# Patient Record
Sex: Female | Born: 1940 | Race: White | Hispanic: No | Marital: Married | Smoking: Former smoker
Health system: Southern US, Community
[De-identification: ages and names within clinical notes are randomized; demographics above are authoritative.]

## PROBLEM LIST (undated history)

## (undated) DIAGNOSIS — D649 Anemia, unspecified: Secondary | ICD-10-CM

## (undated) DIAGNOSIS — Z9109 Other allergy status, other than to drugs and biological substances: Secondary | ICD-10-CM

## (undated) DIAGNOSIS — M069 Rheumatoid arthritis, unspecified: Secondary | ICD-10-CM

## (undated) DIAGNOSIS — M1990 Unspecified osteoarthritis, unspecified site: Secondary | ICD-10-CM

## (undated) DIAGNOSIS — T4145XA Adverse effect of unspecified anesthetic, initial encounter: Secondary | ICD-10-CM

## (undated) DIAGNOSIS — T8859XA Other complications of anesthesia, initial encounter: Secondary | ICD-10-CM

## (undated) DIAGNOSIS — I7300 Raynaud's syndrome without gangrene: Secondary | ICD-10-CM

## (undated) DIAGNOSIS — Z923 Personal history of irradiation: Secondary | ICD-10-CM

## (undated) DIAGNOSIS — R112 Nausea with vomiting, unspecified: Secondary | ICD-10-CM

## (undated) DIAGNOSIS — E7800 Pure hypercholesterolemia, unspecified: Secondary | ICD-10-CM

## (undated) DIAGNOSIS — Z9889 Other specified postprocedural states: Secondary | ICD-10-CM

## (undated) DIAGNOSIS — C801 Malignant (primary) neoplasm, unspecified: Secondary | ICD-10-CM

## (undated) HISTORY — PX: EYE SURGERY: SHX253

## (undated) HISTORY — PX: REPLACEMENT TOTAL KNEE: SUR1224

## (undated) HISTORY — PX: COLONOSCOPY: SHX174

## (undated) HISTORY — PX: SHOULDER ARTHROSCOPY: SHX128

## (undated) HISTORY — PX: BREAST LUMPECTOMY: SHX2

---
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
## 1966-02-21 HISTORY — PX: GANGLION CYST EXCISION: SHX1691

## 1968-02-22 HISTORY — PX: SYMPATHECTOMY: SHX792

## 1970-02-21 HISTORY — PX: ABDOMINAL HYSTERECTOMY: SHX81

## 1997-02-21 HISTORY — PX: BUNIONECTOMY: SHX129

## 2006-08-18 DIAGNOSIS — K589 Irritable bowel syndrome without diarrhea: Secondary | ICD-10-CM

## 2006-08-18 DIAGNOSIS — I7300 Raynaud's syndrome without gangrene: Secondary | ICD-10-CM

## 2006-08-18 HISTORY — DX: Irritable bowel syndrome, unspecified: K58.9

## 2006-08-18 HISTORY — DX: Raynaud's syndrome without gangrene: I73.00

## 2007-07-31 DIAGNOSIS — J309 Allergic rhinitis, unspecified: Secondary | ICD-10-CM

## 2007-07-31 HISTORY — DX: Allergic rhinitis, unspecified: J30.9

## 2008-09-19 DIAGNOSIS — I10 Essential (primary) hypertension: Secondary | ICD-10-CM | POA: Insufficient documentation

## 2008-09-19 HISTORY — DX: Essential (primary) hypertension: I10

## 2009-04-22 DIAGNOSIS — E785 Hyperlipidemia, unspecified: Secondary | ICD-10-CM

## 2009-04-22 HISTORY — DX: Hyperlipidemia, unspecified: E78.5

## 2010-02-02 DIAGNOSIS — G4700 Insomnia, unspecified: Secondary | ICD-10-CM

## 2010-02-02 HISTORY — DX: Insomnia, unspecified: G47.00

## 2012-10-30 ENCOUNTER — Emergency Department (HOSPITAL_BASED_OUTPATIENT_CLINIC_OR_DEPARTMENT_OTHER)
Admission: EM | Admit: 2012-10-30 | Discharge: 2012-10-30 | Disposition: A | Discharge status: Home / Self Care | Payer: Medicare Other | Attending: Emergency Medicine | Admitting: Emergency Medicine

## 2012-10-30 ENCOUNTER — Encounter (HOSPITAL_BASED_OUTPATIENT_CLINIC_OR_DEPARTMENT_OTHER): Payer: Self-pay

## 2012-10-30 DIAGNOSIS — E7800 Pure hypercholesterolemia, unspecified: Secondary | ICD-10-CM | POA: Insufficient documentation

## 2012-10-30 DIAGNOSIS — R110 Nausea: Secondary | ICD-10-CM | POA: Insufficient documentation

## 2012-10-30 DIAGNOSIS — Z79899 Other long term (current) drug therapy: Secondary | ICD-10-CM | POA: Insufficient documentation

## 2012-10-30 DIAGNOSIS — E876 Hypokalemia: Secondary | ICD-10-CM | POA: Insufficient documentation

## 2012-10-30 DIAGNOSIS — I10 Essential (primary) hypertension: Secondary | ICD-10-CM | POA: Insufficient documentation

## 2012-10-30 DIAGNOSIS — R1031 Right lower quadrant pain: Secondary | ICD-10-CM | POA: Insufficient documentation

## 2012-10-30 DIAGNOSIS — Z8739 Personal history of other diseases of the musculoskeletal system and connective tissue: Secondary | ICD-10-CM | POA: Insufficient documentation

## 2012-10-30 DIAGNOSIS — Z87891 Personal history of nicotine dependence: Secondary | ICD-10-CM | POA: Insufficient documentation

## 2012-10-30 DIAGNOSIS — R1013 Epigastric pain: Secondary | ICD-10-CM | POA: Insufficient documentation

## 2012-10-30 DIAGNOSIS — Z880 Allergy status to penicillin: Secondary | ICD-10-CM | POA: Insufficient documentation

## 2012-10-30 HISTORY — DX: Other allergy status, other than to drugs and biological substances: Z91.09

## 2012-10-30 HISTORY — DX: Unspecified osteoarthritis, unspecified site: M19.90

## 2012-10-30 HISTORY — DX: Raynaud's syndrome without gangrene: I73.00

## 2012-10-30 HISTORY — DX: Pure hypercholesterolemia, unspecified: E78.00

## 2012-10-30 HISTORY — DX: Rheumatoid arthritis, unspecified: M06.9

## 2012-10-30 MED ORDER — POTASSIUM CHLORIDE 10 MEQ/100ML IV SOLN
10.0000 meq | Freq: Once | INTRAVENOUS | Status: AC
Start: 2012-10-30 — End: 2012-10-30
  Administered 2012-10-30: 10 meq via INTRAVENOUS
  Filled 2012-10-30: qty 100

## 2012-10-30 MED ORDER — POTASSIUM CHLORIDE CRYS ER 20 MEQ PO TBCR
40.0000 meq | EXTENDED_RELEASE_TABLET | Freq: Once | ORAL | Status: AC
  Administered 2012-10-30: 40 meq via ORAL
  Filled 2012-10-30: qty 2

## 2012-10-30 NOTE — ED Notes (Signed)
Pt sent by PCP for low potassium level. Results not reported. Pt states she was experiencing cramping in her stomach yesterday and went to her PCP.

## 2012-10-30 NOTE — ED Provider Notes (Signed)
CSN: [id number]     Arrival date & time 10/30/12  1149 History   First MD Initiated Contact with Patient 10/30/12 1152     Chief Complaint  Patient presents with  . Abnormal Lab    Low potassium   (Consider location/radiation/quality/duration/timing/severity/associated sxs/prior Treatment) HPI Pt reports she had moderate aching upper abdomen pain and nausea about 3 days ago, seen at PCP office yesterday and had labs and US done. States she is feeling better today. She was called and told her potassium was low and she needed to go to the ED. She denies any weakness, no vomiting since 2 days ago. No diarrhea or dysuria. She recently had a 'cystitis' and took erythromycin she had left over from previous knee operation/infection. Denies any fever. Has had poor appetite.  Past Medical History  Diagnosis Date  . Rheumatoid arthritis   . Osteoarthritis   . Raynaud disease   . High cholesterol   . Hypertension   . Environmental allergies    Past Surgical History  Procedure Laterality Date  . Tonsillectomy    . Appendectomy    . Ganglion cyst excision    . Sympathectomy    . Abdominal hysterectomy    . Bunionectomy    . Replacement total knee      Right and Left   Family History  Problem Relation Age of Onset  . Adopted: Yes   History  Substance Use Topics  . Smoking status: Former Games developer  . Smokeless tobacco: Never Used  . Alcohol Use: 0.6 oz/week    1 Cans of beer per week     Comment: per day   OB History   Grav Para Term Preterm Abortions TAB SAB Ect Mult Living                 Review of Systems All other systems reviewed and are negative except as noted in HPI.   Allergies  Penicillins  Home Medications   Current Outpatient Rx  Name  Route  Sig  Dispense  Refill  . atorvastatin (LIPITOR) 20 MG tablet   Oral   Take 20 mg by mouth daily.         . hydrochlorothiazide (HYDRODIURIL) 25 MG tablet   Oral   Take 25 mg by mouth daily.         Marland Kitchen NIFEdipine  (PROCARDIA) 20 MG capsule   Oral   Take 20 mg by mouth 3 (three) times daily.         Marland Kitchen omeprazole (PRILOSEC) 20 MG capsule   Oral   Take 20 mg by mouth daily.          BP 137/63  Pulse 94  Temp(Src) 97.5 F (36.4 C) (Oral)  Ht 5\' 7"  (1.702 m)  Wt 128 lb (58.06 kg)  BMI 20.04 kg/m2  SpO2 100% Physical Exam  Nursing note and vitals reviewed. Constitutional: She is oriented to person, place, and time. She appears well-developed and well-nourished.  HENT:  Head: Normocephalic and atraumatic.  Eyes: EOM are normal. Pupils are equal, round, and reactive to light.  Neck: Normal range of motion. Neck supple.  Cardiovascular: Normal rate, normal heart sounds and intact distal pulses.   Pulmonary/Chest: Effort normal and breath sounds normal.  Abdominal: Bowel sounds are normal. She exhibits no distension. There is tenderness (mild epigastric and RLQ, no peritoneal signs). There is no rebound and no guarding.  Musculoskeletal: Normal range of motion. She exhibits no edema and no tenderness.  Neurological:  She is alert and oriented to person, place, and time. She has normal strength. No cranial nerve deficit or sensory deficit.  Skin: Skin is warm and dry. No rash noted.  Psychiatric: She has a normal mood and affect.    ED Course  Procedures (including critical care time) Labs Review Labs Reviewed - No data to display Imaging Review No results found.  MDM   1. Hypokalemia     Labs from PCP office sent over and K was 2.8, recommended coming to the ED for "replacement in supervised setting" EKG reviewed and normal. Will give PO and IV. Plan for discharge afterwards, patient has PCP appointment in 2 days. [place] reportedly normal per notes from PCP office.    Date: 10/30/2012  Rate: 69  Rhythm: normal sinus rhythm  QRS Axis: normal  Intervals: normal  ST/T Wave abnormalities: normal  Conduction Disutrbances:none  Narrative Interpretation:   Old EKG Reviewed:  none available     Charles B. Bernette Mayers, MD 10/30/12 1430

## 2012-10-30 NOTE — ED Notes (Signed)
EDP Bernette Mayers reviewed pt's lab results from yesterday-states K+ 2.8

## 2013-11-26 DIAGNOSIS — S32010D Wedge compression fracture of first lumbar vertebra, subsequent encounter for fracture with routine healing: Secondary | ICD-10-CM

## 2013-11-26 HISTORY — DX: Wedge compression fracture of first lumbar vertebra, subsequent encounter for fracture with routine healing: S32.010D

## 2013-12-12 ENCOUNTER — Other Ambulatory Visit: Payer: Self-pay

## 2013-12-12 DIAGNOSIS — Z1231 Encounter for screening mammogram for malignant neoplasm of breast: Secondary | ICD-10-CM

## 2013-12-13 ENCOUNTER — Other Ambulatory Visit: Payer: Self-pay | Admitting: Family Medicine

## 2013-12-13 DIAGNOSIS — S32010A Wedge compression fracture of first lumbar vertebra, initial encounter for closed fracture: Secondary | ICD-10-CM

## 2014-02-07 ENCOUNTER — Ambulatory Visit
Admission: RE | Admit: 2014-02-07 | Discharge: 2014-02-07 | Disposition: A | Discharge status: Home / Self Care | Payer: Medicare Other | Source: Ambulatory Visit | Attending: Family Medicine | Admitting: Family Medicine

## 2014-02-07 ENCOUNTER — Ambulatory Visit
Admission: RE | Admit: 2014-02-07 | Discharge: 2014-02-07 | Disposition: A | Discharge status: Home / Self Care | Payer: Medicare Other | Source: Ambulatory Visit

## 2014-02-07 ENCOUNTER — Encounter (INDEPENDENT_AMBULATORY_CARE_PROVIDER_SITE_OTHER): Payer: Self-pay

## 2014-02-07 DIAGNOSIS — Z1231 Encounter for screening mammogram for malignant neoplasm of breast: Secondary | ICD-10-CM

## 2014-02-07 DIAGNOSIS — S32010A Wedge compression fracture of first lumbar vertebra, initial encounter for closed fracture: Secondary | ICD-10-CM

## 2014-12-15 DIAGNOSIS — C4491 Basal cell carcinoma of skin, unspecified: Secondary | ICD-10-CM

## 2014-12-15 HISTORY — DX: Basal cell carcinoma of skin, unspecified: C44.91

## 2014-12-17 ENCOUNTER — Other Ambulatory Visit: Payer: Self-pay

## 2014-12-17 DIAGNOSIS — Z1231 Encounter for screening mammogram for malignant neoplasm of breast: Secondary | ICD-10-CM

## 2015-02-10 ENCOUNTER — Ambulatory Visit
Admission: RE | Admit: 2015-02-10 | Discharge: 2015-02-10 | Disposition: A | Discharge status: Home / Self Care | Payer: Medicare Other | Source: Ambulatory Visit

## 2015-02-10 DIAGNOSIS — Z1231 Encounter for screening mammogram for malignant neoplasm of breast: Secondary | ICD-10-CM

## 2015-02-22 HISTORY — PX: JOINT REPLACEMENT: SHX530

## 2015-02-27 DIAGNOSIS — M4726 Other spondylosis with radiculopathy, lumbar region: Secondary | ICD-10-CM

## 2015-02-27 DIAGNOSIS — M5136 Other intervertebral disc degeneration, lumbar region: Secondary | ICD-10-CM

## 2015-02-27 DIAGNOSIS — M48061 Spinal stenosis, lumbar region without neurogenic claudication: Secondary | ICD-10-CM

## 2015-02-27 DIAGNOSIS — M51369 Other intervertebral disc degeneration, lumbar region without mention of lumbar back pain or lower extremity pain: Secondary | ICD-10-CM

## 2015-02-27 HISTORY — DX: Other intervertebral disc degeneration, lumbar region: M51.36

## 2015-02-27 HISTORY — DX: Other intervertebral disc degeneration, lumbar region without mention of lumbar back pain or lower extremity pain: M51.369

## 2015-02-27 HISTORY — DX: Other spondylosis with radiculopathy, lumbar region: M47.26

## 2015-02-27 HISTORY — DX: Spinal stenosis, lumbar region without neurogenic claudication: M48.061

## 2015-03-03 DIAGNOSIS — Z96612 Presence of left artificial shoulder joint: Secondary | ICD-10-CM

## 2015-03-03 HISTORY — DX: Presence of left artificial shoulder joint: Z96.612

## 2015-12-28 ENCOUNTER — Other Ambulatory Visit: Payer: Self-pay | Admitting: Family Medicine

## 2015-12-28 DIAGNOSIS — Z1231 Encounter for screening mammogram for malignant neoplasm of breast: Secondary | ICD-10-CM

## 2016-02-11 ENCOUNTER — Ambulatory Visit
Admission: RE | Admit: 2016-02-11 | Discharge: 2016-02-11 | Disposition: A | Discharge status: Home / Self Care | Payer: Medicare Other | Source: Ambulatory Visit | Attending: Family Medicine | Admitting: Family Medicine

## 2016-02-11 DIAGNOSIS — Z1231 Encounter for screening mammogram for malignant neoplasm of breast: Secondary | ICD-10-CM

## 2016-12-29 ENCOUNTER — Other Ambulatory Visit: Payer: Self-pay | Admitting: Family Medicine

## 2016-12-29 DIAGNOSIS — Z780 Asymptomatic menopausal state: Secondary | ICD-10-CM

## 2017-01-16 ENCOUNTER — Other Ambulatory Visit: Payer: Self-pay | Admitting: Family Medicine

## 2017-01-16 DIAGNOSIS — Z139 Encounter for screening, unspecified: Secondary | ICD-10-CM

## 2017-01-21 DIAGNOSIS — C801 Malignant (primary) neoplasm, unspecified: Secondary | ICD-10-CM

## 2017-01-21 HISTORY — DX: Malignant (primary) neoplasm, unspecified: C80.1

## 2017-02-21 HISTORY — PX: BREAST LUMPECTOMY: SHX2

## 2017-02-24 ENCOUNTER — Other Ambulatory Visit: Payer: Self-pay | Admitting: Family Medicine

## 2017-02-24 DIAGNOSIS — E2839 Other primary ovarian failure: Secondary | ICD-10-CM

## 2017-02-27 ENCOUNTER — Ambulatory Visit
Admission: RE | Admit: 2017-02-27 | Discharge: 2017-02-27 | Disposition: A | Discharge status: Home / Self Care | Payer: Medicare Other | Source: Ambulatory Visit | Attending: Family Medicine | Admitting: Family Medicine

## 2017-02-27 DIAGNOSIS — Z139 Encounter for screening, unspecified: Secondary | ICD-10-CM

## 2017-02-27 DIAGNOSIS — E2839 Other primary ovarian failure: Secondary | ICD-10-CM

## 2017-02-28 ENCOUNTER — Other Ambulatory Visit: Payer: Self-pay | Admitting: Family Medicine

## 2017-02-28 DIAGNOSIS — R928 Other abnormal and inconclusive findings on diagnostic imaging of breast: Secondary | ICD-10-CM

## 2017-03-02 ENCOUNTER — Other Ambulatory Visit: Payer: Medicare Other

## 2017-03-03 ENCOUNTER — Ambulatory Visit
Admission: RE | Admit: 2017-03-03 | Discharge: 2017-03-03 | Disposition: A | Discharge status: Home / Self Care | Payer: Medicare Other | Source: Ambulatory Visit | Attending: Family Medicine | Admitting: Family Medicine

## 2017-03-03 ENCOUNTER — Other Ambulatory Visit: Payer: Self-pay | Admitting: Family Medicine

## 2017-03-03 DIAGNOSIS — R928 Other abnormal and inconclusive findings on diagnostic imaging of breast: Secondary | ICD-10-CM

## 2017-03-03 DIAGNOSIS — M8580 Other specified disorders of bone density and structure, unspecified site: Secondary | ICD-10-CM | POA: Insufficient documentation

## 2017-03-03 HISTORY — DX: Other specified disorders of bone density and structure, unspecified site: M85.80

## 2017-03-08 ENCOUNTER — Ambulatory Visit
Admission: RE | Admit: 2017-03-08 | Discharge: 2017-03-08 | Disposition: A | Discharge status: Home / Self Care | Payer: Medicare Other | Source: Ambulatory Visit | Attending: Family Medicine | Admitting: Family Medicine

## 2017-03-08 ENCOUNTER — Other Ambulatory Visit: Payer: Self-pay | Admitting: Family Medicine

## 2017-03-08 DIAGNOSIS — R928 Other abnormal and inconclusive findings on diagnostic imaging of breast: Secondary | ICD-10-CM

## 2017-03-13 ENCOUNTER — Other Ambulatory Visit: Payer: Self-pay | Admitting: Surgery

## 2017-03-13 ENCOUNTER — Ambulatory Visit: Payer: Self-pay | Admitting: Surgery

## 2017-03-13 DIAGNOSIS — Z96653 Presence of artificial knee joint, bilateral: Secondary | ICD-10-CM

## 2017-03-13 DIAGNOSIS — C50912 Malignant neoplasm of unspecified site of left female breast: Secondary | ICD-10-CM

## 2017-03-13 HISTORY — DX: Presence of artificial knee joint, bilateral: Z96.653

## 2017-03-13 NOTE — H&P (Signed)
Heidi Lutz Documented: 03/13/2017 2:23 PM Location: [facility] Patient #: [id number] DOB: 07/01/1940 Married / Language: [name] / Race: White Female  History of Present Illness Marcello Moores A. Kialee Kham MD; 03/13/2017 3:00 PM) Patient words: Patient sent at the request of Dr. Dorise Bullion for mammographic abnormality of left breast. She was noted to have an area in the left breast upper quadrant with increased density. 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 Receptors are pending currently. Patient denies history of breast pain, nipple discharge or breast mass bilaterally. No family history of breast cancer.            CLINICAL DATA: Screening recall for possible mass in the left breast.  EXAM: 2D DIGITAL DIAGNOSTIC LEFT MAMMOGRAM WITH CAD AND ADJUNCT TOMO  ULTRASOUND LEFT BREAST  COMPARISON: Previous exam(s).  ACR Breast Density Category c: The breast tissue is heterogeneously dense, which may obscure small masses.  FINDINGS: In the posterior aspect of the left breast, best seen on the MLO view, there is a focal irregular opacity that persists on the spot-compression 2D images. It is more discretely seen on the spot-compression MLO 3D images where its posterior margin appears spiculated. It measures 5-6 mm in size. It projects laterally.  Mammographic images were processed with CAD.  On physical exam, no discrete mass is palpated in the lateral left breast.  Targeted ultrasound is performed, showing a hypoechoic oval mass with partly ill-defined margins in the left breast at the 3:30 o'clock position, 5 cm the nipple, measuring 8 x 5 x 7 mm, consistent in size, shape and location to the mammographic finding. There is an adjacent simple appearing cyst.  Sonographic evaluation of the left axilla shows no enlarged or abnormal lymph nodes.  IMPRESSION: 1.  Small left breast mass suspicious for breast malignancy. Biopsy is indicated.  RECOMMENDATION: Ultrasound-guided core needle biopsy of the small lateral left breast mass.  I have discussed the findings and recommendations with the patient. Results were also provided in writing at the conclusion of the visit. If applicable, a reminder letter will be sent to the patient regarding the next appointment.  BI-RADS CATEGORY 4: Suspicious.   Electronically Signed By: [name] M.D. On: 03/03/2017 14:23      ADDITIONAL INFORMATION: E-cadherin is positive supporting a ductal phenotype. [name] MD Pathologist, Electronic Signature ( Signed 03/10/2017) FINAL DIAGNOSIS Diagnosis Breast, left, needle core biopsy, lower outer - INVASIVE MAMMARY CARCINOMA, SEE COMMENT. Microscopic Comment The carcinoma appears grade 1. E-cadherin will be ordered. Prognostic markers will be ordered. Dr. Lyndon Code has reviewed the case. The case was called to [facility] on 03/09/2017. [name] MD Pathologist, Electronic Signature (Case signed 03/09/2017) Specimen Gross and Clinical Information Specimen Comment Time in formalin: 4:10 PM; extracted less than 1 minute; mass Specimen(s) Obtained: Breast, left, needle core biopsy, lower outer Specimen Clinical Information.  The patient is a 77 year old female.   Past Surgical History ([name], [place]; 03/13/2017 2:23 PM) Appendectomy Breast Biopsy Bilateral. Cataract Surgery Bilateral. Foot Surgery Left. Hysterectomy (not due to cancer) - Complete Knee Surgery Bilateral. Shoulder Surgery Left. Spinal Surgery - Lower Back Tonsillectomy  Diagnostic Studies History ([name], [place]; 03/13/2017 2:23 PM) Colonoscopy 1-5 years ago Mammogram within last year Pap Smear >5 years ago  Allergies ([name], [place]; 03/13/2017 2:25 PM) Penicillins All Cillins Allergies Reconciled  Medication  History [name]  [name], [name]; 03/13/2017 2:28 PM) Atorvastatin Calcium (20MG  Tablet, Oral) Active. NIFEdipine ER (90MG  Tablet ER 24HR, Oral) Active. Centrum Women (Oral) Active. Calcium (250MG  Tablet, Oral) Active. Magnesium (250MG  Tablet, Oral) Active. Black Cohosh (540MG  Capsule, Oral) Active. ZyrTEC Allergy (10MG  Tablet, Oral) Active. Probiotic (Oral) Active. Medications Reconciled  Social History ([name], [name]; 03/13/2017 2:23 PM) Alcohol use Moderate alcohol use. Caffeine use Carbonated beverages, Coffee. No drug use Tobacco use Former smoker.  Family History ([name], [place]; 03/13/2017 2:23 PM) Family history unknown First Degree Relatives  Pregnancy / Birth History ([name], [place]; 03/13/2017 2:23 PM) Age at menarche 18 years. Age of menopause <44 Gravida 2 Maternal age 96-20 Para 2  Other Problems ([name], [place]; 03/13/2017 2:23 PM) Arthritis Back Pain Breast Cancer Lump In Breast Melanoma     Review of Systems (Tanisha A. Brown [name]; 03/13/2017 2:23 PM) General Not Present- Appetite Loss, Chills, Fatigue, Fever, Night Sweats, Weight Gain and Weight Loss. Skin Not Present- Change in Wart/Mole, Dryness, Hives, Jaundice, New Lesions, Non-Healing Wounds, Rash and Ulcer. HEENT Not Present- Earache, Hearing Loss, Hoarseness, Nose Bleed, Oral Ulcers, Ringing in the Ears, Seasonal Allergies, Sinus Pain, Sore Throat, Visual Disturbances, Wears glasses/contact lenses and Yellow Eyes. Respiratory Not Present- Bloody sputum, Chronic Cough, Difficulty Breathing, Snoring and Wheezing. Cardiovascular Not Present- Chest Pain, Difficulty Breathing Lying Down, Leg Cramps, Palpitations, Rapid Heart Rate, Shortness of Breath and Swelling of Extremities. Gastrointestinal Not Present- Abdominal Pain, Bloating, Bloody Stool, Change in Bowel Habits, Chronic diarrhea, Constipation, Difficulty Swallowing, Excessive gas, Gets full quickly  at meals, Hemorrhoids, Indigestion, Nausea, Rectal Pain and Vomiting. Female Genitourinary Not Present- Frequency, Nocturia, Painful Urination, Pelvic Pain and Urgency. Musculoskeletal Present- Back Pain and Joint Pain. Not Present- Joint Stiffness, Muscle Pain, Muscle Weakness and Swelling of Extremities. Neurological Not Present- Decreased Memory, Fainting, Headaches, Numbness, Seizures, Tingling, Tremor, Trouble walking and Weakness. Psychiatric Not Present- Anxiety, Bipolar, Change in Sleep Pattern, Depression, Fearful and Frequent crying. Endocrine Not Present- Cold Intolerance, Excessive Hunger, Hair Changes, Heat Intolerance, Hot flashes and New Diabetes. Hematology Not Present- Blood Thinners, Easy Bruising, Excessive bleeding, Gland problems, HIV and Persistent Infections.  Vitals (Tanisha A. Brown [name]; 03/13/2017 2:25 PM) 03/13/2017 2:24 PM Weight: 137.8 lb Height: 63in Body Surface Area: 1.65 m Body Mass Index: 24.41 kg/m  Temp.: 97.69F  Pulse: 78 (Regular)  BP: 128/82 (Sitting, Left Arm, Standard)      Physical Exam (Hank Walling A. Travonta Gill MD; 03/13/2017 3:01 PM)  General Mental Status-Alert. General Appearance-Consistent with stated age. Hydration-Well hydrated. Voice-Normal.  Head and Neck Head-normocephalic, atraumatic with no lesions or palpable masses. Trachea-midline. Thyroid Gland Characteristics - normal size and consistency.  Chest and Lung Exam Chest and lung exam reveals -quiet, even and easy respiratory effort with no use of accessory muscles and on auscultation, normal breath sounds, no adventitious sounds and normal vocal resonance. Inspection Chest Wall - Normal. Back - normal.  Breast Breast - Left-Symmetric, Non Tender, No Biopsy scars, no Dimpling, No Inflammation, No Lumpectomy scars, No Mastectomy scars, No Peau d' Orange. Breast - Right-Symmetric, Non Tender, No Biopsy scars, no Dimpling, No Inflammation, No Lumpectomy  scars, No Mastectomy scars, No Peau d' Orange. Breast Lump-No Palpable Breast Mass. Note: Steri-Strips left breast noted  Neurologic Neurologic evaluation reveals -alert and oriented x 3 with no impairment of recent or remote memory. Mental Status-Normal.  Musculoskeletal Normal Exam - Left-Upper Extremity Strength Normal and Lower Extremity Strength Normal. Normal Exam - Right-Upper Extremity Strength  Normal and Lower Extremity Strength Normal.  Lymphatic Head & Neck  General Head & Neck Lymphatics: Bilateral - Description - Normal. Axillary  General Axillary Region: Bilateral - Description - Normal. Tenderness - Non Tender.    Assessment & Plan (Merwyn Hodapp A. Annebelle Bostic MD; 03/13/2017 2:59 PM)  BREAST CANCER, LEFT (C50.912) Impression: Upper-outer quadrant  Discussed lumpectomy versus mastectomy reconstruction. Patient has opted for breast conservation. Discussed CT localization of left breast lumpectomy and sentinel lymph node mapping as well to pros and cons the controversies currently with women age 20. After discussion of pros and cons of sentinel lymph node mapping, she agreed to proceed to that as well as lumpectomy. Risk of lumpectomy include bleeding, infection, seroma, more surgery, use of seed/wire, wound care, cosmetic deformity and the need for other treatments, death , blood clots, death. Pt agrees to proceed. Risk of sentinel lymph node mapping include bleeding, infection, lymphedema, shoulder pain. stiffness, dye allergy. cosmetic deformity , blood clots, death, need for more surgery. Pt agres to proceed.  Current Plans You are being scheduled for surgery- Our schedulers will call you.  You should hear from our office's scheduling department within 5 working days about the location, date, and time of surgery. We try to make accommodations for patient's preferences in scheduling surgery, but sometimes the OR schedule or the surgeon's schedule prevents [place] from  making those accommodations.  If you have not heard from our office [phone]) in 5 working days, call the office and ask for your surgeon's nurse.  If you have other questions about your diagnosis, plan, or surgery, call the office and ask for your surgeon's nurse.  Pt Education - [facility] Breast Cancer Information Given - [facility] "Breast Journey" Package We discussed the staging and pathophysiology of breast cancer. We discussed all of the different options for treatment for breast cancer including surgery, chemotherapy, radiation therapy, Herceptin, and antiestrogen therapy. We discussed a sentinel lymph node biopsy as she does not appear to having lymph node involvement right now. We discussed the performance of that with injection of radioactive tracer and blue dye. We discussed that she would have an incision underneath her axillary hairline. We discussed that there is a bout a 10-20% chance of having a positive node with a sentinel lymph node biopsy and we will await the permanent pathology to make any other first further decisions in terms of her treatment. One of these options might be to return to the operating room to perform an axillary lymph node dissection. We discussed about a 1-2% risk lifetime of chronic shoulder pain as well as lymphedema associated with a sentinel lymph node biopsy. We discussed the options for treatment of the breast cancer which included lumpectomy versus a mastectomy. We discussed the performance of the lumpectomy with a wire placement. We discussed a 10-20% chance of a positive margin requiring reexcision in the operating room. We also discussed that she may need radiation therapy or antiestrogen therapy or both if she undergoes lumpectomy. We discussed the mastectomy and the postoperative care for that as well. We discussed that there is no difference in her survival whether she undergoes lumpectomy with radiation therapy or antiestrogen therapy versus a mastectomy.  There is a slight difference in the local recurrence rate being 3-5% with lumpectomy and about 1% with a mastectomy. We discussed the risks of operation including bleeding, infection, possible reoperation. She understands her further therapy will be based on what her stages at the time of her operation.  Pt Education -  [facility] breast cancer surgery: discussed with patient and provided information. Pt Education - [facility] Breast Biopsy HCI: discussed with patient and provided information.

## 2017-03-13 NOTE — H&P (View-Only) (Signed)
Heidi Lutz Documented: 03/13/2017 2:23 PM Location: [facility] Patient #: [id number] DOB: 22-May-1940 Married / Language: [name] / Race: White Female  History of Present Illness Heidi Lutz A. Heidi Lutz Matera Lutz; 03/13/2017 3:00 PM) Patient words: Patient sent at Heidi request of Dr. Dorise Lutz for mammographic abnormality of left breast. She was noted to have an area in Heidi left breast upper quadrant with increased density. 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 Receptors are pending currently. Patient denies history of breast pain, nipple discharge or breast mass bilaterally. No family history of breast cancer.            CLINICAL DATA: Screening recall for possible mass in Heidi left breast.  EXAM: 2D DIGITAL DIAGNOSTIC LEFT MAMMOGRAM WITH CAD AND ADJUNCT TOMO  ULTRASOUND LEFT BREAST  COMPARISON: Previous exam(s).  ACR Breast Density Category c: Heidi breast tissue is heterogeneously dense, which may obscure small masses.  FINDINGS: In Heidi posterior aspect of Heidi left breast, best seen on Heidi MLO view, there is a focal irregular opacity that persists on Heidi spot-compression 2D images. It is more discretely seen on Heidi spot-compression MLO 3D images where its posterior margin appears spiculated. It measures 5-6 mm in size. It projects laterally.  Mammographic images were processed with CAD.  On physical exam, no discrete mass is palpated in Heidi lateral left breast.  Targeted ultrasound is performed, showing a hypoechoic oval mass with partly ill-defined margins in Heidi left breast at Heidi 3:30 o'clock position, 5 cm Heidi nipple, measuring 8 x 5 x 7 mm, consistent in size, shape and location to Heidi mammographic finding. There is an adjacent simple appearing cyst.  Sonographic evaluation of Heidi left axilla shows no enlarged or abnormal lymph nodes.  IMPRESSION: 1.  Small left breast mass suspicious for breast malignancy. Biopsy is indicated.  RECOMMENDATION: Ultrasound-guided core needle biopsy of Heidi small lateral left breast mass.  I have discussed Heidi findings and recommendations with Heidi patient. Results were also provided in writing at Heidi conclusion of Heidi visit. If applicable, a reminder letter will be sent to Heidi patient regarding Heidi next appointment.  BI-RADS CATEGORY 4: Suspicious.   Electronically Signed By: Heidi Lutz M.D. On: 03/03/2017 14:23      ADDITIONAL INFORMATION: E-cadherin is positive supporting a ductal phenotype. Heidi Lutz Pathologist, Electronic Signature ( Signed 03/10/2017) FINAL DIAGNOSIS Diagnosis Breast, left, needle core biopsy, lower outer - INVASIVE MAMMARY CARCINOMA, SEE COMMENT. Microscopic Comment Heidi carcinoma appears grade 1. E-cadherin will be ordered. Prognostic markers will be ordered. Dr. Lyndon Lutz has reviewed Heidi case. Heidi case was called to Heidi Lutz on 03/09/2017. Heidi Lutz Pathologist, Electronic Signature (Case signed 03/09/2017) Specimen Gross and Clinical Information Specimen Comment Time in formalin: 4:10 PM; extracted less than 1 minute; mass Specimen(s) Obtained: Breast, left, needle core biopsy, lower outer Specimen Clinical Information.  Heidi patient is a 77 year old female.   Past Surgical History (Heidi Lutz, [place]; 03/13/2017 2:23 PM) Appendectomy Breast Biopsy Bilateral. Cataract Surgery Bilateral. Foot Surgery Left. Hysterectomy (not due to cancer) - Complete Knee Surgery Bilateral. Shoulder Surgery Left. Spinal Surgery - Lower Back Tonsillectomy  Diagnostic Studies History (Heidi Lutz, [place]; 03/13/2017 2:23 PM) Colonoscopy 1-5 years ago Mammogram within last year Pap Smear >5 years ago  Allergies (Heidi Lutz, [place]; 03/13/2017 2:25 PM) Penicillins All Cillins Allergies Reconciled  Medication  History Heidi Lutz  Heidi Lutz, Heidi Lutz; 03/13/2017 2:28 PM) Atorvastatin Calcium (20MG  Tablet, Oral) Active. NIFEdipine ER (90MG  Tablet ER 24HR, Oral) Active. Centrum Women (Oral) Active. Calcium (250MG  Tablet, Oral) Active. Magnesium (250MG  Tablet, Oral) Active. Black Cohosh (540MG  Capsule, Oral) Active. ZyrTEC Allergy (10MG  Tablet, Oral) Active. Probiotic (Oral) Active. Medications Reconciled  Social History (Heidi Lutz, Heidi Lutz; 03/13/2017 2:23 PM) Alcohol use Moderate alcohol use. Caffeine use Carbonated beverages, Coffee. No drug use Tobacco use Former smoker.  Family History (Heidi Lutz, [place]; 03/13/2017 2:23 PM) Family history unknown First Degree Relatives  Pregnancy / Birth History (Heidi Lutz, [place]; 03/13/2017 2:23 PM) Age at menarche 1 years. Age of menopause <44 Gravida 2 Maternal age 38-20 Para 2  Other Problems (Heidi Lutz, [place]; 03/13/2017 2:23 PM) Arthritis Back Pain Breast Cancer Lump In Breast Melanoma     Review of Systems (Heidi Lutz Heidi Lutz; 03/13/2017 2:23 PM) General Not Present- Appetite Loss, Chills, Fatigue, Fever, Night Sweats, Weight Gain and Weight Loss. Skin Not Present- Change in Wart/Mole, Dryness, Hives, Jaundice, New Lesions, Non-Healing Wounds, Rash and Ulcer. HEENT Not Present- Earache, Hearing Loss, Hoarseness, Nose Bleed, Oral Ulcers, Ringing in Heidi Ears, Seasonal Allergies, Sinus Pain, Sore Throat, Visual Disturbances, Wears glasses/contact lenses and Yellow Eyes. Respiratory Not Present- Bloody sputum, Chronic Cough, Difficulty Breathing, Snoring and Wheezing. Cardiovascular Not Present- Chest Pain, Difficulty Breathing Lying Down, Leg Cramps, Palpitations, Rapid Heart Rate, Shortness of Breath and Swelling of Extremities. Gastrointestinal Not Present- Abdominal Pain, Bloating, Bloody Stool, Change in Bowel Habits, Chronic diarrhea, Constipation, Difficulty Swallowing, Excessive gas, Gets full quickly  at meals, Hemorrhoids, Indigestion, Nausea, Rectal Pain and Vomiting. Female Genitourinary Not Present- Frequency, Nocturia, Painful Urination, Pelvic Pain and Urgency. Musculoskeletal Present- Back Pain and Joint Pain. Not Present- Joint Stiffness, Muscle Pain, Muscle Weakness and Swelling of Extremities. Neurological Not Present- Decreased Memory, Fainting, Headaches, Numbness, Seizures, Tingling, Tremor, Trouble walking and Weakness. Psychiatric Not Present- Anxiety, Bipolar, Change in Sleep Pattern, Depression, Fearful and Frequent crying. Endocrine Not Present- Cold Intolerance, Excessive Hunger, Hair Changes, Heat Intolerance, Hot flashes and New Diabetes. Hematology Not Present- Blood Thinners, Easy Bruising, Excessive bleeding, Gland problems, HIV and Persistent Infections.  Vitals (Heidi Lutz Heidi Lutz; 03/13/2017 2:25 PM) 03/13/2017 2:24 PM Weight: 137.8 lb Height: 63in Body Surface Area: 1.65 m Body Mass Index: 24.41 kg/m  Temp.: 97.45F  Pulse: 78 (Regular)  BP: 128/82 (Sitting, Left Arm, Standard)      Physical Exam (Waldon Sheerin A. Cassi Jenne Lutz; 03/13/2017 3:01 PM)  General Mental Status-Alert. General Appearance-Consistent with stated age. Hydration-Well hydrated. Voice-Normal.  Head and Neck Head-normocephalic, atraumatic with no lesions or palpable masses. Trachea-midline. Thyroid Gland Characteristics - normal size and consistency.  Chest and Lung Exam Chest and lung exam reveals -quiet, even and easy respiratory effort with no use of accessory muscles and on auscultation, normal breath sounds, no adventitious sounds and normal vocal resonance. Inspection Chest Wall - Normal. Back - normal.  Breast Breast - Left-Symmetric, Non Tender, No Biopsy scars, no Dimpling, No Inflammation, No Lumpectomy scars, No Mastectomy scars, No Peau d' Orange. Breast - Right-Symmetric, Non Tender, No Biopsy scars, no Dimpling, No Inflammation, No Lumpectomy  scars, No Mastectomy scars, No Peau d' Orange. Breast Lump-No Palpable Breast Mass. Note: Steri-Strips left breast noted  Neurologic Neurologic evaluation reveals -alert and oriented x 3 with no impairment of recent or remote memory. Mental Status-Normal.  Musculoskeletal Normal Exam - Left-Upper Extremity Strength Normal and Lower Extremity Strength Normal. Normal Exam - Right-Upper Extremity Strength  Normal and Lower Extremity Strength Normal.  Lymphatic Head & Neck  General Head & Neck Lymphatics: Bilateral - Description - Normal. Axillary  General Axillary Region: Bilateral - Description - Normal. Tenderness - Non Tender.    Assessment & Plan (Deaunte Dente A. Kayde Warehime Lutz; 03/13/2017 2:59 PM)  BREAST CANCER, LEFT (C50.912) Impression: Upper-outer quadrant  Discussed lumpectomy versus mastectomy reconstruction. Patient has opted for breast conservation. Discussed CT localization of left breast lumpectomy and sentinel lymph node mapping as well to pros and cons Heidi controversies currently with women age 29. After discussion of pros and cons of sentinel lymph node mapping, she agreed to proceed to that as well as lumpectomy. Risk of lumpectomy include bleeding, infection, seroma, more surgery, use of seed/wire, wound care, cosmetic deformity and Heidi need for other treatments, death , blood clots, death. Pt agrees to proceed. Risk of sentinel lymph node mapping include bleeding, infection, lymphedema, shoulder pain. stiffness, dye allergy. cosmetic deformity , blood clots, death, need for more surgery. Pt agres to proceed.  Current Plans You are being scheduled for surgery- Our schedulers will call you.  You should hear from our office's scheduling department within 5 working days about Heidi location, date, and time of surgery. We try to make accommodations for patient's preferences in scheduling surgery, but sometimes Heidi OR schedule or Heidi surgeon's schedule prevents [place] from  making those accommodations.  If you have not heard from our office [phone]) in 5 working days, call Heidi office and ask for your surgeon's nurse.  If you have other questions about your diagnosis, plan, or surgery, call Heidi office and ask for your surgeon's nurse.  Pt Education - [facility] Breast Cancer Information Given - [facility] "Breast Journey" Package We discussed Heidi staging and pathophysiology of breast cancer. We discussed all of Heidi different options for treatment for breast cancer including surgery, chemotherapy, radiation therapy, Herceptin, and antiestrogen therapy. We discussed a sentinel lymph node biopsy as she does not appear to having lymph node involvement right now. We discussed Heidi performance of that with injection of radioactive tracer and blue dye. We discussed that she would have an incision underneath her axillary hairline. We discussed that there is a bout a 10-20% chance of having a positive node with a sentinel lymph node biopsy and we will await Heidi permanent pathology to make any other first further decisions in terms of her treatment. One of these options might be to return to Heidi operating room to perform an axillary lymph node dissection. We discussed about a 1-2% risk lifetime of chronic shoulder pain as well as lymphedema associated with a sentinel lymph node biopsy. We discussed Heidi options for treatment of Heidi breast cancer which included lumpectomy versus a mastectomy. We discussed Heidi performance of Heidi lumpectomy with a wire placement. We discussed a 10-20% chance of a positive margin requiring reexcision in Heidi operating room. We also discussed that she may need radiation therapy or antiestrogen therapy or both if she undergoes lumpectomy. We discussed Heidi mastectomy and Heidi postoperative care for that as well. We discussed that there is no difference in her survival whether she undergoes lumpectomy with radiation therapy or antiestrogen therapy versus a mastectomy.  There is a slight difference in Heidi local recurrence rate being 3-5% with lumpectomy and about 1% with a mastectomy. We discussed Heidi risks of operation including bleeding, infection, possible reoperation. She understands her further therapy will be based on what her stages at Heidi time of her operation.  Pt Education -  [facility] breast cancer surgery: discussed with patient and provided information. Pt Education - [facility] Breast Biopsy HCI: discussed with patient and provided information.

## 2017-03-15 ENCOUNTER — Encounter (HOSPITAL_BASED_OUTPATIENT_CLINIC_OR_DEPARTMENT_OTHER): Payer: Self-pay | Admitting: *Deleted

## 2017-03-15 ENCOUNTER — Encounter: Payer: Self-pay | Admitting: Radiation Oncology

## 2017-03-15 ENCOUNTER — Other Ambulatory Visit: Payer: Self-pay

## 2017-03-15 NOTE — Progress Notes (Signed)
Location of Breast Cancer: Left Breast  Histology per Pathology Report:  03/08/17 Diagnosis Breast, left, needle core biopsy, lower outer - INVASIVE MAMMARY CARCINOMA, SEE COMMENT.  Receptor Status: ER(100%), PR (70%), Her2-neu (NEG), Ki-(2%)  Did patient present with symptoms or was this found on screening mammography?: It was found on a screening mammogram.   Past/Anticipated interventions by surgeon, if any: Surgery scheduled for 03/21/17- Dr. Cornett  Past/Anticipated interventions by medical oncology, if any: No appointment scheduled.   Lymphedema issues, if any:  N/A  Pain issues, if any: She denies  SAFETY ISSUES:  Prior radiation? No  Pacemaker/ICD? No  Possible current pregnancy? N/A  Is the patient on methotrexate? No  Current Complaints / other details:    BP 138/82   Pulse 77   Temp 97.8 F (36.6 C)   Ht 5' 3" (1.6 m)   Wt 135 lb 12.8 oz (61.6 kg)   SpO2 100% Comment: room air  BMI 24.06 kg/m    Wt Readings from Last 3 Encounters:  03/16/17 135 lb 12.8 oz (61.6 kg)  10/30/12 128 lb (58.1 kg)      Malmfelt, Jennifer L, RN 03/15/2017,1:51 PM   

## 2017-03-16 ENCOUNTER — Encounter (HOSPITAL_BASED_OUTPATIENT_CLINIC_OR_DEPARTMENT_OTHER)
Admission: RE | Admit: 2017-03-16 | Discharge: 2017-03-16 | Disposition: A | Discharge status: Home / Self Care | Payer: Medicare Other | Source: Ambulatory Visit | Attending: Surgery | Admitting: Surgery

## 2017-03-16 ENCOUNTER — Telehealth: Payer: Self-pay | Admitting: Oncology

## 2017-03-16 ENCOUNTER — Encounter: Payer: Self-pay | Admitting: *Deleted

## 2017-03-16 ENCOUNTER — Ambulatory Visit
Admission: RE | Admit: 2017-03-16 | Discharge: 2017-03-16 | Disposition: A | Discharge status: Home / Self Care | Payer: Medicare Other | Source: Ambulatory Visit | Attending: Radiation Oncology | Admitting: Radiation Oncology

## 2017-03-16 ENCOUNTER — Encounter (HOSPITAL_BASED_OUTPATIENT_CLINIC_OR_DEPARTMENT_OTHER): Payer: Self-pay | Admitting: Anesthesiology

## 2017-03-16 ENCOUNTER — Encounter: Payer: Self-pay | Admitting: Radiation Oncology

## 2017-03-16 ENCOUNTER — Encounter: Payer: Self-pay | Admitting: General Practice

## 2017-03-16 VITALS — BP 138/82 | HR 77 | Temp 97.8°F | Ht 63.0 in | Wt 135.8 lb

## 2017-03-16 DIAGNOSIS — E7800 Pure hypercholesterolemia, unspecified: Secondary | ICD-10-CM | POA: Insufficient documentation

## 2017-03-16 DIAGNOSIS — C50112 Malignant neoplasm of central portion of left female breast: Secondary | ICD-10-CM | POA: Insufficient documentation

## 2017-03-16 DIAGNOSIS — Z01810 Encounter for preprocedural cardiovascular examination: Secondary | ICD-10-CM | POA: Insufficient documentation

## 2017-03-16 DIAGNOSIS — I7300 Raynaud's syndrome without gangrene: Secondary | ICD-10-CM | POA: Diagnosis not present

## 2017-03-16 DIAGNOSIS — I447 Left bundle-branch block, unspecified: Secondary | ICD-10-CM | POA: Diagnosis not present

## 2017-03-16 DIAGNOSIS — Z7982 Long term (current) use of aspirin: Secondary | ICD-10-CM | POA: Diagnosis not present

## 2017-03-16 DIAGNOSIS — Z885 Allergy status to narcotic agent status: Secondary | ICD-10-CM | POA: Insufficient documentation

## 2017-03-16 DIAGNOSIS — Z170 Estrogen receptor positive status [ER+]: Secondary | ICD-10-CM | POA: Insufficient documentation

## 2017-03-16 DIAGNOSIS — Z880 Allergy status to penicillin: Secondary | ICD-10-CM | POA: Insufficient documentation

## 2017-03-16 DIAGNOSIS — Z87891 Personal history of nicotine dependence: Secondary | ICD-10-CM | POA: Diagnosis not present

## 2017-03-16 DIAGNOSIS — M069 Rheumatoid arthritis, unspecified: Secondary | ICD-10-CM | POA: Diagnosis not present

## 2017-03-16 DIAGNOSIS — Z79899 Other long term (current) drug therapy: Secondary | ICD-10-CM | POA: Diagnosis not present

## 2017-03-16 HISTORY — DX: Malignant neoplasm of central portion of left female breast: Z17.0

## 2017-03-16 LAB — BASIC METABOLIC PANEL
Anion gap: 12 (ref 5–15)
BUN: 23 mg/dL — ABNORMAL HIGH (ref 6–20)
CALCIUM: 9.1 mg/dL (ref 8.9–10.3)
CHLORIDE: 102 mmol/L (ref 101–111)
CO2: 25 mmol/L (ref 22–32)
CREATININE: 1.24 mg/dL — AB (ref 0.44–1.00)
GFR, EST AFRICAN AMERICAN: 47 mL/min — AB (ref >60)
GFR, EST NON AFRICAN AMERICAN: 41 mL/min — AB (ref >60)
Glucose, Bld: 88 mg/dL (ref 65–99)
Potassium: 4.4 mmol/L (ref 3.5–5.1)
SODIUM: 139 mmol/L (ref 135–145)

## 2017-03-16 NOTE — Progress Notes (Signed)
[facility]  [phone] [phone]     ID: Heidi Lutz DOB: [birth year]  MR#: [id number]  PPJ#:[id number]  Patient Care Team: Katherina Mires, MD as PCP - General (Family Medicine) Magrinat, [name], MD as Consulting Physician (Oncology) Jovita Kussmaul, MD as Consulting Physician (General Surgery) OTHER MD:  CHIEF COMPLAINT: Estrogen receptor positive breast cancer  CURRENT TREATMENT: Awaiting definitive surgery   HISTORY OF CURRENT ILLNESS: Heidi Lutz had routine screening mammography on 02/27/2017 showing a possible asymmetry in the left breast. She underwent unilateral diagnostic mammography with tomography and left breast ultrasonography at [facility] on 03/03/2017 showing: breast density category C. Small left breast mass in the 3:30 o'clock lower outer position 5 cm from the nipple measuring 0.8 x 0.5 x 0.7 cm is suspicious for breast malignancy. The left axilla is negative for lymphadenopathy.   Accordingly on 03/08/2016 she proceeded to biopsy of the left breast mass in question. The pathology from this procedure showed ([id number]): invasive mammary carcinoma. 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 Prognostic indicators significant for: estrogen receptor, 100% positive and progesterone receptor, 70% positive, both with strong staining intensity. Proliferation marker Ki67 at 2%. HER2 not amplified with ratio HER2/CEP17 signals 1.39 and average HER2 copies per cell 1.95.  The patient's subsequent history is as detailed below.  INTERVAL HISTORY: Heidi Lutz was evaluated in the breast cancer clinic on 03/17/2017 accompanied by her husband Heidi Lutz and her daughter Heidi Lutz.. Her case was also presented at the multidisciplinary breast cancer conference on 03/15/2017. At that time a preliminary plan was proposed: Breast conserving surgery with sentinel lymph node sampling, consideration of Oncotype versus simply opting for  antiestrogens; consideration of adjuvant radiation in addition to antiestrogens   REVIEW OF SYSTEMS: There were no specific symptoms leading to the original mammogram, which was routinely scheduled. The patient denies unusual headaches, visual changes, nausea, vomiting, stiff neck, dizziness, or gait imbalance. There has been no cough, phlegm production, or pleurisy, no chest pain or pressure, and no change in bowel or bladder habits. The patient denies fever, rash, bleeding, unexplained fatigue or unexplained weight loss.  She exercises regularly at the gym and through the programs available through her retirement community A detailed review of systems was otherwise entirely negative.    PAST MEDICAL HISTORY: Past Medical History:  Diagnosis Date  . Cancer ([place]) 01/2017   left breast cancer  . Complication of anesthesia   . Environmental allergies   . High cholesterol   . Osteoarthritis   . PONV (postoperative nausea and vomiting)   . Raynaud disease    Raynauds disease-takes procardia  . Rheumatoid arthritis ([place])     PAST SURGICAL HISTORY: Past Surgical History:  Procedure Laterality Date  . ABDOMINAL HYSTERECTOMY    . APPENDECTOMY    . BUNIONECTOMY    . EYE SURGERY     bil cataract  . GANGLION CYST EXCISION    . JOINT REPLACEMENT Left 2017    reverse shoulder replacement  . REPLACEMENT TOTAL KNEE     Right and Left  . SHOULDER ARTHROSCOPY Left   . SYMPATHECTOMY    . TONSILLECTOMY      FAMILY HISTORY Family History  Adopted: Yes  Problem Relation Age of Onset  . Breast cancer Neg Hx   The patient is adopted and has no information regarding her biologic family.  GYNECOLOGIC HISTORY:  No LMP recorded. Patient has had a hysterectomy. Menarche: X years old Age at first live  birth: 77 years old [place] P 2 LMP status post hysterectomy at age 14 Contraceptiven/a HRT yes, 5 years, including progesterone  SO?  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  Daughter Heidi Lutz is a Scientist, research (physical sciences) in [employer].  Daughter Heidi Lutz is an Location manager in [place].  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 Smoking status: Former Smoker    Last attempt to quit: 02/22/1963    Years since quitting: 54.1  . Smokeless tobacco: Never Used  Substance Use Topics  . Alcohol use: Yes    Alcohol/week: 8.4 oz    Types: 14 Glasses of wine per week    Comment: 2 glasses of wine daily.   . Drug use: No     Colonoscopy: 2014  PAP: Status post hysterectomy  Bone density: 02/27/2017 showed a T scored of -1.8 osteopenia    Allergies  Allergen Reactions  . Fluorouracil Hives  . Codeine Nausea And Vomiting  . Hydrocodone Nausea And Vomiting  . Penicillins Rash and Other (See Comments)    Pt states allergic to all "cillin" drugs. Joint pain.    Current Outpatient Medications  Medication Sig Dispense Refill  . acetaminophen (TYLENOL) 500 MG tablet Take by mouth.    Marland Kitchen aspirin EC 81 MG tablet Take 81 mg by mouth daily.    Marland Kitchen atorvastatin (LIPITOR) 20 MG tablet Take 20 mg by mouth daily.    . Biotin (SUPER BIOTIN) 5 MG TABS Take by mouth.    . Black Cohosh 540 MG CAPS Take by mouth.    . calcium-vitamin D (OSCAL WITH D) 500-200 MG-UNIT TABS tablet Take by mouth.    . cetirizine (ZYRTEC) 10 MG tablet Take 10 mg by mouth daily.    . Cholecalciferol (VITAMIN D) 2000 units CAPS Take by mouth.    . co-enzyme Q-10 50 MG capsule Take by mouth.    . Cranberry 500 MG CAPS Take 4,200 mg by mouth.    . dicyclomine (BENTYL) 10 MG capsule TK 1 C PO BID  1  . Glucosamine-Chondroit-Vit C-Mn (GLUCOSAMINE-CHONDROITIN MAX ST) CAPS Take by mouth.    . Magnesium 200 MG TABS Take by mouth.    . Multiple Vitamins-Minerals (CENTRUM ADULTS PO) Take by mouth.    Marland Kitchen NIFEdipine  (PROCARDIA XL/ADALAT-CC) 90 MG 24 hr tablet Take 90 mg by mouth daily.     . Probiotic Product (PROBIOTIC ADVANCED PO) Take by mouth.    . traMADol (ULTRAM) 50 MG tablet     . zolpidem (AMBIEN) 5 MG tablet   0   No current facility-administered medications for this visit.     OBJECTIVE: Middle-aged white woman who appears well  Vitals:   03/17/17 1359  BP: 136/86  Pulse: (!) 103  Resp: 20  Temp: (!) 97.3 F (36.3 C)  SpO2: 100%     Body mass index is 24.34 kg/m.   Wt Readings from Last 3 Encounters:  03/17/17 137 lb 6.4 oz (62.3 kg)  03/16/17 135 lb 12.8 oz (61.6 kg)  10/30/12 128 lb (58.1 kg)      ECOG FS:0 - Asymptomatic  Ocular: Sclerae unicteric, pupils round and equal Ear-nose-throat: Oropharynx clear and moist Lymphatic: No cervical or supraclavicular adenopathy Lungs no rales or rhonchi Heart  regular rate and rhythm Abd soft, nontender, positive bowel sounds MSK no focal spinal tenderness, no joint edema Neuro: non-focal, well-oriented, appropriate affect Breasts: The right breast is unremarkable.  The left breast is status post recent biopsy.  There is a moderate ecchymosis.  There is no palpable mass.  Both axillae are benign.   LAB RESULTS:  CMP     Component Value Date/Time   NA 139 03/16/2017 1330   K 4.4 03/16/2017 1330   CL 102 03/16/2017 1330   CO2 25 03/16/2017 1330   GLUCOSE 88 03/16/2017 1330   BUN 23 (H) 03/16/2017 1330   CREATININE 1.24 (H) 03/16/2017 1330   CALCIUM 9.1 03/16/2017 1330   GFRNONAA 41 (L) 03/16/2017 1330   GFRAA 47 (L) 03/16/2017 1330    No results found for: TOTALPROTELP, ALBUMINELP, A1GS, A2GS, BETS, BETA2SER, GAMS, MSPIKE, SPEI  No results found for: KPAFRELGTCHN, LAMBDASER, KAPLAMBRATIO  No results found for: WBC, NEUTROABS, HGB, HCT, MCV, PLT  '@LASTCHEMISTRY'$ @  No results found for: LABCA2  No components found for: [id number]  No results for input(s): INR in the last 168 hours.  No results found for:  LABCA2  No results found for: QIO962  No results found for: XBM841  No results found for: LKG401  No results found for: CA2729  No components found for: HGQUANT  No results found for: CEA1 / No results found for: CEA1   No results found for: AFPTUMOR  No results found for: CHROMOGRNA  No results found for: PSA1  Preadmission on 03/21/2017  Component Date Value Ref Range Status  . Sodium 03/16/2017 139  135 - 145 mmol/L Final  . Potassium 03/16/2017 4.4  3.5 - 5.1 mmol/L Final  . Chloride 03/16/2017 102  101 - 111 mmol/L Final  . CO2 03/16/2017 25  22 - 32 mmol/L Final  . Glucose, Bld 03/16/2017 88  65 - 99 mg/dL Final  . BUN 03/16/2017 23* 6 - 20 mg/dL Final  . Creatinine, Ser 03/16/2017 1.24* 0.44 - 1.00 mg/dL Final  . Calcium 03/16/2017 9.1  8.9 - 10.3 mg/dL Final  . GFR calc non Af Amer 03/16/2017 41* >60 mL/min Final  . GFR calc Af Amer 03/16/2017 47* >60 mL/min Final   Comment: (NOTE) The eGFR has been calculated using the CKD EPI equation. This calculation has not been validated in all clinical situations. eGFR's persistently <60 mL/min signify possible Chronic Kidney Disease.   . Anion gap 03/16/2017 12  5 - 15 Final    (this displays the last labs from the last 3 days)  No results found for: TOTALPROTELP, ALBUMINELP, A1GS, A2GS, BETS, BETA2SER, GAMS, MSPIKE, SPEI (this displays SPEP labs)  No results found for: KPAFRELGTCHN, LAMBDASER, KAPLAMBRATIO (kappa/lambda light chains)  No results found for: HGBA, HGBA2QUANT, HGBFQUANT, HGBSQUAN (Hemoglobinopathy evaluation)   No results found for: LDH  No results found for: IRON, TIBC, IRONPCTSAT (Iron and TIBC)  No results found for: FERRITIN  Urinalysis No results found for: COLORURINE, APPEARANCEUR, LABSPEC, PHURINE, GLUCOSEU, HGBUR, BILIRUBINUR, KETONESUR, PROTEINUR, UROBILINOGEN, NITRITE, LEUKOCYTESUR   STUDIES: Dg Bone Density  Result Date: 02/28/2017 EXAM: DUAL X-RAY ABSORPTIOMETRY (DXA) FOR BONE  MINERAL DENSITY IMPRESSION: Referring Physician:  Suzanna Obey PATIENT: Name: ALTIE, SAVARD Patient ID: [id number] Birth Date: 21-Jan-1941 Height: 63.5 in. Sex: Female Measured: 02/27/2017 Weight: 136.6 lbs. Indications: Advanced Age, Caucasian, Estrogen Deficient, Hysterectomy, Postmenopausal Fractures: None Treatments: Calcium (E943.0), Vitamin D (E933.5) ASSESSMENT: The BMD measured at Forearm Radius 33% is 0.732 g/cm2 with a T-score of -  1.8. This patient is considered osteopenic according to [facility]) criteria. Lumbar spine was not utilized due to being excluded in prior exam. There has been no statistically significant change in BMD of Left hip since prior exam dated 02/07/2014. Site Region Measured Date Measured Age YA BMD Significant CHANGE T-score Left Forearm Radius 33% 02/27/2017 77.9 -1.7 0.732 g/cm2 DualFemur Neck Right 02/27/2017 77.9 -[age] 0.892 g/cm2 World Health Organization [facility]) criteria for post-menopausal, Caucasian Women: Normal       T-score at or above -1 SD Osteopenia   T-score between -1 and -2.5 SD Osteoporosis T-score at or below -2.5 SD RECOMMENDATION: [facility] recommends that FDA-approved medical therapies be considered in postmenopausal women and men age 45 or older with a: 1. Hip or vertebral (clinical or morphometric) fracture. 2. T-score of <-2.5 at the spine or hip. 3. Ten-year fracture probability by FRAX of 3% or greater for hip fracture or 20% or greater for major osteoporotic fracture. All treatment decisions require clinical judgment and consideration of individual patient factors, including patient preferences, co-morbidities, previous drug use, risk factors not captured in the FRAX model (e.g. falls, vitamin D deficiency, increased bone turnover, interval significant decline in bone density) and possible under - or over-estimation of fracture risk by FRAX. All patients should ensure an adequate intake of dietary calcium (1200 mg/d)  and vitamin D (800 IU daily) unless contraindicated. FOLLOW-UP: People with diagnosed cases of osteoporosis or at high risk for fracture should have regular bone mineral density tests. For patients eligible for Medicare, routine testing is allowed once every 2 years. The testing frequency can be increased to one year for patients who have rapidly progressing disease, those who are receiving or discontinuing medical therapy to restore bone mass, or have additional risk factors. FRAX* 10-year Probability of Fracture Based on femoral neck BMD: DualFemur (Right) Major Osteoporotic Fracture: 10.8% Hip Fracture:                2.0% Population:                  [place] (Caucasian) Risk Factors:                None *FRAX is a Materials engineer of the [employer] of [employer] for Metabolic Bone Disease, a World Pharmacologist (WHO) [facility]. ASSESSMENT: The probability of a major osteoporotic fracture is 10.8 % within the next ten years. The probability of hip fracture is  2.0  % within the next 10 years. Electronically Signed   By: Earle Gell M.D.   On: 02/28/2017 11:22   US Breast Ltd Uni Left Inc Axilla  Result Date: 03/03/2017 CLINICAL DATA:  Screening recall for possible mass in the left breast. EXAM: 2D DIGITAL DIAGNOSTIC LEFT MAMMOGRAM WITH CAD AND ADJUNCT TOMO ULTRASOUND LEFT BREAST COMPARISON:  Previous exam(s). ACR Breast Density Category c: The breast tissue is heterogeneously dense, which may obscure small masses. FINDINGS: In the posterior aspect of the left breast, best seen on the MLO view, there is a focal irregular opacity that persists on the spot-compression 2D images. It is more discretely seen on the spot-compression MLO 3D images where its posterior margin appears spiculated. It measures 5-6 mm in size. It projects laterally. Mammographic images were processed with CAD. On physical exam, no discrete mass is palpated in the lateral left breast. Targeted ultrasound is  performed, showing a hypoechoic oval mass with partly ill-defined margins in the left breast at the 3:30  o'clock position, 5 cm the nipple, measuring 8 x 5 x 7 mm, consistent in size, shape and location to the mammographic finding. There is an adjacent simple appearing cyst. Sonographic evaluation of the left axilla shows no enlarged or abnormal lymph nodes. IMPRESSION: 1. Small left breast mass suspicious for breast malignancy. Biopsy is indicated. RECOMMENDATION: Ultrasound-guided core needle biopsy of the small lateral left breast mass. I have discussed the findings and recommendations with the patient. Results were also provided in writing at the conclusion of the visit. If applicable, a reminder letter will be sent to the patient regarding the next appointment. BI-RADS CATEGORY  4: Suspicious. Electronically Signed   By: [name] M.D.   On: 03/03/2017 14:23   Mm Diag Breast Tomo Uni Left  Result Date: 03/03/2017 CLINICAL DATA:  Screening recall for possible mass in the left breast. EXAM: 2D DIGITAL DIAGNOSTIC LEFT MAMMOGRAM WITH CAD AND ADJUNCT TOMO ULTRASOUND LEFT BREAST COMPARISON:  Previous exam(s). ACR Breast Density Category c: The breast tissue is heterogeneously dense, which may obscure small masses. FINDINGS: In the posterior aspect of the left breast, best seen on the MLO view, there is a focal irregular opacity that persists on the spot-compression 2D images. It is more discretely seen on the spot-compression MLO 3D images where its posterior margin appears spiculated. It measures 5-6 mm in size. It projects laterally. Mammographic images were processed with CAD. On physical exam, no discrete mass is palpated in the lateral left breast. Targeted ultrasound is performed, showing a hypoechoic oval mass with partly ill-defined margins in the left breast at the 3:30 o'clock position, 5 cm the nipple, measuring 8 x 5 x 7 mm, consistent in size, shape and location to the mammographic finding. There  is an adjacent simple appearing cyst. Sonographic evaluation of the left axilla shows no enlarged or abnormal lymph nodes. IMPRESSION: 1. Small left breast mass suspicious for breast malignancy. Biopsy is indicated. RECOMMENDATION: Ultrasound-guided core needle biopsy of the small lateral left breast mass. I have discussed the findings and recommendations with the patient. Results were also provided in writing at the conclusion of the visit. If applicable, a reminder letter will be sent to the patient regarding the next appointment. BI-RADS CATEGORY  4: Suspicious. Electronically Signed   By: [name] M.D.   On: 03/03/2017 14:23   Mm Screening Breast Tomo Bilateral  Result Date: 02/27/2017 CLINICAL DATA:  Screening. EXAM: 2D DIGITAL SCREENING BILATERAL MAMMOGRAM WITH 3D TOMO WITH CAD COMPARISON:  Previous exam(s). ACR Breast Density Category c: The breast tissue is heterogeneously dense, which may obscure small masses. FINDINGS: In the left breast, a possible asymmetry warrants further evaluation. In the right breast, no findings suspicious for malignancy. Images were processed with CAD. IMPRESSION: Further evaluation is suggested for possible asymmetry in the left breast. RECOMMENDATION: Diagnostic mammogram and possibly ultrasound of the left breast. (Code:[id number]) The patient will be contacted regarding the findings, and additional imaging will be scheduled. BI-RADS CATEGORY  0: Incomplete. Need additional imaging evaluation and/or prior mammograms for comparison. Electronically Signed   By: Claudie Revering M.D.   On: 02/27/2017 16:21   Mm Clip Placement Left  Result Date: 03/08/2017 CLINICAL DATA:  Evaluate biopsy marker EXAM: DIAGNOSTIC LEFT MAMMOGRAM POST ULTRASOUND BIOPSY COMPARISON:  Previous exam(s). FINDINGS: Mammographic images were obtained following ultrasound guided biopsy of a left breast mass. The ribbon shaped clip is 1 cm anterior to the mammographic finding based on the MLO view.  IMPRESSION: The ribbon shaped clip  is 1 cm anterior to the biopsied mass. I still suspect the biopsied mass correlates with the mammographic finding and the clip may have migrated or been misplaced due to poor visualization of the mass at the end of the third biopsy. Final Assessment: Post Procedure Mammograms for Marker Placement Electronically Signed   By: Heidi Lutz M.D   On: 03/08/2017 16:47   [place] Lt Breast Bx W Loc Dev 1st Lesion Img Bx Spec US Guide  Addendum Date: 03/09/2017   ADDENDUM REPORT: 03/09/2017 12:40 ADDENDUM: Pathology revealed GRADE I INVASIVE MAMMARY CARCINOMA of the Left breast, lower outer. This was found to be concordant by Dr. Dorise Bullion. Pathology results were discussed with the patient and her husband by telephone. The patient reported doing well after the biopsy with tenderness at the site. Post biopsy instructions and care were reviewed and questions were answered. The patient was encouraged to call [facility] for any additional concerns. Surgical consultation has been arranged with Dr. Erroll Luna at [facility] on March 13, 2017. Pathology results reported by Heidi Purser, RN on 03/09/2017. Electronically Signed   By: Heidi Lutz M.D   On: 03/09/2017 12:40   Result Date: 03/09/2017 CLINICAL DATA:  Biopsy left breast mass EXAM: ULTRASOUND GUIDED LEFT BREAST CORE NEEDLE BIOPSY COMPARISON:  Previous exam(s). FINDINGS: I met with the patient and we discussed the procedure of ultrasound-guided biopsy, including benefits and alternatives. We discussed the high likelihood of a successful procedure. We discussed the risks of the procedure, including infection, bleeding, tissue injury, clip migration, and inadequate sampling. Informed written consent was given. The usual time-out protocol was performed immediately prior to the procedure. Lesion quadrant: Lower-outer Using sterile technique and 1% Lidocaine as local  anesthetic, under direct ultrasound visualization, a 12 gauge spring-loaded device was used to perform biopsy of the left breast mass at 3:30 in the lower outer quadrant using a medial approach. At the conclusion of the procedure a tissue marker clip was deployed into the biopsy cavity. Follow up 2 view mammogram was performed and dictated separately. IMPRESSION: Ultrasound guided biopsy of the left breast mass. No apparent complications. Electronically Signed: By: Heidi Lutz M.D On: 03/08/2017 16:15    EKG preop shows a left bundle branch block, which is new as compared to September 2014  ELIGIBLE FOR AVAILABLE RESEARCH PROTOCOL: no  ASSESSMENT: 77 y.o. [place], [place] woman s/p central left breast biopsy 03/08/2017 for a clinical T1b N0, stage IA invasive ductal carcinoma, grade 1, estrogen and progesterone receptor positive, HER-2 not amplified, with an MIB-1 of 2%  (1) breast conserving surgery with sentinel lymph node sampling planned  (2) Oncotype DX to be obtained from the definitive surgical sample  (3) consider adjuvant radiation  (4) tamoxifen to start at the end of local treatment  (a) bone density at the breast center 02/28/2017 showed a T score of -1.8  (b) status post hysterectomy  (c) status post bilateral cataract surgery  (d) history of 5 years of hormone replacement with no clotting complications  PLAN: We spent the better part of today's hour-long appointment discussing the biology of her diagnosis and the specifics of her situation. We first reviewed the fact that cancer is not one disease but more than 100 different diseases and that it is important to keep them separate-- otherwise when friends and relatives discuss their own cancer experiences with Heidi Lutz confusion can result. Similarly we explained that if breast cancer spreads  to the bone or liver, the patient would not have bone cancer or liver cancer, but breast cancer in the bone and breast cancer in the  liver: one cancer in three places-- not 3 different cancers which otherwise would have to be treated in 3 different ways.  We discussed the difference between local and systemic therapy. In terms of loco-regional treatment, lumpectomy plus radiation is equivalent to mastectomy as far as survival is concerned. For this reason, and because the cosmetic results are generally superior, we recommend breast conserving surgery. We also noted that in terms of sequencing of treatments, whether systemic therapy or surgery is done first does not affect the ultimate outcome.  We then discussed the rationale for systemic therapy. There is some risk that this cancer may have already spread to other parts of her body. Patients frequently ask at this point about bone scans, CAT scans and PET scans to find out if they have occult breast cancer somewhere else. The problem is that in early stage disease we are much more likely to find false positives then true cancers and this would expose the patient to unnecessary procedures as well as unnecessary radiation. Scans cannot answer the question the patient really would like to know, which is whether she has microscopic disease elsewhere in her body. For those reasons we do not recommend them.  Of course we would proceed to aggressive evaluation of any symptoms that might suggest metastatic disease, but that is not the case here.  Next we went over the options for systemic therapy which are anti-estrogens, anti-HER-2 immunotherapy, and chemotherapy. Haevyn does not meet criteria for anti-HER-2 immunotherapy. She is a good candidate for anti-estrogens.  The question of chemotherapy is more complicated. Chemotherapy is most effective in rapidly growing, aggressive tumors. It is much less effective in low-grade, slow growing cancers, like Heidi Lutz 's. For that reason we are going to request an Oncotype from the definitive surgical sample, as suggested by NCCN guidelines. That will  help [place] make a definitive decision regarding chemotherapy in this case.  The overall plan then is for surgery, including a sentinel lymph node, then Oncotype which is likely to be low risk and therefore indicating no need for chemotherapy, followed by consideration of radiation depending on definitive surgical results, followed by antiestrogens for 5 years.  The fly in the ointment is her left bundle branch block which appears new.  I do not know whether Dr. is planning a cardiology evaluation prior to the surgery, but if so the planned date 03/21/2017 will have to be postponed.  If the surgery is likely to be postponed a considerable time, she might want to start an antiestrogen now.  We specifically discussed tamoxifen.  She has already had a hysterectomy and bilateral cataract surgery so those are not concerns.  Furthermore she took hormone replacement for 5 years with no clotting complications.  On the other hand she already has significant osteopenia which makes aromatase inhibitors less desirable.  We did discuss the possible toxicity side effects and complications of tamoxifen and I wrote the prescription for her so she may start it at her discretion but I suggest that she not take it unless her surgery is going to be postponed more than 3 weeks.  Tentatively I have made her a return appointment with me in about 5 weeks.  By then not only should we had the surgical results but also the Oncotype results.  Genee has a good understanding of the overall plan. She  agrees with it. She knows the goal of treatment in her case is cure. She will call with any problems that may develop before her next visit here.   Magrinat, [name], MD  03/17/17 4:23 PM Medical Oncology and Hematology [facility] [address] [place],  [address] Tel. [phone]    Fax. [phone]  This document serves as a record of services personally performed by Lurline Del, MD. It was created  on his behalf by Sheron Nightingale, a trained medical scribe. The creation of this record is based on the scribe's personal observations and the provider's statements to them.   I have reviewed the above documentation for accuracy and completeness, and I agree with the above.

## 2017-03-16 NOTE — Progress Notes (Signed)
[facility] Psychosocial Distress Screening Clinical Social Work  Clinical Social Work was referred by distress screening protocol.  The patient scored a 10 on the Psychosocial Distress Thermometer which indicates severe distress. Clinical Social Worker Edwyna Shell to assess for distress and other psychosocial needs. Unable to reach patient by phone, left message w information about [facility] resources.  Encouraged patient to call back for more information and for support.   ON[facility] DISTRESS SCREENING 03/16/2017  Screening Type Initial Screening  Distress experienced in past week (1-10) 10  Family Problem type Other (comment)  Emotional problem type Nervousness/Anxiety;Adjusting to illness    Clinical Social Worker follow up needed: Yes.    If yes, follow up plan:  Await return call from patient.  Edwyna Shell, LCSW Clinical Social Worker Phone:  [phone]

## 2017-03-16 NOTE — Addendum Note (Signed)
Encounter addended by: Kasean Denherder, Stephani Police, RN on: 03/16/2017 1:56 PM  Actions taken: Charge Capture section accepted

## 2017-03-16 NOTE — Progress Notes (Signed)
Radiation Oncology         [phone] ________________________________  Name: Heidi Lutz        MRN: [id number]  Date of Service: 03/16/2017 DOB: 04-26-1940  VP:XTGGYIR, Jannifer Rodney, MD  Erroll Luna, MD     REFERRING PHYSICIAN: Erroll Luna, MD   DIAGNOSIS: The encounter diagnosis was Malignant neoplasm of central portion of left breast in female, estrogen receptor positive ([place]).   HISTORY OF PRESENT ILLNESS: Heidi Lutz is a 77 y.o. female seen at the request of Dr. Brantley Stage for a new diagnosis of left breast cancer. The patient was noted to have a possible asymmetry in the left breast on screening mammogram at the beginning of the month. Diagnostic imaging on 03/03/17 revealed a oval mass in the left breast at 3:30 measuring 8 x 5 x 7 mm with an adjacent simple appearing cyst. The axilla was negative for adenopathy. She had a biopsy on 03/08/17 revealed a grade 1 invasive ductal carcinoma, ER/PR positive, HER2 negative with a Ki 67 of 2%. She has met with Dr. Brantley Stage to discuss options of surgery and is planning to undergo left lumpectomy and sentinel node biopsy on 03/21/17. She has not met with medical oncology.   PREVIOUS RADIATION THERAPY: No   PAST MEDICAL HISTORY:  Past Medical History:  Diagnosis Date  . Cancer ([place]) 01/2017   left breast cancer  . Complication of anesthesia   . Environmental allergies   . High cholesterol   . Osteoarthritis   . PONV (postoperative nausea and vomiting)   . Raynaud disease    Raynauds disease-takes procardia  . Rheumatoid arthritis ([place])        PAST SURGICAL HISTORY: Past Surgical History:  Procedure Laterality Date  . ABDOMINAL HYSTERECTOMY    . APPENDECTOMY    . BUNIONECTOMY    . EYE SURGERY     bil cataract  . GANGLION CYST EXCISION    . JOINT REPLACEMENT Left 2017    reverse shoulder replacement  . REPLACEMENT TOTAL KNEE     Right and Left  . SHOULDER ARTHROSCOPY Left   . SYMPATHECTOMY    . TONSILLECTOMY        FAMILY HISTORY:  Family History  Adopted: Yes  Problem Relation Age of Onset  . Breast cancer Neg Hx      SOCIAL HISTORY:  reports that she quit smoking about 54 years ago. she has never used smokeless tobacco. She reports that she drinks about 8.4 oz of alcohol per week. She reports that she does not use drugs. The patient is married and lives in [place]. She is accompanied by her husband and daughter.    ALLERGIES: Fluorouracil; Codeine; Hydrocodone; and Penicillins   MEDICATIONS:  Current Outpatient Medications  Medication Sig Dispense Refill  . acetaminophen (TYLENOL) 500 MG tablet Take by mouth.    Marland Kitchen aspirin EC 81 MG tablet Take 81 mg by mouth daily.    Marland Kitchen atorvastatin (LIPITOR) 20 MG tablet Take 20 mg by mouth daily.    . Biotin (SUPER BIOTIN) 5 MG TABS Take by mouth.    . Black Cohosh 540 MG CAPS Take by mouth.    . calcium-vitamin D (OSCAL WITH D) 500-200 MG-UNIT TABS tablet Take by mouth.    . cetirizine (ZYRTEC) 10 MG tablet Take 10 mg by mouth daily.    . Cholecalciferol (VITAMIN D) 2000 units CAPS Take by mouth.    . co-enzyme Q-10 50 MG capsule Take by mouth.    Marland Kitchen  Cranberry 500 MG CAPS Take 4,200 mg by mouth.    . dicyclomine (BENTYL) 10 MG capsule TK 1 C PO BID  1  . Glucosamine-Chondroit-Vit C-Mn (GLUCOSAMINE-CHONDROITIN MAX ST) CAPS Take by mouth.    . Magnesium 200 MG TABS Take by mouth.    . Multiple Vitamins-Minerals (CENTRUM ADULTS PO) Take by mouth.    Marland Kitchen NIFEdipine (PROCARDIA XL/ADALAT-CC) 90 MG 24 hr tablet Take 90 mg by mouth daily.     . Probiotic Product (PROBIOTIC ADVANCED PO) Take by mouth.    . traMADol (ULTRAM) 50 MG tablet     . zolpidem (AMBIEN) 5 MG tablet   0   No current facility-administered medications for this encounter.      REVIEW OF SYSTEMS: On review of systems, the patient reports that she is doing well overall. She denies any chest pain, shortness of breath, cough, fevers, chills, night sweats, unintended weight changes. She  denies any bowel or bladder disturbances, and denies abdominal pain, nausea or vomiting. She denies any new musculoskeletal or joint aches or pains. A complete review of systems is obtained and is otherwise negative.     PHYSICAL EXAM:  Wt Readings from Last 3 Encounters:  03/16/17 135 lb 12.8 oz (61.6 kg)  10/30/12 128 lb (58.1 kg)   Temp Readings from Last 3 Encounters:  03/16/17 97.8 F (36.6 C)  10/30/12 97.5 F (36.4 C) (Oral)   BP Readings from Last 3 Encounters:  03/16/17 138/82  10/30/12 114/56   Pulse Readings from Last 3 Encounters:  03/16/17 77  10/30/12 94     In general this is a well appearing caucasian  female in no acute distress. She is alert and oriented x4 and appropriate throughout the examination. HEENT reveals that the patient is normocephalic, atraumatic. EOMs are intact. PERRLA. Skin is intact without any evidence of gross lesions. Cardiopulmonary assessment is negative for acute distress and she exhibits normal effort. Breast exam is deferred.    ECOG = 0  0 - Asymptomatic (Fully active, able to carry on all predisease activities without restriction)  1 - Symptomatic but completely ambulatory (Restricted in physically strenuous activity but ambulatory and able to carry out work of a light or sedentary nature. For example, light housework, office work)  2 - Symptomatic, <50% in bed during the day (Ambulatory and capable of all self care but unable to carry out any work activities. Up and about more than 50% of waking hours)  3 - Symptomatic, >50% in bed, but not bedbound (Capable of only limited self-care, confined to bed or chair 50% or more of waking hours)  4 - Bedbound (Completely disabled. Cannot carry on any self-care. Totally confined to bed or chair)  5 - Death   Eustace Pen MM, Creech RH, Tormey DC, et al. [phone]). "Toxicity and response criteria of the [facility]". [place] Oncol. 5 (6): 649-55    LABORATORY DATA:   No results found for: WBC, HGB, HCT, MCV, PLT No results found for: NA, K, CL, CO2 No results found for: ALT, AST, GGT, ALKPHOS, BILITOT    RADIOGRAPHY: Dg Bone Density  Result Date: 02/28/2017 EXAM: DUAL X-RAY ABSORPTIOMETRY (DXA) FOR BONE MINERAL DENSITY IMPRESSION: Referring Physician:  Suzanna Obey PATIENT: Name: KYRENE, LONGAN Patient ID: [id number] Birth Date: [birth year] Height: 63.5 in. Sex: Female Measured: 02/27/2017 Weight: 136.6 lbs. Indications: Advanced Age, Caucasian, Estrogen Deficient, Hysterectomy, Postmenopausal Fractures: None Treatments: Calcium (E943.0), Vitamin D (E933.5) ASSESSMENT: The BMD measured at Forearm  Radius 33% is 0.732 g/cm2 with a T-score of -1.8. This patient is considered osteopenic according to [facility]) criteria. Lumbar spine was not utilized due to being excluded in prior exam. There has been no statistically significant change in BMD of Left hip since prior exam dated 02/07/2014. Site Region Measured Date Measured Age YA BMD Significant CHANGE T-score Left Forearm Radius 33% 02/27/2017 77.9 -1.8 0.732 g/cm2 DualFemur Neck Right 02/27/2017 77.9 -[age] 0.892 g/cm2 World Health Organization [facility]) criteria for post-menopausal, Caucasian Women: Normal       T-score at or above -1 SD Osteopenia   T-score between -1 and -2.5 SD Osteoporosis T-score at or below -2.5 SD RECOMMENDATION: [facility] recommends that FDA-approved medical therapies be considered in postmenopausal women and men age 59 or older with a: 1. Hip or vertebral (clinical or morphometric) fracture. 2. T-score of <-2.5 at the spine or hip. 3. Ten-year fracture probability by FRAX of 3% or greater for hip fracture or 20% or greater for major osteoporotic fracture. All treatment decisions require clinical judgment and consideration of individual patient factors, including patient preferences, co-morbidities, previous drug use, risk factors not captured in the FRAX model  (e.g. falls, vitamin D deficiency, increased bone turnover, interval significant decline in bone density) and possible under - or over-estimation of fracture risk by FRAX. All patients should ensure an adequate intake of dietary calcium (1200 mg/d) and vitamin D (800 IU daily) unless contraindicated. FOLLOW-UP: People with diagnosed cases of osteoporosis or at high risk for fracture should have regular bone mineral density tests. For patients eligible for Medicare, routine testing is allowed once every 2 years. The testing frequency can be increased to one year for patients who have rapidly progressing disease, those who are receiving or discontinuing medical therapy to restore bone mass, or have additional risk factors. FRAX* 10-year Probability of Fracture Based on femoral neck BMD: DualFemur (Right) Major Osteoporotic Fracture: 10.8% Hip Fracture:                2.0% Population:                  [place] (Caucasian) Risk Factors:                None *FRAX is a Materials engineer of the [employer] for Metabolic Bone Disease, a World Pharmacologist (WHO) [facility]. ASSESSMENT: The probability of a major osteoporotic fracture is 10.8 % within the next ten years. The probability of hip fracture is  2.0  % within the next 10 years. Electronically Signed   By: Earle Gell M.D.   On: 02/28/2017 11:22   US Breast Ltd Uni Left Inc Axilla  Result Date: 03/03/2017 CLINICAL DATA:  Screening recall for possible mass in the left breast. EXAM: 2D DIGITAL DIAGNOSTIC LEFT MAMMOGRAM WITH CAD AND ADJUNCT TOMO ULTRASOUND LEFT BREAST COMPARISON:  Previous exam(s). ACR Breast Density Category c: The breast tissue is heterogeneously dense, which may obscure small masses. FINDINGS: In the posterior aspect of the left breast, best seen on the MLO view, there is a focal irregular opacity that persists on the spot-compression 2D images. It is more discretely seen on the spot-compression MLO 3D  images where its posterior margin appears spiculated. It measures 5-6 mm in size. It projects laterally. Mammographic images were processed with CAD. On physical exam, no discrete mass is palpated in the lateral left breast. Targeted ultrasound is performed, showing a hypoechoic oval mass with partly  ill-defined margins in the left breast at the 3:30 o'clock position, 5 cm the nipple, measuring 8 x 5 x 7 mm, consistent in size, shape and location to the mammographic finding. There is an adjacent simple appearing cyst. Sonographic evaluation of the left axilla shows no enlarged or abnormal lymph nodes. IMPRESSION: 1. Small left breast mass suspicious for breast malignancy. Biopsy is indicated. RECOMMENDATION: Ultrasound-guided core needle biopsy of the small lateral left breast mass. I have discussed the findings and recommendations with the patient. Results were also provided in writing at the conclusion of the visit. If applicable, a reminder letter will be sent to the patient regarding the next appointment. BI-RADS CATEGORY  4: Suspicious. Electronically Signed   By: [name] M.D.   On: 03/03/2017 14:23   Mm Diag Breast Tomo Uni Left  Result Date: 03/03/2017 CLINICAL DATA:  Screening recall for possible mass in the left breast. EXAM: 2D DIGITAL DIAGNOSTIC LEFT MAMMOGRAM WITH CAD AND ADJUNCT TOMO ULTRASOUND LEFT BREAST COMPARISON:  Previous exam(s). ACR Breast Density Category c: The breast tissue is heterogeneously dense, which may obscure small masses. FINDINGS: In the posterior aspect of the left breast, best seen on the MLO view, there is a focal irregular opacity that persists on the spot-compression 2D images. It is more discretely seen on the spot-compression MLO 3D images where its posterior margin appears spiculated. It measures 5-6 mm in size. It projects laterally. Mammographic images were processed with CAD. On physical exam, no discrete mass is palpated in the lateral left breast. Targeted  ultrasound is performed, showing a hypoechoic oval mass with partly ill-defined margins in the left breast at the 3:30 o'clock position, 5 cm the nipple, measuring 8 x 5 x 7 mm, consistent in size, shape and location to the mammographic finding. There is an adjacent simple appearing cyst. Sonographic evaluation of the left axilla shows no enlarged or abnormal lymph nodes. IMPRESSION: 1. Small left breast mass suspicious for breast malignancy. Biopsy is indicated. RECOMMENDATION: Ultrasound-guided core needle biopsy of the small lateral left breast mass. I have discussed the findings and recommendations with the patient. Results were also provided in writing at the conclusion of the visit. If applicable, a reminder letter will be sent to the patient regarding the next appointment. BI-RADS CATEGORY  4: Suspicious. Electronically Signed   By: [name] M.D.   On: 03/03/2017 14:23   Mm Screening Breast Tomo Bilateral  Result Date: 02/27/2017 CLINICAL DATA:  Screening. EXAM: 2D DIGITAL SCREENING BILATERAL MAMMOGRAM WITH 3D TOMO WITH CAD COMPARISON:  Previous exam(s). ACR Breast Density Category c: The breast tissue is heterogeneously dense, which may obscure small masses. FINDINGS: In the left breast, a possible asymmetry warrants further evaluation. In the right breast, no findings suspicious for malignancy. Images were processed with CAD. IMPRESSION: Further evaluation is suggested for possible asymmetry in the left breast. RECOMMENDATION: Diagnostic mammogram and possibly ultrasound of the left breast. (Code:[id number]) The patient will be contacted regarding the findings, and additional imaging will be scheduled. BI-RADS CATEGORY  0: Incomplete. Need additional imaging evaluation and/or prior mammograms for comparison. Electronically Signed   By: Claudie Revering M.D.   On: 02/27/2017 16:21   Mm Clip Placement Left  Result Date: 03/08/2017 CLINICAL DATA:  Evaluate biopsy marker EXAM: DIAGNOSTIC LEFT MAMMOGRAM  POST ULTRASOUND BIOPSY COMPARISON:  Previous exam(s). FINDINGS: Mammographic images were obtained following ultrasound guided biopsy of a left breast mass. The ribbon shaped clip is 1 cm anterior to the mammographic finding based  on the MLO view. IMPRESSION: The ribbon shaped clip is 1 cm anterior to the biopsied mass. I still suspect the biopsied mass correlates with the mammographic finding and the clip may have migrated or been misplaced due to poor visualization of the mass at the end of the third biopsy. Final Assessment: Post Procedure Mammograms for Marker Placement Electronically Signed   By: Dorise Bullion III M.D   On: 03/08/2017 16:47   [place] Lt Breast Bx W Loc Dev 1st Lesion Img Bx Spec US Guide  Addendum Date: 03/09/2017   ADDENDUM REPORT: 03/09/2017 12:40 ADDENDUM: Pathology revealed GRADE I INVASIVE MAMMARY CARCINOMA of the Left breast, lower outer. This was found to be concordant by Dr. Dorise Bullion. Pathology results were discussed with the patient and her husband by telephone. The patient reported doing well after the biopsy with tenderness at the site. Post biopsy instructions and care were reviewed and questions were answered. The patient was encouraged to call [facility] for any additional concerns. Surgical consultation has been arranged with Dr. Erroll Luna at [facility] on March 13, 2017. Pathology results reported by Terie Purser, RN on 03/09/2017. Electronically Signed   By: Dorise Bullion III M.D   On: 03/09/2017 12:40   Result Date: 03/09/2017 CLINICAL DATA:  Biopsy left breast mass EXAM: ULTRASOUND GUIDED LEFT BREAST CORE NEEDLE BIOPSY COMPARISON:  Previous exam(s). FINDINGS: I met with the patient and we discussed the procedure of ultrasound-guided biopsy, including benefits and alternatives. We discussed the high likelihood of a successful procedure. We discussed the risks of the procedure, including infection, bleeding, tissue  injury, clip migration, and inadequate sampling. Informed written consent was given. The usual time-out protocol was performed immediately prior to the procedure. Lesion quadrant: Lower-outer Using sterile technique and 1% Lidocaine as local anesthetic, under direct ultrasound visualization, a 12 gauge spring-loaded device was used to perform biopsy of the left breast mass at 3:30 in the lower outer quadrant using a medial approach. At the conclusion of the procedure a tissue marker clip was deployed into the biopsy cavity. Follow up 2 view mammogram was performed and dictated separately. IMPRESSION: Ultrasound guided biopsy of the left breast mass. No apparent complications. Electronically Signed: By: Dorise Bullion III M.D On: 03/08/2017 16:15       IMPRESSION/PLAN: 1. Stage IA, [id number] grade 1, ER/PR positive invasive ductal carcinoma of the left breast. Dr. Lisbeth Renshaw discusses the pathology findings and reviews the nature of invasive breast disease. reviews the recommendation to proceed with conservation surgery with lumpectomy and sentinel node evaluation. Depending on the size of the final tumor measurements rendered by pathology, the tumor may be tested for oncotype dx score to determine a role for systemic therapy. Provided that chemotherapy is not indicated, the patient's course would then be followed by external radiotherapy to the breast followed by antiestrogen therapy. We discussed the risks, benefits, short, and long term effects of radiotherapy, and the patient is interested in proceeding. Dr. Lisbeth Renshaw discusses the delivery and logistics of radiotherapy and anticipates a course of 4 weeks with possible use of deep inspiration breath hold. We will see her back about 2 weeks after surgery to move forward with the simulation and planning process and anticipate starting radiotherapy about 4 weeks after surgery. She will also see Dr. Jana Hakim tomorrow in Medical Oncology.   In a visit lasting 60  minutes, greater than 50% of the time was spent face to face discussing her findings, and coordinating  the patient's care.  The above documentation reflects my direct findings during this shared patient visit. Please see the separate note by Dr. Lisbeth Renshaw on this date for the remainder of the patient's plan of care.    Carola Rhine, PAC

## 2017-03-16 NOTE — Telephone Encounter (Signed)
Appt has been scheduled for the pt to see Dr. Jana Hakim on 1/25 at [facility] gave the pt the appt date and time.

## 2017-03-16 NOTE — Progress Notes (Signed)
Ensure pre surgery drink given with instructions to complete by [facility], hibiclens soap given with instructions, pt verbalized understanding.   EKG reviewed by Dr. Gifford Shave, pt will need to see cardiologist and have echo prior to surgery, notified Colletta [place] at Dr. Josetta Huddle office, pt aware of plan.

## 2017-03-16 NOTE — Addendum Note (Signed)
Encounter addended by: Hayden Pedro, PA-C on: 03/16/2017 11:57 AM  Actions taken: Sign clinical note

## 2017-03-17 ENCOUNTER — Telehealth: Payer: Self-pay | Admitting: Oncology

## 2017-03-17 ENCOUNTER — Encounter: Payer: Self-pay | Admitting: General Practice

## 2017-03-17 ENCOUNTER — Inpatient Hospital Stay: Payer: Medicare Other | Attending: Oncology | Admitting: Oncology

## 2017-03-17 VITALS — BP 136/86 | HR 103 | Temp 97.3°F | Resp 20 | Ht 63.0 in | Wt 137.4 lb

## 2017-03-17 DIAGNOSIS — M069 Rheumatoid arthritis, unspecified: Secondary | ICD-10-CM | POA: Diagnosis not present

## 2017-03-17 DIAGNOSIS — C50112 Malignant neoplasm of central portion of left female breast: Secondary | ICD-10-CM | POA: Insufficient documentation

## 2017-03-17 DIAGNOSIS — I7300 Raynaud's syndrome without gangrene: Secondary | ICD-10-CM | POA: Diagnosis not present

## 2017-03-17 DIAGNOSIS — Z90710 Acquired absence of both cervix and uterus: Secondary | ICD-10-CM | POA: Insufficient documentation

## 2017-03-17 DIAGNOSIS — Z79899 Other long term (current) drug therapy: Secondary | ICD-10-CM | POA: Diagnosis not present

## 2017-03-17 DIAGNOSIS — E7800 Pure hypercholesterolemia, unspecified: Secondary | ICD-10-CM | POA: Insufficient documentation

## 2017-03-17 DIAGNOSIS — Z170 Estrogen receptor positive status [ER+]: Secondary | ICD-10-CM | POA: Insufficient documentation

## 2017-03-17 DIAGNOSIS — Z9842 Cataract extraction status, left eye: Secondary | ICD-10-CM | POA: Insufficient documentation

## 2017-03-17 DIAGNOSIS — Z87891 Personal history of nicotine dependence: Secondary | ICD-10-CM | POA: Diagnosis not present

## 2017-03-17 DIAGNOSIS — Z7982 Long term (current) use of aspirin: Secondary | ICD-10-CM | POA: Insufficient documentation

## 2017-03-17 DIAGNOSIS — I447 Left bundle-branch block, unspecified: Secondary | ICD-10-CM | POA: Insufficient documentation

## 2017-03-17 DIAGNOSIS — Z9841 Cataract extraction status, right eye: Secondary | ICD-10-CM | POA: Diagnosis not present

## 2017-03-17 NOTE — Telephone Encounter (Signed)
Gave patient AVs and calendar of upcoming March appointments. °

## 2017-03-17 NOTE — Progress Notes (Signed)
Heidi Lutz CSW Progress Note  CSW spoke w patient, reviewed results of Distress Screen.  Husband has had recent triple bypass in November, patient has been diagnosed w breast cancer.  Surgery now on hold.  "I have a wonderful prayer support system going, it's just going to take time to get things under control."  No issues w transportation, has support from husband at home.  Requests mailed information packet, CSW will mail today.  Encouraged patient to contact for support as needed.  Edwyna Shell, LCSW Clinical Social Worker Phone:  [phone]

## 2017-03-20 ENCOUNTER — Other Ambulatory Visit: Payer: Self-pay | Admitting: Oncology

## 2017-03-20 ENCOUNTER — Other Ambulatory Visit: Payer: Self-pay | Admitting: *Deleted

## 2017-03-20 ENCOUNTER — Inpatient Hospital Stay: Admission: RE | Admit: 2017-03-20 | Payer: Medicare Other | Source: Ambulatory Visit

## 2017-03-20 MED ORDER — TAMOXIFEN CITRATE 20 MG PO TABS: 20.0000 mg | ORAL_TABLET | Freq: Every day | ORAL | 1 refills | Status: DC

## 2017-03-20 NOTE — Telephone Encounter (Signed)
This RN received call from pt's husband stating surgery is on hold until the patient completes cardiac work up - they would like to proceed with use of tamoxifen presently ( neoadjuvant ).  The patient is scheduled for cardiac visit this Wednesday 03/22/2017.  Prescription for tamoxifen sent to verified pharmacy.

## 2017-03-21 ENCOUNTER — Ambulatory Visit (HOSPITAL_BASED_OUTPATIENT_CLINIC_OR_DEPARTMENT_OTHER): Admission: RE | Admit: 2017-03-21 | Payer: Medicare Other | Source: Ambulatory Visit | Admitting: Surgery

## 2017-03-21 ENCOUNTER — Encounter (HOSPITAL_COMMUNITY): Payer: Medicare Other

## 2017-03-21 HISTORY — DX: Nausea with vomiting, unspecified: R11.2

## 2017-03-21 HISTORY — DX: Other specified postprocedural states: Z98.890

## 2017-03-21 HISTORY — DX: Adverse effect of unspecified anesthetic, initial encounter: T41.45XA

## 2017-03-21 HISTORY — DX: Malignant (primary) neoplasm, unspecified: C80.1

## 2017-03-21 HISTORY — DX: Other complications of anesthesia, initial encounter: T88.59XA

## 2017-03-21 SURGERY — BREAST LUMPECTOMY WITH RADIOACTIVE SEED AND SENTINEL LYMPH NODE BIOPSY
Anesthesia: General | Site: Breast | Laterality: Left

## 2017-03-22 ENCOUNTER — Encounter: Payer: Self-pay | Admitting: Internal Medicine

## 2017-03-22 ENCOUNTER — Telehealth (HOSPITAL_COMMUNITY): Payer: Self-pay

## 2017-03-22 ENCOUNTER — Ambulatory Visit (INDEPENDENT_AMBULATORY_CARE_PROVIDER_SITE_OTHER): Payer: Medicare Other | Admitting: Internal Medicine

## 2017-03-22 VITALS — BP 174/96 | HR 98 | Ht 63.0 in | Wt 130.4 lb

## 2017-03-22 DIAGNOSIS — Z01810 Encounter for preprocedural cardiovascular examination: Secondary | ICD-10-CM | POA: Insufficient documentation

## 2017-03-22 DIAGNOSIS — E782 Mixed hyperlipidemia: Secondary | ICD-10-CM | POA: Insufficient documentation

## 2017-03-22 DIAGNOSIS — I447 Left bundle-branch block, unspecified: Secondary | ICD-10-CM | POA: Diagnosis not present

## 2017-03-22 HISTORY — DX: Left bundle-branch block, unspecified: I44.7

## 2017-03-22 HISTORY — DX: Mixed hyperlipidemia: E78.2

## 2017-03-22 HISTORY — DX: Encounter for preprocedural cardiovascular examination: Z01.810

## 2017-03-22 NOTE — Telephone Encounter (Signed)
Pt given detailed instructions for NUC MPI on Friday. Pt did RBV same.

## 2017-03-22 NOTE — Patient Instructions (Signed)
Your physician has requested that you have a lexiscan myoview. For further information please visit [email or web address]. Please follow instruction sheet, as given.  Your physician wants you to follow-up in: ONE YEAR with Dr. Debara Pickett. You will receive a reminder letter in the mail two months in advance. If you don't receive a letter, please call our office to schedule the follow-up appointment.

## 2017-03-22 NOTE — Progress Notes (Signed)
OFFICE CONSULT NOTE  Chief Complaint:  Preoperative risk evaluation, LBBB  Primary Care Physician: Katherina Mires, MD  HPI:  Heidi Lutz is a 77 y.o. female who is being seen today for the evaluation of preoperative risk evaluation, LBBB at the request of Dr. Erroll Luna.  This is a pleasant 77 year old female who is kindly referred for evaluation of preoperative risk.  Unfortunately she was recently diagnosed with a left breast cancer.  She is a patient of Dr. Jana Hakim.  She has been referred to Dr. Brantley Stage for evaluation of breast surgery.  As part of her preoperative workup she was noted to have a left bundle branch block.  This is a new finding compared to prior EKG less than 2 years ago.  She denies any chest pain or history of prior MI.  She is unaware of her family history due to being adopted.  Other medical problems include dyslipidemia, osteoarthritis and Raynaud's phenomenon for which she takes nifedipine.  Blood pressure was initially elevated 174/96 today however came down to 124/88.  EKG was personally reviewed showed normal sinus rhythm 65, left axis deviation and left bundle branch block.  As mentioned she denies any angina or shortness of breath with exertion.  PMHx:  Past Medical History:  Diagnosis Date  . Cancer ([place]) 01/2017   left breast cancer  . Complication of anesthesia   . Environmental allergies   . High cholesterol   . Osteoarthritis   . PONV (postoperative nausea and vomiting)   . Raynaud disease    Raynauds disease-takes procardia  . Rheumatoid arthritis [facility])     Past Surgical History:  Procedure Laterality Date  . ABDOMINAL HYSTERECTOMY  1972  . APPENDECTOMY  1953  . BUNIONECTOMY  1999  . EYE SURGERY     bil cataract  . GANGLION CYST EXCISION  1968  . JOINT REPLACEMENT Left 2017    reverse shoulder replacement  . REPLACEMENT TOTAL KNEE  2012, 2013   Right and Left  . SHOULDER ARTHROSCOPY Left   . SYMPATHECTOMY  1970  . TONSILLECTOMY   1949    FAMHx:  Family History  Adopted: Yes  Problem Relation Age of Onset  . Breast cancer Neg Hx     SOCHx:   reports that she quit smoking about 54 years ago. she has never used smokeless tobacco. She reports that she drinks about 8.4 oz of alcohol per week. She reports that she does not use drugs.  ALLERGIES:  Allergies  Allergen Reactions  . Fluorouracil Hives  . Codeine Nausea And Vomiting  . Hydrocodone Nausea And Vomiting  . Penicillins Rash and Other (See Comments)    Pt states allergic to all "cillin" drugs. Joint pain.    ROS: Pertinent items noted in HPI and remainder of comprehensive ROS otherwise negative.  HOME MEDS: Current Outpatient Medications on File Prior to Visit  Medication Sig Dispense Refill  . acetaminophen (TYLENOL) 500 MG tablet Take by mouth.    Marland Kitchen aspirin EC 81 MG tablet Take 81 mg by mouth daily.    Marland Kitchen atorvastatin (LIPITOR) 20 MG tablet Take 20 mg by mouth daily.    . Biotin (SUPER BIOTIN) 5 MG TABS Take by mouth.    . Black Cohosh 540 MG CAPS Take by mouth.    . calcium-vitamin D (OSCAL WITH D) 500-200 MG-UNIT TABS tablet Take by mouth.    . cetirizine (ZYRTEC) 10 MG tablet Take 10 mg by mouth daily.    Marland Kitchen  Cholecalciferol (VITAMIN D) 2000 units CAPS Take by mouth.    . co-enzyme Q-10 50 MG capsule Take by mouth.    . Cranberry 500 MG CAPS Take 4,200 mg by mouth.    . dicyclomine (BENTYL) 10 MG capsule TK 1 C PO BID  1  . Glucosamine-Chondroit-Vit C-Mn (GLUCOSAMINE-CHONDROITIN MAX ST) CAPS Take by mouth.    . Magnesium 200 MG TABS Take by mouth.    . Multiple Vitamins-Minerals (CENTRUM ADULTS PO) Take by mouth.    Marland Kitchen NIFEdipine (PROCARDIA XL/ADALAT-CC) 90 MG 24 hr tablet Take 90 mg by mouth daily.     . Probiotic Product (PROBIOTIC ADVANCED PO) Take by mouth.    . tamoxifen (NOLVADEX) 20 MG tablet TAKE 1 TABLET(20 MG) BY MOUTH DAILY 90 tablet 1  . traMADol (ULTRAM) 50 MG tablet     . zolpidem (AMBIEN) 5 MG tablet   0   No current  facility-administered medications on file prior to visit.     LABS/IMAGING: No results found for this or any previous visit (from the past 48 hour(s)). No results found.  LIPID PANEL: No results found for: CHOL, TRIG, HDL, CHOLHDL, VLDL, LDLCALC, LDLDIRECT  WEIGHTS: Wt Readings from Last 3 Encounters:  03/22/17 130 lb 6.4 oz (59.1 kg)  03/17/17 137 lb 6.4 oz (62.3 kg)  03/16/17 135 lb 12.8 oz (61.6 kg)    VITALS: BP (!) 174/96   Pulse 98   Ht 5\' 3"  (1.6 m)   Wt 130 lb 6.4 oz (59.1 kg)   SpO2 98%   BMI 23.10 kg/m   EXAM: General appearance: alert and no distress Neck: no carotid bruit, no JVD and thyroid not enlarged, symmetric, no tenderness/mass/nodules Lungs: clear to auscultation bilaterally Heart: regular rate and rhythm, S1, S2 normal, no murmur, click, rub or gallop Abdomen: soft, non-tender; bowel sounds normal; no masses,  no organomegaly Extremities: extremities normal, atraumatic, no cyanosis or edema Pulses: 2+ and symmetric Skin: Skin color, texture, turgor normal. No rashes or lesions Neurologic: Grossly normal Psych: Pleasant  EKG: Sinus rhythm, left axis deviation left bundle branch block at 65- personally reviewed  ASSESSMENT: 1. Newly identified LBBB 2. Indeterminant preoperative risk 3. Dyslipidemia  PLAN: 1.   Heidi Lutz has few cardiac risk factors and no known cardiac history.  She does not our family history since she is adopted.  She was newly identified to have a left bundle branch block.  She is planning to undergo breast surgery under general anesthesia.  I recommend a Lexiscan Myoview to rule out any ischemic cause of this although she has been asymptomatic.  We will contact her with the results of her study and if negative or low risk then she can proceed with surgery.  I am happy to see her back annually for follow-up. We will be able to schedule her Friday of this week for the stress test.  Thanks for the kind referral.  Pixie Casino, MD, [facility], [place] Director of the Advanced Lipid Disorders &  Cardiovascular Risk Reduction Clinic Diplomate of the American Board of Clinical Lipidology Attending Cardiologist  Direct Dial: [phone]  Fax: [phone]  Website:  [email or web address] 03/22/2017, 9:39 AM

## 2017-03-24 ENCOUNTER — Ambulatory Visit (HOSPITAL_COMMUNITY)
Admission: RE | Admit: 2017-03-24 | Discharge: 2017-03-24 | Disposition: A | Discharge status: Home / Self Care | Payer: Medicare Other | Source: Ambulatory Visit | Attending: Cardiology | Admitting: Cardiology

## 2017-03-24 DIAGNOSIS — Z01810 Encounter for preprocedural cardiovascular examination: Secondary | ICD-10-CM | POA: Insufficient documentation

## 2017-03-24 DIAGNOSIS — I447 Left bundle-branch block, unspecified: Secondary | ICD-10-CM | POA: Insufficient documentation

## 2017-03-24 LAB — MYOCARDIAL PERFUSION IMAGING
CHL CUP NUCLEAR SSS: 8
CHL CUP RESTING HR STRESS: 82 {beats}/min
LV dias vol: 90 mL (ref 46–106)
LV sys vol: 35 mL
NUC STRESS TID: 0.93
Peak HR: 112 {beats}/min
SDS: 1
SRS: 7

## 2017-03-24 MED ORDER — TECHNETIUM TC 99M TETROFOSMIN IV KIT
9.9000 | PACK | Freq: Once | INTRAVENOUS | Status: AC | PRN
  Administered 2017-03-24: 9.9 via INTRAVENOUS
  Filled 2017-03-24: qty 10

## 2017-03-24 MED ORDER — TECHNETIUM TC 99M TETROFOSMIN IV KIT
32.6000 | PACK | Freq: Once | INTRAVENOUS | Status: AC | PRN
  Administered 2017-03-24: 32.6 via INTRAVENOUS
  Filled 2017-03-24: qty 33

## 2017-03-24 MED ORDER — REGADENOSON 0.4 MG/5ML IV SOLN
0.4000 mg | Freq: Once | INTRAVENOUS | Status: AC
  Administered 2017-03-24: 0.4 mg via INTRAVENOUS

## 2017-04-04 ENCOUNTER — Ambulatory Visit
Admission: RE | Admit: 2017-04-04 | Discharge: 2017-04-04 | Disposition: A | Discharge status: Home / Self Care | Payer: Medicare Other | Source: Ambulatory Visit | Attending: Surgery | Admitting: Surgery

## 2017-04-04 DIAGNOSIS — C50912 Malignant neoplasm of unspecified site of left female breast: Secondary | ICD-10-CM

## 2017-04-06 ENCOUNTER — Encounter (HOSPITAL_COMMUNITY): Payer: Self-pay | Admitting: *Deleted

## 2017-04-06 ENCOUNTER — Other Ambulatory Visit: Payer: Self-pay

## 2017-04-06 NOTE — Progress Notes (Signed)
Spoke with pt for pre-op call. Pt was to have surgery at [facility], but her EKG was abnormal with a left bundle branch block. She has seen Dr. Debara Pickett and has had a stress test that showed low risk. Pt states Dr. Debara Pickett gave her the ok to have surgery. She states she's never had any chest pain or sob. Pt states she is not diabetic. Pt was given Ensure Pre-Surgery at [place] and will drink it in the AM. Instructed her to drink as she is leaving for the hospital. Pt also has the CHG soap and will shower with it tonight and in the AM.

## 2017-04-07 ENCOUNTER — Ambulatory Visit (HOSPITAL_COMMUNITY): Payer: Medicare Other | Admitting: Anesthesiology

## 2017-04-07 ENCOUNTER — Encounter (HOSPITAL_COMMUNITY): Payer: Self-pay

## 2017-04-07 ENCOUNTER — Ambulatory Visit (HOSPITAL_COMMUNITY)
Admission: RE | Admit: 2017-04-07 | Discharge: 2017-04-07 | Disposition: A | Discharge status: Home / Self Care | Payer: Medicare Other | Source: Ambulatory Visit | Attending: Surgery | Admitting: Surgery

## 2017-04-07 ENCOUNTER — Encounter (HOSPITAL_COMMUNITY)
Admission: RE | Disposition: A | Discharge status: Home / Self Care | Payer: Self-pay | Source: Ambulatory Visit | Attending: Surgery

## 2017-04-07 ENCOUNTER — Other Ambulatory Visit: Payer: Self-pay

## 2017-04-07 ENCOUNTER — Ambulatory Visit
Admission: RE | Admit: 2017-04-07 | Discharge: 2017-04-07 | Disposition: A | Discharge status: Home / Self Care | Payer: Medicare Other | Source: Ambulatory Visit | Attending: Surgery | Admitting: Surgery

## 2017-04-07 ENCOUNTER — Encounter (HOSPITAL_COMMUNITY)
Admission: RE | Admit: 2017-04-07 | Discharge: 2017-04-07 | Disposition: A | Discharge status: Home / Self Care | Payer: Medicare Other | Source: Ambulatory Visit | Attending: Surgery | Admitting: Surgery

## 2017-04-07 DIAGNOSIS — Z87891 Personal history of nicotine dependence: Secondary | ICD-10-CM | POA: Insufficient documentation

## 2017-04-07 DIAGNOSIS — C50412 Malignant neoplasm of upper-outer quadrant of left female breast: Secondary | ICD-10-CM | POA: Diagnosis present

## 2017-04-07 DIAGNOSIS — I739 Peripheral vascular disease, unspecified: Secondary | ICD-10-CM | POA: Diagnosis not present

## 2017-04-07 DIAGNOSIS — Z90710 Acquired absence of both cervix and uterus: Secondary | ICD-10-CM | POA: Diagnosis not present

## 2017-04-07 DIAGNOSIS — Z85820 Personal history of malignant melanoma of skin: Secondary | ICD-10-CM | POA: Insufficient documentation

## 2017-04-07 DIAGNOSIS — C50912 Malignant neoplasm of unspecified site of left female breast: Secondary | ICD-10-CM

## 2017-04-07 DIAGNOSIS — Z79899 Other long term (current) drug therapy: Secondary | ICD-10-CM | POA: Diagnosis not present

## 2017-04-07 DIAGNOSIS — I7300 Raynaud's syndrome without gangrene: Secondary | ICD-10-CM | POA: Diagnosis not present

## 2017-04-07 HISTORY — PX: BREAST LUMPECTOMY WITH RADIOACTIVE SEED AND SENTINEL LYMPH NODE BIOPSY: SHX6550

## 2017-04-07 LAB — BASIC METABOLIC PANEL
ANION GAP: 12 (ref 5–15)
BUN: 19 mg/dL (ref 6–20)
CO2: 26 mmol/L (ref 22–32)
Calcium: 9.1 mg/dL (ref 8.9–10.3)
Chloride: 102 mmol/L (ref 101–111)
Creatinine, Ser: 1.14 mg/dL — ABNORMAL HIGH (ref 0.44–1.00)
GFR calc Af Amer: 52 mL/min — ABNORMAL LOW (ref >60)
GFR, EST NON AFRICAN AMERICAN: 45 mL/min — AB (ref >60)
Glucose, Bld: 131 mg/dL — ABNORMAL HIGH (ref 65–99)
POTASSIUM: 3.7 mmol/L (ref 3.5–5.1)
SODIUM: 140 mmol/L (ref 135–145)

## 2017-04-07 LAB — CBC
HEMATOCRIT: 39.4 % (ref 36.0–46.0)
HEMOGLOBIN: 13.3 g/dL (ref 12.0–15.0)
MCH: 32.9 pg (ref 26.0–34.0)
MCHC: 33.8 g/dL (ref 30.0–36.0)
MCV: 97.5 fL (ref 78.0–100.0)
Platelets: 289 10*3/uL (ref 150–400)
RBC: 4.04 MIL/uL (ref 3.87–5.11)
RDW: 13.3 % (ref 11.5–15.5)
WBC: 7 10*3/uL (ref 4.0–10.5)

## 2017-04-07 SURGERY — BREAST LUMPECTOMY WITH RADIOACTIVE SEED AND SENTINEL LYMPH NODE BIOPSY
Anesthesia: General | Site: Breast | Laterality: Left

## 2017-04-07 MED ORDER — BUPIVACAINE-EPINEPHRINE (PF) 0.5% -1:200000 IJ SOLN
INTRAMUSCULAR | Status: DC | PRN
  Administered 2017-04-07: 30 mL

## 2017-04-07 MED ORDER — ACETAMINOPHEN 500 MG PO TABS: 1000.0000 mg | ORAL_TABLET | ORAL | Status: DC

## 2017-04-07 MED ORDER — FENTANYL CITRATE (PF) 100 MCG/2ML IJ SOLN
INTRAMUSCULAR | Status: AC
  Filled 2017-04-07: qty 2

## 2017-04-07 MED ORDER — TRAMADOL HCL 50 MG PO TABS
50.0000 mg | ORAL_TABLET | Freq: Four times a day (QID) | ORAL | Status: DC | PRN
  Administered 2017-04-07: 50 mg via ORAL

## 2017-04-07 MED ORDER — MIDAZOLAM HCL 2 MG/2ML IJ SOLN: 2.0000 mg | Freq: Once | INTRAMUSCULAR | Status: DC

## 2017-04-07 MED ORDER — PROPOFOL 10 MG/ML IV BOLUS
INTRAVENOUS | Status: DC | PRN
  Administered 2017-04-07: 30 mg via INTRAVENOUS
  Administered 2017-04-07: 120 mg via INTRAVENOUS
  Administered 2017-04-07: 40 mg via INTRAVENOUS

## 2017-04-07 MED ORDER — PROPOFOL 10 MG/ML IV BOLUS
INTRAVENOUS | Status: AC
  Filled 2017-04-07: qty 20

## 2017-04-07 MED ORDER — FENTANYL CITRATE (PF) 100 MCG/2ML IJ SOLN
100.0000 ug | Freq: Once | INTRAMUSCULAR | Status: AC
  Administered 2017-04-07: 50 ug via INTRAVENOUS

## 2017-04-07 MED ORDER — GABAPENTIN 300 MG PO CAPS: 300.0000 mg | ORAL_CAPSULE | ORAL | Status: DC

## 2017-04-07 MED ORDER — CLINDAMYCIN PHOSPHATE 900 MG/50ML IV SOLN
900.0000 mg | INTRAVENOUS | Status: AC
  Administered 2017-04-07: 900 mg via INTRAVENOUS

## 2017-04-07 MED ORDER — ONDANSETRON HCL 4 MG/2ML IJ SOLN
4.0000 mg | Freq: Once | INTRAMUSCULAR | Status: DC | PRN
Start: 2017-04-07 — End: 2017-04-07

## 2017-04-07 MED ORDER — HYDROCODONE-ACETAMINOPHEN 5-325 MG PO TABS: 1.0000 | ORAL_TABLET | Freq: Four times a day (QID) | ORAL | 0 refills | Status: DC | PRN

## 2017-04-07 MED ORDER — LIDOCAINE 2% (20 MG/ML) 5 ML SYRINGE
INTRAMUSCULAR | Status: AC
  Filled 2017-04-07: qty 10

## 2017-04-07 MED ORDER — TECHNETIUM TC 99M SULFUR COLLOID FILTERED
1.0000 | Freq: Once | INTRAVENOUS | Status: AC | PRN
  Administered 2017-04-07: 1 via INTRADERMAL

## 2017-04-07 MED ORDER — ONDANSETRON HCL 4 MG/2ML IJ SOLN
INTRAMUSCULAR | Status: DC | PRN
  Administered 2017-04-07: 4 mg via INTRAVENOUS

## 2017-04-07 MED ORDER — LIDOCAINE HCL (CARDIAC) 20 MG/ML IV SOLN
INTRAVENOUS | Status: DC | PRN
  Administered 2017-04-07: 80 mg via INTRATRACHEAL

## 2017-04-07 MED ORDER — IBUPROFEN 800 MG PO TABS: 800.0000 mg | ORAL_TABLET | Freq: Three times a day (TID) | ORAL | 0 refills | Status: DC | PRN

## 2017-04-07 MED ORDER — BUPIVACAINE-EPINEPHRINE 0.25% -1:200000 IJ SOLN
INTRAMUSCULAR | Status: DC | PRN
  Administered 2017-04-07: 20 mL

## 2017-04-07 MED ORDER — MIDAZOLAM HCL 2 MG/2ML IJ SOLN
2.0000 mg | Freq: Once | INTRAMUSCULAR | Status: AC
  Administered 2017-04-07: 1 mg via INTRAVENOUS

## 2017-04-07 MED ORDER — DEXAMETHASONE SODIUM PHOSPHATE 10 MG/ML IJ SOLN
INTRAMUSCULAR | Status: DC | PRN
  Administered 2017-04-07: 10 mg via INTRAVENOUS

## 2017-04-07 MED ORDER — CHLORHEXIDINE GLUCONATE CLOTH 2 % EX PADS: 6.0000 | MEDICATED_PAD | Freq: Once | CUTANEOUS | Status: DC

## 2017-04-07 MED ORDER — MIDAZOLAM HCL 2 MG/2ML IJ SOLN
INTRAMUSCULAR | Status: AC
  Administered 2017-04-07: 1 mg via INTRAVENOUS
  Filled 2017-04-07: qty 2

## 2017-04-07 MED ORDER — CELECOXIB 200 MG PO CAPS: 200.0000 mg | ORAL_CAPSULE | ORAL | Status: DC

## 2017-04-07 MED ORDER — FENTANYL CITRATE (PF) 100 MCG/2ML IJ SOLN
INTRAMUSCULAR | Status: AC
  Administered 2017-04-07: 50 ug via INTRAVENOUS
  Filled 2017-04-07: qty 2

## 2017-04-07 MED ORDER — OXYCODONE HCL 5 MG/5ML PO SOLN: 5.0000 mg | Freq: Once | ORAL | Status: DC | PRN

## 2017-04-07 MED ORDER — 0.9 % SODIUM CHLORIDE (POUR BTL) OPTIME
TOPICAL | Status: DC | PRN
  Administered 2017-04-07: 1000 mL

## 2017-04-07 MED ORDER — PHENYLEPHRINE HCL 10 MG/ML IJ SOLN
INTRAMUSCULAR | Status: DC | PRN
  Administered 2017-04-07: 80 ug via INTRAVENOUS
  Administered 2017-04-07: 160 ug via INTRAVENOUS

## 2017-04-07 MED ORDER — OXYCODONE HCL 5 MG PO TABS: 5.0000 mg | ORAL_TABLET | Freq: Once | ORAL | Status: DC | PRN

## 2017-04-07 MED ORDER — FENTANYL CITRATE (PF) 100 MCG/2ML IJ SOLN
25.0000 ug | INTRAMUSCULAR | Status: DC | PRN
  Administered 2017-04-07: 50 ug via INTRAVENOUS
  Administered 2017-04-07 (×2): 25 ug via INTRAVENOUS

## 2017-04-07 MED ORDER — TRAMADOL HCL 50 MG PO TABS
ORAL_TABLET | ORAL | Status: DC
Start: 2017-04-07 — End: 2017-04-07
  Filled 2017-04-07: qty 1

## 2017-04-07 MED ORDER — LACTATED RINGERS IV SOLN
INTRAVENOUS | Status: DC | PRN
  Administered 2017-04-07 (×2): via INTRAVENOUS

## 2017-04-07 MED ORDER — CLINDAMYCIN PHOSPHATE 900 MG/50ML IV SOLN
INTRAVENOUS | Status: AC
  Filled 2017-04-07: qty 50

## 2017-04-07 MED ORDER — BUPIVACAINE-EPINEPHRINE 0.25% -1:200000 IJ SOLN
INTRAMUSCULAR | Status: AC
  Filled 2017-04-07: qty 1

## 2017-04-07 MED ORDER — FENTANYL CITRATE (PF) 100 MCG/2ML IJ SOLN: 100.0000 ug | Freq: Once | INTRAMUSCULAR | Status: DC

## 2017-04-07 SURGICAL SUPPLY — 50 items
APPLIER CLIP 9.375 MED OPEN (MISCELLANEOUS) ×2
BINDER BREAST LRG (GAUZE/BANDAGES/DRESSINGS) IMPLANT
BINDER BREAST XLRG (GAUZE/BANDAGES/DRESSINGS) IMPLANT
BLADE SURG 15 STRL LF DISP TIS (BLADE) ×2 IMPLANT
BLADE SURG 15 STRL SS (BLADE) ×2
CANISTER SUCT 3000ML PPV (MISCELLANEOUS) ×2 IMPLANT
CHLORAPREP W/TINT 26ML (MISCELLANEOUS) ×2 IMPLANT
CLIP APPLIE 9.375 MED OPEN (MISCELLANEOUS) ×1 IMPLANT
CONT SPEC 4OZ CLIKSEAL STRL BL (MISCELLANEOUS) ×2 IMPLANT
COVER PROBE W GEL 5X96 (DRAPES) ×2 IMPLANT
COVER SURGICAL LIGHT HANDLE (MISCELLANEOUS) ×2 IMPLANT
DERMABOND ADVANCED (GAUZE/BANDAGES/DRESSINGS) ×2
DERMABOND ADVANCED .7 DNX12 (GAUZE/BANDAGES/DRESSINGS) ×2 IMPLANT
DEVICE DUBIN SPECIMEN MAMMOGRA (MISCELLANEOUS) ×2 IMPLANT
DRAPE CHEST BREAST 15X10 FENES (DRAPES) ×2 IMPLANT
DRAPE UTILITY XL STRL (DRAPES) ×4 IMPLANT
ELECT CAUTERY BLADE 6.4 (BLADE) ×2 IMPLANT
ELECT REM PT RETURN 9FT ADLT (ELECTROSURGICAL) ×2
ELECTRODE REM PT RTRN 9FT ADLT (ELECTROSURGICAL) ×1 IMPLANT
GLOVE BIO SURGEON STRL SZ8 (GLOVE) ×2 IMPLANT
GLOVE BIOGEL PI IND STRL 6.5 (GLOVE) ×1 IMPLANT
GLOVE BIOGEL PI IND STRL 7.0 (GLOVE) ×1 IMPLANT
GLOVE BIOGEL PI IND STRL 8 (GLOVE) ×2 IMPLANT
GLOVE BIOGEL PI INDICATOR 6.5 (GLOVE) ×1
GLOVE BIOGEL PI INDICATOR 7.0 (GLOVE) ×1
GLOVE BIOGEL PI INDICATOR 8 (GLOVE) ×2
GLOVE ECLIPSE 8.0 STRL XLNG CF (GLOVE) ×2 IMPLANT
GOWN STRL REUS W/ TWL LRG LVL3 (GOWN DISPOSABLE) ×2 IMPLANT
GOWN STRL REUS W/ TWL XL LVL3 (GOWN DISPOSABLE) ×1 IMPLANT
GOWN STRL REUS W/TWL LRG LVL3 (GOWN DISPOSABLE) ×2
GOWN STRL REUS W/TWL XL LVL3 (GOWN DISPOSABLE) ×1
KIT BASIN OR (CUSTOM PROCEDURE TRAY) ×2 IMPLANT
KIT MARKER MARGIN INK (KITS) ×2 IMPLANT
LIGHT WAVEGUIDE WIDE FLAT (MISCELLANEOUS) IMPLANT
NEEDLE 18GX1X1/2 (RX/OR ONLY) (NEEDLE) IMPLANT
NEEDLE FILTER BLUNT 18X 1/2SAF (NEEDLE)
NEEDLE FILTER BLUNT 18X1 1/2 (NEEDLE) IMPLANT
NEEDLE HYPO 25GX1X1/2 BEV (NEEDLE) ×2 IMPLANT
NS IRRIG 1000ML POUR BTL (IV SOLUTION) ×2 IMPLANT
PACK SURGICAL SETUP 50X90 (CUSTOM PROCEDURE TRAY) ×2 IMPLANT
PENCIL BUTTON HOLSTER BLD 10FT (ELECTRODE) ×2 IMPLANT
SPONGE LAP 18X18 X RAY DECT (DISPOSABLE) ×2 IMPLANT
SUT MNCRL AB 4-0 PS2 18 (SUTURE) ×2 IMPLANT
SUT VIC AB 3-0 SH 18 (SUTURE) ×2 IMPLANT
SYR BULB 3OZ (MISCELLANEOUS) ×2 IMPLANT
SYR CONTROL 10ML LL (SYRINGE) ×2 IMPLANT
TOWEL OR 17X24 6PK STRL BLUE (TOWEL DISPOSABLE) ×2 IMPLANT
TOWEL OR 17X26 10 PK STRL BLUE (TOWEL DISPOSABLE) ×2 IMPLANT
TUBE CONNECTING 12X1/4 (SUCTIONS) ×2 IMPLANT
YANKAUER SUCT BULB TIP NO VENT (SUCTIONS) ×2 IMPLANT

## 2017-04-07 NOTE — Op Note (Signed)
Preoperative diagnosis: Stage I left breast cancer  Postoperative diagnosis: Same  Procedure: Left breast seed localized partial mastectomy and left axillary sentinel lymph node mapping  Surgeon: Erroll Luna MD  Anesthesia: LMA with pectoral block and local  EBL: 20 cc  Specimen: Left breast mass with seed and clip verified by radiography.  1 left axillary sentinel nodes to pathology from the left axillary level 1 lymph node basin  Drains: None  IV: Fluids: Per anesthesia record  Indications for procedure: The patient presents for left breast lumpectomy for stage I left breast cancer that was discovered by routine screening mammography and core biopsy.  This is in her left upper outer quadrant.  We discussed surgical options and treatments.  We discussed possible other treatments to include mastectomy to be she is opted for breast conservation.The procedure has been discussed with the patient. Alternatives to surgery have been discussed with the patient.  Risks of surgery include bleeding,  Infection,  Seroma formation, death,  and the need for further surgery.   The patient understands and wishes to proceed.Sentinel lymph node mapping and dissection has been discussed with the patient.  Risk of bleeding,  Infection,  Seroma formation,  Additional procedures,,  Shoulder weakness ,  Shoulder stiffness,  Nerve and blood vessel injury and reaction to the mapping dyes have been discussed.  Alternatives to surgery have been discussed with the patient.  The patient agrees to proceed.    Description of procedure: The patient was met in the holding area and the left side was marked as the correct side.  She had seed placement done as an outpatient.  She underwent injection by nuclear medicine for mapping of the left breast with technetium sulfur colloid.  All questions were answered.  She was taken back to the operating room and placed upon the OR table.  After induction of LMA anesthesia, the left  breast was prepped and draped in sterile fashion.  Of note she underwent a pectoral block prior to being brought back to the operative room the left side.  After sterile prep and drape, a timeout was done to verify proper patient, side and procedure.  Neoprobe was used and the seed was localized left breast upper outer quadrant.  Curvilinear incision was made over the spine and all tissue around the seed and clip were excised with grossly negative margins.  The cavity was made hemostatic.  Breast tissue was mobilized from the underlying skin to facilitate closure.  This was done with cautery.  Hemostasis achieved.  The cavity was marked with clips and closed in multiple layers using 3-0 Vicryl.  4-0 Monocryl was used to close skin.  The neoprobe was switched to technetium settings.  The hot spot was identified the left axilla.  Local anesthetic was infiltrated.  A 3 cm incision was made in the left axilla.  Dissection was carried down into the level 1 contents of the deep left axillary lymph node basin.  There was a single hot sentinel node identified and excised.  Background counts approaches 0.  Wound was made hemostatic with cautery closed with 3-0 Vicryl and 4-0 Monocryl.  Dermabond applied to the incisions.  All final counts found to be correct.  Breast binder placed.  The patient was awoke extubated taken recovery in satisfactory condition.

## 2017-04-07 NOTE — Transfer of Care (Signed)
Immediate Anesthesia Transfer of Care Note  Patient: KEIERRA NUDO  Procedure(s) Performed: BREAST LUMPECTOMY WITH RADIOACTIVE SEED AND SENTINEL LYMPH NODE BIOPSY (Left Breast)  Patient Location: PACU  Anesthesia Type:GA combined with regional for post-op pain  Level of Consciousness: awake, alert  and oriented  Airway & Oxygen Therapy: Patient Spontanous Breathing and Patient connected to nasal cannula oxygen  Post-op Assessment: Report given to RN, Post -op Vital signs reviewed and stable and Patient moving all extremities X 4  Post vital signs: Reviewed and stable  Last Vitals:  Vitals:   04/07/17 0803 04/07/17 1158  BP:  126/89  Pulse: (!) 106 79  Resp: 20 12  Temp: (!) 36.3 C 36.6 C  SpO2: 97% 98%    Last Pain:  Vitals:   04/07/17 0803  TempSrc: Oral      Patients Stated Pain Goal: 1 (05/11/20 4825)  Complications: No apparent anesthesia complications

## 2017-04-07 NOTE — Interval H&P Note (Signed)
History and Physical Interval Note:  04/07/2017 8:30 AM  Heidi Lutz  has presented today for surgery, with the diagnosis of LEFT BREAST CANCER  The various methods of treatment have been discussed with the patient and family. After consideration of risks, benefits and other options for treatment, the patient has consented to  Procedure(s): BREAST LUMPECTOMY WITH RADIOACTIVE SEED AND SENTINEL LYMPH NODE BIOPSY (Left) as a surgical intervention .  The patient's history has been reviewed, patient examined, no change in status, stable for surgery.  I have reviewed the patient's chart and labs.  Questions were answered to the patient's satisfaction.     [place]

## 2017-04-07 NOTE — Anesthesia Postprocedure Evaluation (Signed)
Anesthesia Post Note  Patient: [name]  Procedure(s) Performed: BREAST LUMPECTOMY WITH RADIOACTIVE SEED AND SENTINEL LYMPH NODE BIOPSY (Left Breast)     Patient location during evaluation: PACU Anesthesia Type: General Level of consciousness: awake and alert Pain management: pain level controlled Vital Signs Assessment: post-procedure vital signs reviewed and stable Respiratory status: spontaneous breathing, nonlabored ventilation, respiratory function stable and patient connected to nasal cannula oxygen Cardiovascular status: blood pressure returned to baseline and stable Postop Assessment: no apparent nausea or vomiting Anesthetic complications: no    Last Vitals:  Vitals:   04/07/17 1245 04/07/17 1315  BP: (!) 139/99 (!) 143/84  Pulse: 88 79  Resp: (!) 22 17  Temp:    SpO2: 92% 99%                   Audry Pili

## 2017-04-07 NOTE — Anesthesia Preprocedure Evaluation (Addendum)
Anesthesia Evaluation  Patient identified by MRN, date of birth, ID band Patient awake    Reviewed: Allergy & Precautions, NPO status , Patient's Chart, lab work & pertinent test results  History of Anesthesia Complications (+) PONV  Airway Mallampati: III  TM Distance: >3 FB Neck ROM: Full    Dental  (+) Dental Advisory Given, Teeth Intact   Pulmonary former smoker,    Pulmonary exam normal breath sounds clear to auscultation       Cardiovascular + Peripheral Vascular Disease  Normal cardiovascular exam Rhythm:Regular Rate:Normal  Nuc Stress 03/2017 -  Probable normal perfusion and soft tissue attenuation (diaphragm), cannot exclude subendocardial scar. No ischemia Nuclear stress EF: 61%. Blood pressure demonstrated a hypertensive response to exercise. EKG was nondiagnostic due to baseline changes This is a low risk study  EKG - LBBB - evaluated by cardiology, no further workup recommended (aside from above stress test)   Neuro/Psych negative neurological ROS  negative psych ROS   GI/Hepatic negative GI ROS, Neg liver ROS,   Endo/Other  negative endocrine ROS  Renal/GU negative Renal ROS  negative genitourinary   Musculoskeletal  (+) Arthritis , Osteoarthritis,    Abdominal   Peds  Hematology negative hematology ROS (+)   Anesthesia Other Findings Raynaud's disease; left breast cancer  Reproductive/Obstetrics                            Anesthesia Physical Anesthesia Plan  ASA: II  Anesthesia Plan: General   Post-op Pain Management:  Regional for Post-op pain   Induction: Intravenous  PONV Risk Score and Plan: 4 or greater and Treatment may vary due to age or medical condition, Dexamethasone, Ondansetron and Propofol infusion  Airway Management Planned: LMA  Additional Equipment: None  Intra-op Plan:   Post-operative Plan: Extubation in OR  Informed Consent: I have  reviewed the patients History and Physical, chart, labs and discussed the procedure including the risks, benefits and alternatives for the proposed anesthesia with the patient or authorized representative who has indicated his/her understanding and acceptance.   Dental advisory given  Plan Discussed with: CRNA  Anesthesia Plan Comments:        Anesthesia Quick Evaluation

## 2017-04-07 NOTE — Anesthesia Procedure Notes (Signed)
Procedures

## 2017-04-07 NOTE — Anesthesia Procedure Notes (Signed)
Anesthesia Regional Block: Pectoralis block   Pre-Anesthetic Checklist: ,, timeout performed, Correct Patient, Correct Site, Correct Laterality, Correct Procedure, Correct Position, site marked, Risks and benefits discussed,  Surgical consent,  Pre-op evaluation,  At surgeon's request and post-op pain management  Laterality: Left  Prep: chloraprep       Needles:  Injection technique: Single-shot  Needle Type: Echogenic Needle     Needle Length: 9cm  Needle Gauge: 21     Additional Needles:   Narrative:  Start time: 04/07/2017 9:38 AM End time: 04/07/2017 9:42 AM Injection made incrementally with aspirations every 5 mL.  Performed by: Personally  Anesthesiologist: Audry Pili, MD  Additional Notes: No pain on injection. No increased resistance to injection. Injection made in 5cc increments. Good needle visualization. Patient tolerated the procedure well.

## 2017-04-07 NOTE — Discharge Instructions (Signed)
[facility] °Office Phone Number [phone] ° °BREAST BIOPSY/ PARTIAL MASTECTOMY: POST OP INSTRUCTIONS ° °Always review your discharge instruction sheet given to you by the facility where your surgery was performed. ° °IF YOU HAVE DISABILITY OR FAMILY LEAVE FORMS, YOU MUST BRING THEM TO THE OFFICE FOR PROCESSING.  DO NOT GIVE THEM TO YOUR DOCTOR. ° °1. A prescription for pain medication may be given to you upon discharge.  Take your pain medication as prescribed, if needed.  If narcotic pain medicine is not needed, then you may take acetaminophen (Tylenol) or ibuprofen (Advil) as needed. °2. Take your usually prescribed medications unless otherwise directed °3. If you need a refill on your pain medication, please contact your pharmacy.  They will contact our office to request authorization.  Prescriptions will not be filled after 5pm or on week-ends. °4. You should eat very light the first 24 hours after surgery, such as soup, crackers, pudding, etc.  Resume your normal diet the day after surgery. °5. Most patients will experience some swelling and bruising in the breast.  Ice packs and a good support bra will help.  Swelling and bruising can take several days to resolve.  °6. It is common to experience some constipation if taking pain medication after surgery.  Increasing fluid intake and taking a stool softener will usually help or prevent this problem from occurring.  A mild laxative (Milk of Magnesia or Miralax) should be taken according to package directions if there are no bowel movements after 48 hours. °7. Unless discharge instructions indicate otherwise, you may remove your bandages 24-48 hours after surgery, and you may shower at that time.  You may have steri-strips (small skin tapes) in place directly over the incision.  These strips should be left on the skin for 7-10 days.  If your surgeon used skin glue on the incision, you may shower in 24 hours.  The glue will flake off over the  next 2-3 weeks.  Any sutures or staples will be removed at the office during your follow-up visit. °8. ACTIVITIES:  You may resume regular daily activities (gradually increasing) beginning the next day.  Wearing a good support bra or sports bra minimizes pain and swelling.  You may have sexual intercourse when it is comfortable. °a. You may drive when you no longer are taking prescription pain medication, you can comfortably wear a seatbelt, and you can safely maneuver your car and apply brakes. °b. RETURN TO WORK:  ______________________________________________________________________________________ °9. You should see your doctor in the office for a follow-up appointment approximately two weeks after your surgery.  Your doctor’s nurse will typically make your follow-up appointment when she calls you with your pathology report.  Expect your pathology report 2-3 business days after your surgery.  You may call to check if you do not hear from us after three days. °10. OTHER INSTRUCTIONS: _______________________________________________________________________________________________ _____________________________________________________________________________________________________________________________________ °_____________________________________________________________________________________________________________________________________ °_____________________________________________________________________________________________________________________________________ ° °WHEN TO CALL YOUR DOCTOR: °1. Fever over 101.0 °2. Nausea and/or vomiting. °3. Extreme swelling or bruising. °4. Continued bleeding from incision. °5. Increased pain, redness, or drainage from the incision. ° °The clinic staff is available to answer your questions during regular business hours.  Please don’t hesitate to call and ask to speak to one of the nurses for clinical concerns.  If you have a medical emergency, go to the nearest  emergency room or call 911.  A surgeon from [facility] is always on call at the hospital. ° °For further questions, please visit [email or web address]  °

## 2017-04-08 ENCOUNTER — Encounter (HOSPITAL_COMMUNITY): Payer: Self-pay | Admitting: Surgery

## 2017-04-10 ENCOUNTER — Encounter (HOSPITAL_COMMUNITY): Payer: Self-pay | Admitting: Surgery

## 2017-04-10 ENCOUNTER — Telehealth: Payer: Self-pay | Admitting: *Deleted

## 2017-04-10 NOTE — Telephone Encounter (Signed)
Received order for oncotype testing. Requisition sent to pathology. Received by Keisha 

## 2017-04-12 NOTE — Progress Notes (Signed)
I called Janya and told her that given the size of the tumor as well as other considerations I do not feel an Oncotype is necessary.  She can move straight on to radiation and she will not.  She was very pleased with this result.  I will request an appointment with Dr. Lisbeth Renshaw so she can begin to operational lysed this.

## 2017-04-13 ENCOUNTER — Encounter: Payer: Self-pay | Admitting: Radiation Oncology

## 2017-04-13 NOTE — Progress Notes (Signed)
Location of Breast Cancer:central portion of left breast in female   FUN after surgery.  Histology per Pathology Report:  03/08/17 Diagnosis Breast, left, needle core biopsy, lower outer - INVASIVE MAMMARY CARCINOMA, SEE COMMENT.  Receptor Status: ER(100%), PR (70%), Her2-neu (NEG), Ki-(2%)  Did patient present with symptoms or was this found on screening mammography?: It was found on a screening mammogram.   Past/Anticipated interventions by surgeon, if any: Diagnosis 04-07-17 Dr. Marcello Moores Cornett 1. 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 - THE SURGICAL RESECTION MARGINS ARE NEGATIVE FOR CARCINOMA. - SEE ONCOLOGY TABLE BELOW. 2. Lymph node, sentinel, biopsy, Left - THERE IS NO EVIDENCE OF CARCINOMA IN 1 OF 1 LYMPH NODE (0/1).  Receptor Status: ER(100%), PR (70%), Her2-neu (Neg ratio was 1.39), Ki-(2%)  Surgery scheduled for 03/21/17- Dr. Brantley Stage   Past/Anticipated interventions by medical oncology, if any: 03-17-17 Dr. Jana Hakim Oncotype DX to be obtained from the definitive surgical sample consider adjuvant radiation  tamoxifen to start at the end of local treatment  Lymphedema issues, if any:  No Rom to left arm good Skin to left breast healing without signs of infection.  Has a  follow up appointment with Dr. Brantley Stage on Monday, April 24, 2017.  Pain issues, if any: 1/10 left breast taking Tylenol or Tramdal  SAFETY ISSUES:  Prior radiation? No  Pacemaker/ICD? No  Possible current pregnancy? N/A  Is the patient on methotrexate? No  Menarche 12   G2 P2    BC N/A  Menopause hysterectomy at 29   HRT yes, 5 years, including progesterone  Wt Readings from Last 3 Encounters:  04/18/17 140 lb (63.5 kg)  04/07/17 133 lb (60.3 kg)  03/24/17 130 lb (59 kg)  BP 124/70 (BP Location: Right Arm, Patient Position: Sitting, Cuff Size: Normal)   Pulse 88   Temp 98.2 F (36.8 C) (Oral)   Resp 18   Ht '5\' 3"'$  (1.6 m)    Wt 140 lb (63.5 kg)   SpO2 100%   BMI 24.80 kg/m  Current Complaints / other details:

## 2017-04-18 ENCOUNTER — Encounter: Payer: Self-pay | Admitting: Radiation Oncology

## 2017-04-18 ENCOUNTER — Ambulatory Visit
Admission: RE | Admit: 2017-04-18 | Discharge: 2017-04-18 | Disposition: A | Discharge status: Home / Self Care | Payer: Medicare Other | Source: Ambulatory Visit | Attending: Radiation Oncology | Admitting: Radiation Oncology

## 2017-04-18 ENCOUNTER — Other Ambulatory Visit: Payer: Self-pay

## 2017-04-18 VITALS — BP 124/70 | HR 88 | Temp 98.2°F | Resp 18 | Ht 63.0 in | Wt 140.0 lb

## 2017-04-18 DIAGNOSIS — E7800 Pure hypercholesterolemia, unspecified: Secondary | ICD-10-CM | POA: Insufficient documentation

## 2017-04-18 DIAGNOSIS — Z85820 Personal history of malignant melanoma of skin: Secondary | ICD-10-CM | POA: Diagnosis not present

## 2017-04-18 DIAGNOSIS — Z79899 Other long term (current) drug therapy: Secondary | ICD-10-CM | POA: Insufficient documentation

## 2017-04-18 DIAGNOSIS — Z170 Estrogen receptor positive status [ER+]: Secondary | ICD-10-CM | POA: Diagnosis not present

## 2017-04-18 DIAGNOSIS — M1990 Unspecified osteoarthritis, unspecified site: Secondary | ICD-10-CM | POA: Diagnosis not present

## 2017-04-18 DIAGNOSIS — Z87891 Personal history of nicotine dependence: Secondary | ICD-10-CM | POA: Diagnosis not present

## 2017-04-18 DIAGNOSIS — C50112 Malignant neoplasm of central portion of left female breast: Secondary | ICD-10-CM | POA: Insufficient documentation

## 2017-04-18 DIAGNOSIS — I7300 Raynaud's syndrome without gangrene: Secondary | ICD-10-CM | POA: Diagnosis not present

## 2017-04-18 DIAGNOSIS — M069 Rheumatoid arthritis, unspecified: Secondary | ICD-10-CM | POA: Diagnosis not present

## 2017-04-18 DIAGNOSIS — Z7982 Long term (current) use of aspirin: Secondary | ICD-10-CM | POA: Insufficient documentation

## 2017-04-18 NOTE — Progress Notes (Signed)
Radiation Oncology         [phone] ________________________________  Name: Heidi Lutz        MRN: [id number]  Date of Service: 04/18/2017 DOB: 1940-10-23  BJ:SEGBTDV, Jannifer Rodney, MD  Magrinat, [name], MD     REFERRING PHYSICIAN: Magrinat, [name], MD   DIAGNOSIS: The encounter diagnosis was Malignant neoplasm of central portion of left breast in female, estrogen receptor positive ([place]).   HISTORY OF PRESENT ILLNESS: Heidi Lutz is a 77 y.o. female originally seen at the request of Dr. Brantley Stage for a new diagnosis of left breast cancer. The patient was noted to have a possible asymmetry in the left breast on screening mammogram at the beginning of the month. Diagnostic imaging on 03/03/17 revealed an oval mass in the left breast at 3:30 measuring 8 x 5 x 7 mm with an adjacent simple appearing cyst. The axilla was negative for adenopathy. She had a biopsy on 03/08/17 revealed a grade 1 invasive ductal carcinoma, ER/PR positive, HER2 negative with a Ki 67 of 2%.   She underwent surgical resection with lumpectomy and sentinel node evaluation on 04/07/16. Final pathology revealed an 8 mm grade 2 invasive ductal carcinoma. Her margins and lymph nodes were negative. She comes back to review the options of radiotherapy.   PREVIOUS RADIATION THERAPY: No   PAST MEDICAL HISTORY:  Past Medical History:  Diagnosis Date  . Cancer ([place]) 01/2017   left breast cancer, basal cell and 1 melanoma  . Complication of anesthesia    heart rate spikeed in the PACU on her 2nd knee surgery  . Environmental allergies   . High cholesterol   . Osteoarthritis   . PONV (postoperative nausea and vomiting)    with 1st knee surgery had n/v, none since  . Raynaud disease    Raynauds disease-takes procardia  . Rheumatoid arthritis ([place])        PAST SURGICAL HISTORY: Past Surgical History:  Procedure Laterality Date  . ABDOMINAL HYSTERECTOMY  1972  . APPENDECTOMY  1953  . BREAST LUMPECTOMY WITH  RADIOACTIVE SEED AND SENTINEL LYMPH NODE BIOPSY Left 04/07/2017   Procedure: BREAST LUMPECTOMY WITH RADIOACTIVE SEED AND SENTINEL LYMPH NODE BIOPSY;  Surgeon: Erroll Luna, MD;  Location: [facility];  Service: General;  Laterality: Left;  . BUNIONECTOMY  1999  . COLONOSCOPY    . EYE SURGERY     bil cataract  . GANGLION CYST EXCISION  1968  . JOINT REPLACEMENT Left 2017    reverse shoulder replacement  . REPLACEMENT TOTAL KNEE  2012, 2013   Right and Left  . SHOULDER ARTHROSCOPY Left   . SYMPATHECTOMY  1970  . TONSILLECTOMY  1949     FAMILY HISTORY:  Family History  Adopted: Yes  Problem Relation Age of Onset  . Breast cancer Neg Hx      SOCIAL HISTORY:  reports that she quit smoking about 54 years ago. she has never used smokeless tobacco. She reports that she drinks about 8.4 oz of alcohol per week. She reports that she does not use drugs. The patient is married and lives in [place], and she is a retired Education officer, museum. She is accompanied by her husband and daughter.    ALLERGIES: Fluorouracil; Codeine; Hydrocodone; and Penicillins   MEDICATIONS:  Current Outpatient Medications  Medication Sig Dispense Refill  . acetaminophen (TYLENOL) 500 MG tablet Take 1,000 mg by mouth every 6 (six) hours as needed for moderate pain or headache.     Marland Kitchen  aspirin EC 81 MG tablet Take 81 mg by mouth daily.    Marland Kitchen atorvastatin (LIPITOR) 20 MG tablet Take 20 mg by mouth daily.    . Biotin (SUPER BIOTIN) 5 MG TABS Take 5 mg by mouth daily.     . Black Cohosh 540 MG CAPS Take 540 mg by mouth daily.     . calcium-vitamin D (OSCAL WITH D) 500-200 MG-UNIT TABS tablet Take 1 tablet by mouth daily.     . cetirizine (ZYRTEC) 10 MG tablet Take 10 mg by mouth daily.    . Cholecalciferol (VITAMIN D) 2000 units CAPS Take 2,000 Units by mouth daily.     Marland Kitchen co-enzyme Q-10 50 MG capsule Take 50 mg by mouth daily.     Marland Kitchen CRANBERRY PO Take 1 capsule by mouth daily.     Marland Kitchen dicyclomine (BENTYL) 10 MG capsule Take 10  mg by mouth twice daily  1  . Glucosamine-Chondroit-Vit C-Mn (GLUCOSAMINE-CHONDROITIN MAX ST) CAPS Take 1 capsule by mouth daily.     . Magnesium 200 MG TABS Take 200 mg by mouth daily.     . Multiple Vitamins-Minerals (CENTRUM ADULTS PO) Take 1 tablet by mouth daily.     Marland Kitchen NIFEdipine (PROCARDIA XL/ADALAT-CC) 90 MG 24 hr tablet Take 90 mg by mouth daily.     Vladimir Faster Glycol-Propyl Glycol (SYSTANE OP) Place 1 drop into both eyes daily.    . Probiotic Product (PROBIOTIC ADVANCED PO) Take 1 capsule by mouth daily.     . tamoxifen (NOLVADEX) 20 MG tablet TAKE 1 TABLET(20 MG) BY MOUTH DAILY 90 tablet 1  . traMADol (ULTRAM) 50 MG tablet Take 50 mg by mouth 2 (two) times daily as needed for moderate pain.     Marland Kitchen ibuprofen (ADVIL,MOTRIN) 800 MG tablet Take 1 tablet (800 mg total) by mouth every 8 (eight) hours as needed. (Patient not taking: Reported on 04/18/2017) 30 tablet 0  . OVER THE COUNTER MEDICATION Apply 1 application topically daily. MenoBalance Cream    . zolpidem (AMBIEN) 5 MG tablet Take 5 mg by mouth at bedtime as needed for sleep.   0   No current facility-administered medications for this encounter.      REVIEW OF SYSTEMS: On review of systems, the patient reports that she is doing well overall. She denies any chest pain, shortness of breath, cough, fevers, chills, night sweats, unintended weight changes. She denies any bowel or bladder disturbances, and denies abdominal pain, nausea or vomiting. She denies any new musculoskeletal or joint aches or pains. A complete review of systems is obtained and is otherwise negative.     PHYSICAL EXAM:  Wt Readings from Last 3 Encounters:  04/18/17 140 lb (63.5 kg)  04/07/17 133 lb (60.3 kg)  03/24/17 130 lb (59 kg)   Temp Readings from Last 3 Encounters:  04/18/17 98.2 F (36.8 C) (Oral)  04/07/17 97.9 F (36.6 C)  03/17/17 (!) 97.3 F (36.3 C) (Oral)   BP Readings from Last 3 Encounters:  04/18/17 124/70  04/07/17 (!) 143/84    03/22/17 (!) 174/96   Pulse Readings from Last 3 Encounters:  04/18/17 88  04/07/17 79  03/22/17 98     In general this is a well appearing caucasian  female in no acute distress. She is alert and oriented x4 and appropriate throughout the examination. HEENT reveals that the patient is normocephalic, atraumatic. EOMs are intact. PERRLA. Skin is intact without any evidence of gross lesions. Cardiopulmonary assessment is negative for acute  distress and she exhibits normal effort. Breast exam on the left reveals a well healed lumpectomy and axillary incision. No erythema or chest wall edema is noted.     ECOG = 0  0 - Asymptomatic (Fully active, able to carry on all predisease activities without restriction)  1 - Symptomatic but completely ambulatory (Restricted in physically strenuous activity but ambulatory and able to carry out work of a light or sedentary nature. For example, light housework, office work)  2 - Symptomatic, <50% in bed during the day (Ambulatory and capable of all self care but unable to carry out any work activities. Up and about more than 50% of waking hours)  3 - Symptomatic, >50% in bed, but not bedbound (Capable of only limited self-care, confined to bed or chair 50% or more of waking hours)  4 - Bedbound (Completely disabled. Cannot carry on any self-care. Totally confined to bed or chair)  5 - Death   Eustace Pen MM, Creech RH, Tormey DC, et al. [phone]). "Toxicity and response criteria of the [facility]". [place] Oncol. 5 (6): 649-55    LABORATORY DATA:  Lab Results  Component Value Date   WBC 7.0 04/07/2017   HGB 13.3 04/07/2017   HCT 39.4 04/07/2017   MCV 97.5 04/07/2017   PLT 289 04/07/2017   Lab Results  Component Value Date   NA 140 04/07/2017   K 3.7 04/07/2017   CL 102 04/07/2017   CO2 26 04/07/2017   No results found for: ALT, AST, GGT, ALKPHOS, BILITOT    RADIOGRAPHY: Nm Sentinel Node Inj-no Rpt  (breast)  Result Date: 04/07/2017 Sulfur colloid was injected by the nuclear medicine technologist for melanoma sentinel node.   Mm Breast Surgical Specimen  Result Date: 04/07/2017 CLINICAL DATA:  Specimen radiograph status post left breast lumpectomy. EXAM: SPECIMEN RADIOGRAPH OF THE LEFT BREAST COMPARISON:  Previous exam(s). FINDINGS: Status post excision of the left breast. The radioactive seed and biopsy marker clip are present, completely intact, and were marked for pathology. These findings were communicated with the OR at 11:22 a.m. IMPRESSION: Specimen radiograph of the left breast. Electronically Signed   By: Ammie Ferrier M.D.   On: 04/07/2017 11:22   Mm Lt Radioactive Seed Loc Mammo Guide  Result Date: 04/04/2017 CLINICAL DATA:  Patient presents for radioactive seed localization a left breast carcinoma. EXAM: MAMMOGRAPHIC GUIDED RADIOACTIVE SEED LOCALIZATION OF THE LEFT BREAST COMPARISON:  Previous exam(s). FINDINGS: Patient presents for radioactive seed localization prior to surgical excision. I met with the patient and we discussed the procedure of seed localization including benefits and alternatives. We discussed the high likelihood of a successful procedure. We discussed the risks of the procedure including infection, bleeding, tissue injury and further surgery. We discussed the low dose of radioactivity involved in the procedure. Informed, written consent was given. The usual time-out protocol was performed immediately prior to the procedure. Using mammographic guidance, sterile technique, 1% lidocaine and an I-125 radioactive seed, the ribbon shaped biopsy clip associated mass were localized using a lateral approach. The follow-up mammogram images confirm the seed in the expected location and were marked for Dr. Brantley Stage. Follow-up survey of the patient confirms the presence of the radioactive seed within the left breast. Order number of I-125 seed:  [id number]. Total activity:  0.236  reference Date: 03/27/2016 The patient tolerated the procedure well and was released from the [place]. She was given instructions regarding seed removal. IMPRESSION: Radioactive seed localization of the left breast.  No apparent complications. Electronically Signed   By: [name] M.D.   On: 04/04/2017 14:36       IMPRESSION/PLAN: 1. Stage IA, [id number] grade 2, ER/PR positive invasive ductal carcinoma of the left breast. Dr. Lisbeth Renshaw discusses the pathology findings from pathology and reviews the role of adjuvant external radiotherapy to the breast. Her course would also be followed by antiestrogen therapy. We discussed the risks, benefits, short, and long term effects of radiotherapy, and the patient is interested in proceeding. Dr. Lisbeth Renshaw discusses the delivery and logistics of radiotherapy and recommends a course of 4 weeks with deep inspiration breath hold. Written consent is obtained and placed in the chart, a copy was provided to the patient. The patient is interested and will be set up for simulation at the end of next week or early the following with the anticipation to start the week of 05/08/17.   In a visit lasting 25 minutes, greater than 50% of the time was spent face to face discussing her findings, and coordinating the patient's care.  The above documentation reflects my direct findings during this shared patient visit. Please see the separate note by Dr. Lisbeth Renshaw on this date for the remainder of the patient's plan of care.    Carola Rhine, PAC

## 2017-04-18 NOTE — Addendum Note (Signed)
Encounter addended by: Hayden Pedro, PA-C on: 04/18/2017 4:30 PM  Actions taken: Follow-up modified

## 2017-04-19 NOTE — Progress Notes (Signed)
[facility]  [phone] [phone]) [phone]     ID: SHAQUETTA ARCOS DOB: [birth year]  MR#: [id number]  QBV#:[id number]  Patient Care Team: Katherina Mires, MD as PCP - General (Family Medicine) Annya Lizana, [name], MD as Consulting Physician (Oncology) Jovita Kussmaul, MD as Consulting Physician (General Surgery) OTHER MD:  CHIEF COMPLAINT: Estrogen receptor positive breast cancer  CURRENT TREATMENT: Tamoxifen; adjuvant radiation pending   HISTORY OF CURRENT ILLNESS: From the original intake note:  Heidi Lutz had routine screening mammography on 02/27/2017 showing a possible asymmetry in the left breast. She underwent unilateral diagnostic mammography with tomography and left breast ultrasonography at [facility] on 03/03/2017 showing: breast density category C. Small left breast mass in the 3:30 o'clock lower outer position 5 cm from the nipple measuring 0.8 x 0.5 x 0.7 cm is suspicious for breast malignancy. The left axilla is negative for lymphadenopathy.   Accordingly on 03/08/2016 she proceeded to biopsy of the left breast mass in question. The pathology from this procedure showed ([id number]): invasive mammary carcinoma. 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 Prognostic indicators significant for: estrogen receptor, 100% positive and progesterone receptor, 70% positive, both with strong staining intensity. Proliferation marker Ki67 at 2%. HER2 not amplified with ratio HER2/CEP17 signals 1.39 and average HER2 copies per cell 1.95.  The patient's subsequent history is as detailed below.  INTERVAL HISTORY: Heidi Lutz returns today for follow up and treatment of her estrogen receptor positive breast cancer accompanied by her husband. She is being followed by radiation oncology. She did well with  Her lumpectomy. She was able to relieve her pain with tramadol for the 1st few days.For the 1st few.  Status post left breat lumpectomy with  sentinel lymph node sampling ([id number]) completed 04/07/2017 with pathology showing: Invasive Ductal carcinoma grade II. Resection margins are negative. There was no evidence of malignancy in 1 lymph node (0/1).    REVIEW OF SYSTEMS: Heidi Lutz reports that she was a history major in high school and college and she enjoys reading history books. 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 For exercise, she goes to an exercise program in her retirement community. She and her husband go to water aerobics about 4 times per week along with taking walks. She notes that they haven't gone to water aerobics since December 2018, because of her husband's recent medical problems, but they are both doing physical therapay exercises. She denies unusual headaches, visual changes, nausea, vomiting, or dizziness. There has been no unusual cough, phlegm production, or pleurisy. This been no change in bowel or bladder habits. She denies unexplained fatigue or unexplained weight loss, bleeding, rash, or fever. A detailed review of systems was otherwise stable.   PAST MEDICAL HISTORY: Past Medical History:  Diagnosis Date  . Cancer ([place]) 01/2017   left breast cancer, basal cell and 1 melanoma  . Complication of anesthesia    heart rate spikeed in the PACU on her 2nd knee surgery  . Environmental allergies   . High cholesterol   . Osteoarthritis   . PONV (postoperative nausea and vomiting)    with 1st knee surgery had n/v, none since  . Raynaud disease    Raynauds disease-takes procardia  . Rheumatoid arthritis ([place])     PAST SURGICAL HISTORY: Past Surgical History:  Procedure Laterality Date  . ABDOMINAL HYSTERECTOMY  1972  . APPENDECTOMY  1953  . BREAST LUMPECTOMY WITH RADIOACTIVE SEED AND SENTINEL LYMPH NODE BIOPSY Left 04/07/2017  Procedure: BREAST LUMPECTOMY WITH RADIOACTIVE SEED AND SENTINEL LYMPH NODE BIOPSY;  Surgeon: Erroll Luna, MD;  Location: [facility];  Service: General;  Laterality: Left;  .  BUNIONECTOMY  1999  . COLONOSCOPY    . EYE SURGERY     bil cataract  . GANGLION CYST EXCISION  1968  . JOINT REPLACEMENT Left 2017    reverse shoulder replacement  . REPLACEMENT TOTAL KNEE  2012, 2013   Right and Left  . SHOULDER ARTHROSCOPY Left   . SYMPATHECTOMY  1970  . TONSILLECTOMY  1949    FAMILY HISTORY Family History  Adopted: Yes  Problem Relation Age of Onset  . Breast cancer Neg Hx   The patient is adopted and has no information regarding her biologic family.  GYNECOLOGIC HISTORY:  No LMP recorded. Patient has had a hysterectomy. Menarche: X years old Age at first live birth: 77 years old [place] P 2 LMP status post hysterectomy at age 42 Contraceptiven/a HRT yes, 5 years, including progesterone  SO?  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  Daughter [name] is a Scientist, research (physical sciences) in [employer].  Daughter [name] is an Location manager in [place].  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 Smoking status: Former Smoker    Last attempt to quit: 02/22/1963    Years since quitting: 54.1  . Smokeless tobacco: Never Used  Substance Use Topics  . Alcohol use: Yes    Alcohol/week: 8.4 oz    Types: 14 Glasses of wine per week    Comment: 2 glasses of wine daily.   . Drug use: No     Colonoscopy: 2014  PAP: Status post hysterectomy  Bone density: 02/27/2017 showed a T scored of -1.8 osteopenia    Allergies  Allergen Reactions  . Fluorouracil Hives  . Codeine Nausea And Vomiting  . Hydrocodone Nausea And Vomiting  . Penicillins Swelling, Rash and Other (See Comments)    Pt states allergic to all "cillin" drugs. Joint pain. Has patient had a PCN reaction causing immediate rash, facial/tongue/throat swelling, SOB or lightheadedness with hypotension:  Yes Has patient had a PCN reaction causing severe rash involving mucus membranes or skin necrosis: No Has patient had a PCN reaction that required hospitalization: No Has patient had a PCN reaction occurring within the last 10 years: No If all of the above answers are "NO", then may proceed with Cephalosporin use.     Current Outpatient Medications  Medication Sig Dispense Refill  . acetaminophen (TYLENOL) 500 MG tablet Take 1,000 mg by mouth every 6 (six) hours as needed for moderate pain or headache.     Marland Kitchen aspirin EC 81 MG tablet Take 81 mg by mouth daily.    Marland Kitchen atorvastatin (LIPITOR) 20 MG tablet Take 20 mg by mouth daily.    . Biotin (SUPER BIOTIN) 5 MG TABS Take 5 mg by mouth daily.     . Black Cohosh 540 MG CAPS Take 540 mg by mouth daily.     . calcium-vitamin D (OSCAL WITH D) 500-200 MG-UNIT TABS tablet Take 1 tablet by mouth daily.     . cetirizine (ZYRTEC) 10 MG tablet Take 10 mg by mouth daily.    . Cholecalciferol (VITAMIN D) 2000 units CAPS Take 2,000 Units  by mouth daily.     Marland Kitchen co-enzyme Q-10 50 MG capsule Take 50 mg by mouth daily.     Marland Kitchen CRANBERRY PO Take 1 capsule by mouth daily.     Marland Kitchen dicyclomine (BENTYL) 10 MG capsule Take 10 mg by mouth twice daily  1  . Glucosamine-Chondroit-Vit C-Mn (GLUCOSAMINE-CHONDROITIN MAX ST) CAPS Take 1 capsule by mouth daily.     Marland Kitchen ibuprofen (ADVIL,MOTRIN) 800 MG tablet Take 1 tablet (800 mg total) by mouth every 8 (eight) hours as needed. (Patient not taking: Reported on 04/18/2017) 30 tablet 0  . Magnesium 200 MG TABS Take 200 mg by mouth daily.     . Multiple Vitamins-Minerals (CENTRUM ADULTS PO) Take 1 tablet by mouth daily.     Marland Kitchen NIFEdipine (PROCARDIA XL/ADALAT-CC) 90 MG 24 hr tablet Take 90 mg by mouth daily.     Marland Kitchen OVER THE COUNTER MEDICATION Apply 1 application topically daily. MenoBalance Cream    . Polyethyl Glycol-Propyl Glycol (SYSTANE OP) Place 1 drop into both eyes daily.    . Probiotic Product (PROBIOTIC ADVANCED PO) Take 1 capsule  by mouth daily.     . tamoxifen (NOLVADEX) 20 MG tablet TAKE 1 TABLET(20 MG) BY MOUTH DAILY 90 tablet 1  . traMADol (ULTRAM) 50 MG tablet Take 50 mg by mouth 2 (two) times daily as needed for moderate pain.     Marland Kitchen zolpidem (AMBIEN) 5 MG tablet Take 5 mg by mouth at bedtime as needed for sleep.   0   No current facility-administered medications for this visit.     OBJECTIVE: Middle-aged white woman in no acute distress  Vitals:   04/21/17 1410  BP: (!) 161/94  Pulse: 98  Resp: 18  Temp: 97.6 F (36.4 C)  SpO2: 99%     Body mass index is 24.71 kg/m.   Wt Readings from Last 3 Encounters:  04/21/17 139 lb 8 oz (63.3 kg)  04/18/17 140 lb (63.5 kg)  04/07/17 133 lb (60.3 kg)      ECOG FS:0 - Asymptomatic   Sclerae unicteric, EOMs intact Oropharynx clear and moist No cervical or supraclavicular adenopathy Lungs no rales or rhonchi Heart regular rate and rhythm Abd soft, nontender, positive bowel sounds MSK no focal spinal tenderness, no upper extremity lymphedema Neuro: nonfocal, well oriented, appropriate affect Breasts: The right breast is benign.  The left breast is status post lumpectomy.  The incision is healing very nicely.  The cosmetic result is very good.  There is no swelling, erythema, or dehiscence.  Both axillae are benign.  LAB RESULTS:  CMP     Component Value Date/Time   NA 140 04/07/2017 0828   K 3.7 04/07/2017 0828   CL 102 04/07/2017 0828   CO2 26 04/07/2017 0828   GLUCOSE 131 (H) 04/07/2017 0828   BUN 19 04/07/2017 0828   CREATININE 1.14 (H) 04/07/2017 0828   CALCIUM 9.1 04/07/2017 0828   GFRNONAA 45 (L) 04/07/2017 0828   GFRAA 52 (L) 04/07/2017 0828    No results found for: TOTALPROTELP, ALBUMINELP, A1GS, A2GS, BETS, BETA2SER, GAMS, MSPIKE, SPEI  No results found for: KPAFRELGTCHN, LAMBDASER, KAPLAMBRATIO  Lab Results  Component Value Date   WBC 7.0 04/07/2017   HGB 13.3 04/07/2017   HCT 39.4 04/07/2017   MCV 97.5 04/07/2017   PLT 289  04/07/2017    '@LASTCHEMISTRY'$ @  No results found for: LABCA2  No components found for: QXIHWT888  No results for input(s): INR in the last 168 hours.  No  results found for: LABCA2  No results found for: BZJ696  No results found for: VEL381  No results found for: OFB510  No results found for: CA2729  No components found for: HGQUANT  No results found for: CEA1 / No results found for: CEA1   No results found for: AFPTUMOR  No results found for: CHROMOGRNA  No results found for: PSA1  No visits with results within 3 Day(s) from this visit.  Latest known visit with results is:  Admission on 04/07/2017, Discharged on 04/07/2017  Component Date Value Ref Range Status  . Sodium 04/07/2017 140  135 - 145 mmol/L Final  . Potassium 04/07/2017 3.7  3.5 - 5.1 mmol/L Final  . Chloride 04/07/2017 102  101 - 111 mmol/L Final  . CO2 04/07/2017 26  22 - 32 mmol/L Final  . Glucose, Bld 04/07/2017 131* 65 - 99 mg/dL Final  . BUN 04/07/2017 19  6 - 20 mg/dL Final  . Creatinine, Ser 04/07/2017 1.14* 0.44 - 1.00 mg/dL Final  . Calcium 04/07/2017 9.1  8.9 - 10.3 mg/dL Final  . GFR calc non Af Amer 04/07/2017 45* >60 mL/min Final  . GFR calc Af Amer 04/07/2017 52* >60 mL/min Final   Comment: (NOTE) The eGFR has been calculated using the CKD EPI equation. This calculation has not been validated in all clinical situations. eGFR's persistently <60 mL/min signify possible Chronic Kidney Disease.   Georgiann Hahn gap 04/07/2017 12  5 - 15 Final   Performed at [facility], [place] [address], [place], [place] [address]  . WBC 04/07/2017 7.0  4.0 - 10.5 K/uL Final  . RBC 04/07/2017 4.04  3.87 - 5.11 MIL/uL Final  . Hemoglobin 04/07/2017 13.3  12.0 - 15.0 g/dL Final  . HCT 04/07/2017 39.4  36.0 - 46.0 % Final  . MCV 04/07/2017 97.5  78.0 - 100.0 fL Final  . MCH 04/07/2017 32.9  26.0 - 34.0 pg Final  . MCHC 04/07/2017 33.8  30.0 - 36.0 g/dL Final  . RDW 04/07/2017 13.3  11.5 - 15.5 % Final  .  Platelets 04/07/2017 289  150 - 400 K/uL Final   Performed at [facility], [place] [address], [place], [place] [address]    (this displays the last labs from the last 3 days)  No results found for: TOTALPROTELP, ALBUMINELP, A1GS, A2GS, BETS, BETA2SER, GAMS, MSPIKE, SPEI (this displays SPEP labs)  No results found for: KPAFRELGTCHN, LAMBDASER, KAPLAMBRATIO (kappa/lambda light chains)  No results found for: HGBA, HGBA2QUANT, HGBFQUANT, HGBSQUAN (Hemoglobinopathy evaluation)   No results found for: LDH  No results found for: IRON, TIBC, IRONPCTSAT (Iron and TIBC)  No results found for: FERRITIN  Urinalysis No results found for: COLORURINE, APPEARANCEUR, LABSPEC, PHURINE, GLUCOSEU, HGBUR, BILIRUBINUR, KETONESUR, PROTEINUR, UROBILINOGEN, NITRITE, LEUKOCYTESUR   STUDIES: Nm Sentinel Node Inj-no Rpt (breast)  Result Date: 04/07/2017 Sulfur colloid was injected by the nuclear medicine technologist for melanoma sentinel node.   Mm Breast Surgical Specimen  Result Date: 04/07/2017 CLINICAL DATA:  Specimen radiograph status post left breast lumpectomy. EXAM: SPECIMEN RADIOGRAPH OF THE LEFT BREAST COMPARISON:  Previous exam(s). FINDINGS: Status post excision of the left breast. The radioactive seed and biopsy marker clip are present, completely intact, and were marked for pathology. These findings were communicated with the OR at 11:22 a.m. IMPRESSION: Specimen radiograph of the left breast. Electronically Signed   By: Ammie Ferrier M.D.   On: 04/07/2017 11:22   Mm Lt Radioactive Seed Loc Mammo Guide  Result Date: 04/04/2017 CLINICAL DATA:  Patient presents for radioactive seed localization a left breast carcinoma. EXAM: MAMMOGRAPHIC GUIDED RADIOACTIVE SEED LOCALIZATION OF THE LEFT BREAST COMPARISON:  Previous exam(s). FINDINGS: Patient presents for radioactive seed localization prior to surgical excision. I met with the patient and we discussed the procedure of seed localization  including benefits and alternatives. We discussed the high likelihood of a successful procedure. We discussed the risks of the procedure including infection, bleeding, tissue injury and further surgery. We discussed the low dose of radioactivity involved in the procedure. Informed, written consent was given. The usual time-out protocol was performed immediately prior to the procedure. Using mammographic guidance, sterile technique, 1% lidocaine and an I-125 radioactive seed, the ribbon shaped biopsy clip associated mass were localized using a lateral approach. The follow-up mammogram images confirm the seed in the expected location and were marked for Dr. Brantley Stage. Follow-up survey of the patient confirms the presence of the radioactive seed within the left breast. Order number of I-125 seed:  [id number]. Total activity:  0.236 reference Date: 03/27/2016 The patient tolerated the procedure well and was released from the [facility]. She was given instructions regarding seed removal. IMPRESSION: Radioactive seed localization of the left breast. No apparent complications. Electronically Signed   By: [name] M.D.   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 [place], [place] woman s/p central left breast biopsy 03/08/2017 for a clinical T1b N0, stage IA invasive ductal carcinoma, grade 1, estrogen and progesterone receptor positive, HER-2 not amplified, with an MIB-1 of 2%  (1)  Status post left breat lumpectomy with sentinel lymph node sampling 04/07/2017 showed a  pT1B pN0, stage Ia invasive ductal carcinoma, grade II, with negative margins  (a) total 1 lymph node removed from the axilla  (2) adjuvant radiation pending  (3) tamoxifen started 0125 2019  (a) bone density at the breast center 02/28/2017 showed a T score of -1.8  (b) status post hysterectomy  (c)  status post bilateral cataract surgery  (d) history of 5 years of hormone replacement with no clotting complications  PLAN: Natina did very well with her surgery.  We reviewed her pathology, which showed a T1b tumor.  In a patient who is almost 21, with ER positive node negative disease, I think it is best to forego Oncotype and proceed directly to radiation.  She is very comfortable with this decision.  She started tamoxifen at our last visit.  She is tolerating that remarkably well.  The plan will be to continue for a total of 5 years  She will start her radiation in about 10 days.  She will receive a total of 4 weeks.  She is going to return to see me in May.  At that time we will start long-term follow-up.  She knows to call for any other issues that may develop before the next visit.  Marqui Formby, [name], MD  04/21/17 2:47 PM Medical Oncology and Hematology [facility] [address] [place], [place] [address] Tel. [phone]    Fax. [phone]  This document serves as a record of services personally performed by Lurline Del, MD. It was created on his behalf by Sheron Nightingale, a trained medical scribe. The creation of this record is based on the scribe's personal observations and the provider's statements to them.   The  man

## 2017-04-21 ENCOUNTER — Inpatient Hospital Stay: Payer: Medicare Other | Attending: Oncology | Admitting: Oncology

## 2017-04-21 VITALS — BP 161/94 | HR 98 | Temp 97.6°F | Resp 18 | Ht 63.0 in | Wt 139.5 lb

## 2017-04-21 DIAGNOSIS — Z79810 Long term (current) use of selective estrogen receptor modulators (SERMs): Secondary | ICD-10-CM | POA: Insufficient documentation

## 2017-04-21 DIAGNOSIS — Z170 Estrogen receptor positive status [ER+]: Secondary | ICD-10-CM | POA: Diagnosis not present

## 2017-04-21 DIAGNOSIS — C50112 Malignant neoplasm of central portion of left female breast: Secondary | ICD-10-CM | POA: Diagnosis not present

## 2017-04-21 DIAGNOSIS — Z90710 Acquired absence of both cervix and uterus: Secondary | ICD-10-CM

## 2017-04-21 DIAGNOSIS — E7800 Pure hypercholesterolemia, unspecified: Secondary | ICD-10-CM | POA: Diagnosis not present

## 2017-04-21 DIAGNOSIS — Z85820 Personal history of malignant melanoma of skin: Secondary | ICD-10-CM | POA: Insufficient documentation

## 2017-04-21 DIAGNOSIS — I447 Left bundle-branch block, unspecified: Secondary | ICD-10-CM | POA: Insufficient documentation

## 2017-04-21 DIAGNOSIS — Z79899 Other long term (current) drug therapy: Secondary | ICD-10-CM | POA: Diagnosis not present

## 2017-04-21 DIAGNOSIS — Z87891 Personal history of nicotine dependence: Secondary | ICD-10-CM | POA: Diagnosis not present

## 2017-04-21 DIAGNOSIS — Z7982 Long term (current) use of aspirin: Secondary | ICD-10-CM | POA: Insufficient documentation

## 2017-04-21 DIAGNOSIS — I7300 Raynaud's syndrome without gangrene: Secondary | ICD-10-CM | POA: Insufficient documentation

## 2017-04-21 DIAGNOSIS — M069 Rheumatoid arthritis, unspecified: Secondary | ICD-10-CM | POA: Insufficient documentation

## 2017-04-24 ENCOUNTER — Telehealth: Payer: Self-pay | Admitting: Oncology

## 2017-04-24 NOTE — Telephone Encounter (Signed)
Per 3/1 no los °

## 2017-04-25 DIAGNOSIS — F411 Generalized anxiety disorder: Secondary | ICD-10-CM

## 2017-04-25 DIAGNOSIS — F329 Major depressive disorder, single episode, unspecified: Secondary | ICD-10-CM

## 2017-04-25 HISTORY — DX: Generalized anxiety disorder: F41.1

## 2017-04-25 HISTORY — DX: Major depressive disorder, single episode, unspecified: F32.9

## 2017-04-26 ENCOUNTER — Encounter (HOSPITAL_COMMUNITY): Payer: Self-pay

## 2017-05-02 ENCOUNTER — Ambulatory Visit
Admission: RE | Admit: 2017-05-02 | Discharge: 2017-05-02 | Disposition: A | Discharge status: Home / Self Care | Payer: Medicare Other | Source: Ambulatory Visit | Attending: Radiation Oncology | Admitting: Radiation Oncology

## 2017-05-02 DIAGNOSIS — Z79899 Other long term (current) drug therapy: Secondary | ICD-10-CM | POA: Insufficient documentation

## 2017-05-02 DIAGNOSIS — C50112 Malignant neoplasm of central portion of left female breast: Secondary | ICD-10-CM | POA: Insufficient documentation

## 2017-05-02 DIAGNOSIS — Z170 Estrogen receptor positive status [ER+]: Secondary | ICD-10-CM | POA: Diagnosis not present

## 2017-05-02 NOTE — Progress Notes (Signed)
  Radiation Oncology         [phone]) [phone] ________________________________  Name: Heidi Lutz MRN: [id number]  Date: 05/02/2017  DOB: 07-26-1940   DIAGNOSIS:     ICD-10-CM   1. Malignant neoplasm of central portion of left breast in female, estrogen receptor positive ([place]) C50.112    Z17.0     SIMULATION AND TREATMENT PLANNING NOTE  The patient presented for simulation prior to beginning her course of radiation treatment for her diagnosis of left-sided breast cancer. The patient was placed in a supine position on a breast board. A customized vac-lock bag was constructed and this complex treatment device will be used on a daily basis during her treatment. In this fashion, a CT scan was obtained through the chest area and an isocenter was placed near the chest wall within the breast.  The patient will be planned to receive a course of radiation initially to a dose of 42.5 Gy. This will consist of a whole breast radiotherapy technique. To accomplish this, 2 customized blocks have been designed which will correspond to medial and lateral whole breast tangent fields. This treatment will be accomplished at 2.5 Gy per fraction. A forward planning technique will also be evaluated to determine if this approach improves the plan. It is anticipated that the patient will then receive a 7.5 Gy boost to the seroma cavity which has been contoured. This will be accomplished at 2.5 Gy per fraction.   This initial treatment will consist of a 3-D conformal technique. The seroma has been contoured as the primary target structure. Additionally, dose volume histograms of both this target as well as the lungs and heart will also be evaluated. Such an approach is necessary to ensure that the target area is adequately covered while the nearby critical  normal structures are adequately spared.  Plan:  The final anticipated total dose therefore will correspond to 50 Gy.   Special treatment procedure was performed  today due to the extra time and effort required by myself to plan and prepare this patient for deep inspiration breath hold technique.  I have determined cardiac sparing to be of benefit to this patient to prevent long term cardiac damage due to radiation of the heart.  Bellows were placed on the patient's abdomen. To facilitate cardiac sparing, the patient was coached by the radiation therapists on breath hold techniques and breathing practice was performed. Practice waveforms were obtained. The patient was then scanned while maintaining breath hold in the treatment position.  This image was then transferred over to the imaging specialist. The imaging specialist then created a fusion of the free breathing and breath hold scans using the chest wall as the stable structure. I personally reviewed the fusion in axial, coronal and sagittal image planes.  Excellent cardiac sparing was obtained.  I felt the patient is an appropriate candidate for breath hold and the patient will be treated as such.  The image fusion was then reviewed with the patient to reinforce the necessity of reproducible breath hold.     _______________________________   Jodelle Gross, MD, PhD

## 2017-05-02 NOTE — Progress Notes (Signed)
  Radiation Oncology         [phone] ________________________________  Name: Heidi Lutz MRN: [id number]  Date: 05/02/2017  DOB: 10/10/1940  Optical Surface Tracking Plan:  Since intensity modulated radiotherapy (IMRT) and 3D conformal radiation treatment methods are predicated on accurate and precise positioning for treatment, intrafraction motion monitoring is medically necessary to ensure accurate and safe treatment delivery.  The ability to quantify intrafraction motion without excessive ionizing radiation dose can only be performed with optical surface tracking. Accordingly, surface imaging offers the opportunity to obtain 3D measurements of patient position throughout IMRT and 3D treatments without excessive radiation exposure.  I am ordering optical surface tracking for this patient's upcoming course of radiotherapy. ________________________________  Kyung Rudd, MD 05/02/2017 4:09 PM    Reference:   Ursula Alert, J, et al. Surface imaging-based analysis of intrafraction motion for breast radiotherapy patients.Journal of [place], n. 6, nov. 2014. ISSN [id number].   Available at: <http://www.jacmp.org/index.php/jacmp/article/view/4957>.

## 2017-05-03 DIAGNOSIS — C50112 Malignant neoplasm of central portion of left female breast: Secondary | ICD-10-CM | POA: Diagnosis not present

## 2017-05-09 ENCOUNTER — Ambulatory Visit
Admission: RE | Admit: 2017-05-09 | Discharge: 2017-05-09 | Disposition: A | Discharge status: Home / Self Care | Payer: Medicare Other | Source: Ambulatory Visit | Attending: Radiation Oncology | Admitting: Radiation Oncology

## 2017-05-09 DIAGNOSIS — C50112 Malignant neoplasm of central portion of left female breast: Secondary | ICD-10-CM | POA: Diagnosis not present

## 2017-05-10 ENCOUNTER — Ambulatory Visit
Admission: RE | Admit: 2017-05-10 | Discharge: 2017-05-10 | Disposition: A | Discharge status: Home / Self Care | Payer: Medicare Other | Source: Ambulatory Visit | Attending: Radiation Oncology | Admitting: Radiation Oncology

## 2017-05-10 DIAGNOSIS — C50112 Malignant neoplasm of central portion of left female breast: Secondary | ICD-10-CM | POA: Diagnosis not present

## 2017-05-11 ENCOUNTER — Ambulatory Visit
Admission: RE | Admit: 2017-05-11 | Discharge: 2017-05-11 | Disposition: A | Discharge status: Home / Self Care | Payer: Medicare Other | Source: Ambulatory Visit | Attending: Radiation Oncology | Admitting: Radiation Oncology

## 2017-05-11 DIAGNOSIS — C50112 Malignant neoplasm of central portion of left female breast: Secondary | ICD-10-CM | POA: Diagnosis not present

## 2017-05-12 ENCOUNTER — Ambulatory Visit
Admission: RE | Admit: 2017-05-12 | Discharge: 2017-05-12 | Disposition: A | Discharge status: Home / Self Care | Payer: Medicare Other | Source: Ambulatory Visit | Attending: Radiation Oncology | Admitting: Radiation Oncology

## 2017-05-12 DIAGNOSIS — C50112 Malignant neoplasm of central portion of left female breast: Secondary | ICD-10-CM | POA: Diagnosis not present

## 2017-05-15 ENCOUNTER — Ambulatory Visit
Admission: RE | Admit: 2017-05-15 | Discharge: 2017-05-15 | Disposition: A | Discharge status: Home / Self Care | Payer: Medicare Other | Source: Ambulatory Visit | Attending: Radiation Oncology | Admitting: Radiation Oncology

## 2017-05-15 DIAGNOSIS — C50112 Malignant neoplasm of central portion of left female breast: Secondary | ICD-10-CM | POA: Diagnosis not present

## 2017-05-16 ENCOUNTER — Ambulatory Visit
Admission: RE | Admit: 2017-05-16 | Discharge: 2017-05-16 | Disposition: A | Discharge status: Home / Self Care | Payer: Medicare Other | Source: Ambulatory Visit | Attending: Radiation Oncology | Admitting: Radiation Oncology

## 2017-05-16 DIAGNOSIS — C50112 Malignant neoplasm of central portion of left female breast: Secondary | ICD-10-CM | POA: Diagnosis not present

## 2017-05-17 ENCOUNTER — Ambulatory Visit
Admission: RE | Admit: 2017-05-17 | Discharge: 2017-05-17 | Disposition: A | Discharge status: Home / Self Care | Payer: Medicare Other | Source: Ambulatory Visit | Attending: Radiation Oncology | Admitting: Radiation Oncology

## 2017-05-17 DIAGNOSIS — C50112 Malignant neoplasm of central portion of left female breast: Secondary | ICD-10-CM | POA: Diagnosis not present

## 2017-05-18 ENCOUNTER — Ambulatory Visit
Admission: RE | Admit: 2017-05-18 | Discharge: 2017-05-18 | Disposition: A | Discharge status: Home / Self Care | Payer: Medicare Other | Source: Ambulatory Visit | Attending: Radiation Oncology | Admitting: Radiation Oncology

## 2017-05-18 DIAGNOSIS — C50112 Malignant neoplasm of central portion of left female breast: Secondary | ICD-10-CM | POA: Diagnosis not present

## 2017-05-19 ENCOUNTER — Ambulatory Visit
Admission: RE | Admit: 2017-05-19 | Discharge: 2017-05-19 | Disposition: A | Discharge status: Home / Self Care | Payer: Medicare Other | Source: Ambulatory Visit | Attending: Radiation Oncology | Admitting: Radiation Oncology

## 2017-05-19 DIAGNOSIS — C50112 Malignant neoplasm of central portion of left female breast: Secondary | ICD-10-CM | POA: Diagnosis not present

## 2017-05-22 ENCOUNTER — Ambulatory Visit
Admission: RE | Admit: 2017-05-22 | Discharge: 2017-05-22 | Disposition: A | Discharge status: Home / Self Care | Payer: Medicare Other | Source: Ambulatory Visit | Attending: Radiation Oncology | Admitting: Radiation Oncology

## 2017-05-22 DIAGNOSIS — Z510 Encounter for antineoplastic radiation therapy: Secondary | ICD-10-CM | POA: Insufficient documentation

## 2017-05-22 DIAGNOSIS — Z170 Estrogen receptor positive status [ER+]: Secondary | ICD-10-CM | POA: Diagnosis not present

## 2017-05-22 DIAGNOSIS — C50112 Malignant neoplasm of central portion of left female breast: Secondary | ICD-10-CM | POA: Insufficient documentation

## 2017-05-23 ENCOUNTER — Ambulatory Visit
Admission: RE | Admit: 2017-05-23 | Discharge: 2017-05-23 | Disposition: A | Discharge status: Home / Self Care | Payer: Medicare Other | Source: Ambulatory Visit | Attending: Radiation Oncology | Admitting: Radiation Oncology

## 2017-05-23 DIAGNOSIS — C50112 Malignant neoplasm of central portion of left female breast: Secondary | ICD-10-CM | POA: Diagnosis not present

## 2017-05-24 ENCOUNTER — Ambulatory Visit
Admission: RE | Admit: 2017-05-24 | Discharge: 2017-05-24 | Disposition: A | Discharge status: Home / Self Care | Payer: Medicare Other | Source: Ambulatory Visit | Attending: Radiation Oncology | Admitting: Radiation Oncology

## 2017-05-24 DIAGNOSIS — C50112 Malignant neoplasm of central portion of left female breast: Secondary | ICD-10-CM | POA: Diagnosis not present

## 2017-05-25 ENCOUNTER — Ambulatory Visit
Admission: RE | Admit: 2017-05-25 | Discharge: 2017-05-25 | Disposition: A | Discharge status: Home / Self Care | Payer: Medicare Other | Source: Ambulatory Visit | Attending: Radiation Oncology | Admitting: Radiation Oncology

## 2017-05-25 DIAGNOSIS — C50112 Malignant neoplasm of central portion of left female breast: Secondary | ICD-10-CM | POA: Diagnosis not present

## 2017-05-26 ENCOUNTER — Ambulatory Visit
Admission: RE | Admit: 2017-05-26 | Discharge: 2017-05-26 | Disposition: A | Discharge status: Home / Self Care | Payer: Medicare Other | Source: Ambulatory Visit | Attending: Radiation Oncology | Admitting: Radiation Oncology

## 2017-05-26 DIAGNOSIS — C50112 Malignant neoplasm of central portion of left female breast: Secondary | ICD-10-CM | POA: Diagnosis not present

## 2017-05-29 ENCOUNTER — Ambulatory Visit
Admission: RE | Admit: 2017-05-29 | Discharge: 2017-05-29 | Disposition: A | Discharge status: Home / Self Care | Payer: Medicare Other | Source: Ambulatory Visit | Attending: Radiation Oncology | Admitting: Radiation Oncology

## 2017-05-29 DIAGNOSIS — C50112 Malignant neoplasm of central portion of left female breast: Secondary | ICD-10-CM | POA: Diagnosis not present

## 2017-05-30 ENCOUNTER — Ambulatory Visit
Admission: RE | Admit: 2017-05-30 | Discharge: 2017-05-30 | Disposition: A | Discharge status: Home / Self Care | Payer: Medicare Other | Source: Ambulatory Visit | Attending: Radiation Oncology | Admitting: Radiation Oncology

## 2017-05-30 DIAGNOSIS — C50112 Malignant neoplasm of central portion of left female breast: Secondary | ICD-10-CM | POA: Diagnosis not present

## 2017-05-31 ENCOUNTER — Ambulatory Visit
Admission: RE | Admit: 2017-05-31 | Discharge: 2017-05-31 | Disposition: A | Discharge status: Home / Self Care | Payer: Medicare Other | Source: Ambulatory Visit | Attending: Radiation Oncology | Admitting: Radiation Oncology

## 2017-05-31 DIAGNOSIS — C50112 Malignant neoplasm of central portion of left female breast: Secondary | ICD-10-CM | POA: Diagnosis not present

## 2017-06-01 ENCOUNTER — Ambulatory Visit
Admission: RE | Admit: 2017-06-01 | Discharge: 2017-06-01 | Disposition: A | Discharge status: Home / Self Care | Payer: Medicare Other | Source: Ambulatory Visit | Attending: Radiation Oncology | Admitting: Radiation Oncology

## 2017-06-01 DIAGNOSIS — C50112 Malignant neoplasm of central portion of left female breast: Secondary | ICD-10-CM | POA: Diagnosis not present

## 2017-06-02 ENCOUNTER — Ambulatory Visit
Admission: RE | Admit: 2017-06-02 | Discharge: 2017-06-02 | Disposition: A | Discharge status: Home / Self Care | Payer: Medicare Other | Source: Ambulatory Visit | Attending: Radiation Oncology | Admitting: Radiation Oncology

## 2017-06-02 DIAGNOSIS — C50112 Malignant neoplasm of central portion of left female breast: Secondary | ICD-10-CM

## 2017-06-02 DIAGNOSIS — Z170 Estrogen receptor positive status [ER+]: Principal | ICD-10-CM

## 2017-06-02 MED ORDER — RADIAPLEXRX EX GEL
Freq: Two times a day (BID) | CUTANEOUS | Status: DC
  Administered 2017-06-02: 16:00:00 via TOPICAL

## 2017-06-05 ENCOUNTER — Ambulatory Visit
Admission: RE | Admit: 2017-06-05 | Discharge: 2017-06-05 | Disposition: A | Discharge status: Home / Self Care | Payer: Medicare Other | Source: Ambulatory Visit | Attending: Radiation Oncology | Admitting: Radiation Oncology

## 2017-06-05 DIAGNOSIS — C50112 Malignant neoplasm of central portion of left female breast: Secondary | ICD-10-CM | POA: Diagnosis not present

## 2017-06-06 ENCOUNTER — Ambulatory Visit
Admission: RE | Admit: 2017-06-06 | Discharge: 2017-06-06 | Disposition: A | Discharge status: Home / Self Care | Payer: Medicare Other | Source: Ambulatory Visit | Attending: Radiation Oncology | Admitting: Radiation Oncology

## 2017-06-06 DIAGNOSIS — C50112 Malignant neoplasm of central portion of left female breast: Secondary | ICD-10-CM | POA: Diagnosis not present

## 2017-06-13 ENCOUNTER — Encounter: Payer: Self-pay | Admitting: Radiation Oncology

## 2017-06-13 NOTE — Progress Notes (Signed)
  Radiation Oncology         [phone]) [phone] ________________________________  Name: Heidi Lutz MRN: [id number]  Date: 06/13/2017  DOB: 1940-02-23  End of Treatment Note  Diagnosis:   Malignant neoplasm of central portion of left breast in female, estrogen receptor positive ([place])     Indication for treatment:  Curative       Radiation treatment dates:   05/10/2017 - 06/06/2017  Site/dose:    Breast_Lt/ 42.5 Gy delivered in 17 fractions of 2.5 Gy Breast_Lt_Bst/ 7.5 Gy delivered in 3 fractions of 2.5 Gy  Beams/energy:    3D/ 10X, 6X 3D/ 10X, 6X  Narrative: The patient tolerated radiation treatment relatively well.   By the end of treatment her symptoms mildly worsened. She endorsed pain and fatigue. She experienced erythema to her left breast and axilla. She also noted blisters to her upper breast. She used cortisone cream and radiaplex gel for relief. The patient denied any other symptoms.   Plan: The patient has completed radiation treatment. The patient will return to radiation oncology clinic for routine followup in one month. I advised them to call or return sooner if they have any questions or concerns related to their recovery or treatment.  ------------------------------------------------  Jodelle Gross, MD, PhD  This document serves as a record of services personally performed by Kyung Rudd, MD. It was created on his behalf by Margit Banda, a trained medical scribe. The creation of this record is based on the scribe's personal observations and the provider's statements to them. This document has been checked and approved by the attending provider.

## 2017-06-14 ENCOUNTER — Other Ambulatory Visit: Payer: Self-pay | Admitting: Radiation Oncology

## 2017-06-14 ENCOUNTER — Encounter: Payer: Self-pay | Admitting: Radiation Oncology

## 2017-06-14 ENCOUNTER — Telehealth: Payer: Self-pay | Admitting: Radiation Oncology

## 2017-06-14 ENCOUNTER — Telehealth: Payer: Self-pay | Admitting: *Deleted

## 2017-06-14 MED ORDER — SULFAMETHOXAZOLE-TRIMETHOPRIM 400-80 MG PO TABS: 1.0000 | ORAL_TABLET | Freq: Two times a day (BID) | ORAL | 0 refills | Status: DC

## 2017-06-14 NOTE — Telephone Encounter (Signed)
Returned patient's phone call, lvm for a return call 

## 2017-06-14 NOTE — Progress Notes (Signed)
I was able to see the patient's picture of her skin by mychart, see below. She's had progressive redness and persistent blistering of her skin despite radioplex, neosporin, and hydrocortisone, and the skin is warm to touch, with some yellow blistered sites. No fevers are noted, no antibiotic allergies are noted. We discussed outlining the area with a ball point pen and starting bactrim BID since she has a PCN allergy. She will let me know how things are looking in the next few days, and I'd be happy to see her sooner if needed.

## 2017-06-14 NOTE — Telephone Encounter (Deleted)
-----   Message from Kerri Perches sent at 06/14/2017 12:25 PM EDT ----- Regarding: phone call Heather Roberts,    The above patient's husband Rush Landmark) would like for you to call him.  Mr. Hurtado phone number is [phone].  Thanks,  [employer]

## 2017-06-14 NOTE — Telephone Encounter (Signed)
The patient's husband called and let us know his wife is using radioplex and hydrocortisone/neosporin prn and is having persistent skin breakdown. He is going to try to send me a mychart picture of it, if not, I would be happy to see her in the clinic.

## 2017-06-26 ENCOUNTER — Inpatient Hospital Stay: Payer: Medicare Other | Attending: Oncology | Admitting: Oncology

## 2017-06-26 ENCOUNTER — Telehealth: Payer: Self-pay | Admitting: Oncology

## 2017-06-26 VITALS — BP 150/81 | HR 100 | Temp 98.5°F | Resp 18 | Ht 69.0 in | Wt 136.1 lb

## 2017-06-26 DIAGNOSIS — Z96653 Presence of artificial knee joint, bilateral: Secondary | ICD-10-CM | POA: Insufficient documentation

## 2017-06-26 DIAGNOSIS — Z170 Estrogen receptor positive status [ER+]: Secondary | ICD-10-CM | POA: Diagnosis not present

## 2017-06-26 DIAGNOSIS — E7800 Pure hypercholesterolemia, unspecified: Secondary | ICD-10-CM | POA: Insufficient documentation

## 2017-06-26 DIAGNOSIS — Z79899 Other long term (current) drug therapy: Secondary | ICD-10-CM | POA: Diagnosis not present

## 2017-06-26 DIAGNOSIS — Z885 Allergy status to narcotic agent status: Secondary | ICD-10-CM | POA: Insufficient documentation

## 2017-06-26 DIAGNOSIS — Z87891 Personal history of nicotine dependence: Secondary | ICD-10-CM | POA: Diagnosis not present

## 2017-06-26 DIAGNOSIS — Z7982 Long term (current) use of aspirin: Secondary | ICD-10-CM | POA: Insufficient documentation

## 2017-06-26 DIAGNOSIS — M069 Rheumatoid arthritis, unspecified: Secondary | ICD-10-CM | POA: Insufficient documentation

## 2017-06-26 DIAGNOSIS — I7300 Raynaud's syndrome without gangrene: Secondary | ICD-10-CM | POA: Diagnosis not present

## 2017-06-26 DIAGNOSIS — Z90710 Acquired absence of both cervix and uterus: Secondary | ICD-10-CM | POA: Diagnosis not present

## 2017-06-26 DIAGNOSIS — C50112 Malignant neoplasm of central portion of left female breast: Secondary | ICD-10-CM | POA: Insufficient documentation

## 2017-06-26 DIAGNOSIS — Z96612 Presence of left artificial shoulder joint: Secondary | ICD-10-CM | POA: Insufficient documentation

## 2017-06-26 DIAGNOSIS — Z880 Allergy status to penicillin: Secondary | ICD-10-CM | POA: Diagnosis not present

## 2017-06-26 NOTE — Progress Notes (Signed)
[facility]  Telephone:([phone]) [phone] Fax:(336) [phone]     ID: FALYNN AILEY DOB: 1940-06-08  MR#: [id number]  BCW#:[id number]  Patient Care Team: Katherina Mires, MD as PCP - General (Family Medicine) Magrinat, [name], MD as Consulting Physician (Oncology) Jovita Kussmaul, MD as Consulting Physician (General Surgery) OTHER MD:  CHIEF COMPLAINT: Estrogen receptor positive breast cancer  CURRENT TREATMENT: Tamoxifen   HISTORY OF CURRENT ILLNESS: From the original intake note:  Heidi Lutz had routine screening mammography on 02/27/2017 showing a possible asymmetry in the left breast. She underwent unilateral diagnostic mammography with tomography and left breast ultrasonography at [facility] on 03/03/2017 showing: breast density category C. Small left breast mass in the 3:30 o'clock lower outer position 5 cm from the nipple measuring 0.8 x 0.5 x 0.7 cm is suspicious for breast malignancy. The left axilla is negative for lymphadenopathy.   Accordingly on 03/08/2016 she proceeded to biopsy of the left breast mass in question. The pathology from this procedure showed ([id number]): invasive mammary carcinoma. 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 Prognostic indicators significant for: estrogen receptor, 100% positive and progesterone receptor, 70% positive, both with strong staining intensity. Proliferation marker Ki67 at 2%. HER2 not amplified with ratio HER2/CEP17 signals 1.39 and average HER2 copies per cell 1.95.  The patient's subsequent history is as detailed below.  INTERVAL HISTORY: Telicia returns today for follow up and treatment of her estrogen receptor positive breast cancer accompanied by her husband. Since her last visit, she completed radiation treatments on 06/06/2017. She took antibiotics for a week after her skin was burning. The skin is healing and is not painful for her anymore.   She continues on tamoxifen, with good  tolerance. She has some occasional hot flashes that are not too intense are do not occur daily. She denies issues with increased vaginal discharge.   REVIEW OF SYSTEMS: Marilee reports that she is still healing from radiation treatments. She would like to receive information on physical therapy to increase ROM in her left arm. She denies unusual headaches, visual changes, nausea, vomiting, or dizziness. There has been no unusual cough, phlegm production, or pleurisy. This been no change in bowel or bladder habits. She denies unexplained fatigue or unexplained weight loss, bleeding, rash, or fever. A detailed review of systems was otherwise stable.   PAST MEDICAL HISTORY: Past Medical History:  Diagnosis Date  . Cancer ([place]) 01/2017   left breast cancer, basal cell and 1 melanoma  . Complication of anesthesia    heart rate spikeed in the PACU on her 2nd knee surgery  . Environmental allergies   . High cholesterol   . Osteoarthritis   . PONV (postoperative nausea and vomiting)    with 1st knee surgery had n/v, none since  . Raynaud disease    Raynauds disease-takes procardia  . Rheumatoid arthritis ([place])     PAST SURGICAL HISTORY: Past Surgical History:  Procedure Laterality Date  . ABDOMINAL HYSTERECTOMY  1972  . APPENDECTOMY  1953  . BREAST LUMPECTOMY WITH RADIOACTIVE SEED AND SENTINEL LYMPH NODE BIOPSY Left 04/07/2017   Procedure: BREAST LUMPECTOMY WITH RADIOACTIVE SEED AND SENTINEL LYMPH NODE BIOPSY;  Surgeon: Erroll Luna, MD;  Location: [facility];  Service: General;  Laterality: Left;  . BUNIONECTOMY  1999  . COLONOSCOPY    . EYE SURGERY     bil cataract  . GANGLION CYST EXCISION  1968  . JOINT REPLACEMENT Left 2017    reverse shoulder replacement  .  REPLACEMENT TOTAL KNEE  2012, 2013   Right and Left  . SHOULDER ARTHROSCOPY Left   . SYMPATHECTOMY  1970  . TONSILLECTOMY  1949    FAMILY HISTORY Family History  Adopted: Yes  Problem Relation Age of Onset  . Breast cancer  Neg Hx   The patient is adopted and has no information regarding her biologic family.  GYNECOLOGIC HISTORY:  No LMP recorded. Patient has had a hysterectomy. Menarche: X years old Age at first live birth: 77 years old [place] P 2 LMP status post hysterectomy at age 77 Contraceptiven/a HRT yes, 5 years, including progesterone  SO?  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  Daughter [name] is a Scientist, research (physical sciences) in [employer].  Daughter [name] is an Location manager in [place].  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 Smoking status: Former Smoker    Last attempt to quit: 02/22/1963    Years since quitting: 54.3  . Smokeless tobacco: Never Used  Substance Use Topics  . Alcohol use: Yes    Alcohol/week: 8.4 oz    Types: 14 Glasses of wine per week    Comment: 2 glasses of wine daily.   . Drug use: No     Colonoscopy: 2014  PAP: Status post hysterectomy  Bone density: 02/27/2017 showed a T scored of -1.8 osteopenia    Allergies  Allergen Reactions  . Fluorouracil Hives  . Codeine Nausea And Vomiting  . Hydrocodone Nausea And Vomiting  . Penicillins Swelling, Rash and Other (See Comments)    Pt states allergic to all "cillin" drugs. Joint pain. Has patient had a PCN reaction causing immediate rash, facial/tongue/throat swelling, SOB or lightheadedness with hypotension: Yes Has patient had a PCN reaction causing severe rash involving mucus membranes or skin necrosis: No Has patient had a PCN reaction that required hospitalization: No Has patient had a PCN reaction occurring within the last 10 years: No If all of the above answers are "NO", then may proceed with Cephalosporin use.     Current Outpatient Medications  Medication Sig Dispense Refill  . acetaminophen  (TYLENOL) 500 MG tablet Take 1,000 mg by mouth every 6 (six) hours as needed for moderate pain or headache.     Marland Kitchen aspirin EC 81 MG tablet Take 81 mg by mouth daily.    Marland Kitchen atorvastatin (LIPITOR) 20 MG tablet Take 20 mg by mouth daily.    . Biotin (SUPER BIOTIN) 5 MG TABS Take 5 mg by mouth daily.     . Black Cohosh 540 MG CAPS Take 540 mg by mouth daily.     . calcium-vitamin D (OSCAL WITH D) 500-200 MG-UNIT TABS tablet Take 1 tablet by mouth daily.     . cetirizine (ZYRTEC) 10 MG tablet Take 10 mg by mouth daily.    . Cholecalciferol (VITAMIN D) 2000 units CAPS Take 2,000 Units by mouth daily.     Marland Kitchen co-enzyme Q-10 50 MG capsule Take 50 mg by mouth daily.     Marland Kitchen CRANBERRY PO Take 1 capsule by mouth daily.     Marland Kitchen dicyclomine (BENTYL) 10 MG capsule Take 10 mg by mouth twice daily  1  . Glucosamine-Chondroit-Vit C-Mn (GLUCOSAMINE-CHONDROITIN MAX ST) CAPS Take 1 capsule by mouth daily.     Marland Kitchen  ibuprofen (ADVIL,MOTRIN) 800 MG tablet Take 1 tablet (800 mg total) by mouth every 8 (eight) hours as needed. (Patient not taking: Reported on 04/18/2017) 30 tablet 0  . Magnesium 200 MG TABS Take 200 mg by mouth daily.     . Multiple Vitamins-Minerals (CENTRUM ADULTS PO) Take 1 tablet by mouth daily.     Marland Kitchen NIFEdipine (PROCARDIA XL/ADALAT-CC) 90 MG 24 hr tablet Take 90 mg by mouth daily.     Marland Kitchen OVER THE COUNTER MEDICATION Apply 1 application topically daily. MenoBalance Cream    . Polyethyl Glycol-Propyl Glycol (SYSTANE OP) Place 1 drop into both eyes daily.    . Probiotic Product (PROBIOTIC ADVANCED PO) Take 1 capsule by mouth daily.     Marland Kitchen sulfamethoxazole-trimethoprim (BACTRIM) 400-80 MG tablet Take 1 tablet by mouth 2 (two) times daily. 14 tablet 0  . tamoxifen (NOLVADEX) 20 MG tablet TAKE 1 TABLET(20 MG) BY MOUTH DAILY 90 tablet 1  . traMADol (ULTRAM) 50 MG tablet Take 50 mg by mouth 2 (two) times daily as needed for moderate pain.     Marland Kitchen zolpidem (AMBIEN) 5 MG tablet Take 5 mg by mouth at bedtime as needed for  sleep.   0   No current facility-administered medications for this visit.     OBJECTIVE: Middle-aged white woman who appears stated age  77:   06/26/17 1206  BP: (!) 150/81  Pulse: 100  Resp: 18  Temp: 98.5 F (36.9 C)  SpO2: 97%     Body mass index is 20.1 kg/m.   Wt Readings from Last 3 Encounters:  06/26/17 136 lb 1.6 oz (61.7 kg)  04/21/17 139 lb 8 oz (63.3 kg)  04/18/17 140 lb (63.5 kg)      ECOG FS:1 - Symptomatic but completely ambulatory   Sclerae unicteric No cervical or supraclavicular adenopathy Lungs no rales or rhonchi Heart regular rate and rhythm Abd soft, nontender, positive bowel sounds MSK kyphosis but no focal spinal tenderness, no upper extremity lymphedema Neuro: nonfocal, well oriented, appropriate affect Breasts: The right breast is unremarkable.  The left breast is status post lumpectomy and radiation.  There is some residual erythema but no residual desquamation.  There is no evidence of local recurrence.  Both axillae are benign.  LAB RESULTS:  CMP     Component Value Date/Time   NA 140 04/07/2017 0828   K 3.7 04/07/2017 0828   CL 102 04/07/2017 0828   CO2 26 04/07/2017 0828   GLUCOSE 131 (H) 04/07/2017 0828   BUN 19 04/07/2017 0828   CREATININE 1.14 (H) 04/07/2017 0828   CALCIUM 9.1 04/07/2017 0828   GFRNONAA 45 (L) 04/07/2017 0828   GFRAA 52 (L) 04/07/2017 0828    No results found for: TOTALPROTELP, ALBUMINELP, A1GS, A2GS, BETS, BETA2SER, GAMS, MSPIKE, SPEI  No results found for: KPAFRELGTCHN, LAMBDASER, KAPLAMBRATIO  Lab Results  Component Value Date   WBC 7.0 04/07/2017   HGB 13.3 04/07/2017   HCT 39.4 04/07/2017   MCV 97.5 04/07/2017   PLT 289 04/07/2017    '@LASTCHEMISTRY'$ @  No results found for: LABCA2  No components found for: QASTMH962  No results for input(s): INR in the last 168 hours.  No results found for: LABCA2  No results found for: IWL798  No results found for: XQJ194  No results found for:  RDE081  No results found for: CA2729  No components found for: HGQUANT  No results found for: CEA1 / No results found for: CEA1   No results found for:  AFPTUMOR  No results found for: CHROMOGRNA  No results found for: PSA1  No visits with results within 3 Day(s) from this visit.  Latest known visit with results is:  Admission on 04/07/2017, Discharged on 04/07/2017  Component Date Value Ref Range Status  . Sodium 04/07/2017 140  135 - 145 mmol/L Final  . Potassium 04/07/2017 3.7  3.5 - 5.1 mmol/L Final  . Chloride 04/07/2017 102  101 - 111 mmol/L Final  . CO2 04/07/2017 26  22 - 32 mmol/L Final  . Glucose, Bld 04/07/2017 131* 65 - 99 mg/dL Final  . BUN 04/07/2017 19  6 - 20 mg/dL Final  . Creatinine, Ser 04/07/2017 1.14* 0.44 - 1.00 mg/dL Final  . Calcium 04/07/2017 9.1  8.9 - 10.3 mg/dL Final  . GFR calc non Af Amer 04/07/2017 45* >60 mL/min Final  . GFR calc Af Amer 04/07/2017 52* >60 mL/min Final   Comment: (NOTE) The eGFR has been calculated using the CKD EPI equation. This calculation has not been validated in all clinical situations. eGFR's persistently <60 mL/min signify possible Chronic Kidney Disease.   Georgiann Hahn gap 04/07/2017 12  5 - 15 Final   Performed at [facility], [place] [address], [place], [place] [address]  . WBC 04/07/2017 7.0  4.0 - 10.5 K/uL Final  . RBC 04/07/2017 4.04  3.87 - 5.11 MIL/uL Final  . Hemoglobin 04/07/2017 13.3  12.0 - 15.0 g/dL Final  . HCT 04/07/2017 39.4  36.0 - 46.0 % Final  . MCV 04/07/2017 97.5  78.0 - 100.0 fL Final  . MCH 04/07/2017 32.9  26.0 - 34.0 pg Final  . MCHC 04/07/2017 33.8  30.0 - 36.0 g/dL Final  . RDW 04/07/2017 13.3  11.5 - 15.5 % Final  . Platelets 04/07/2017 289  150 - 400 K/uL Final   Performed at [facility], [place] [address], [place], [place] [address]    (this displays the last labs from the last 3 days)  No results found for: TOTALPROTELP, ALBUMINELP, A1GS, A2GS, BETS, BETA2SER, GAMS, MSPIKE,  SPEI (this displays SPEP labs)  No results found for: KPAFRELGTCHN, LAMBDASER, KAPLAMBRATIO (kappa/lambda light chains)  No results found for: HGBA, HGBA2QUANT, HGBFQUANT, HGBSQUAN (Hemoglobinopathy evaluation)   No results found for: LDH  No results found for: IRON, TIBC, IRONPCTSAT (Iron and TIBC)  No results found for: FERRITIN  Urinalysis No results found for: COLORURINE, APPEARANCEUR, LABSPEC, PHURINE, GLUCOSEU, HGBUR, BILIRUBINUR, KETONESUR, PROTEINUR, UROBILINOGEN, NITRITE, LEUKOCYTESUR   STUDIES: No results found.  ELIGIBLE FOR AVAILABLE RESEARCH PROTOCOL: no  ASSESSMENT: 77 y.o. [place], [place] woman s/p central left breast biopsy 03/08/2017 for a clinical T1b N0, stage IA invasive ductal carcinoma, grade 1, estrogen and progesterone receptor positive, HER-2 not amplified, with an MIB-1 of 2%  (1)  Status post left breast lumpectomy with sentinel lymph node sampling 04/07/2017 showed a  pT1B pN0, stage IA invasive ductal carcinoma, grade II, with negative margins  (a) total 1 lymph node removed from the axilla  (2) adjuvant radiation 05/10/2017 - 06/06/2017 Site/dose: Breast_Lt/ 42.5 Gy delivered in 17 fractions of 2.5 Gy Breast_Lt_Bst/ 7.5 Gy delivered in 3 fractions of 2.5 Gy  (3) tamoxifen started 03/17/2017  (a) bone density at the breast center 02/28/2017 showed a T score of -1.8  (b) status post hysterectomy  (c) status post bilateral cataract surgery  (d) history of 5 years of hormone replacement with no clotting complications  PLAN: Kinza has recovered nicely from her radiation treatments.  I think she would benefit from some physical therapy to the right upper extremity and I wrote a prescription so she can obtain that at [facility] [place] where she lives.  She is tolerating the tamoxifen well.  The plan will be to continue that for 5 years.  I gave her the name of the pain physician who is working with neurosurgery in case she needs additional treatment for her  chronic rheumatoid arthritis related back pain  If she sees her surgeon Dr. Brantley Stage in 3 months then she will see me in 6 months.  Otherwise she will see me 3 months from now.  Otherwise she knows to call for any issues that may develop before the next visit.     Magrinat, [name], MD  06/26/17 12:24 PM Medical Oncology and Hematology [facility] [address] [place], [place] [address] Tel. [phone]    Fax. [phone]  This document serves as a record of services personally performed by Lurline Del, MD. It was created on his behalf by Sheron Nightingale, a trained medical scribe. The creation of this record is based on the scribe's personal observations and the provider's statements to them.   I have reviewed the above documentation for accuracy and completeness, and I agree with the above.

## 2017-06-26 NOTE — Telephone Encounter (Signed)
Gave avs and calendar ° °

## 2017-07-06 ENCOUNTER — Encounter: Payer: Self-pay | Admitting: Radiation Oncology

## 2017-07-06 ENCOUNTER — Ambulatory Visit
Admission: RE | Admit: 2017-07-06 | Discharge: 2017-07-06 | Disposition: A | Discharge status: Home / Self Care | Payer: Medicare Other | Source: Ambulatory Visit | Attending: Radiation Oncology | Admitting: Radiation Oncology

## 2017-07-06 ENCOUNTER — Other Ambulatory Visit: Payer: Self-pay

## 2017-07-06 VITALS — BP 154/97 | HR 84 | Temp 97.9°F | Resp 20 | Ht 63.0 in | Wt 138.9 lb

## 2017-07-06 DIAGNOSIS — Z79899 Other long term (current) drug therapy: Secondary | ICD-10-CM | POA: Diagnosis not present

## 2017-07-06 DIAGNOSIS — Z170 Estrogen receptor positive status [ER+]: Secondary | ICD-10-CM | POA: Insufficient documentation

## 2017-07-06 DIAGNOSIS — Z7982 Long term (current) use of aspirin: Secondary | ICD-10-CM | POA: Insufficient documentation

## 2017-07-06 DIAGNOSIS — D0512 Intraductal carcinoma in situ of left breast: Secondary | ICD-10-CM | POA: Insufficient documentation

## 2017-07-06 DIAGNOSIS — C50112 Malignant neoplasm of central portion of left female breast: Secondary | ICD-10-CM

## 2017-07-06 NOTE — Progress Notes (Signed)
Radiation Oncology         [phone] ________________________________  Name: Heidi Lutz MRN: [id number]  Date of Service: 07/06/2017  DOB: 07-Feb-1941  Post Treatment Note  CC: Katherina Mires, MD  Magrinat, [name], MD  Diagnosis:    Stage IA, pT1bN0M0 grade 2, ER/PR positive invasive ductal carcinoma of the left breast.  Interval Since Last Radiation:  4 weeks    05/10/2017 - 06/06/2017: Breast_Lt/ 42.5 Gy delivered in 17 fractions of 2.5 Gy Breast_Lt_Bst/ 7.5 Gy delivered in 3 fractions of 2.5 Gy   Narrative:  The patient returns today for routine follow-up. During treatment she did very well with radiotherapy.  She did have significant dry desquamation, and had what appeared to be posttreatment cellulitic changes, she was started on antibiotic therapy by phone and and following up with her, was doing much better.                   On review of systems, the patient states she's doing well since completing treatment and antibiotics. No skin concerns are noted currently.  ALLERGIES:  is allergic to fluorouracil; codeine; hydrocodone; and penicillins.  Meds: Current Outpatient Medications  Medication Sig Dispense Refill  . acetaminophen (TYLENOL) 500 MG tablet Take 1,000 mg by mouth every 6 (six) hours as needed for moderate pain or headache.     Marland Kitchen aspirin EC 81 MG tablet Take 81 mg by mouth daily.    Marland Kitchen atorvastatin (LIPITOR) 20 MG tablet Take 20 mg by mouth daily.    . Biotin (SUPER BIOTIN) 5 MG TABS Take 5 mg by mouth daily.     . Black Cohosh 540 MG CAPS Take 540 mg by mouth daily.     . calcium-vitamin D (OSCAL WITH D) 500-200 MG-UNIT TABS tablet Take 1 tablet by mouth daily.     . cetirizine (ZYRTEC) 10 MG tablet Take 10 mg by mouth daily.    . Cholecalciferol (VITAMIN D) 2000 units CAPS Take 2,000 Units by mouth daily.     Marland Kitchen co-enzyme Q-10 50 MG capsule Take 50 mg by mouth daily.     Marland Kitchen CRANBERRY PO Take 1 capsule by mouth daily.     . Glucosamine-Chondroit-Vit C-Mn  (GLUCOSAMINE-CHONDROITIN MAX ST) CAPS Take 1 capsule by mouth daily.     . Magnesium 200 MG TABS Take 200 mg by mouth daily.     . Multiple Vitamins-Minerals (CENTRUM ADULTS PO) Take 1 tablet by mouth daily.     Marland Kitchen NIFEdipine (PROCARDIA XL/ADALAT-CC) 90 MG 24 hr tablet Take 90 mg by mouth daily.     Vladimir Faster Glycol-Propyl Glycol (SYSTANE OP) Place 1 drop into both eyes daily.    . Probiotic Product (PROBIOTIC ADVANCED PO) Take 1 capsule by mouth daily.     . tamoxifen (NOLVADEX) 20 MG tablet TAKE 1 TABLET(20 MG) BY MOUTH DAILY 90 tablet 1  . traMADol (ULTRAM) 50 MG tablet Take 50 mg by mouth 2 (two) times daily as needed for moderate pain.     Marland Kitchen dicyclomine (BENTYL) 10 MG capsule Take 10 mg by mouth twice daily  1  . zolpidem (AMBIEN) 5 MG tablet Take 5 mg by mouth at bedtime as needed for sleep.   0   No current facility-administered medications for this encounter.     Physical Findings:  height is 5\' 3"  (1.6 m) and weight is 138 lb 14.4 oz (63 kg). Her oral temperature is 97.9 F (36.6 C). Her blood pressure is  154/97 (abnormal) and her pulse is 84. Her respiration is 20 and oxygen saturation is 100%.  Pain Assessment Pain Score: 0-No pain In general this is a well appearing caucasian female in no acute distress. She's alert and oriented x4 and appropriate throughout the examination. Cardiopulmonary assessment is negative for acute distress and she exhibits normal effort. The left breast was examined and reveals erythema without warmth or blistering.   Lab Findings: Lab Results  Component Value Date   WBC 7.0 04/07/2017   HGB 13.3 04/07/2017   HCT 39.4 04/07/2017   MCV 97.5 04/07/2017   PLT 289 04/07/2017     Radiographic Findings: No results found.  Impression/Plan: 1.  Stage IA, pT1bN0M0 grade 2, ER/PR positive invasive ductal carcinoma of the left breast. The patient has been doing well since completion of radiotherapy. We discussed that we would be happy to continue to  follow her as needed, but she will also continue to follow up with Dr. Jana Hakim in medical oncology. She was counseled on skin care as well as measures to avoid sun exposure to this area.  2. Survivorship. We discussed the importance of survivorship evaluation and she is not currently scheduled for this, but will be in the near future. She was also given the monthly calendar for access to resources offered within the cancer center.     Carola Rhine, PAC

## 2017-08-02 ENCOUNTER — Other Ambulatory Visit: Payer: Self-pay | Admitting: Oncology

## 2017-08-18 ENCOUNTER — Telehealth: Payer: Self-pay | Admitting: Oncology

## 2017-08-18 NOTE — Telephone Encounter (Signed)
Patient called to cancel °

## 2017-09-25 ENCOUNTER — Other Ambulatory Visit: Payer: Self-pay

## 2017-09-25 DIAGNOSIS — Z170 Estrogen receptor positive status [ER+]: Principal | ICD-10-CM

## 2017-09-25 DIAGNOSIS — C50112 Malignant neoplasm of central portion of left female breast: Secondary | ICD-10-CM

## 2017-09-25 NOTE — Progress Notes (Signed)
[place]  [phone] [phone]     ID: Heidi Lutz DOB: 07-Feb-1941  MR#: [id number]  PJK#:[id number]  Patient Care Team: Katherina Mires, MD as PCP - General (Family Medicine) [name], [name], MD as Consulting Physician (Oncology) Erroll Luna, MD as Consulting Physician (General Surgery) OTHER MD:  CHIEF COMPLAINT: Estrogen receptor positive breast cancer  CURRENT TREATMENT: Tamoxifen   HISTORY OF CURRENT ILLNESS: From the original intake note:  Heidi Lutz had routine screening mammography on 02/27/2017 showing a possible asymmetry in the left breast. She underwent unilateral diagnostic mammography with tomography and left breast ultrasonography at [facility] on 03/03/2017 showing: breast density category C. Small left breast mass in the 3:30 o'clock lower outer position 5 cm from the nipple measuring 0.8 x 0.5 x 0.7 cm is suspicious for breast malignancy. The left axilla is negative for lymphadenopathy.   Accordingly on 03/08/2016 she proceeded to biopsy of the left breast mass in question. The pathology from this procedure showed ([id number]): invasive mammary carcinoma. 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 Prognostic indicators significant for: estrogen receptor, 100% positive and progesterone receptor, 70% positive, both with strong staining intensity. Proliferation marker Ki67 at 2%. HER2 not amplified with ratio HER2/CEP17 signals 1.39 and average HER2 copies per cell 1.95.  The patient's subsequent history is as detailed below.  INTERVAL HISTORY: [name] returns today for follow up and treatment of her estrogen receptor positive breast cancer accompanied by her husband and daughter. She continues on tamoxifen, with good tolerance. She has occasional hot flashes, and they do not wake her at night. She also takes black cohosh for her hot flashes, which seem the help. She denies issues with increase vaginal  discharge.    REVIEW OF SYSTEMS: [name]. She takes tramadol for her back pain and she receives cortisone shots every 3 months with  Dr. Delsa Bern. She denies unusual headaches, visual changes, nausea, vomiting, or dizziness. There has been no unusual cough, phlegm production, or pleurisy. This been no change in bowel or bladder habits. She denies unexplained fatigue or unexplained weight loss, bleeding, rash, or fever. A detailed review of systems was otherwise stable.    PAST MEDICAL HISTORY: Past Medical History:  Diagnosis Date  . Cancer ([place]) 01/2017   left breast cancer, basal cell and 1 melanoma  . Complication of anesthesia    heart rate spikeed in the PACU on her 2nd knee surgery  . Environmental allergies   . High cholesterol   . Osteoarthritis   . PONV (postoperative nausea and vomiting)    with 1st knee surgery had n/v, none since  . Raynaud disease    Raynauds disease-takes procardia  . Rheumatoid arthritis ([place])     PAST SURGICAL HISTORY: Past Surgical History:  Procedure Laterality Date  . ABDOMINAL HYSTERECTOMY  1972  . APPENDECTOMY  1953  . BREAST LUMPECTOMY WITH RADIOACTIVE SEED AND SENTINEL LYMPH NODE BIOPSY Left 04/07/2017   Procedure: BREAST LUMPECTOMY WITH RADIOACTIVE SEED AND SENTINEL LYMPH NODE BIOPSY;  Surgeon: Erroll Luna, MD;  Location: [facility];  Service: General;  Laterality: Left;  . BUNIONECTOMY  1999  . COLONOSCOPY    . EYE SURGERY     bil cataract  . GANGLION CYST EXCISION  1968  . JOINT REPLACEMENT Left 2017    reverse shoulder replacement  . REPLACEMENT TOTAL KNEE  2012,  2013   Right and Left  . SHOULDER ARTHROSCOPY Left   . SYMPATHECTOMY  1970  . TONSILLECTOMY  1949    FAMILY HISTORY Family History  Adopted: Yes  Problem Relation Age of Onset  . Breast cancer Neg Hx   The patient is adopted and has no information regarding her  biologic family.  GYNECOLOGIC HISTORY:  No LMP recorded. Patient has had a hysterectomy. Menarche: X years old Age at first live birth: 77 years old [place] P 2 LMP status post hysterectomy at age 77 Contraceptiven/a HRT yes, 5 years, including progesterone  SO?  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  Daughter [name] is a Scientist, research (physical sciences) in [employer].  Daughter [name] is an Location manager in [place].  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 Smoking status: Former Smoker    Last attempt to quit: 02/22/1963    Years since quitting: 54.6  . Smokeless tobacco: Never Used  Substance Use Topics  . Alcohol use: Yes    Alcohol/week: 8.4 oz    Types: 14 Glasses of wine per week    Comment: 2 glasses of wine daily.   . Drug use: No     Colonoscopy: 2014  PAP: Status post hysterectomy  Bone density: 02/27/2017 showed a T scored of -1.8 osteopenia    Allergies  Allergen Reactions  . Fluorouracil Hives  . Codeine Nausea And Vomiting  . Hydrocodone Nausea And Vomiting  . Penicillins Swelling, Rash and Other (See Comments)    Pt states allergic to all "cillin" drugs. Joint pain. Has patient had a PCN reaction causing immediate rash, facial/tongue/throat swelling, SOB or lightheadedness with hypotension: Yes Has patient had a PCN reaction causing severe rash involving mucus membranes or skin necrosis: No Has patient had a PCN reaction that required hospitalization: No Has patient had a PCN reaction occurring within the last 10 years: No If all of the above answers are "NO", then may proceed with Cephalosporin use.     Current Outpatient Medications  Medication Sig Dispense Refill  . acetaminophen (TYLENOL) 500 MG tablet Take 1,000 mg by mouth every 6 (six) hours as  needed for moderate pain or headache.     Marland Kitchen aspirin EC 81 MG tablet Take 81 mg by mouth daily.    Marland Kitchen atorvastatin (LIPITOR) 20 MG tablet Take 20 mg by mouth daily.    . Biotin (SUPER BIOTIN) 5 MG TABS Take 5 mg by mouth daily.     . Black Cohosh 540 MG CAPS Take 540 mg by mouth daily.     . calcium-vitamin D (OSCAL WITH D) 500-200 MG-UNIT TABS tablet Take 1 tablet by mouth daily.     . cetirizine (ZYRTEC) 10 MG tablet Take 10 mg by mouth daily.    . Cholecalciferol (VITAMIN D) 2000 units CAPS Take 2,000 Units by mouth daily.     Marland Kitchen co-enzyme Q-10 50 MG capsule Take 50 mg by mouth daily.     Marland Kitchen CRANBERRY PO Take 1 capsule by mouth daily.     Marland Kitchen dicyclomine (BENTYL) 10 MG capsule Take 10 mg by mouth twice daily  1  . Glucosamine-Chondroit-Vit C-Mn (GLUCOSAMINE-CHONDROITIN MAX ST) CAPS Take 1 capsule by mouth daily.     . Magnesium 200  MG TABS Take 200 mg by mouth daily.     . Multiple Vitamins-Minerals (CENTRUM ADULTS PO) Take 1 tablet by mouth daily.     Marland Kitchen NIFEdipine (PROCARDIA XL/ADALAT-CC) 90 MG 24 hr tablet Take 90 mg by mouth daily.     Vladimir Faster Glycol-Propyl Glycol (SYSTANE OP) Place 1 drop into both eyes daily.    . Probiotic Product (PROBIOTIC ADVANCED PO) Take 1 capsule by mouth daily.     . tamoxifen (NOLVADEX) 20 MG tablet TAKE 1 TABLET(20 MG) BY MOUTH DAILY 90 tablet 0  . traMADol (ULTRAM) 50 MG tablet Take 50 mg by mouth 2 (two) times daily as needed for moderate pain.     Marland Kitchen zolpidem (AMBIEN) 5 MG tablet Take 5 mg by mouth at bedtime as needed for sleep.   0   No current facility-administered medications for this visit.     OBJECTIVE: Older white woman in no acute distress  Vitals:   09/26/17 1150  BP: (!) 148/80  Pulse: 99  Resp: 18  Temp: (!) 97.5 F (36.4 C)  SpO2: 95%     Body mass index is 24.55 kg/m.   Wt Readings from Last 3 Encounters:  09/26/17 138 lb 9.6 oz (62.9 kg)  07/06/17 138 lb 14.4 oz (63 kg)  06/26/17 136 lb 1.6 oz (61.7 kg)      ECOG FS:1 -  Symptomatic but completely ambulatory   Sclerae unicteric, pupils round and equal Oropharynx clear and moist No cervical or supraclavicular adenopathy Lungs no rales or rhonchi Heart regular rate and rhythm Abd soft, nontender, positive bowel sounds MSK significant joint changes secondary to rheumatoid arthritis, unchanged; no upper extremity lymphedema Neuro: nonfocal, well oriented, appropriate affect Breasts: The right breast is benign.  The left breast has undergone lumpectomy followed by radiation with no evidence of local recurrence.  Both axillae are benign.  LAB RESULTS:  CMP     Component Value Date/Time   NA 140 04/07/2017 0828   K 3.7 04/07/2017 0828   CL 102 04/07/2017 0828   CO2 26 04/07/2017 0828   GLUCOSE 131 (H) 04/07/2017 0828   BUN 19 04/07/2017 0828   CREATININE 1.14 (H) 04/07/2017 0828   CALCIUM 9.1 04/07/2017 0828   GFRNONAA 45 (L) 04/07/2017 0828   GFRAA 52 (L) 04/07/2017 0828    No results found for: TOTALPROTELP, ALBUMINELP, A1GS, A2GS, BETS, BETA2SER, GAMS, MSPIKE, SPEI  No results found for: KPAFRELGTCHN, LAMBDASER, KAPLAMBRATIO  Lab Results  Component Value Date   WBC 6.1 09/26/2017   NEUTROABS 3.6 09/26/2017   HGB 13.0 09/26/2017   HCT 38.3 09/26/2017   MCV 102.0 (H) 09/26/2017   PLT 231 09/26/2017    '@LASTCHEMISTRY'$ @  No results found for: LABCA2  No components found for: [id number]  No results for input(s): INR in the last 168 hours.  No results found for: LABCA2  No results found for: YTK160  No results found for: FUX323  No results found for: FTD322  No results found for: CA2729  No components found for: HGQUANT  No results found for: CEA1 / No results found for: CEA1   No results found for: AFPTUMOR  No results found for: CHROMOGRNA  No results found for: PSA1  Appointment on 09/26/2017  Component Date Value Ref Range Status  . WBC Count 09/26/2017 6.1  3.9 - 10.3 K/uL Final  . RBC 09/26/2017 3.75  3.70 - 5.45  MIL/uL Final  . Hemoglobin 09/26/2017 13.0  11.6 - 15.9 g/dL Final  .  HCT 09/26/2017 38.3  34.8 - 46.6 % Final  . MCV 09/26/2017 102.0* 79.5 - 101.0 fL Final  . MCH 09/26/2017 34.7* 25.1 - 34.0 pg Final  . MCHC 09/26/2017 34.0  31.5 - 36.0 g/dL Final  . RDW 09/26/2017 13.2  11.2 - 14.5 % Final  . Platelet Count 09/26/2017 231  145 - 400 K/uL Final  . Neutrophils Relative % 09/26/2017 58  % Final  . Neutro Abs 09/26/2017 3.6  1.5 - 6.5 K/uL Final  . Lymphocytes Relative 09/26/2017 26  % Final  . Lymphs Abs 09/26/2017 1.6  0.9 - 3.3 K/uL Final  . Monocytes Relative 09/26/2017 11  % Final  . Monocytes Absolute 09/26/2017 0.7  0.1 - 0.9 K/uL Final  . Eosinophils Relative 09/26/2017 3  % Final  . Eosinophils Absolute 09/26/2017 0.2  0.0 - 0.5 K/uL Final  . Basophils Relative 09/26/2017 2  % Final  . Basophils Absolute 09/26/2017 0.1  0.0 - 0.1 K/uL Final   Performed at [facility], [place] [address] [place], [place] [address]    (this displays the last labs from the last 3 days)  No results found for: TOTALPROTELP, ALBUMINELP, A1GS, A2GS, BETS, BETA2SER, GAMS, MSPIKE, SPEI (this displays SPEP labs)  No results found for: KPAFRELGTCHN, LAMBDASER, KAPLAMBRATIO (kappa/lambda light chains)  No results found for: HGBA, HGBA2QUANT, HGBFQUANT, HGBSQUAN (Hemoglobinopathy evaluation)   No results found for: LDH  No results found for: IRON, TIBC, IRONPCTSAT (Iron and TIBC)  No results found for: FERRITIN  Urinalysis No results found for: COLORURINE, APPEARANCEUR, LABSPEC, PHURINE, GLUCOSEU, HGBUR, BILIRUBINUR, KETONESUR, PROTEINUR, UROBILINOGEN, NITRITE, LEUKOCYTESUR   STUDIES: No results found.  ELIGIBLE FOR AVAILABLE RESEARCH PROTOCOL: no  ASSESSMENT: 77 y.o. [place], [place] woman s/p central left breast biopsy 03/08/2017 for a clinical T1b N0, stage IA invasive ductal carcinoma, grade 1, estrogen and progesterone receptor positive, HER-2 not amplified, with  an MIB-1 of 2%  (1)  Status post left breast lumpectomy with sentinel lymph node sampling 04/07/2017 showed a  pT1B pN0, stage IA invasive ductal carcinoma, grade II, with negative margins  (a) total 1 lymph node removed from the axilla  (2) adjuvant radiation 05/10/2017 - 06/06/2017 Site/dose: Breast_Lt/ 42.5 Gy delivered in 17 fractions of 2.5 Gy Breast_Lt_Bst/ 7.5 Gy delivered in 3 fractions of 2.5 Gy  (3) tamoxifen started 03/17/2017  (a) bone density at the breast center 02/28/2017 showed a T score of -1.8  (b) status post hysterectomy  (c) status post bilateral cataract surgery  (d) history of 5 years of hormone replacement with no clotting complications  (4) consider genetics testing and this adopted patient  PLAN: Flora is tolerating tamoxifen remarkably well.  The plan will be to continue that a minimum of 5 years.  She was wondering why not continue it to 10 years and beyond.  We discussed the evidence we have for the additional benefits of the second 5 years and the fact that we have no data regarding what happens after the first 10 years.  That does not mean that she cannot continue it indefinitely only that we do not know what we are doing when we do that  She will have her next mammogram in January and I have put that order in.  She will see me again in February and if all is well we will start seeing her on a once a year basis at that time.  I did not appreciate the fact that she was adopted.  She has no knowledge of family.  Accordingly she may qualify for genetics testing and I have put a query regarding that to 1 of our counselors.  She knows to call for any other issues that may develop before the next visit.    Shaelee Forni, [name], MD  09/26/17 12:04 PM Medical Oncology and Hematology [facility] [address] [place], [place] [address] Tel. [phone]    Fax. [phone]  Alice Rieger, am acting as scribe for Chauncey Cruel MD.  I,  Lurline Del MD, have reviewed the above documentation for accuracy and completeness, and I agree with the above.

## 2017-09-26 ENCOUNTER — Telehealth: Payer: Self-pay | Admitting: Oncology

## 2017-09-26 ENCOUNTER — Inpatient Hospital Stay: Payer: Medicare Other | Attending: Oncology | Admitting: Oncology

## 2017-09-26 ENCOUNTER — Inpatient Hospital Stay: Payer: Medicare Other

## 2017-09-26 VITALS — BP 148/80 | HR 99 | Temp 97.5°F | Resp 18 | Ht 63.0 in | Wt 138.6 lb

## 2017-09-26 DIAGNOSIS — Z923 Personal history of irradiation: Secondary | ICD-10-CM | POA: Diagnosis not present

## 2017-09-26 DIAGNOSIS — C50112 Malignant neoplasm of central portion of left female breast: Secondary | ICD-10-CM | POA: Insufficient documentation

## 2017-09-26 DIAGNOSIS — Z90710 Acquired absence of both cervix and uterus: Secondary | ICD-10-CM | POA: Diagnosis not present

## 2017-09-26 DIAGNOSIS — Z170 Estrogen receptor positive status [ER+]: Principal | ICD-10-CM

## 2017-09-26 DIAGNOSIS — Z79810 Long term (current) use of selective estrogen receptor modulators (SERMs): Secondary | ICD-10-CM | POA: Diagnosis not present

## 2017-09-26 LAB — CMP (CANCER CENTER ONLY)
ALT: 9 U/L (ref 0–44)
AST: 19 U/L (ref 15–41)
Albumin: 3.8 g/dL (ref 3.5–5.0)
Alkaline Phosphatase: 54 U/L (ref 38–126)
Anion gap: 12 (ref 5–15)
BILIRUBIN TOTAL: 0.6 mg/dL (ref 0.3–1.2)
BUN: 20 mg/dL (ref 8–23)
CHLORIDE: 104 mmol/L (ref 98–111)
CO2: 24 mmol/L (ref 22–32)
Calcium: 8.8 mg/dL — ABNORMAL LOW (ref 8.9–10.3)
Creatinine: 1.14 mg/dL — ABNORMAL HIGH (ref 0.44–1.00)
GFR, EST NON AFRICAN AMERICAN: 45 mL/min — AB (ref >60)
GFR, Est AFR Am: 52 mL/min — ABNORMAL LOW (ref >60)
Glucose, Bld: 100 mg/dL — ABNORMAL HIGH (ref 70–99)
Potassium: 3.7 mmol/L (ref 3.5–5.1)
Sodium: 140 mmol/L (ref 135–145)
TOTAL PROTEIN: 7.2 g/dL (ref 6.5–8.1)

## 2017-09-26 LAB — CBC WITH DIFFERENTIAL (CANCER CENTER ONLY)
BASOS ABS: 0.1 10*3/uL (ref 0.0–0.1)
Basophils Relative: 2 %
Eosinophils Absolute: 0.2 10*3/uL (ref 0.0–0.5)
Eosinophils Relative: 3 %
HEMATOCRIT: 38.3 % (ref 34.8–46.6)
HEMOGLOBIN: 13 g/dL (ref 11.6–15.9)
LYMPHS PCT: 26 %
Lymphs Abs: 1.6 10*3/uL (ref 0.9–3.3)
MCH: 34.7 pg — ABNORMAL HIGH (ref 25.1–34.0)
MCHC: 34 g/dL (ref 31.5–36.0)
MCV: 102 fL — AB (ref 79.5–101.0)
Monocytes Absolute: 0.7 10*3/uL (ref 0.1–0.9)
Monocytes Relative: 11 %
NEUTROS ABS: 3.6 10*3/uL (ref 1.5–6.5)
NEUTROS PCT: 58 %
Platelet Count: 231 10*3/uL (ref 145–400)
RBC: 3.75 MIL/uL (ref 3.70–5.45)
RDW: 13.2 % (ref 11.2–14.5)
WBC: 6.1 10*3/uL (ref 3.9–10.3)

## 2017-10-20 ENCOUNTER — Other Ambulatory Visit: Payer: Self-pay | Admitting: *Deleted

## 2017-10-20 MED ORDER — TAMOXIFEN CITRATE 20 MG PO TABS: ORAL_TABLET | ORAL | 0 refills | Status: DC

## 2017-12-13 ENCOUNTER — Other Ambulatory Visit: Payer: Self-pay | Admitting: Oncology

## 2018-01-16 ENCOUNTER — Encounter: Payer: Medicare Other | Admitting: Adult Health

## 2018-03-01 ENCOUNTER — Ambulatory Visit
Admission: RE | Admit: 2018-03-01 | Discharge: 2018-03-01 | Disposition: A | Discharge status: Home / Self Care | Payer: Medicare Other | Source: Ambulatory Visit | Attending: Oncology | Admitting: Oncology

## 2018-03-01 DIAGNOSIS — Z170 Estrogen receptor positive status [ER+]: Principal | ICD-10-CM

## 2018-03-01 DIAGNOSIS — C50112 Malignant neoplasm of central portion of left female breast: Secondary | ICD-10-CM

## 2018-03-01 HISTORY — DX: Personal history of irradiation: Z92.3

## 2018-04-06 ENCOUNTER — Other Ambulatory Visit: Payer: Self-pay

## 2018-04-06 DIAGNOSIS — Z170 Estrogen receptor positive status [ER+]: Principal | ICD-10-CM

## 2018-04-06 DIAGNOSIS — C50112 Malignant neoplasm of central portion of left female breast: Secondary | ICD-10-CM

## 2018-04-08 NOTE — Progress Notes (Signed)
[place]  Telephone:([phone]) [phone] Fax:(336) [phone]   ID: Anupama Piehl DOB: 1941/02/16  MR#: [id number]  NUU#:[id number]  Patient Care Team: [name], MD as PCP - General (Family Medicine) Magrinat, [name], MD as Consulting Physician (Oncology) Erroll Luna, MD as Consulting Physician (General Surgery) OTHER MD:   CHIEF COMPLAINT: Estrogen receptor positive breast cancer  CURRENT TREATMENT: Tamoxifen   HISTORY OF CURRENT ILLNESS: From the original intake note:  [name] had routine screening mammography on 02/27/2017 showing a possible asymmetry in the left breast. She underwent unilateral diagnostic mammography with tomography and left breast ultrasonography at [facility] on 03/03/2017 showing: breast density category C. Small left breast mass in the 3:30 o'clock lower outer position 5 cm from the nipple measuring 0.8 x 0.5 x 0.7 cm is suspicious for breast malignancy. The left axilla is negative for lymphadenopathy.   Accordingly on 03/08/2016 she proceeded to biopsy of the left breast mass in question. The pathology from this procedure showed ([id number]): invasive mammary carcinoma. 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 Prognostic indicators significant for: estrogen receptor, 100% positive and progesterone receptor, 70% positive, both with strong staining intensity. Proliferation marker Ki67 at 2%. HER2 not amplified with ratio HER2/CEP17 signals 1.39 and average HER2 copies per cell 1.95.  The patient's subsequent history is as detailed below.   INTERVAL HISTORY: Heidi Lutz returns today for follow-up and treatment of her estrogen receptor positive breast cancer. She is accompanied by her husband will them.  She continues on tamoxifen. She tolerates this well and without any noticeable side effects.    Since her last visit here, she underwent a digital diagnostic bilateral mammogram with tomography on  03/01/2018 showing Breast Density Category C. There is no mammographic evidence for malignancy.    REVIEW OF SYSTEMS: [name] has began to work out recently. She noticed that she developed a pain right below her scar about 4 weeks ago. She said it hurts to sit, lay down, and even breath at some times. She notes that when she presses into the spot that is causing her pain, the pain disappears. She is taking tramadol to treat, without constipation. The patient denies unusual headaches, visual changes, nausea, vomiting, or dizziness. There has been no unusual cough, phlegm production, or pleurisy. This been no change in bowel or bladder habits. The patient denies unexplained fatigue or unexplained weight loss, bleeding, rash, or fever. A detailed review of systems was otherwise noncontributory.    PAST MEDICAL HISTORY: Past Medical History:  Diagnosis Date  . Cancer ([place]) 01/2017   left breast cancer, basal cell and 1 melanoma  . Complication of anesthesia    heart rate spikeed in the PACU on her 2nd knee surgery  . Environmental allergies   . High cholesterol   . Osteoarthritis   . Personal history of radiation therapy   . PONV (postoperative nausea and vomiting)    with 1st knee surgery had n/v, none since  . Raynaud disease    Raynauds disease-takes procardia  . Rheumatoid arthritis ([place])     PAST SURGICAL HISTORY: Past Surgical History:  Procedure Laterality Date  . ABDOMINAL HYSTERECTOMY  1972  . APPENDECTOMY  1953  . BREAST LUMPECTOMY    . BREAST LUMPECTOMY WITH RADIOACTIVE SEED AND SENTINEL LYMPH NODE BIOPSY Left 04/07/2017   Procedure: BREAST LUMPECTOMY WITH RADIOACTIVE SEED AND SENTINEL LYMPH NODE BIOPSY;  Surgeon: Erroll Luna, MD;  Location: [facility];  Service: General;  Laterality: Left;  .  BUNIONECTOMY  1999  . COLONOSCOPY    . EYE SURGERY     bil cataract  . GANGLION CYST EXCISION  1968  . JOINT REPLACEMENT Left 2017    reverse shoulder replacement  . REPLACEMENT TOTAL  KNEE  2012, 2013   Right and Left  . SHOULDER ARTHROSCOPY Left   . SYMPATHECTOMY  1970  . TONSILLECTOMY  1949    FAMILY HISTORY: Family History  Adopted: Yes  Problem Relation Age of Onset  . Breast cancer Neg Hx    The patient is adopted and has no information regarding her biologic family.   GYNECOLOGIC HISTORY:  No LMP recorded. Patient has had a hysterectomy. Menarche: X years old Age at first live birth: 78 years old [place] P 2 LMP status post hysterectomy at age 14 Contraceptiven/a HRT yes, 5 years, including progesterone  SO?  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  Daughter [name] is a Scientist, research (physical sciences) in [employer].  Daughter [name] is an Location manager in [place].  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 Smoking status: Former Smoker    Last attempt to quit: 02/22/1963    Years since quitting: 55.1  . Smokeless tobacco: Never Used  Substance Use Topics  . Alcohol use: Yes    Alcohol/week: 14.0 standard drinks    Types: 14 Glasses of wine per week    Comment: 2 glasses of wine daily.   . Drug use: No    Colonoscopy: 2014  PAP: Status post hysterectomy  Bone density: 02/27/2017 showed a T scored of -1.8 osteopenia    Allergies  Allergen Reactions  . Fluorouracil Hives  . Codeine Nausea And Vomiting  . Hydrocodone Nausea And Vomiting  . Penicillins Swelling, Rash and Other (See Comments)    Pt states allergic to all "cillin" drugs. Joint pain. Has patient had a PCN reaction causing immediate rash, facial/tongue/throat swelling, SOB or lightheadedness with hypotension: Yes Has patient had a PCN reaction causing severe rash involving mucus membranes or skin necrosis: No Has patient had a PCN reaction that required hospitalization: No Has patient  had a PCN reaction occurring within the last 10 years: No If all of the above answers are "NO", then may proceed with Cephalosporin use.     Current Outpatient Medications  Medication Sig Dispense Refill  . acetaminophen (TYLENOL) 500 MG tablet Take 1,000 mg by mouth every 6 (six) hours as needed for moderate pain or headache.     Marland Kitchen aspirin EC 81 MG tablet Take 81 mg by mouth daily.    Marland Kitchen atorvastatin (LIPITOR) 20 MG tablet Take 20 mg by mouth daily.    . Biotin (SUPER BIOTIN) 5 MG TABS Take 5 mg by mouth daily.     . Black Cohosh 540 MG CAPS Take 540 mg by mouth daily.     . calcium-vitamin D (OSCAL WITH D) 500-200 MG-UNIT TABS tablet Take 1 tablet by mouth daily.     . cetirizine (ZYRTEC) 10 MG tablet Take 10 mg by mouth daily.    . Cholecalciferol (VITAMIN D) 2000 units CAPS Take 2,000 Units by mouth daily.     Marland Kitchen co-enzyme Q-10 50 MG capsule Take 50 mg by mouth daily.     Marland Kitchen CRANBERRY PO Take  1 capsule by mouth daily.     Marland Kitchen dicyclomine (BENTYL) 10 MG capsule Take 10 mg by mouth twice daily  1  . Glucosamine-Chondroit-Vit C-Mn (GLUCOSAMINE-CHONDROITIN MAX ST) CAPS Take 1 capsule by mouth daily.     . Magnesium 200 MG TABS Take 200 mg by mouth daily.     . Multiple Vitamins-Minerals (CENTRUM ADULTS PO) Take 1 tablet by mouth daily.     Marland Kitchen NIFEdipine (PROCARDIA XL/ADALAT-CC) 90 MG 24 hr tablet Take 90 mg by mouth daily.     Vladimir Faster Glycol-Propyl Glycol (SYSTANE OP) Place 1 drop into both eyes daily.    . predniSONE (DELTASONE) 5 MG tablet Take 6 tablets with food on day 1, 5 tablets with food on day 2, and so on until completing the 21 tablets 21 tablet 0  . Probiotic Product (PROBIOTIC ADVANCED PO) Take 1 capsule by mouth daily.     . tamoxifen (NOLVADEX) 20 MG tablet TAKE 1 TABLET(20 MG) BY MOUTH DAILY 90 tablet 0  . traMADol (ULTRAM) 50 MG tablet Take 50 mg by mouth 2 (two) times daily as needed for moderate pain.     Marland Kitchen zolpidem (AMBIEN) 5 MG tablet Take 5 mg by mouth at bedtime as  needed for sleep.   0   No current facility-administered medications for this visit.     OBJECTIVE: Older white woman who appears stated age  89:   04/10/18 1134  BP: 130/83  Resp: 18  Temp: (!) 91 F (32.8 C)  SpO2: 96%     Body mass index is 24.75 kg/m.   Wt Readings from Last 3 Encounters:  04/10/18 139 lb 11.2 oz (63.4 kg)  09/26/17 138 lb 9.6 oz (62.9 kg)  07/06/17 138 lb 14.4 oz (63 kg)      ECOG FS:1 - Symptomatic but completely ambulatory   Sclerae unicteric, pupils round and equal Oropharynx clear and moist No cervical or supraclavicular adenopathy Lungs no rales or rhonchi Heart regular rate and rhythm Abd soft, nontender, positive bowel sounds MSK no focal spinal tenderness but she has pain on pressure on the sternum as well as fairly focal pain in the left rib cage area under the breast Neuro: nonfocal, well oriented, appropriate affect Breasts: The right breast is unremarkable.  The left breast is status post lumpectomy and radiation.  There is some irregularity in the inferior aspect of the breast contour due to scar tissue, which is unchanged from baseline.  There is no evidence of disease recurrence.  Both axillae are benign.     LAB RESULTS:  CMP     Component Value Date/Time   NA 140 09/26/2017 1140   K 3.7 09/26/2017 1140   CL 104 09/26/2017 1140   CO2 24 09/26/2017 1140   GLUCOSE 100 (H) 09/26/2017 1140   BUN 20 09/26/2017 1140   CREATININE 1.14 (H) 09/26/2017 1140   CALCIUM 8.8 (L) 09/26/2017 1140   PROT 7.2 09/26/2017 1140   ALBUMIN 3.8 09/26/2017 1140   AST 19 09/26/2017 1140   ALT 9 09/26/2017 1140   ALKPHOS 54 09/26/2017 1140   BILITOT 0.6 09/26/2017 1140   GFRNONAA 45 (L) 09/26/2017 1140   GFRAA 52 (L) 09/26/2017 1140    No results found for: TOTALPROTELP, ALBUMINELP, A1GS, A2GS, BETS, BETA2SER, GAMS, MSPIKE, SPEI  No results found for: KPAFRELGTCHN, LAMBDASER, KAPLAMBRATIO  Lab Results  Component Value Date   WBC 5.9  04/10/2018   NEUTROABS 3.0 04/10/2018   HGB 13.1 04/10/2018  HCT 39.3 04/10/2018   MCV 99.0 04/10/2018   PLT 257 04/10/2018    '@LASTCHEMISTRY'$ @  No results found for: LABCA2  No components found for: [id number]  No results for input(s): INR in the last 168 hours.  No results found for: LABCA2  No results found for: YSA630  No results found for: ZSW109  No results found for: NAT557  No results found for: CA2729  No components found for: HGQUANT  No results found for: CEA1 / No results found for: CEA1   No results found for: AFPTUMOR  No results found for: CHROMOGRNA  No results found for: PSA1  Appointment on 04/10/2018  Component Date Value Ref Range Status  . WBC Count 04/10/2018 5.9  4.0 - 10.5 K/uL Final  . RBC 04/10/2018 3.97  3.87 - 5.11 MIL/uL Final  . Hemoglobin 04/10/2018 13.1  12.0 - 15.0 g/dL Final  . HCT 04/10/2018 39.3  36.0 - 46.0 % Final  . MCV 04/10/2018 99.0  80.0 - 100.0 fL Final  . MCH 04/10/2018 33.0  26.0 - 34.0 pg Final  . MCHC 04/10/2018 33.3  30.0 - 36.0 g/dL Final  . RDW 04/10/2018 12.9  11.5 - 15.5 % Final  . Platelet Count 04/10/2018 257  150 - 400 K/uL Final  . nRBC 04/10/2018 0.0  0.0 - 0.2 % Final  . Neutrophils Relative % 04/10/2018 51  % Final  . Neutro Abs 04/10/2018 3.0  1.7 - 7.7 K/uL Final  . Lymphocytes Relative 04/10/2018 27  % Final  . Lymphs Abs 04/10/2018 1.6  0.7 - 4.0 K/uL Final  . Monocytes Relative 04/10/2018 15  % Final  . Monocytes Absolute 04/10/2018 0.9  0.1 - 1.0 K/uL Final  . Eosinophils Relative 04/10/2018 5  % Final  . Eosinophils Absolute 04/10/2018 0.3  0.0 - 0.5 K/uL Final  . Basophils Relative 04/10/2018 1  % Final  . Basophils Absolute 04/10/2018 0.1  0.0 - 0.1 K/uL Final  . Immature Granulocytes 04/10/2018 1  % Final  . Abs Immature Granulocytes 04/10/2018 0.03  0.00 - 0.07 K/uL Final   Performed at [facility], [place] [address], [place], [place] [address]    (this displays  the last labs from the last 3 days)  No results found for: TOTALPROTELP, ALBUMINELP, A1GS, A2GS, BETS, BETA2SER, GAMS, MSPIKE, SPEI (this displays SPEP labs)  No results found for: KPAFRELGTCHN, LAMBDASER, KAPLAMBRATIO (kappa/lambda light chains)  No results found for: HGBA, HGBA2QUANT, HGBFQUANT, HGBSQUAN (Hemoglobinopathy evaluation)   No results found for: LDH  No results found for: IRON, TIBC, IRONPCTSAT (Iron and TIBC)  No results found for: FERRITIN  Urinalysis No results found for: COLORURINE, APPEARANCEUR, LABSPEC, PHURINE, GLUCOSEU, HGBUR, BILIRUBINUR, KETONESUR, PROTEINUR, UROBILINOGEN, NITRITE, LEUKOCYTESUR   STUDIES: No results found.   ELIGIBLE FOR AVAILABLE RESEARCH PROTOCOL: no   ASSESSMENT: 78 y.o. [place], [place] woman s/p central left breast biopsy 03/08/2017 for a clinical T1b N0, stage IA invasive ductal carcinoma, grade 1, estrogen and progesterone receptor positive, HER-2 not amplified, with an MIB-1 of 2%  (1)  Status post left breast lumpectomy with sentinel lymph node sampling 04/07/2017 showed a  pT1B pN0, stage IA invasive ductal carcinoma, grade II, with negative margins  (a) total 1 lymph node removed from the axilla  (2) adjuvant radiation 05/10/2017 - 06/06/2017 Site/dose: Breast_Lt/ 42.5 Gy delivered in 17 fractions of 2.5 Gy Breast_Lt_Bst/ 7.5 Gy delivered in 3 fractions of 2.5 Gy  (3) tamoxifen started 03/17/2017  (a) bone density  at the breast center 02/28/2017 showed a T score of -1.8  (b) status post hysterectomy  (c) status post bilateral cataract surgery  (d) history of 5 years of hormone replacement with no clotting complications  (4) consider genetics testing and this adopted patient   PLAN: Heidi Lutz is a year out from definitive surgery for her breast cancer with no evidence of disease recurrence.  This is favorable.  She is tolerating tamoxifen well and the plan will be to continue that a total of 5 years.  The pain she is  having in her rib cage particularly on the left side I think is going to be musculoskeletal and related to her recent exercise activities.  However she certainly could be having bone metastases, although that would be unlikely given the size and phenotype of her tumor.  Nevertheless we are going to show that this is not the case by obtaining a CT scan of the chest, which I expect to be benign.  In the meantime she will start a Medrol Dosepak.  I suspect within 24 hours of starting that her pain will be considerably improved.  If it is not she is going to take Aleve plus Tylenol as instructed today on an as-needed basis.  She will return to see me in 6 months.  She knows to call for any other issue that may develop before the next visit.   Magrinat, [name], MD  04/10/18 12:02 PM Medical Oncology and Hematology [facility] [address] [place], [place] [address] Tel. [phone]    Fax. [phone]  I, Jacqualyn Posey am acting as a Education administrator for Chauncey Cruel, MD.   I, Lurline Del MD, have reviewed the above documentation for accuracy and completeness, and I agree with the above.

## 2018-04-10 ENCOUNTER — Telehealth: Payer: Self-pay | Admitting: Oncology

## 2018-04-10 ENCOUNTER — Inpatient Hospital Stay: Payer: Medicare Other | Attending: Oncology

## 2018-04-10 ENCOUNTER — Inpatient Hospital Stay (HOSPITAL_BASED_OUTPATIENT_CLINIC_OR_DEPARTMENT_OTHER): Payer: Medicare Other | Admitting: Oncology

## 2018-04-10 VITALS — BP 130/83 | Temp 91.0°F | Resp 18 | Wt 139.7 lb

## 2018-04-10 DIAGNOSIS — Z923 Personal history of irradiation: Secondary | ICD-10-CM | POA: Insufficient documentation

## 2018-04-10 DIAGNOSIS — Z170 Estrogen receptor positive status [ER+]: Secondary | ICD-10-CM | POA: Diagnosis not present

## 2018-04-10 DIAGNOSIS — R0781 Pleurodynia: Secondary | ICD-10-CM

## 2018-04-10 DIAGNOSIS — Z79810 Long term (current) use of selective estrogen receptor modulators (SERMs): Secondary | ICD-10-CM

## 2018-04-10 DIAGNOSIS — C50112 Malignant neoplasm of central portion of left female breast: Secondary | ICD-10-CM | POA: Diagnosis not present

## 2018-04-10 HISTORY — DX: Pleurodynia: R07.81

## 2018-04-10 LAB — CBC WITH DIFFERENTIAL (CANCER CENTER ONLY)
Abs Immature Granulocytes: 0.03 K/uL (ref 0.00–0.07)
Basophils Absolute: 0.1 K/uL (ref 0.0–0.1)
Basophils Relative: 1 %
Eosinophils Absolute: 0.3 K/uL (ref 0.0–0.5)
Eosinophils Relative: 5 %
HCT: 39.3 % (ref 36.0–46.0)
Hemoglobin: 13.1 g/dL (ref 12.0–15.0)
Immature Granulocytes: 1 %
Lymphocytes Relative: 27 %
Lymphs Abs: 1.6 K/uL (ref 0.7–4.0)
MCH: 33 pg (ref 26.0–34.0)
MCHC: 33.3 g/dL (ref 30.0–36.0)
MCV: 99 fL (ref 80.0–100.0)
Monocytes Absolute: 0.9 K/uL (ref 0.1–1.0)
Monocytes Relative: 15 %
Neutro Abs: 3 K/uL (ref 1.7–7.7)
Neutrophils Relative %: 51 %
Platelet Count: 257 K/uL (ref 150–400)
RBC: 3.97 MIL/uL (ref 3.87–5.11)
RDW: 12.9 % (ref 11.5–15.5)
WBC Count: 5.9 K/uL (ref 4.0–10.5)
nRBC: 0 % (ref 0.0–0.2)

## 2018-04-10 LAB — CMP (CANCER CENTER ONLY)
ALT: <6 U/L (ref 0–44)
AST: 19 U/L (ref 15–41)
Albumin: 3.9 g/dL (ref 3.5–5.0)
Alkaline Phosphatase: 53 U/L (ref 38–126)
Anion gap: 10 (ref 5–15)
BUN: 20 mg/dL (ref 8–23)
CO2: 25 mmol/L (ref 22–32)
Calcium: 8.9 mg/dL (ref 8.9–10.3)
Chloride: 105 mmol/L (ref 98–111)
Creatinine: 1.11 mg/dL — ABNORMAL HIGH (ref 0.44–1.00)
GFR, Est AFR Am: 55 mL/min — ABNORMAL LOW
GFR, Estimated: 47 mL/min — ABNORMAL LOW
Glucose, Bld: 92 mg/dL (ref 70–99)
Potassium: 4.1 mmol/L (ref 3.5–5.1)
Sodium: 140 mmol/L (ref 135–145)
Total Bilirubin: 0.7 mg/dL (ref 0.3–1.2)
Total Protein: 7.2 g/dL (ref 6.5–8.1)

## 2018-04-10 MED ORDER — PREDNISONE 5 MG PO TABS: ORAL_TABLET | ORAL | 0 refills | Status: DC

## 2018-04-10 NOTE — Telephone Encounter (Signed)
Gave avs and calendar ° °

## 2018-05-01 ENCOUNTER — Other Ambulatory Visit: Payer: Self-pay | Admitting: Oncology

## 2018-05-04 ENCOUNTER — Other Ambulatory Visit: Payer: Self-pay

## 2018-05-04 ENCOUNTER — Ambulatory Visit (HOSPITAL_COMMUNITY)
Admission: RE | Admit: 2018-05-04 | Discharge: 2018-05-04 | Disposition: A | Discharge status: Home / Self Care | Payer: Medicare Other | Source: Ambulatory Visit | Attending: Oncology | Admitting: Oncology

## 2018-05-04 DIAGNOSIS — R0781 Pleurodynia: Secondary | ICD-10-CM | POA: Diagnosis present

## 2018-05-04 DIAGNOSIS — C50112 Malignant neoplasm of central portion of left female breast: Secondary | ICD-10-CM | POA: Insufficient documentation

## 2018-05-04 DIAGNOSIS — Z170 Estrogen receptor positive status [ER+]: Secondary | ICD-10-CM | POA: Diagnosis present

## 2018-05-04 MED ORDER — IOHEXOL 300 MG/ML  SOLN
75.0000 mL | Freq: Once | INTRAMUSCULAR | Status: AC | PRN
  Administered 2018-05-04: 75 mL via INTRAVENOUS

## 2018-05-07 ENCOUNTER — Other Ambulatory Visit: Payer: Self-pay | Admitting: Oncology

## 2018-05-07 NOTE — Progress Notes (Signed)
I called and left a voicemail for Heidi Lutz on her cell phone giving her the good news on the CT scan

## 2018-06-22 ENCOUNTER — Other Ambulatory Visit: Payer: Self-pay | Admitting: Oncology

## 2018-09-17 ENCOUNTER — Other Ambulatory Visit: Payer: Self-pay | Admitting: *Deleted

## 2018-09-17 DIAGNOSIS — Z170 Estrogen receptor positive status [ER+]: Secondary | ICD-10-CM

## 2018-09-17 DIAGNOSIS — C50112 Malignant neoplasm of central portion of left female breast: Secondary | ICD-10-CM

## 2018-09-17 NOTE — Progress Notes (Signed)
Heidi Lutz  [phone] [phone]   ID: Heidi Lutz DOB: 1940-11-21  MR#: [id number]  OHY#:[id number]  Patient Care Team: [name], MD as PCP - General (Family Medicine) , [name], MD as Consulting Physician (Oncology) Erroll Luna, MD as Consulting Physician (General Surgery) OTHER MD:   CHIEF COMPLAINT: Estrogen receptor positive breast cancer  CURRENT TREATMENT: Tamoxifen   INTERVAL HISTORY: Seneca returns today for follow-up and treatment of her estrogen receptor positive breast cancer.   She continues on tamoxifen. She tolerates this very well and without any noticeable side effects. She notes occasional hot flashes at night, but she's not sure if they are true hot flashes or just her getting too warm under her blanket.   At her last visit here, 04/10/2018, she complained of left-sided rib cage pain.  She underwent chest CT on 05/04/2018. This was negative for metastatic disease.   REVIEW OF SYSTEMS: Heidi Lutz reports she has been staying in. She lives in a retirement community, [place], and has her meals brought to her.  Remarkably, they have had no cases of COVID-19 there.  A detailed review of systems was otherwise noncontributory.     HISTORY OF CURRENT ILLNESS: From the original intake note:  [name] had routine screening mammography on 02/27/2017 showing a possible asymmetry in the left breast. She underwent unilateral diagnostic mammography with tomography and left breast ultrasonography at [facility] on 03/03/2017 showing: breast density category C. Small left breast mass in the 3:30 o'clock lower outer position 5 cm from the nipple measuring 0.8 x 0.5 x 0.7 cm is suspicious for breast malignancy. The left axilla is negative for lymphadenopathy.   Accordingly on 03/08/2016 she proceeded to biopsy of the left breast mass in question. The pathology from this procedure showed ([id number]):  invasive mammary carcinoma. 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 Prognostic indicators significant for: estrogen receptor, 100% positive and progesterone receptor, 70% positive, both with strong staining intensity. Proliferation marker Ki67 at 2%. HER2 not amplified with ratio HER2/CEP17 signals 1.39 and average HER2 copies per cell 1.95.  The patient's subsequent history is as detailed below.   PAST MEDICAL HISTORY: Past Medical History:  Diagnosis Date  . Cancer ([facility]) 01/2017   left breast cancer, basal cell and 1 melanoma  . Complication of anesthesia    heart rate spikeed in the PACU on her 2nd knee surgery  . Environmental allergies   . High cholesterol   . Osteoarthritis   . Personal history of radiation therapy   . PONV (postoperative nausea and vomiting)    with 1st knee surgery had n/v, none since  . Raynaud disease    Raynauds disease-takes procardia  . Rheumatoid arthritis ([facility])     PAST SURGICAL HISTORY: Past Surgical History:  Procedure Laterality Date  . ABDOMINAL HYSTERECTOMY  1972  . APPENDECTOMY  1953  . BREAST LUMPECTOMY    . BREAST LUMPECTOMY WITH RADIOACTIVE SEED AND SENTINEL LYMPH NODE BIOPSY Left 04/07/2017   Procedure: BREAST LUMPECTOMY WITH RADIOACTIVE SEED AND SENTINEL LYMPH NODE BIOPSY;  Surgeon: Erroll Luna, MD;  Location: [facility];  Service: General;  Laterality: Left;  . BUNIONECTOMY  1999  . COLONOSCOPY    . EYE SURGERY     bil cataract  . GANGLION CYST EXCISION  1968  . JOINT REPLACEMENT Left 2017    reverse shoulder replacement  . REPLACEMENT TOTAL KNEE  2012, 2013   Right and Left  . SHOULDER ARTHROSCOPY Left   .  SYMPATHECTOMY  1970  . TONSILLECTOMY  1949    FAMILY HISTORY: Family History  Adopted: Yes  Problem Relation Age of Onset  . Breast cancer Neg Hx    The patient is adopted and has no information regarding her biologic family.   GYNECOLOGIC HISTORY:  No LMP recorded. Patient has had a  hysterectomy. Menarche: X years old Age at first live birth: 78 years old [place] P 2 LMP status post hysterectomy at age 12 Contraceptive: n/a HRT yes, 5 years, including progesterone  SO?  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  Daughter [name] is a Scientist, research (physical sciences) in [employer].  Daughter [name] is an Location manager in [place].  The patient has 1 biologic grandchild and 3 step grandchildren. She is a Furniture conservator/restorer    ADVANCED DIRECTIVES:    HEALTH MAINTENANCE: Social History   Tobacco Use  . Smoking status: Former Smoker    Quit date: 02/22/1963    Years since quitting: 55.6  . Smokeless tobacco: Never Used  Substance Use Topics  . Alcohol use: Yes    Alcohol/week: 14.0 standard drinks    Types: 14 Glasses of wine per week    Comment: 2 glasses of wine daily.   . Drug use: No    Colonoscopy: 2014  PAP: Status post hysterectomy  Bone density: 02/27/2017 showed a T scored of -1.8 osteopenia    Allergies  Allergen Reactions  . Fluorouracil Hives  . Codeine Nausea And Vomiting  . Hydrocodone Nausea And Vomiting  . Penicillins Swelling, Rash and Other (See Comments)    Pt states allergic to all "cillin" drugs. Joint pain. Has patient had a PCN reaction causing immediate rash, facial/tongue/throat swelling, SOB or lightheadedness with hypotension: Yes Has patient had a PCN reaction causing severe rash involving mucus membranes or skin necrosis: No Has patient had a PCN reaction that required hospitalization: No Has patient had a PCN reaction occurring within the last 10 years: No If all of the above answers are "NO", then may proceed with Cephalosporin use.     Current Outpatient Medications  Medication Sig Dispense Refill  . acetaminophen (TYLENOL) 500 MG tablet Take 1,000 mg by mouth every 6 (six) hours as needed for moderate pain or headache.     Marland Kitchen aspirin EC 81 MG tablet Take 81 mg  by mouth daily.    Marland Kitchen atorvastatin (LIPITOR) 20 MG tablet Take 20 mg by mouth daily.    . Biotin (SUPER BIOTIN) 5 MG TABS Take 5 mg by mouth daily.     . Black Cohosh 540 MG CAPS Take 540 mg by mouth daily.     . calcium-vitamin D (OSCAL WITH D) 500-200 MG-UNIT TABS tablet Take 1 tablet by mouth daily.     . cetirizine (ZYRTEC) 10 MG tablet Take 10 mg by mouth daily.    . Cholecalciferol (VITAMIN D) 2000 units CAPS Take 2,000 Units by mouth daily.     Marland Kitchen co-enzyme Q-10 50 MG capsule Take 50 mg by mouth daily.     Marland Kitchen CRANBERRY PO Take 1 capsule by mouth daily.     Marland Kitchen dicyclomine (BENTYL) 10 MG capsule Take 10 mg by mouth twice daily  1  . Glucosamine-Chondroit-Vit C-Mn (GLUCOSAMINE-CHONDROITIN MAX ST) CAPS Take 1 capsule by mouth daily.     . Magnesium 200 MG TABS Take 200 mg by mouth daily.     . Multiple  Vitamins-Minerals (CENTRUM ADULTS PO) Take 1 tablet by mouth daily.     Marland Kitchen NIFEdipine (PROCARDIA XL/ADALAT-CC) 90 MG 24 hr tablet Take 90 mg by mouth daily.     Vladimir Faster Glycol-Propyl Glycol (SYSTANE OP) Place 1 drop into both eyes daily.    . Probiotic Product (PROBIOTIC ADVANCED PO) Take 1 capsule by mouth daily.     . tamoxifen (NOLVADEX) 20 MG tablet Take 1 tablet (20 mg total) by mouth daily. 90 tablet 4  . traMADol (ULTRAM) 50 MG tablet Take 50 mg by mouth 2 (two) times daily as needed for moderate pain.     Marland Kitchen zolpidem (AMBIEN) 5 MG tablet Take 5 mg by mouth at bedtime as needed for sleep.   0   No current facility-administered medications for this visit.     OBJECTIVE: Older white woman who is very chilly during today's visit Vitals:   09/18/18 1131  BP: (!) 148/82  Pulse: (!) 105  Resp: 18  Temp: 98.3 F (36.8 C)  SpO2: 100%     Body mass index is 25.2 kg/m.   Wt Readings from Last 3 Encounters:  09/18/18 142 lb 4 oz (64.5 kg)  04/10/18 139 lb 11.2 oz (63.4 kg)  09/26/17 138 lb 9.6 oz (62.9 kg)      ECOG FS:1 - Symptomatic but completely ambulatory   Sclerae  unicteric, EOMs intact Wearing a mask No cervical or supraclavicular adenopathy Lungs no rales or rhonchi Heart regular rate and rhythm Abd soft, nontender, positive bowel sounds MSK no focal spinal tenderness, no upper extremity lymphedema Neuro: nonfocal, well oriented, appropriate affect Breasts: The right breast is benign and the left breast is status post lumpectomy and radiation.  There is no evidence of local recurrence.  Both axillae are benign.   LAB RESULTS:  CMP     Component Value Date/Time   NA 140 04/10/2018 1119   K 4.1 04/10/2018 1119   CL 105 04/10/2018 1119   CO2 25 04/10/2018 1119   GLUCOSE 92 04/10/2018 1119   BUN 20 04/10/2018 1119   CREATININE 1.11 (H) 04/10/2018 1119   CALCIUM 8.9 04/10/2018 1119   PROT 7.2 04/10/2018 1119   ALBUMIN 3.9 04/10/2018 1119   AST 19 04/10/2018 1119   ALT <6 04/10/2018 1119   ALKPHOS 53 04/10/2018 1119   BILITOT 0.7 04/10/2018 1119   GFRNONAA 47 (L) 04/10/2018 1119   GFRAA 55 (L) 04/10/2018 1119    No results found for: TOTALPROTELP, ALBUMINELP, A1GS, A2GS, BETS, BETA2SER, GAMS, MSPIKE, SPEI  No results found for: KPAFRELGTCHN, LAMBDASER, KAPLAMBRATIO  Lab Results  Component Value Date   WBC 7.3 09/18/2018   NEUTROABS 4.1 09/18/2018   HGB 13.1 09/18/2018   HCT 39.6 09/18/2018   MCV 97.5 09/18/2018   PLT 258 09/18/2018    '@LASTCHEMISTRY'$ @  No results found for: LABCA2  No components found for: [id number]  No results for input(s): INR in the last 168 hours.  No results found for: LABCA2  No results found for: IPJ825  No results found for: KNL976  No results found for: BHA193  No results found for: CA2729  No components found for: HGQUANT  No results found for: CEA1 / No results found for: CEA1   No results found for: AFPTUMOR  No results found for: CHROMOGRNA  No results found for: PSA1  Appointment on 09/18/2018  Component Date Value Ref Range Status  . WBC Count 09/18/2018 7.3  4.0 - 10.5  K/uL Final  . RBC  09/18/2018 4.06  3.87 - 5.11 MIL/uL Final  . Hemoglobin 09/18/2018 13.1  12.0 - 15.0 g/dL Final  . HCT 09/18/2018 39.6  36.0 - 46.0 % Final  . MCV 09/18/2018 97.5  80.0 - 100.0 fL Final  . MCH 09/18/2018 32.3  26.0 - 34.0 pg Final  . MCHC 09/18/2018 33.1  30.0 - 36.0 g/dL Final  . RDW 09/18/2018 13.7  11.5 - 15.5 % Final  . Platelet Count 09/18/2018 258  150 - 400 K/uL Final  . nRBC 09/18/2018 0.0  0.0 - 0.2 % Final  . Neutrophils Relative % 09/18/2018 56  % Final  . Neutro Abs 09/18/2018 4.1  1.7 - 7.7 K/uL Final  . Lymphocytes Relative 09/18/2018 28  % Final  . Lymphs Abs 09/18/2018 2.0  0.7 - 4.0 K/uL Final  . Monocytes Relative 09/18/2018 11  % Final  . Monocytes Absolute 09/18/2018 0.8  0.1 - 1.0 K/uL Final  . Eosinophils Relative 09/18/2018 4  % Final  . Eosinophils Absolute 09/18/2018 0.3  0.0 - 0.5 K/uL Final  . Basophils Relative 09/18/2018 1  % Final  . Basophils Absolute 09/18/2018 0.1  0.0 - 0.1 K/uL Final  . Immature Granulocytes 09/18/2018 0  % Final  . Abs Immature Granulocytes 09/18/2018 0.02  0.00 - 0.07 K/uL Final   Performed at [facility], [place] [address] [place], [place] [address]    (this displays the last labs from the last 3 days)  No results found for: TOTALPROTELP, ALBUMINELP, A1GS, A2GS, BETS, BETA2SER, GAMS, MSPIKE, SPEI (this displays SPEP labs)  No results found for: KPAFRELGTCHN, LAMBDASER, KAPLAMBRATIO (kappa/lambda light chains)  No results found for: HGBA, HGBA2QUANT, HGBFQUANT, HGBSQUAN (Hemoglobinopathy evaluation)   No results found for: LDH  No results found for: IRON, TIBC, IRONPCTSAT (Iron and TIBC)  No results found for: FERRITIN  Urinalysis No results found for: COLORURINE, APPEARANCEUR, LABSPEC, PHURINE, GLUCOSEU, HGBUR, BILIRUBINUR, KETONESUR, PROTEINUR, UROBILINOGEN, NITRITE, LEUKOCYTESUR   STUDIES: No results found.   ELIGIBLE FOR AVAILABLE RESEARCH PROTOCOL: no   ASSESSMENT:  78 y.o. [place], [place] woman s/p central left breast biopsy 03/08/2017 for a clinical T1b N0, stage IA invasive ductal carcinoma, grade 1, estrogen and progesterone receptor positive, HER-2 not amplified, with an MIB-1 of 2%  (1)  Status post left breast lumpectomy with sentinel lymph node sampling 04/07/2017 showed a  pT1B pN0, stage IA invasive ductal carcinoma, grade II, with negative margins  (a) total 1 lymph node removed from the axilla  (2) adjuvant radiation 05/10/2017 - 06/06/2017 Site/dose: Breast_Lt/ 42.5 Gy delivered in 17 fractions of 2.5 Gy Breast_Lt_Bst/ 7.5 Gy delivered in 3 fractions of 2.5 Gy  (3) tamoxifen started 03/17/2017  (a) bone density at the breast center 02/28/2017 showed a T score of -1.8  (b) status post hysterectomy  (c) status post bilateral cataract surgery  (d) history of 5 years of hormone replacement with no clotting complications  (4) consider genetics testing and this adopted patient   PLAN: Johnny is now about a year and a half out from definitive surgery for breast cancer with no evidence of disease recurrence.  This is very favorable.  She is tolerating tamoxifen well and the plan will be to continue that a total of 5 years.  She is very interested in genetics testing.  I am referring her to genetics to get that accomplished.  Recall that she has no information on either side of her family as she was adopted  She is  taking appropriate pandemic precautions  She will have her next mammogram in January and she will return to see me February of next year  She knows to call for any other issues that may develop before that visit.  , [name], MD  09/18/18 11:56 AM Medical Oncology and Hematology [facility] [address] [place], [place] [address] Tel. [phone]    Fax. [phone]   I, Wilburn Mylar, am acting as scribe for Dr. [name]. .  I, Lurline Del MD, have reviewed the above documentation  for accuracy and completeness, and I agree with the above.

## 2018-09-18 ENCOUNTER — Inpatient Hospital Stay: Payer: Medicare Other | Admitting: Oncology

## 2018-09-18 ENCOUNTER — Other Ambulatory Visit: Payer: Self-pay

## 2018-09-18 ENCOUNTER — Inpatient Hospital Stay: Payer: Medicare Other | Attending: Oncology

## 2018-09-18 VITALS — BP 148/82 | HR 105 | Temp 98.3°F | Resp 18 | Ht 63.0 in | Wt 142.2 lb

## 2018-09-18 DIAGNOSIS — C50512 Malignant neoplasm of lower-outer quadrant of left female breast: Secondary | ICD-10-CM | POA: Insufficient documentation

## 2018-09-18 DIAGNOSIS — M069 Rheumatoid arthritis, unspecified: Secondary | ICD-10-CM | POA: Diagnosis not present

## 2018-09-18 DIAGNOSIS — Z79810 Long term (current) use of selective estrogen receptor modulators (SERMs): Secondary | ICD-10-CM | POA: Diagnosis not present

## 2018-09-18 DIAGNOSIS — C50112 Malignant neoplasm of central portion of left female breast: Secondary | ICD-10-CM | POA: Diagnosis not present

## 2018-09-18 DIAGNOSIS — E7800 Pure hypercholesterolemia, unspecified: Secondary | ICD-10-CM | POA: Diagnosis not present

## 2018-09-18 DIAGNOSIS — Z803 Family history of malignant neoplasm of breast: Secondary | ICD-10-CM | POA: Diagnosis not present

## 2018-09-18 DIAGNOSIS — Z79899 Other long term (current) drug therapy: Secondary | ICD-10-CM

## 2018-09-18 DIAGNOSIS — Z85820 Personal history of malignant melanoma of skin: Secondary | ICD-10-CM | POA: Diagnosis not present

## 2018-09-18 DIAGNOSIS — Z170 Estrogen receptor positive status [ER+]: Secondary | ICD-10-CM

## 2018-09-18 DIAGNOSIS — Z7982 Long term (current) use of aspirin: Secondary | ICD-10-CM | POA: Diagnosis not present

## 2018-09-18 DIAGNOSIS — Z87891 Personal history of nicotine dependence: Secondary | ICD-10-CM

## 2018-09-18 DIAGNOSIS — Z90710 Acquired absence of both cervix and uterus: Secondary | ICD-10-CM | POA: Diagnosis not present

## 2018-09-18 DIAGNOSIS — I447 Left bundle-branch block, unspecified: Secondary | ICD-10-CM

## 2018-09-18 DIAGNOSIS — Z923 Personal history of irradiation: Secondary | ICD-10-CM | POA: Diagnosis not present

## 2018-09-18 DIAGNOSIS — E782 Mixed hyperlipidemia: Secondary | ICD-10-CM

## 2018-09-18 LAB — CBC WITH DIFFERENTIAL (CANCER CENTER ONLY)
Abs Immature Granulocytes: 0.02 10*3/uL (ref 0.00–0.07)
Basophils Absolute: 0.1 10*3/uL (ref 0.0–0.1)
Basophils Relative: 1 %
Eosinophils Absolute: 0.3 10*3/uL (ref 0.0–0.5)
Eosinophils Relative: 4 %
HCT: 39.6 % (ref 36.0–46.0)
Hemoglobin: 13.1 g/dL (ref 12.0–15.0)
Immature Granulocytes: 0 %
Lymphocytes Relative: 28 %
Lymphs Abs: 2 10*3/uL (ref 0.7–4.0)
MCH: 32.3 pg (ref 26.0–34.0)
MCHC: 33.1 g/dL (ref 30.0–36.0)
MCV: 97.5 fL (ref 80.0–100.0)
Monocytes Absolute: 0.8 10*3/uL (ref 0.1–1.0)
Monocytes Relative: 11 %
Neutro Abs: 4.1 10*3/uL (ref 1.7–7.7)
Neutrophils Relative %: 56 %
Platelet Count: 258 10*3/uL (ref 150–400)
RBC: 4.06 MIL/uL (ref 3.87–5.11)
RDW: 13.7 % (ref 11.5–15.5)
WBC Count: 7.3 10*3/uL (ref 4.0–10.5)
nRBC: 0 % (ref 0.0–0.2)

## 2018-09-18 LAB — CMP (CANCER CENTER ONLY)
ALT: 8 U/L (ref 0–44)
AST: 23 U/L (ref 15–41)
Albumin: 3.9 g/dL (ref 3.5–5.0)
Alkaline Phosphatase: 56 U/L (ref 38–126)
Anion gap: 12 (ref 5–15)
BUN: 26 mg/dL — ABNORMAL HIGH (ref 8–23)
CO2: 26 mmol/L (ref 22–32)
Calcium: 9.5 mg/dL (ref 8.9–10.3)
Chloride: 105 mmol/L (ref 98–111)
Creatinine: 1.42 mg/dL — ABNORMAL HIGH (ref 0.44–1.00)
GFR, Est AFR Am: 41 mL/min — ABNORMAL LOW (ref >60)
GFR, Estimated: 35 mL/min — ABNORMAL LOW (ref >60)
Glucose, Bld: 114 mg/dL — ABNORMAL HIGH (ref 70–99)
Potassium: 4.4 mmol/L (ref 3.5–5.1)
Sodium: 143 mmol/L (ref 135–145)
Total Bilirubin: 0.5 mg/dL (ref 0.3–1.2)
Total Protein: 7.4 g/dL (ref 6.5–8.1)

## 2018-09-18 MED ORDER — TAMOXIFEN CITRATE 20 MG PO TABS: 20.0000 mg | ORAL_TABLET | Freq: Every day | ORAL | 4 refills | Status: DC

## 2018-09-19 ENCOUNTER — Telehealth: Payer: Self-pay | Admitting: Oncology

## 2018-09-19 NOTE — Telephone Encounter (Signed)
I talk with patient regarding schedule  

## 2018-09-27 ENCOUNTER — Encounter: Payer: Medicare Other | Admitting: Genetic Counselor

## 2019-01-04 DIAGNOSIS — M12811 Other specific arthropathies, not elsewhere classified, right shoulder: Secondary | ICD-10-CM

## 2019-01-04 HISTORY — DX: Other specific arthropathies, not elsewhere classified, right shoulder: M12.811

## 2019-01-16 ENCOUNTER — Other Ambulatory Visit: Payer: Self-pay | Admitting: Family Medicine

## 2019-01-16 DIAGNOSIS — Z853 Personal history of malignant neoplasm of breast: Secondary | ICD-10-CM

## 2019-01-28 ENCOUNTER — Ambulatory Visit: Payer: Medicare Other | Admitting: Cardiology

## 2019-03-04 ENCOUNTER — Ambulatory Visit
Admission: RE | Admit: 2019-03-04 | Discharge: 2019-03-04 | Disposition: A | Discharge status: Home / Self Care | Payer: Medicare Other | Source: Ambulatory Visit | Attending: Family Medicine | Admitting: Family Medicine

## 2019-03-04 ENCOUNTER — Other Ambulatory Visit: Payer: Self-pay

## 2019-03-04 DIAGNOSIS — Z853 Personal history of malignant neoplasm of breast: Secondary | ICD-10-CM

## 2019-04-12 ENCOUNTER — Other Ambulatory Visit: Payer: Self-pay | Admitting: *Deleted

## 2019-04-12 DIAGNOSIS — C50112 Malignant neoplasm of central portion of left female breast: Secondary | ICD-10-CM

## 2019-04-12 DIAGNOSIS — Z170 Estrogen receptor positive status [ER+]: Secondary | ICD-10-CM

## 2019-04-14 NOTE — Progress Notes (Signed)
[place]  [phone] [phone]   ID: Heidi Lutz DOB: 1941/02/07  MR#: [id number]  SFK#:[id number]  Patient Care Team: [name], MD as PCP - General (Family Medicine) Romel Dumond, [name], MD as Consulting Physician (Oncology) Erroll Luna, MD as Consulting Physician (General Surgery) OTHER MD:   CHIEF COMPLAINT: Estrogen receptor positive breast cancer  CURRENT TREATMENT: Tamoxifen   INTERVAL HISTORY: Heidi Lutz returns today for follow-up and treatment of her estrogen receptor positive breast cancer.   She continues on tamoxifen.  She has no problems with hot flashes or vaginal wetness.  She tolerates this medication with no side effects that she is aware of  Since her last visit, she underwent bilateral diagnostic mammography with tomography at [facility] on 03/04/2019 showing: breast density category C; limited exam due to inability to apply adequate compression to the left breast; within this limitation, no evidence of malignancy in either breast.  A 68-monthfollow-up is being scheduled  At the last visit a genetics testing appointment was discussed but apparently not made.  We discussed that again today and she remains interested  REVIEW OF SYSTEMS: NElliettells me her husband BRush Landmarknearly died around Christmas time.  First of all he was found to have a meningioma which was causing hemiparesis symptoms.  [facility]  He also had small bowel obstruction and an abscess with sepsis.  They both have had both their Covid shots.  NJonishaherself remains very active, with her main problem being a compression fracture.  She is considering kyphoplasty.  A detailed review of systems today was otherwise noncontributory    HISTORY OF CURRENT ILLNESS: From the original intake note:  NTylie Golonkahad routine screening mammography on 02/27/2017 showing a  possible asymmetry in the left breast. She underwent unilateral diagnostic mammography with tomography [facility] showing: breast density category C. Small left breast mass in the 3:30 o'clock lower outer position 5 cm from the nipple measuring 0.8 x 0.5 x 0.7 cm is suspicious for breast malignancy. The left axilla is negative for lymphadenopathy.   Accordingly on 03/08/2016 she proceeded to biopsy of the left breast mass in question. The pathology from this procedure showed (([id number]: invasive mammary carcinoma. 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 Prognostic indicators significant for: estrogen receptor, 100% positive and progesterone receptor, 70% positive, both with strong staining intensity. Proliferation marker Ki67 at 2%. HER2 not amplified with ratio HER2/CEP17 signals 1.39 and average HER2 copies per cell 1.95.  The patient's subsequent history is as detailed below.   PAST MEDICAL HISTORY: Past Medical History:  Diagnosis Date  . [place]   left breast cancer, basal cell and 1 melanoma  . Complication of anesthesia    heart rate spikeed in the PACU on her 2nd knee surgery  . Environmental allergies   . High cholesterol   . Osteoarthritis   . Personal history of radiation therapy   . PONV (postoperative nausea and vomiting)    with 1st knee surgery had n/v, none since  . Raynaud disease    Raynauds disease-takes procardia  . Rheumatoid arthritis ([facility]     PAST SURGICAL HISTORY: Past Surgical History:  Procedure Laterality Date  . ABDOMINAL HYSTERECTOMY  1972  . APPENDECTOMY  1953  . BREAST LUMPECTOMY Left 2019  . BREAST LUMPECTOMY WITH RADIOACTIVE SEED  AND SENTINEL LYMPH NODE BIOPSY Left 04/07/2017   Procedure: BREAST LUMPECTOMY WITH RADIOACTIVE SEED AND SENTINEL LYMPH NODE BIOPSY;  Surgeon: Erroll Luna, MD;  Location: [facility];  Service: General;  Laterality: Left;  . BUNIONECTOMY   1999  . COLONOSCOPY    . EYE SURGERY     bil cataract  . GANGLION CYST EXCISION  1968  . JOINT REPLACEMENT Left 2017    reverse shoulder replacement  . REPLACEMENT TOTAL KNEE  2012, 2013   Right and Left  . SHOULDER ARTHROSCOPY Left   . SYMPATHECTOMY  1970  . TONSILLECTOMY  1949    FAMILY HISTORY: Family History  Adopted: Yes  Problem Relation Age of Onset  . Breast cancer Neg Hx    The patient is adopted and has no information regarding her biologic family.   GYNECOLOGIC HISTORY:  No LMP recorded. Patient has had a hysterectomy. Menarche: X years old Age at first live birth: 79 years old Rhea P 2 LMP status post hysterectomy at age 72 Contraceptive: n/a HRT yes, 5 years, including progesterone  SO?  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  Daughter [name] is a Scientist, research (physical sciences) in [employer].  Daughter [name] is an Location manager in [place].  The patient has 1 biologic grandchild and 3 step grandchildren. She is a [place]    ADVANCED DIRECTIVES: In the absence of any documentation to the contrary, the patient's spouse is their HCPOA.    HEALTH MAINTENANCE: Social History   Tobacco Use  . Smoking status: Former Smoker    Quit date: 02/22/1963    Years since quitting: 56.1  . Smokeless tobacco: Never Used  Substance Use Topics  . Alcohol use: Yes    Alcohol/week: 14.0 standard drinks    Types: 14 Glasses of wine per week    Comment: 2 glasses of wine daily.   . Drug use: No    Colonoscopy: 2014  PAP: Status post hysterectomy  Bone density: 02/27/2017 showed a T scored of -1.8 osteopenia    Allergies  Allergen Reactions  . Fluorouracil Hives  . Codeine Nausea And Vomiting  . Hydrocodone Nausea And Vomiting  . Penicillins Swelling, Rash and Other (See Comments)    Pt states allergic to all "cillin" drugs. Joint pain. Has patient had a PCN reaction causing immediate  rash, facial/tongue/throat swelling, SOB or lightheadedness with hypotension: Yes Has patient had a PCN reaction causing severe rash involving mucus membranes or skin necrosis: No Has patient had a PCN reaction that required hospitalization: No Has patient had a PCN reaction occurring within the last 10 years: No If all of the above answers are "NO", then may proceed with Cephalosporin use.     Current Outpatient Medications  Medication Sig Dispense Refill  . acetaminophen (TYLENOL) 500 MG tablet Take 1,000 mg by mouth every 6 (six) hours as needed for moderate pain or headache.     Marland Kitchen aspirin EC 81 MG tablet Take 81 mg by mouth daily.    Marland Kitchen atorvastatin (LIPITOR) 20 MG tablet Take 20 mg by mouth daily.    . Biotin (SUPER BIOTIN) 5 MG TABS Take 5 mg by mouth daily.     . Black Cohosh 540 MG CAPS Take 540 mg by mouth daily.     . calcium-vitamin D (OSCAL WITH D) 500-200 MG-UNIT TABS tablet Take 1 tablet by mouth daily.     Marland Kitchen  cetirizine (ZYRTEC) 10 MG tablet Take 10 mg by mouth daily.    . Cholecalciferol (VITAMIN D) 2000 units CAPS Take 2,000 Units by mouth daily.     Marland Kitchen co-enzyme Q-10 50 MG capsule Take 50 mg by mouth daily.     Marland Kitchen CRANBERRY PO Take 1 capsule by mouth daily.     Marland Kitchen dicyclomine (BENTYL) 10 MG capsule Take 10 mg by mouth twice daily  1  . Glucosamine-Chondroit-Vit C-Mn (GLUCOSAMINE-CHONDROITIN MAX ST) CAPS Take 1 capsule by mouth daily.     . Magnesium 200 MG TABS Take 200 mg by mouth daily.     . Multiple Vitamins-Minerals (CENTRUM ADULTS PO) Take 1 tablet by mouth daily.     Marland Kitchen NIFEdipine (PROCARDIA XL/ADALAT-CC) 90 MG 24 hr tablet Take 90 mg by mouth daily.     Vladimir Faster Glycol-Propyl Glycol (SYSTANE OP) Place 1 drop into both eyes daily.    . Probiotic Product (PROBIOTIC ADVANCED PO) Take 1 capsule by mouth daily.     . tamoxifen (NOLVADEX) 20 MG tablet Take 1 tablet (20 mg total) by mouth daily. 90 tablet 4  . traMADol (ULTRAM) 50 MG tablet Take 50 mg by mouth 2 (two)  times daily as needed for moderate pain.     Marland Kitchen zolpidem (AMBIEN) 5 MG tablet Take 5 mg by mouth at bedtime as needed for sleep.   0   No current facility-administered medications for this visit.    OBJECTIVE: Older white woman in no acute distress t Vitals:   04/15/19 1124  BP: (!) 110/94  Pulse: 90  Resp: 18  Temp: (!) 97.3 F (36.3 C)  SpO2: 98%     Body mass index is 24.85 kg/m.   Wt Readings from Last 3 Encounters:  04/15/19 140 lb 4.8 oz (63.6 kg)  09/18/18 142 lb 4 oz (64.5 kg)  04/10/18 139 lb 11.2 oz (63.4 kg)      ECOG FS:1 - Symptomatic but completely ambulatory   Sclerae unicteric, EOMs intact Wearing a mask No cervical or supraclavicular adenopathy Lungs no rales or rhonchi Heart regular rate and rhythm Abd soft, nontender, positive bowel sounds MSK no focal spinal tenderness to moderate palpation, no upper extremity lymphedema Neuro: nonfocal, well oriented, appropriate affect Breasts: The right breast is unremarkable.  The left breast is status post lumpectomy and radiation.  There is moderate distortion of the breast contour and significant scar tissue.  There is no finding suspicious for disease recurrence.  Both axillae are benign.   LAB RESULTS:  CMP     Component Value Date/Time   NA 143 04/15/2019 1105   K 4.8 04/15/2019 1105   CL 104 04/15/2019 1105   CO2 27 04/15/2019 1105   GLUCOSE 99 04/15/2019 1105   BUN 17 04/15/2019 1105   CREATININE 1.29 (H) 04/15/2019 1105   CALCIUM 9.3 04/15/2019 1105   PROT 7.2 04/15/2019 1105   ALBUMIN 3.8 04/15/2019 1105   AST 16 04/15/2019 1105   ALT 7 04/15/2019 1105   ALKPHOS 51 04/15/2019 1105   BILITOT 0.6 04/15/2019 1105   GFRNONAA 39 (L) 04/15/2019 1105   GFRAA 45 (L) 04/15/2019 1105    No results found for: TOTALPROTELP, ALBUMINELP, A1GS, A2GS, BETS, BETA2SER, GAMS, MSPIKE, SPEI  No results found for: KPAFRELGTCHN, LAMBDASER, KAPLAMBRATIO  Lab Results  Component Value Date   WBC 7.5 04/15/2019    NEUTROABS 3.6 04/15/2019   HGB 12.3 04/15/2019   HCT 37.8 04/15/2019   MCV 100.8 (H) 04/15/2019  PLT 309 04/15/2019   No results found for: LABCA2  No components found for: [id number]  No results for input(s): INR in the last 168 hours.  No results found for: LABCA2  No results found for: ATF573  No results found for: UKG254  No results found for: YHC623  No results found for: CA2729  No components found for: HGQUANT  No results found for: CEA1 / No results found for: CEA1   No results found for: AFPTUMOR  No results found for: CHROMOGRNA  No results found for: HGBA, HGBA2QUANT, HGBFQUANT, HGBSQUAN (Hemoglobinopathy evaluation)   No results found for: LDH  No results found for: IRON, TIBC, IRONPCTSAT (Iron and TIBC)  No results found for: FERRITIN  Urinalysis No results found for: COLORURINE, APPEARANCEUR, LABSPEC, PHURINE, GLUCOSEU, HGBUR, BILIRUBINUR, KETONESUR, PROTEINUR, UROBILINOGEN, NITRITE, LEUKOCYTESUR   STUDIES: No results found.   ELIGIBLE FOR AVAILABLE RESEARCH PROTOCOL: no   ASSESSMENT: 79 y.o. [place], [place] woman s/p central left breast biopsy 03/08/2017 for a clinical T1b N0, stage IA invasive ductal carcinoma, grade 1, estrogen and progesterone receptor positive, HER-2 not amplified, with an MIB-1 of 2%  (1)  Status post left breast lumpectomy with sentinel lymph node sampling 04/07/2017 showed a  pT1b pN0, stage IA invasive ductal carcinoma, grade II, with negative margins  (a) total 1 lymph node removed from the axilla  (2) adjuvant radiation 05/10/2017 - 06/06/2017 Site/dose: Breast_Lt/ 42.5 Gy delivered in 17 fractions of 2.5 Gy Breast_Lt_Bst/ 7.5 Gy delivered in 3 fractions of 2.5 Gy  (3) tamoxifen started 03/17/2017  (a) bone density at the breast center 02/28/2017 showed a T score of -1.8  (b) status post hysterectomy  (c) status post bilateral cataract surgery  (d) history of 5 years of hormone replacement with no clotting  complications  (4) consider genetics testing in this adopted patient   PLAN: Chrisha is now 2 years out from definitive surgery for her breast cancer with no evidence of disease recurrence.  This is very favorable.  She is tolerating tamoxifen well and the plan is to continue that a minimum of 5 years.  We discussed her osteoporosis issue and I gave her a copy of her most recent bone scan.  We discussed her possibly considering Prolia and she will discuss this with her primary care physician.  Sharman tells me her teeth are fine and she gets them checked twice a year  She is considering kyphoplasty and had some concerns regarding that which we also discussed  I am hopeful that Rush Landmark will more fully recover.  I am placing a referral to genetics.  Hopefully she can get the testing done before her next visit here.  I am delighted that she has had both her Covid shots.  Total encounter time 30 minutes.*  Croy Drumwright, [name], MD  04/15/19 12:06 PM Medical Oncology and Hematology [facility] [place], [place] [address] Tel. [phone]    Fax. [phone]   I, Wilburn Mylar, am acting as scribe for Dr. [name]. Emmaleigh Longo.  I, Lurline Del MD, have reviewed the above documentation for accuracy and completeness, and I agree with the above.   *Total Encounter Time as defined by the Centers for Medicare and Medicaid Services includes, in addition to the face-to-face time of a patient visit (documented in the note above) non-face-to-face time: obtaining and reviewing outside history, ordering and reviewing medications, tests or procedures, care coordination (communications with other health care professionals or caregivers) and documentation in  the medical record.

## 2019-04-15 ENCOUNTER — Inpatient Hospital Stay: Payer: Medicare PPO

## 2019-04-15 ENCOUNTER — Other Ambulatory Visit: Payer: Self-pay

## 2019-04-15 ENCOUNTER — Inpatient Hospital Stay: Payer: Medicare PPO | Attending: Oncology | Admitting: Oncology

## 2019-04-15 VITALS — BP 110/94 | HR 90 | Temp 97.3°F | Resp 18 | Ht 63.0 in | Wt 140.3 lb

## 2019-04-15 DIAGNOSIS — Z85820 Personal history of malignant melanoma of skin: Secondary | ICD-10-CM | POA: Diagnosis not present

## 2019-04-15 DIAGNOSIS — Z170 Estrogen receptor positive status [ER+]: Secondary | ICD-10-CM | POA: Diagnosis not present

## 2019-04-15 DIAGNOSIS — M069 Rheumatoid arthritis, unspecified: Secondary | ICD-10-CM | POA: Diagnosis not present

## 2019-04-15 DIAGNOSIS — Z79890 Hormone replacement therapy (postmenopausal): Secondary | ICD-10-CM | POA: Insufficient documentation

## 2019-04-15 DIAGNOSIS — Z7982 Long term (current) use of aspirin: Secondary | ICD-10-CM | POA: Diagnosis not present

## 2019-04-15 DIAGNOSIS — Z90710 Acquired absence of both cervix and uterus: Secondary | ICD-10-CM | POA: Diagnosis not present

## 2019-04-15 DIAGNOSIS — C50112 Malignant neoplasm of central portion of left female breast: Secondary | ICD-10-CM

## 2019-04-15 DIAGNOSIS — I7300 Raynaud's syndrome without gangrene: Secondary | ICD-10-CM | POA: Diagnosis not present

## 2019-04-15 DIAGNOSIS — C50512 Malignant neoplasm of lower-outer quadrant of left female breast: Secondary | ICD-10-CM | POA: Diagnosis present

## 2019-04-15 DIAGNOSIS — Z87891 Personal history of nicotine dependence: Secondary | ICD-10-CM | POA: Diagnosis not present

## 2019-04-15 DIAGNOSIS — Z923 Personal history of irradiation: Secondary | ICD-10-CM | POA: Insufficient documentation

## 2019-04-15 DIAGNOSIS — I447 Left bundle-branch block, unspecified: Secondary | ICD-10-CM

## 2019-04-15 DIAGNOSIS — E782 Mixed hyperlipidemia: Secondary | ICD-10-CM

## 2019-04-15 DIAGNOSIS — Z79810 Long term (current) use of selective estrogen receptor modulators (SERMs): Secondary | ICD-10-CM | POA: Diagnosis not present

## 2019-04-15 DIAGNOSIS — E7800 Pure hypercholesterolemia, unspecified: Secondary | ICD-10-CM | POA: Insufficient documentation

## 2019-04-15 DIAGNOSIS — Z79899 Other long term (current) drug therapy: Secondary | ICD-10-CM | POA: Diagnosis not present

## 2019-04-15 LAB — CMP (CANCER CENTER ONLY)
ALT: 7 U/L (ref 0–44)
AST: 16 U/L (ref 15–41)
Albumin: 3.8 g/dL (ref 3.5–5.0)
Alkaline Phosphatase: 51 U/L (ref 38–126)
Anion gap: 12 (ref 5–15)
BUN: 17 mg/dL (ref 8–23)
CO2: 27 mmol/L (ref 22–32)
Calcium: 9.3 mg/dL (ref 8.9–10.3)
Chloride: 104 mmol/L (ref 98–111)
Creatinine: 1.29 mg/dL — ABNORMAL HIGH (ref 0.44–1.00)
GFR, Est AFR Am: 45 mL/min — ABNORMAL LOW (ref >60)
GFR, Estimated: 39 mL/min — ABNORMAL LOW (ref >60)
Glucose, Bld: 99 mg/dL (ref 70–99)
Potassium: 4.8 mmol/L (ref 3.5–5.1)
Sodium: 143 mmol/L (ref 135–145)
Total Bilirubin: 0.6 mg/dL (ref 0.3–1.2)
Total Protein: 7.2 g/dL (ref 6.5–8.1)

## 2019-04-15 LAB — CBC WITH DIFFERENTIAL (CANCER CENTER ONLY)
Abs Immature Granulocytes: 0.01 10*3/uL (ref 0.00–0.07)
Basophils Absolute: 0.1 10*3/uL (ref 0.0–0.1)
Basophils Relative: 1 %
Eosinophils Absolute: 0.4 10*3/uL (ref 0.0–0.5)
Eosinophils Relative: 5 %
HCT: 37.8 % (ref 36.0–46.0)
Hemoglobin: 12.3 g/dL (ref 12.0–15.0)
Immature Granulocytes: 0 %
Lymphocytes Relative: 31 %
Lymphs Abs: 2.3 10*3/uL (ref 0.7–4.0)
MCH: 32.8 pg (ref 26.0–34.0)
MCHC: 32.5 g/dL (ref 30.0–36.0)
MCV: 100.8 fL — ABNORMAL HIGH (ref 80.0–100.0)
Monocytes Absolute: 1 10*3/uL (ref 0.1–1.0)
Monocytes Relative: 14 %
Neutro Abs: 3.6 10*3/uL (ref 1.7–7.7)
Neutrophils Relative %: 49 %
Platelet Count: 309 10*3/uL (ref 150–400)
RBC: 3.75 MIL/uL — ABNORMAL LOW (ref 3.87–5.11)
RDW: 13.5 % (ref 11.5–15.5)
WBC Count: 7.5 10*3/uL (ref 4.0–10.5)
nRBC: 0 % (ref 0.0–0.2)

## 2019-04-16 ENCOUNTER — Telehealth: Payer: Self-pay | Admitting: Oncology

## 2019-04-16 NOTE — Telephone Encounter (Signed)
I left a message regarding schedule  

## 2019-04-25 ENCOUNTER — Telehealth: Payer: Medicare PPO | Admitting: Genetic Counselor

## 2019-05-02 NOTE — Progress Notes (Signed)
Cardiology Office Note:    Date:  05/03/2019   ID:  Heidi Lutz, Heidi Lutz [birth year], MRN [id number]  PCP:  [name], MD  Cardiologist:  Shirlee More, MD   Referring MD: [name], MD  ASSESSMENT:    1. LBBB (left bundle branch block)   2. Essential hypertension   3. Mixed hyperlipidemia   4. Malignant neoplasm of central portion of left breast in female, estrogen receptor positive ([place])    PLAN:    In order of problems listed above:  1. Rate related left bundle branch block today normal EKG 2. BP at target continue current calcium channel blocker 3. Continue her current high intensity statin increased cardiovascular risk with breast cancer XRT 4. Check echocardiogram regarding cardiomyopathy with rate related left bundle branch block and radiation therapy  Next appointment 1 year   Medication Adjustments/Labs and Tests Ordered: Current medicines are reviewed at length with the patient today.  Concerns regarding medicines are outlined above.  Orders Placed This Encounter  Procedures  . EKG 12-Lead  . ECHOCARDIOGRAM COMPLETE   No orders of the defined types were placed in this encounter.    Follow-up with concerns of cardiac issues after XRT and left bundle branch block  History of Present Illness:     Heidi Lutz is a 79 y.o. female the history of hypertension hyperlipidemia and left bundle branch block who is being seen today for the evaluation of vascular risk associated with breast cancer and treatment at the request of Magrinat, [name], MD Oncology.    She has a history ofclinical T1b N0, stage IA invasive ductal carcinoma, grade 1, estrogen and progesterone receptor positive, HER-2 not amplified, with an MIB-1 of 2%   (1)  Status post left breast lumpectomy with sentinel lymph node sampling 04/07/2017 showed a  pT1b pN0, stage IA invasive ductal carcinoma, grade II, with negative margins             (a) total 1 lymph node removed from  the axilla   (2) adjuvant radiation 05/10/2017 - 06/06/2017 Site/dose: Breast_Lt/ 42.5 Gy delivered in 17 fractions of 2.5 Gy Breast_Lt_Bst/ 7.5 Gy delivered in 3 fractions of 2.5 Gy   (3) tamoxifen started 03/17/2017             (a) bone density at the breast center 02/28/2017 showed a T score of -1.8             (b) status post hysterectomy             (c) status post bilateral cataract surgery             (d) history of 5 years of hormone replacement with no clotting complications  She had a cardiovascular evaluation including a Myoview study performed 03/24/2017 which showed soft tissue attenuation no ischemia felt to be otherwise normal perfusion ejection fraction 61% no significant regional left ventricular dysfunction and she did have a hypertensive response to exercise.  The EKG was not ischemic.  Study was low risk.  At that time she was seen by my partner Dr. Guy Sandifer and preoperative evaluation had left bundle branch block and was felt to require evaluation prior to elective breast surgery.  She has been under intense stress in the past with her husband's illness has lost about 5 pounds but overall is doing well and has had no angina dyspnea palpitation or syncope.  Her concern is the axis of cardiovascular disease associated breast cancer and  radiation.  Her EKG today surprisingly shows normal conduction does not have left bundle branch block.  She is on appropriate medical therapy including antihypertensive statin and aspirin Past Medical History:  Diagnosis Date  . Cancer ([place]) 01/2017   left breast cancer, basal cell and 1 melanoma  . Complication of anesthesia    heart rate spikeed in the PACU on her 2nd knee surgery  . Environmental allergies   . High cholesterol   . Osteoarthritis   . Personal history of radiation therapy   . PONV (postoperative nausea and vomiting)    with 1st knee surgery had n/v, none since  . Raynaud disease    Raynauds disease-takes procardia  .  Rheumatoid arthritis [facility])     Past Surgical History:  Procedure Laterality Date  . ABDOMINAL HYSTERECTOMY  1972  . APPENDECTOMY  1953  . BREAST LUMPECTOMY Left 2019  . BREAST LUMPECTOMY WITH RADIOACTIVE SEED AND SENTINEL LYMPH NODE BIOPSY Left 04/07/2017   Procedure: BREAST LUMPECTOMY WITH RADIOACTIVE SEED AND SENTINEL LYMPH NODE BIOPSY;  Surgeon: Erroll Luna, MD;  Location: [facility];  Service: General;  Laterality: Left;  . BUNIONECTOMY  1999  . COLONOSCOPY    . EYE SURGERY     bil cataract  . GANGLION CYST EXCISION  1968  . JOINT REPLACEMENT Left 2017    reverse shoulder replacement  . REPLACEMENT TOTAL KNEE  2012, 2013   Right and Left  . SHOULDER ARTHROSCOPY Left   . SYMPATHECTOMY  1970  . TONSILLECTOMY  1949    Current Medications: Current Meds  Medication Sig  . acetaminophen (TYLENOL) 500 MG tablet Take 1,000 mg by mouth every 6 (six) hours as needed for moderate pain or headache.   Marland Kitchen aspirin EC 81 MG tablet Take 81 mg by mouth daily.  Marland Kitchen atorvastatin (LIPITOR) 20 MG tablet Take 20 mg by mouth daily.  . Biotin (SUPER BIOTIN) 5 MG TABS Take 5 mg by mouth daily.   . Black Cohosh 540 MG CAPS Take 540 mg by mouth daily.   . calcitonin, salmon, (MIACALCIN/FORTICAL) 200 UNIT/ACT nasal spray   . calcium-vitamin D (OSCAL WITH D) 500-200 MG-UNIT TABS tablet Take 1 tablet by mouth daily.   . cetirizine (ZYRTEC) 10 MG tablet Take 10 mg by mouth daily.  . Cholecalciferol (VITAMIN D) 2000 units CAPS Take 2,000 Units by mouth daily.   Marland Kitchen co-enzyme Q-10 50 MG capsule Take 50 mg by mouth daily.   Marland Kitchen CRANBERRY PO Take 1 capsule by mouth daily.   Marland Kitchen gabapentin (NEURONTIN) 100 MG capsule   . Glucosamine-Chondroit-Vit C-Mn (GLUCOSAMINE-CHONDROITIN MAX ST) CAPS Take 1 capsule by mouth daily.   Marland Kitchen losartan (COZAAR) 25 MG tablet   . Magnesium 200 MG TABS Take 200 mg by mouth daily.   . Multiple Vitamins-Minerals (CENTRUM ADULTS PO) Take 1 tablet by mouth daily.   Marland Kitchen NIFEdipine (PROCARDIA  XL/ADALAT-CC) 90 MG 24 hr tablet Take 90 mg by mouth daily.   Vladimir Faster Glycol-Propyl Glycol (SYSTANE OP) Place 1 drop into both eyes daily.  . Probiotic Product (PROBIOTIC ADVANCED PO) Take 1 capsule by mouth daily.   Mathis Bud RX 60-1.25 MG CAPS   . tamoxifen (NOLVADEX) 20 MG tablet Take 1 tablet (20 mg total) by mouth daily.  . traMADol (ULTRAM) 50 MG tablet Take 50 mg by mouth 2 (two) times daily as needed for moderate pain.      Allergies:   Fluorouracil, Codeine, Hydrocodone, and Penicillins   Social History  Socioeconomic History  . Marital status: Married    Spouse name: Not on file  . Number of children: Not on file  . Years of education: Not on file  . Highest education level: Not on file  Occupational History  . Not on file  Tobacco Use  . Smoking status: Former Smoker    Quit date: 02/22/1963    Years since quitting: 56.2  . Smokeless tobacco: Never Used  Substance and Sexual Activity  . Alcohol use: Yes    Alcohol/week: 14.0 standard drinks    Types: 14 Glasses of wine per week    Comment: 2 glasses of wine daily.   . Drug use: No  . Sexual activity: Not on file  Other Topics Concern  . Not on file  Social History Narrative  . Not on file   Social Determinants of Health   Financial Resource Strain:   . Difficulty of Paying Living Expenses:   Food Insecurity:   . Worried About Charity fundraiser in the Last Year:   . Arboriculturist in the Last Year:   Transportation Needs:   . Film/video editor (Medical):   Marland Kitchen Lack of Transportation (Non-Medical):   Physical Activity:   . Days of Exercise per Week:   . Minutes of Exercise per Session:   Stress:   . Feeling of Stress :   Social Connections:   . Frequency of Communication with Friends and Family:   . Frequency of Social Gatherings with Friends and Family:   . Attends Religious Services:   . Active Member of Clubs or Organizations:   . Attends Archivist Meetings:   Marland Kitchen Marital Status:       Family History: The patient's family history is negative for Breast cancer. She was adopted.  ROS:   ROS Please see the history of present illness.     All other systems reviewed and are negative.  EKGs/Labs/Other Studies Reviewed:    The following studies were reviewed today:   EKG:  EKG is  ordered today.  The ekg ordered today is personally reviewed and demonstrates this rhythm and is normal  Recent Labs: 04/15/2019: ALT 7; BUN 17; Creatinine 1.29; Hemoglobin 12.3; Platelet Count 309; Potassium 4.8; Sodium 143  Recent Lipid Panel No results found for: CHOL, TRIG, HDL, CHOLHDL, VLDL, LDLCALC, LDLDIRECT  Physical Exam:    VS:  Pulse 89   Temp (!) 96.8 F (36 C)   Ht 5' 3" (1.6 m)   Wt 131 lb (59.4 kg)   LMP  (LMP Unknown)   SpO2 95%   BMI 23.21 kg/m     Wt Readings from Last 3 Encounters:  05/03/19 131 lb (59.4 kg)  04/15/19 140 lb 4.8 oz (63.6 kg)  09/18/18 142 lb 4 oz (64.5 kg)     GEN: Thin anxious she has a slight tremor in her hands well nourished, well developed in no acute distress HEENT: Normal NECK: No JVD; No carotid bruits LYMPHATICS: No lymphadenopathy CARDIAC: RRR, no murmurs, rubs, gallops RESPIRATORY:  Clear to auscultation without rales, wheezing or rhonchi  ABDOMEN: Soft, non-tender, non-distended MUSCULOSKELETAL:  No edema; No deformity  SKIN: Warm and dry NEUROLOGIC:  Alert and oriented x 3 PSYCHIATRIC:  Normal affect     Signed, Shirlee More, MD  05/03/2019 12:01 PM    [facility]

## 2019-05-03 ENCOUNTER — Encounter: Payer: Self-pay | Admitting: Cardiology

## 2019-05-03 ENCOUNTER — Other Ambulatory Visit: Payer: Self-pay

## 2019-05-03 ENCOUNTER — Ambulatory Visit: Payer: Medicare PPO | Admitting: Cardiology

## 2019-05-03 VITALS — HR 89 | Temp 96.8°F | Ht 63.0 in | Wt 131.0 lb

## 2019-05-03 DIAGNOSIS — I10 Essential (primary) hypertension: Secondary | ICD-10-CM | POA: Diagnosis not present

## 2019-05-03 DIAGNOSIS — E782 Mixed hyperlipidemia: Secondary | ICD-10-CM | POA: Diagnosis not present

## 2019-05-03 DIAGNOSIS — C50112 Malignant neoplasm of central portion of left female breast: Secondary | ICD-10-CM

## 2019-05-03 DIAGNOSIS — Z170 Estrogen receptor positive status [ER+]: Secondary | ICD-10-CM

## 2019-05-03 DIAGNOSIS — I447 Left bundle-branch block, unspecified: Secondary | ICD-10-CM | POA: Diagnosis not present

## 2019-05-03 NOTE — Patient Instructions (Signed)
Medication Instructions:  Your physician recommends that you continue on your current medications as directed. Please refer to the Current Medication list given to you today.  *If you need a refill on your cardiac medications before your next appointment, please call your pharmacy*   Lab Work: None ordered  If you have labs (blood work) drawn today and your tests are completely normal, you will receive your results only by: Marland Kitchen MyChart Message (if you have MyChart) OR . A paper copy in the mail If you have any lab test that is abnormal or we need to change your treatment, we will call you to review the results.   Testing/Procedures: Your physician has requested that you have an echocardiogram. Echocardiography is a painless test that uses sound waves to create images of your heart. It provides your doctor with information about the size and shape of your heart and how well your heart's chambers and valves are working. This procedure takes approximately one hour. There are no restrictions for this procedure.     Follow-Up: At [facility], you and your health needs are our priority.  As part of our continuing mission to provide you with exceptional heart care, we have created designated Provider Care Teams.  These Care Teams include your primary Cardiologist (physician) and Advanced Practice Providers (APPs -  Physician Assistants and Nurse Practitioners) who all work together to provide you with the care you need, when you need it.  We recommend signing up for the patient portal called "MyChart".  Sign up information is provided on this After Visit Summary.  MyChart is used to connect with patients for Virtual Visits (Telemedicine).  Patients are able to view lab/test results, encounter notes, upcoming appointments, etc.  Non-urgent messages can be sent to your provider as well.   To learn more about what you can do with MyChart, go to [email or web address].    Your next appointment:     12 month(s)  The format for your next appointment:   In Person  Provider:   Shirlee More, MD   Other Instructions   Echocardiogram An echocardiogram is a procedure that uses painless sound waves (ultrasound) to produce an image of the heart. Images from an echocardiogram can provide important information about:  Signs of coronary artery disease (CAD).  Aneurysm detection. An aneurysm is a weak or damaged part of an artery wall that bulges out from the normal force of blood pumping through the body.  Heart size and shape. Changes in the size or shape of the heart can be associated with certain conditions, including heart failure, aneurysm, and CAD.  Heart muscle function.  Heart valve function.  Signs of a past heart attack.  Fluid buildup around the heart.  Thickening of the heart muscle.  A tumor or infectious growth around the heart valves. Tell a health care provider about:  Any allergies you have.  All medicines you are taking, including vitamins, herbs, eye drops, creams, and over-the-counter medicines.  Any blood disorders you have.  Any surgeries you have had.  Any medical conditions you have.  Whether you are pregnant or may be pregnant. What are the risks? Generally, this is a safe procedure. However, problems may occur, including:  Allergic reaction to dye (contrast) that may be used during the procedure. What happens before the procedure? No specific preparation is needed. You may eat and drink normally. What happens during the procedure?   An IV tube may be inserted into one of your veins.  You may receive contrast through this tube. A contrast is an injection that improves the quality of the pictures from your heart.  A gel will be applied to your chest.  A wand-like tool (transducer) will be moved over your chest. The gel will help to transmit the sound waves from the transducer.  The sound waves will harmlessly bounce off of your heart to  allow the heart images to be captured in real-time motion. The images will be recorded on a computer. The procedure may vary among health care providers and hospitals. What happens after the procedure?  You may return to your normal, everyday life, including diet, activities, and medicines, unless your health care provider tells you not to do that. Summary  An echocardiogram is a procedure that uses painless sound waves (ultrasound) to produce an image of the heart.  Images from an echocardiogram can provide important information about the size and shape of your heart, heart muscle function, heart valve function, and fluid buildup around your heart.  You do not need to do anything to prepare before this procedure. You may eat and drink normally.  After the echocardiogram is completed, you may return to your normal, everyday life, unless your health care provider tells you not to do that. This information is not intended to replace advice given to you by your health care provider. Make sure you discuss any questions you have with your health care provider. Document Revised: 05/31/2018 Document Reviewed: 03/12/2016 Elsevier Patient Education  2020 [employer]vier Inc.   

## 2019-05-08 ENCOUNTER — Other Ambulatory Visit: Payer: Self-pay

## 2019-05-08 ENCOUNTER — Ambulatory Visit (HOSPITAL_BASED_OUTPATIENT_CLINIC_OR_DEPARTMENT_OTHER)
Admission: RE | Admit: 2019-05-08 | Discharge: 2019-05-08 | Disposition: A | Discharge status: Home / Self Care | Payer: Medicare PPO | Source: Ambulatory Visit | Attending: Cardiology | Admitting: Cardiology

## 2019-05-08 DIAGNOSIS — I447 Left bundle-branch block, unspecified: Secondary | ICD-10-CM | POA: Diagnosis present

## 2019-05-08 DIAGNOSIS — I10 Essential (primary) hypertension: Secondary | ICD-10-CM | POA: Diagnosis not present

## 2019-05-08 NOTE — Progress Notes (Signed)
  Echocardiogram 2D Echocardiogram has been performed.  Heidi Lutz 05/08/2019, 3:55 PM

## 2019-07-16 DIAGNOSIS — Z8781 Personal history of (healed) traumatic fracture: Secondary | ICD-10-CM | POA: Insufficient documentation

## 2019-07-16 HISTORY — DX: Personal history of (healed) traumatic fracture: Z87.81

## 2019-07-31 ENCOUNTER — Other Ambulatory Visit: Payer: Self-pay | Admitting: Oncology

## 2019-07-31 DIAGNOSIS — N6320 Unspecified lump in the left breast, unspecified quadrant: Secondary | ICD-10-CM

## 2019-09-02 ENCOUNTER — Other Ambulatory Visit: Payer: Medicare PPO

## 2019-09-26 ENCOUNTER — Other Ambulatory Visit: Payer: Self-pay

## 2019-09-26 ENCOUNTER — Other Ambulatory Visit: Payer: Self-pay | Admitting: Oncology

## 2019-09-26 ENCOUNTER — Ambulatory Visit
Admission: RE | Admit: 2019-09-26 | Discharge: 2019-09-26 | Disposition: A | Discharge status: Home / Self Care | Payer: Medicare PPO | Source: Ambulatory Visit | Attending: Oncology | Admitting: Oncology

## 2019-09-26 DIAGNOSIS — N6320 Unspecified lump in the left breast, unspecified quadrant: Secondary | ICD-10-CM

## 2019-09-26 DIAGNOSIS — Z170 Estrogen receptor positive status [ER+]: Secondary | ICD-10-CM

## 2019-10-31 DIAGNOSIS — M810 Age-related osteoporosis without current pathological fracture: Secondary | ICD-10-CM | POA: Insufficient documentation

## 2019-10-31 HISTORY — DX: Age-related osteoporosis without current pathological fracture: M81.0

## 2019-11-04 ENCOUNTER — Other Ambulatory Visit: Payer: Medicare PPO

## 2019-11-10 ENCOUNTER — Other Ambulatory Visit: Payer: Self-pay

## 2019-11-10 ENCOUNTER — Ambulatory Visit
Admission: RE | Admit: 2019-11-10 | Discharge: 2019-11-10 | Disposition: A | Discharge status: Home / Self Care | Payer: Medicare PPO | Source: Ambulatory Visit | Attending: Oncology | Admitting: Oncology

## 2019-11-10 DIAGNOSIS — Z170 Estrogen receptor positive status [ER+]: Secondary | ICD-10-CM

## 2019-11-10 MED ORDER — GADOBUTROL 1 MMOL/ML IV SOLN
12.0000 mL | Freq: Once | INTRAVENOUS | Status: AC | PRN
  Administered 2019-11-10: 12 mL via INTRAVENOUS

## 2019-11-15 ENCOUNTER — Other Ambulatory Visit: Payer: Medicare PPO

## 2019-11-18 ENCOUNTER — Other Ambulatory Visit: Payer: Self-pay | Admitting: Oncology

## 2019-12-31 ENCOUNTER — Encounter: Payer: Self-pay | Admitting: Physical Medicine and Rehabilitation

## 2020-02-07 ENCOUNTER — Other Ambulatory Visit: Payer: Self-pay

## 2020-02-07 ENCOUNTER — Encounter: Payer: Self-pay | Admitting: Physical Medicine and Rehabilitation

## 2020-02-07 ENCOUNTER — Encounter: Payer: Medicare PPO | Attending: Physical Medicine and Rehabilitation | Admitting: Physical Medicine and Rehabilitation

## 2020-02-07 VITALS — BP 118/74 | HR 85 | Temp 98.4°F | Ht 63.0 in | Wt 145.4 lb

## 2020-02-07 DIAGNOSIS — M48061 Spinal stenosis, lumbar region without neurogenic claudication: Secondary | ICD-10-CM | POA: Diagnosis not present

## 2020-02-07 MED ORDER — LIDOCAINE 5 % EX PTCH: 1.0000 | MEDICATED_PATCH | CUTANEOUS | 0 refills | Status: DC

## 2020-02-07 MED ORDER — PREGABALIN 150 MG PO CAPS: 150.0000 mg | ORAL_CAPSULE | Freq: Two times a day (BID) | ORAL | 1 refills | Status: DC

## 2020-02-07 NOTE — Patient Instructions (Addendum)
Blue emu oil  Sports orthopedic: Dr. Legrand Como [facility]  [email or web address]   -Discussed following foods that may reduce pain: 1) Ginger 2) Blueberries 3) Salmon 4) Pumpkin seeds 5) dark chocolate 6) turmeric 7) tart cherries 8) virgin olive oil 9) chilli peppers 10) mint 11) red wine  Turmeric to reduce inflammation--can be used in cooking or taken as a supplement.  Benefits of turmeric:  -Highly anti-inflammatory  -Increases antioxidants  -Improves memory, attention, brain disease  -Lowers risk of heart disease  -May help prevent cancer  -Decreases pain  -Alleviates depression  -Delays aging and decreases risk of chronic disease  -Consume with black pepper to increase absorption    Turmeric Milk Recipe:  1 cup milk  1 tsp turmeric  1 tsp cinnamon  1 tsp grated ginger (optional)  Black pepper (boosts the anti-inflammatory properties of turmeric).  1 tsp honey

## 2020-02-07 NOTE — Progress Notes (Signed)
Subjective:    Patient ID: Heidi Lutz, female    DOB: January 06, 1941, 79 y.o.   MRN: [id number]  HPI  Heidi Lutz is an 79 year old woman who presents to establish care for lower back pain.  She has had this pain for the last 2 years and it has gradually gotten worse.   The pain radiates into her legs at times, but her back pain is much more painful than the pain in her legs.  She has tied Tylenol, Ibuprofen, Lyrica 100mg  BID, and spinal injections and ablations.   Her husband accompanies her today. They used to like to walk three miles together but have not for the past year because of her pain. She misses the joy of walking.   Pain Inventory Average Pain 10 Pain Right Now 7 My pain is constant  In the last 24 hours, has pain interfered with the following? General activity 10 Relation with others 5 Enjoyment of life 10 What TIME of day is your pain at its worst? morning , daytime, evening and night Sleep (in general) Fair  Pain is worse with: walking, bending, standing and some activites Pain improves with: rest Relief from Meds: 2  walk without assistance how many minutes can you walk? 5 ability to climb steps?  yes do you drive?  yes  retired  bladder control problems tremor trouble walking  New pt  New pt    Family History  Adopted: Yes  Problem Relation Age of Onset   Breast cancer Neg Hx    Social History   Socioeconomic History   Marital status: Married    Spouse name: Not on file   Number of children: Not on file   Years of education: Not on file   Highest education level: Not on file  Occupational History   Not on file  Tobacco Use   Smoking status: Former Smoker    Quit date: 02/22/1963    Years since quitting: 56.9   Smokeless tobacco: Never Used  Vaping Use   Vaping Use: Never used  Substance and Sexual Activity   Alcohol use: Yes    Alcohol/week: 14.0 standard drinks    Types: 14 Glasses of wine per week    Comment:  2 glasses of wine daily.    Drug use: No   Sexual activity: Not on file  Other Topics Concern   Not on file  Social History Narrative   Not on file   Social Determinants of Health   Financial Resource Strain: Not on file  Food Insecurity: Not on file  Transportation Needs: Not on file  Physical Activity: Not on file  Stress: Not on file  Social Connections: Not on file   Past Surgical History:  Procedure Laterality Date   [place] LUMPECTOMY Left 2019   BREAST LUMPECTOMY WITH RADIOACTIVE SEED AND SENTINEL LYMPH NODE BIOPSY Left 04/07/2017   Procedure: BREAST LUMPECTOMY WITH RADIOACTIVE SEED AND SENTINEL LYMPH NODE BIOPSY;  Surgeon: Erroll Luna, MD;  Location: [facility];  Service: General;  Laterality: Left;   BUNIONECTOMY  1999   COLONOSCOPY     EYE SURGERY     bil cataract   GANGLION CYST EXCISION  1968   JOINT REPLACEMENT Left 2017    reverse shoulder replacement   REPLACEMENT TOTAL KNEE  2012, 2013   Right and Left   SHOULDER ARTHROSCOPY Left    [place]  1949   Past Medical History:  Diagnosis Date   Cancer ([place]) 01/2017   left breast cancer, basal cell and 1 melanoma   Complication of anesthesia    heart rate spikeed in the PACU on her 2nd knee surgery   Environmental allergies    High cholesterol    Osteoarthritis    Personal history of radiation therapy    PONV (postoperative nausea and vomiting)    with 1st knee surgery had n/v, none since   Raynaud disease    Raynauds disease-takes procardia   Rheumatoid arthritis (HCC)    BP 118/74    Pulse 85    Temp 98.4 F (36.9 C)    Ht 5\' 3"  (1.6 m)    Wt 145 lb 6.4 oz (66 kg)    LMP  (LMP Unknown)    SpO2 94%    BMI 25.76 kg/m   Opioid Risk Score:   Fall Risk Score:  `1  Depression screen PHQ 2/9  Depression screen [facility] 2/9 07/06/2017 04/18/2017 03/16/2017  Decreased Interest 0 0 0  Down, Depressed, Hopeless  0 0 0  PHQ - 2 Score 0 0 0    Review of Systems  Musculoskeletal: Positive for back pain.  All other systems reviewed and are negative.      Objective:   Physical Exam Gen: no distress, normal appearing HEENT: oral mucosa pink and moist, NCAT Cardio: Reg rate Chest: normal effort, normal rate of breathing Abd: soft, non-distended Ext: no edema Skin: intact Neuro: Alert and oriented x3. Musculoskeletal: Tender to palpation in lower back, pain is worse with flexion than extension.  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 -Discussed current symptoms of pain and history of pain.  -Discussed benefits of exercise in reducing pain. -Prescribed lidocaine patch -Increase Lyrica to 150mg  BID -Discussed following foods that may reduce pain: 1) Ginger 2) Blueberries 3) Salmon 4) Pumpkin seeds 5) dark chocolate 6) turmeric 7) tart cherries 8) virgin olive oil 9) chilli peppers 10) mint  Turmeric to reduce inflammation--can be used in cooking or taken as a supplement.  Discussed Benefits of turmeric:  -Highly anti-inflammatory  -Increases antioxidants  -Improves memory, attention, brain disease  -Lowers risk of heart disease  -May help prevent cancer  -Decreases pain  -Alleviates depression  -Delays aging and decreases risk of chronic disease  -Consume with black pepper to increase absorption    Turmeric Milk Recipe:  1 cup milk  1 tsp turmeric  1 tsp cinnamon  1 tsp grated ginger (optional)  Black pepper (boosts the anti-inflammatory properties of turmeric).  1 tsp honey

## 2020-02-17 MED ORDER — LIDOCAINE 5 % EX PTCH: 1.0000 | MEDICATED_PATCH | CUTANEOUS | 0 refills | Status: DC

## 2020-02-18 ENCOUNTER — Other Ambulatory Visit: Payer: Self-pay | Admitting: Oncology

## 2020-02-18 DIAGNOSIS — Z853 Personal history of malignant neoplasm of breast: Secondary | ICD-10-CM

## 2020-02-22 DIAGNOSIS — N3281 Overactive bladder: Secondary | ICD-10-CM | POA: Insufficient documentation

## 2020-02-22 HISTORY — DX: Overactive bladder: N32.81

## 2020-03-20 ENCOUNTER — Encounter: Payer: Self-pay | Admitting: Physical Medicine and Rehabilitation

## 2020-03-20 ENCOUNTER — Encounter: Payer: Medicare PPO | Attending: Physical Medicine and Rehabilitation | Admitting: Physical Medicine and Rehabilitation

## 2020-03-20 ENCOUNTER — Other Ambulatory Visit: Payer: Self-pay

## 2020-03-20 VITALS — BP 152/79 | HR 82 | Temp 97.2°F | Ht 63.0 in | Wt 146.4 lb

## 2020-03-20 DIAGNOSIS — M48061 Spinal stenosis, lumbar region without neurogenic claudication: Secondary | ICD-10-CM | POA: Diagnosis not present

## 2020-03-20 DIAGNOSIS — N179 Acute kidney failure, unspecified: Secondary | ICD-10-CM | POA: Insufficient documentation

## 2020-03-20 DIAGNOSIS — M47819 Spondylosis without myelopathy or radiculopathy, site unspecified: Secondary | ICD-10-CM | POA: Diagnosis not present

## 2020-03-20 MED ORDER — MELOXICAM 7.5 MG PO TABS: 7.5000 mg | ORAL_TABLET | Freq: Every day | ORAL | 1 refills | Status: DC

## 2020-03-20 MED ORDER — LIDOCAINE 5 % EX PTCH: 2.0000 | MEDICATED_PATCH | CUTANEOUS | 1 refills | Status: DC

## 2020-03-20 NOTE — Progress Notes (Signed)
Subjective:    Patient ID: Heidi Lutz, female    DOB: [birth year], 80 y.o.   MRN: [id number]  HPI  Heidi Lutz is an 80 year old woman who presents for follow-up of lower back pain.  1) Lower back pain:  -She has had this pain for the last 2 years and it has gradually gotten worse.   -The pain radiates into her legs at times, but her back pain is much more painful than the pain in her legs.  -She has tied Tylenol, Ibuprofen, Lyrica 100mg  BID, and spinal injections and ablations. The injections   -She took herself off the Lyrica, it was confusing her.   -She is currently using Lidocaine patch and this helps.   -Her pain is worse on the right side, sometimes on the left.   -Her husband accompanies her today. They used to like to walk three miles together but have not for the past year because of her pain. She misses the joy of walking.   -Discussed Sprint PNS system as an option of pain treatment via neuromodulation. Provided following link for patient to learn more about the system: [email or web address].spr[employer].[email or web address]   Pain Inventory Average Pain 9 Pain Right Now 6 My pain is constant and stabbing  In the last 24 hours, has pain interfered with the following? General activity 10 Relation with others 5 Enjoyment of life 10 What TIME of day is your pain at its worst? morning , daytime and evening Sleep (in general) Good  Pain is worse with: walking, bending, standing and some activites Pain improves with: rest Relief from Meds: 2      Family History  Adopted: Yes  Problem Relation Age of Onset  . Breast cancer Neg Hx    Social History   Socioeconomic History  . Marital status: Married    Spouse name: Not on file  . Number of children: Not on file  . Years of education: Not on file  . Highest education level: Not on file  Occupational History  . Not on file  Tobacco Use  . Smoking status: Former Smoker    Quit date: 02/22/1963    Years since quitting:  61.1  . Smokeless tobacco: Never Used  Vaping Use  . Vaping Use: Never used  Substance and Sexual Activity  . Alcohol use: Yes    Alcohol/week: 14.0 standard drinks    Types: 14 Glasses of wine per week    Comment: 2 glasses of wine daily.   . Drug use: No  . Sexual activity: Not on file  Other Topics Concern  . Not on file  Social History Narrative  . Not on file   Social Determinants of Health   Financial Resource Strain: Not on file  Food Insecurity: Not on file  Transportation Needs: Not on file  Physical Activity: Not on file  Stress: Not on file  Social Connections: Not on file   Past Surgical History:  Procedure Laterality Date  . ABDOMINAL HYSTERECTOMY  1972  . APPENDECTOMY  1953  . BREAST LUMPECTOMY Left 2019  . BREAST LUMPECTOMY WITH RADIOACTIVE SEED AND SENTINEL LYMPH NODE BIOPSY Left 04/07/2017   Procedure: BREAST LUMPECTOMY WITH RADIOACTIVE SEED AND SENTINEL LYMPH NODE BIOPSY;  Surgeon: Erroll Luna, MD;  Location: [facility];  Service: General;  Laterality: Left;  . BUNIONECTOMY  1999  . COLONOSCOPY    . EYE SURGERY     bil cataract  . GANGLION CYST EXCISION  1968  . JOINT REPLACEMENT  Left 2017    reverse shoulder replacement  . REPLACEMENT TOTAL KNEE  2012, 2013   Right and Left  . SHOULDER ARTHROSCOPY Left   . SYMPATHECTOMY  1970  . TONSILLECTOMY  1949   Past Medical History:  Diagnosis Date  . Cancer ([place]) 01/2017   left breast cancer, basal cell and 1 melanoma  . Complication of anesthesia    heart rate spikeed in the PACU on her 2nd knee surgery  . Environmental allergies   . High cholesterol   . Osteoarthritis   . Personal history of radiation therapy   . PONV (postoperative nausea and vomiting)    with 1st knee surgery had n/v, none since  . Raynaud disease    Raynauds disease-takes procardia  . Rheumatoid arthritis (HCC)    LMP  (LMP Unknown)   Opioid Risk Score:   Fall Risk Score:  `1  Depression screen PHQ 2/9  Depression screen  [facility] 2/9 07/06/2017 04/18/2017 03/16/2017  Decreased Interest 0 0 0  Down, Depressed, Hopeless 0 0 0  PHQ - 2 Score 0 0 0    Review of Systems  Musculoskeletal: Positive for back pain.  All other systems reviewed and are negative.      Objective:   Physical Exam Gen: no distress, normal appearing HEENT: oral mucosa pink and moist, NCAT Cardio: Reg rate Chest: normal effort, normal rate of breathing Abd: soft, non-distended Ext: no edema Psych: pleasant, normal affect Skin: intact Musculoskeletal: Tender to palpation in lower back, pain is worse with extension than flexion, +Kemp's sign right greater than left. Negative Slump test bilaterally.  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 -Discussed current symptoms of pain and history of pain.  -Discussed benefits of exercise in reducing pain. -Prescribed lidocaine patch -Stop Lyrica -Start Meloxicam 7.5mg  daily PRN. Discussed side effects. Repeat Cr next visit -Discussed following foods that may reduce pain: 1) Ginger 2) Blueberries 3) Salmon 4) Pumpkin seeds 5) dark chocolate 6) turmeric 7) tart cherries 8) virgin olive oil 9) chilli peppers 10) mint  Turmeric to reduce inflammation-can be used in cooking or taken as a supplement.  Benefits of turmeric:  -Highly anti-inflammatory  -Increases antioxidants  -Improves memory, attention, brain disease  -Lowers risk of heart disease  -May help prevent cancer  -Decreases pain  -Alleviates depression  -Delays aging and decreases risk of chronic disease  -Consume with black pepper to increase absorption    Turmeric Milk Recipe:  1 cup milk  1 tsp turmeric  1 tsp cinnamon  1 tsp grated ginger (optional)  Black pepper (boosts the anti-inflammatory properties of turmeric).  1 tsp honey   2) AKI: Cr elevated on last labs. Repeat next visit given  starting Meloxicam.

## 2020-03-20 NOTE — Patient Instructions (Addendum)
-  Discussed Sprint PNS system as an option of pain treatment via neuromodulation. Provided following link for patient to learn more about the system: [email or web address].[email or web address]    -Discussed following foods that may reduce pain: 1) Ginger 2) Blueberries 3) Salmon 4) Pumpkin seeds 5) dark chocolate 6) turmeric 7) tart cherries 8) virgin olive oil 9) chilli peppers 10) mint

## 2020-03-24 ENCOUNTER — Encounter: Payer: Self-pay | Admitting: Oncology

## 2020-03-25 ENCOUNTER — Other Ambulatory Visit: Payer: Self-pay | Admitting: Oncology

## 2020-04-13 ENCOUNTER — Other Ambulatory Visit: Payer: Self-pay | Admitting: *Deleted

## 2020-04-13 DIAGNOSIS — Z170 Estrogen receptor positive status [ER+]: Secondary | ICD-10-CM

## 2020-04-13 DIAGNOSIS — C50112 Malignant neoplasm of central portion of left female breast: Secondary | ICD-10-CM

## 2020-04-14 NOTE — Progress Notes (Signed)
[facility]  [phone] [phone]   ID: Heidi Lutz DOB: October 26, 1940  MR#: [id number]  EAT#:[id number]  Patient Care Team: Dois Davenport, MD as PCP - General (Family Medicine) Chonda Baney, Valentino Hue, MD as Consulting Physician (Oncology) Heidi Bouillon, MD as Consulting Physician (General Surgery) OTHER MD:   CHIEF COMPLAINT: Estrogen receptor positive breast cancer  CURRENT TREATMENT: Tamoxifen   INTERVAL HISTORY: Heidi Lutz returns today for follow-up and treatment of her estrogen receptor positive breast cancer.  She is accompanied by her husband Heidi Lutz  She continues on tamoxifen.  She has no problems with hot flashes or vaginal wetness.  She tolerates this medication with no side effects that she is aware of  Since her last visit, she underwent left diagnostic mammography with tomography at [facility] on 09/26/2019 showing: breast density category C; increased difficulty compressing left breast due to increasing hardness; increased skin thickening; no sonographically-identified masses; left-sided seroma.  She underwent breast MRI on 11/10/2019 showing: breast composition C; no evidence of malignancy in either breast.  She is scheduled for annual bilateral mammography on 05/12/2020.   REVIEW OF SYSTEMS: Heidi Lutz and Heidi Lutz were attending the [facility] lecture at the [facility] and she fell and broke her ankle.  She has a boot in place.  She is otherwise progressing well.  For exercise she is using the water aerobics at [facility] burn Monday Wednesday and Friday.  A detailed review of systems today was otherwise noncontributory  COVID 19 VACCINATION STATUS: vaccinated x2 with booster October 2021    HISTORY OF CURRENT ILLNESS: From the original intake note:  Ximena Todaro had routine screening mammography on 02/27/2017 showing a possible asymmetry in the left breast. She underwent unilateral diagnostic mammography with tomography and left  breast ultrasonography at [facility] on 03/03/2017 showing: breast density category C. Small left breast mass in the 3:30 o'clock lower outer position 5 cm from the nipple measuring 0.8 x 0.5 x 0.7 cm is suspicious for breast malignancy. The left axilla is negative for lymphadenopathy.   Accordingly on 03/08/2016 she proceeded to biopsy of the left breast mass in question. The pathology from this procedure showed ([id number]): invasive mammary carcinoma. 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 Prognostic indicators significant for: estrogen receptor, 100% positive and progesterone receptor, 70% positive, both with strong staining intensity. Proliferation marker Ki67 at 2%. HER2 not amplified with ratio HER2/CEP17 signals 1.39 and average HER2 copies per cell 1.95.  The patient's subsequent history is as detailed below.   PAST MEDICAL HISTORY: Past Medical History:  Diagnosis Date  . Cancer (HCC) 01/2017   left breast cancer, basal cell and 1 melanoma  . Complication of anesthesia    heart rate spikeed in the PACU on her 2nd knee surgery  . Environmental allergies   . High cholesterol   . OAB (overactive bladder) 02/2020  . Osteoarthritis   . Personal history of radiation therapy   . PONV (postoperative nausea and vomiting)    with 1st knee surgery had n/v, none since  . Raynaud disease    Raynauds disease-takes procardia  . Rheumatoid arthritis (HCC)     PAST SURGICAL HISTORY: Past Surgical History:  Procedure Laterality Date  . ABDOMINAL HYSTERECTOMY  1972  . APPENDECTOMY  1953  . BREAST LUMPECTOMY Left 2019  . BREAST LUMPECTOMY WITH RADIOACTIVE SEED AND SENTINEL LYMPH NODE BIOPSY Left 04/07/2017   Procedure: BREAST LUMPECTOMY WITH RADIOACTIVE SEED AND SENTINEL LYMPH NODE BIOPSY;  Surgeon: Heidi Lutz,  Heidi Moores, MD;  Location: [facility];  Service: General;  Laterality: Left;  . BUNIONECTOMY  1999  . COLONOSCOPY    . EYE SURGERY     bil cataract  . GANGLION CYST  EXCISION  1968  . JOINT REPLACEMENT Left 2017    reverse shoulder replacement  . REPLACEMENT TOTAL KNEE  2012, 2013   Right and Left  . SHOULDER ARTHROSCOPY Left   . SYMPATHECTOMY  1970  . TONSILLECTOMY  1949    FAMILY HISTORY: Family History  Adopted: Yes  Problem Relation Age of Onset  . Breast cancer Neg Hx   The patient is adopted and has no information regarding her biologic family.   GYNECOLOGIC HISTORY:  No LMP recorded (lmp unknown). Patient has had a hysterectomy. Menarche: X years old Age at first live birth: 80 years old [place] P 2 LMP status post hysterectomy at age 74 Contraceptive: n/a HRT yes, 5 years, including progesterone  SO?  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  Daughter Heidi Lutz is a Scientist, research (physical sciences) in [employer].  Daughter Heidi Lutz is an Location manager in [place].  The patient has 1 biologic grandson and 3 step grandchildren. She is a [place]    ADVANCED DIRECTIVES: In the absence of any documentation to the contrary, the patient's spouse is their HCPOA.    HEALTH MAINTENANCE: Social History   Tobacco Use  . Smoking status: Former Smoker    Quit date: 02/22/1963    Years since quitting: 50.1  . Smokeless tobacco: Never Used  Vaping Use  . Vaping Use: Never used  Substance Use Topics  . Alcohol use: Yes    Alcohol/week: 14.0 standard drinks    Types: 14 Glasses of wine per week    Comment: 2 glasses of wine daily.   . Drug use: No    Colonoscopy: 2014  PAP: Status post hysterectomy  Bone density: 02/27/2017 showed a T scored of -1.8 osteopenia    Allergies  Allergen Reactions  . Fluorouracil Hives  . Codeine Nausea And Vomiting  . Hydrocodone Nausea And Vomiting  . Penicillins Swelling, Rash and Other (See Comments)    Pt states allergic to all "cillin" drugs. Joint pain. Has patient had a PCN reaction causing immediate rash, facial/tongue/throat  swelling, SOB or lightheadedness with hypotension: Yes Has patient had a PCN reaction causing severe rash involving mucus membranes or skin necrosis: No Has patient had a PCN reaction that required hospitalization: No Has patient had a PCN reaction occurring within the last 10 years: No If all of the above answers are "NO", then may proceed with Cephalosporin use.     Current Outpatient Medications  Medication Sig Dispense Refill  . acetaminophen (TYLENOL) 500 MG tablet Take 1,000 mg by mouth every 6 (six) hours as needed for moderate pain or headache.     Marland Kitchen atorvastatin (LIPITOR) 20 MG tablet Take 20 mg by mouth daily.    . Black Cohosh 540 MG CAPS Take 540 mg by mouth daily.     . calcium-vitamin D (OSCAL WITH D) 500-200 MG-UNIT TABS tablet Take 1 tablet by mouth daily.     . cetirizine (ZYRTEC) 10 MG tablet Take 10 mg by mouth daily.    . Cholecalciferol (VITAMIN D) 2000 units CAPS Take 2,000 Units by mouth daily.     . Cholecalciferol 125 MCG (5000 UT) TABS Take by mouth.    Marland Kitchen  CRANBERRY PO Take 1 capsule by mouth daily.     Marland Kitchen denosumab (PROLIA) 60 MG/ML SOSY injection Inject into the skin.    . Glucosamine-Chondroit-Vit C-Mn (GLUCOSAMINE-CHONDROITIN MAX ST) CAPS Take 1 capsule by mouth daily.     Marland Kitchen lidocaine (LIDODERM) 5 % Place 2 patches onto the skin daily. Remove & Discard patch within 12 hours or as directed by MD 60 patch 1  . losartan (COZAAR) 25 MG tablet     . Magnesium 200 MG TABS Take 200 mg by mouth daily.     . meloxicam (MOBIC) 7.5 MG tablet Take 1 tablet (7.5 mg total) by mouth daily. 30 tablet 1  . Multiple Vitamins-Minerals (CENTRUM ADULTS PO) Take 1 tablet by mouth daily.     Marland Kitchen NIFEdipine (PROCARDIA XL/ADALAT-CC) 90 MG 24 hr tablet Take 90 mg by mouth daily.     Heidi Lutz Lutz-Propyl Lutz (SYSTANE OP) Place 1 drop into both eyes daily.    . pregabalin (LYRICA) 150 MG capsule Take 1 capsule (150 mg total) by mouth 2 (two) times daily. 60 capsule 1  . pregabalin  (LYRICA) 50 MG capsule     . Probiotic Product (PROBIOTIC ADVANCED PO) Take 1 capsule by mouth daily.     . solifenacin (VESICARE) 10 MG tablet Take by mouth.    . tamoxifen (NOLVADEX) 20 MG tablet TAKE 1 TABLET BY MOUTH  DAILY 90 tablet 4  . trimethobenzamide (TIGAN) 300 MG capsule Take 300 mg by mouth 3 (three) times daily.     No current facility-administered medications for this visit.    OBJECTIVE: White woman examined in a wheelchair Vitals:   04/15/20 1207  BP: 115/75  Pulse: 76  Resp: 18  Temp: (!) 97.5 F (36.4 C)  SpO2: 98%     Body mass index is 24.27 kg/m.   Wt Readings from Last 3 Encounters:  04/15/20 137 lb (62.1 kg)  03/20/20 146 lb 6.4 oz (66.4 kg)  02/07/20 145 lb 6.4 oz (66 kg)      ECOG FS:1 - Symptomatic but completely ambulatory   Sclerae unicteric, EOMs intact Wearing a mask No cervical or supraclavicular adenopathy Lungs no rales or rhonchi Heart regular rate and rhythm Abd soft, nontender, positive bowel sounds MSK no focal spinal tenderness, no upper extremity lymphedema; left ankle in boot Neuro: nonfocal, well oriented, appropriate affect Breasts: The right breast is unremarkable.  The left breast is exceedingly firm.  There are no skin changes of concern.  Both axillae are benign.   LAB RESULTS:  CMP     Component Value Date/Time   NA 138 04/15/2020 1111   K 4.3 04/15/2020 1111   CL 102 04/15/2020 1111   CO2 25 04/15/2020 1111   GLUCOSE 100 (H) 04/15/2020 1111   BUN 25 (H) 04/15/2020 1111   CREATININE 1.46 (H) 04/15/2020 1111   CALCIUM 9.8 04/15/2020 1111   PROT 7.2 04/15/2020 1111   ALBUMIN 3.8 04/15/2020 1111   AST 18 04/15/2020 1111   ALT 6 04/15/2020 1111   ALKPHOS 47 04/15/2020 1111   BILITOT 0.6 04/15/2020 1111   GFRNONAA 36 (L) 04/15/2020 1111   GFRAA 45 (L) 04/15/2019 1105    No results found for: TOTALPROTELP, ALBUMINELP, A1GS, A2GS, BETS, BETA2SER, GAMS, MSPIKE, SPEI  No results found for: KPAFRELGTCHN, LAMBDASER,  KAPLAMBRATIO  Lab Results  Component Value Date   WBC 6.1 04/15/2020   NEUTROABS 3.4 04/15/2020   HGB 12.4 04/15/2020   HCT 36.5 04/15/2020   MCV  97.3 04/15/2020   PLT 210 04/15/2020   No results found for: LABCA2  No components found for: MGQQPY195  No results for input(s): INR in the last 168 hours.  No results found for: LABCA2  No results found for: KDT267  No results found for: TIW580  No results found for: DXI338  No results found for: CA2729  No components found for: HGQUANT  No results found for: CEA1 / No results found for: CEA1   No results found for: AFPTUMOR  No results found for: CHROMOGRNA  No results found for: HGBA, HGBA2QUANT, HGBFQUANT, HGBSQUAN (Hemoglobinopathy evaluation)   No results found for: LDH  No results found for: IRON, TIBC, IRONPCTSAT (Iron and TIBC)  No results found for: FERRITIN  Urinalysis No results found for: COLORURINE, APPEARANCEUR, LABSPEC, PHURINE, GLUCOSEU, HGBUR, BILIRUBINUR, KETONESUR, PROTEINUR, UROBILINOGEN, NITRITE, LEUKOCYTESUR   STUDIES: No results found.   ELIGIBLE FOR AVAILABLE RESEARCH PROTOCOL: no   ASSESSMENT: 80 y.o. [place], [place] woman s/p central left breast biopsy 03/08/2017 for a clinical T1b N0, stage IA invasive ductal carcinoma, grade 1, estrogen and progesterone receptor positive, HER-2 not amplified, with an MIB-1 of 2%  (1)  Status post left breast lumpectomy with sentinel lymph node sampling 04/07/2017 showed a  pT1b pN0, stage IA invasive ductal carcinoma, grade II, with negative margins  (a) total 1 lymph node removed from the axilla  (2) adjuvant radiation 05/10/2017 - 06/06/2017 Site/dose: Breast_Lt/ 42.5 Gy delivered in 17 fractions of 2.5 Gy Breast_Lt_Bst/ 7.5 Gy delivered in 3 fractions of 2.5 Gy  (3) tamoxifen started 03/17/2017  (a) bone density at the breast center 02/28/2017 showed a T score of -1.8  (b) status post hysterectomy  (c) status post bilateral cataract  surgery  (d) history of 5 years of hormone replacement with no clotting complications  (4) discussed genetics testing in this adopted patient: Opted against   PLAN: Heidi Lutz is now 3 years out from definitive surgery for her breast cancer with no evidence of disease recurrence.  This is very febrile.  She is tolerating tamoxifen with no side effects and the plan is to continue that for 5 years.  Her left breast is so firm they can really not do mammography on it.  They tried to do an ultrasound last year but that was not informative.  She eventually required an MRI.  I think she needs to move to breast MRI yearly and I have put that order in for her.  If there is excessive out-of-pocket cost or other complications that would let me know  I commended her exercise program.  She will see [place] again in 1 year  Total encounter time 20 minutes.  Draven Natter, [name], MD  04/15/20 12:16 PM Medical Oncology and Hematology [facility] [place], [place] [address] Tel. [phone]    Fax. [phone]   I, Wilburn Mylar, am acting as scribe for Dr. [name]. Cailee Blanke.  I, Lurline Del MD, have reviewed the above documentation for accuracy and completeness, and I agree with the above.   *Total Encounter Time as defined by the Centers for Medicare and Medicaid Services includes, in addition to the face-to-face time of a patient visit (documented in the note above) non-face-to-face time: obtaining and reviewing outside history, ordering and reviewing medications, tests or procedures, care coordination (communications with other health care professionals or caregivers) and documentation in the medical record.

## 2020-04-15 ENCOUNTER — Inpatient Hospital Stay: Payer: Medicare PPO | Admitting: Oncology

## 2020-04-15 ENCOUNTER — Other Ambulatory Visit: Payer: Self-pay

## 2020-04-15 ENCOUNTER — Inpatient Hospital Stay: Payer: Medicare PPO | Attending: Oncology

## 2020-04-15 VITALS — BP 115/75 | HR 76 | Temp 97.5°F | Resp 18 | Ht 63.0 in | Wt 137.0 lb

## 2020-04-15 DIAGNOSIS — M8580 Other specified disorders of bone density and structure, unspecified site: Secondary | ICD-10-CM | POA: Diagnosis not present

## 2020-04-15 DIAGNOSIS — Z923 Personal history of irradiation: Secondary | ICD-10-CM | POA: Insufficient documentation

## 2020-04-15 DIAGNOSIS — Z170 Estrogen receptor positive status [ER+]: Secondary | ICD-10-CM | POA: Diagnosis not present

## 2020-04-15 DIAGNOSIS — Z87891 Personal history of nicotine dependence: Secondary | ICD-10-CM | POA: Diagnosis not present

## 2020-04-15 DIAGNOSIS — C50112 Malignant neoplasm of central portion of left female breast: Secondary | ICD-10-CM | POA: Diagnosis present

## 2020-04-15 LAB — CBC WITH DIFFERENTIAL (CANCER CENTER ONLY)
Abs Immature Granulocytes: 0.02 10*3/uL (ref 0.00–0.07)
Basophils Absolute: 0.1 10*3/uL (ref 0.0–0.1)
Basophils Relative: 1 %
Eosinophils Absolute: 0.2 10*3/uL (ref 0.0–0.5)
Eosinophils Relative: 4 %
HCT: 36.5 % (ref 36.0–46.0)
Hemoglobin: 12.4 g/dL (ref 12.0–15.0)
Immature Granulocytes: 0 %
Lymphocytes Relative: 29 %
Lymphs Abs: 1.7 10*3/uL (ref 0.7–4.0)
MCH: 33.1 pg (ref 26.0–34.0)
MCHC: 34 g/dL (ref 30.0–36.0)
MCV: 97.3 fL (ref 80.0–100.0)
Monocytes Absolute: 0.7 10*3/uL (ref 0.1–1.0)
Monocytes Relative: 12 %
Neutro Abs: 3.4 10*3/uL (ref 1.7–7.7)
Neutrophils Relative %: 54 %
Platelet Count: 210 10*3/uL (ref 150–400)
RBC: 3.75 MIL/uL — ABNORMAL LOW (ref 3.87–5.11)
RDW: 13.5 % (ref 11.5–15.5)
WBC Count: 6.1 10*3/uL (ref 4.0–10.5)
nRBC: 0 % (ref 0.0–0.2)

## 2020-04-15 LAB — CMP (CANCER CENTER ONLY)
ALT: 6 U/L (ref 0–44)
AST: 18 U/L (ref 15–41)
Albumin: 3.8 g/dL (ref 3.5–5.0)
Alkaline Phosphatase: 47 U/L (ref 38–126)
Anion gap: 11 (ref 5–15)
BUN: 25 mg/dL — ABNORMAL HIGH (ref 8–23)
CO2: 25 mmol/L (ref 22–32)
Calcium: 9.8 mg/dL (ref 8.9–10.3)
Chloride: 102 mmol/L (ref 98–111)
Creatinine: 1.46 mg/dL — ABNORMAL HIGH (ref 0.44–1.00)
GFR, Estimated: 36 mL/min — ABNORMAL LOW (ref >60)
Glucose, Bld: 100 mg/dL — ABNORMAL HIGH (ref 70–99)
Potassium: 4.3 mmol/L (ref 3.5–5.1)
Sodium: 138 mmol/L (ref 135–145)
Total Bilirubin: 0.6 mg/dL (ref 0.3–1.2)
Total Protein: 7.2 g/dL (ref 6.5–8.1)

## 2020-04-15 MED ORDER — TAMOXIFEN CITRATE 20 MG PO TABS: 20.0000 mg | ORAL_TABLET | Freq: Every day | ORAL | 4 refills | Status: DC

## 2020-04-16 ENCOUNTER — Telehealth: Payer: Self-pay | Admitting: Oncology

## 2020-04-16 NOTE — Telephone Encounter (Signed)
Scheduled appts per 2/23 los. Pt confirmed appt date and time.  

## 2020-05-08 DIAGNOSIS — Z923 Personal history of irradiation: Secondary | ICD-10-CM | POA: Insufficient documentation

## 2020-05-08 DIAGNOSIS — Z9889 Other specified postprocedural states: Secondary | ICD-10-CM | POA: Insufficient documentation

## 2020-05-08 DIAGNOSIS — M1990 Unspecified osteoarthritis, unspecified site: Secondary | ICD-10-CM | POA: Insufficient documentation

## 2020-05-08 DIAGNOSIS — M069 Rheumatoid arthritis, unspecified: Secondary | ICD-10-CM | POA: Insufficient documentation

## 2020-05-08 DIAGNOSIS — I7300 Raynaud's syndrome without gangrene: Secondary | ICD-10-CM | POA: Insufficient documentation

## 2020-05-08 DIAGNOSIS — E7800 Pure hypercholesterolemia, unspecified: Secondary | ICD-10-CM | POA: Insufficient documentation

## 2020-05-08 DIAGNOSIS — R112 Nausea with vomiting, unspecified: Secondary | ICD-10-CM | POA: Insufficient documentation

## 2020-05-08 DIAGNOSIS — T8859XA Other complications of anesthesia, initial encounter: Secondary | ICD-10-CM | POA: Insufficient documentation

## 2020-05-08 DIAGNOSIS — Z9109 Other allergy status, other than to drugs and biological substances: Secondary | ICD-10-CM | POA: Insufficient documentation

## 2020-05-12 ENCOUNTER — Ambulatory Visit
Admission: RE | Admit: 2020-05-12 | Discharge: 2020-05-12 | Disposition: A | Discharge status: Home / Self Care | Payer: Medicare PPO | Source: Ambulatory Visit | Attending: Oncology | Admitting: Oncology

## 2020-05-12 ENCOUNTER — Other Ambulatory Visit: Payer: Self-pay

## 2020-05-12 ENCOUNTER — Other Ambulatory Visit: Payer: Self-pay | Admitting: Oncology

## 2020-05-12 DIAGNOSIS — Z853 Personal history of malignant neoplasm of breast: Secondary | ICD-10-CM

## 2020-05-12 DIAGNOSIS — N6320 Unspecified lump in the left breast, unspecified quadrant: Secondary | ICD-10-CM

## 2020-05-14 ENCOUNTER — Other Ambulatory Visit: Payer: Self-pay

## 2020-05-14 ENCOUNTER — Encounter: Payer: Medicare PPO | Attending: Physical Medicine and Rehabilitation | Admitting: Physical Medicine and Rehabilitation

## 2020-05-14 ENCOUNTER — Other Ambulatory Visit: Payer: Self-pay | Admitting: Physical Medicine and Rehabilitation

## 2020-05-14 ENCOUNTER — Encounter: Payer: Self-pay | Admitting: Physical Medicine and Rehabilitation

## 2020-05-14 VITALS — BP 127/81 | HR 92 | Temp 98.1°F | Ht 63.0 in | Wt 137.0 lb

## 2020-05-14 DIAGNOSIS — M47819 Spondylosis without myelopathy or radiculopathy, site unspecified: Secondary | ICD-10-CM | POA: Insufficient documentation

## 2020-05-14 NOTE — Progress Notes (Signed)
Procedure: Ultrasound-guided SPRINT PNS placement. Location: right sided L4-L5 multifidus  Indication: Symptomatic facet arthropathy refractory to medication and injections.   Risks and benefits of procedure were discussed with the patient and consent was obtained.  Ultrasound guidance was used to scan the multifidus muscle. 72mL lidocaine injected to create skin wheal. Needle injected perpendicular to transducer with ultrasound visualization of movement of the multifidus muscle. . Stimulation resulted in tightness, dull sensation in the appropriate distribution. Pulse generator was placed on right hip.   Patient tolerated the procedure well and post-procedure instructions were given.

## 2020-05-17 NOTE — Progress Notes (Signed)
Cardiology Office Note:    Date:  05/18/2020   ID:  Varetta, Chavers 09-02-1940, MRN [id number]  PCP:  [name], MD  Cardiologist:  Shirlee More, MD    Referring MD: [name], MD    ASSESSMENT:    1. LBBB (left bundle branch block)   2. Essential hypertension   3. Mixed hyperlipidemia    PLAN:    In order of problems listed above:  1. Doing well again this year no signs of bundle branch block obviously rate dependent and no evidence of cardiomyopathy.  I know that she requires repeat cardiac imaging this year 2. Stable she is not checking blood pressure at home and nocturnal to treatment on the basis of one reading and continue her ARB 3. Stable await results of recent lipids continue her statin   Next appointment: 1 year   Medication Adjustments/Labs and Tests Ordered: Current medicines are reviewed at length with the patient today.  Concerns regarding medicines are outlined above.  No orders of the defined types were placed in this encounter.  No orders of the defined types were placed in this encounter.   Chief Complaint  Patient presents with  . Follow-up    Left bundle branch block with normal left ventricular systolic function EF 60 to 65% in March 2021  . Hypertension  . Hyperlipidemia    History of Present Illness:    Heidi Lutz is a 80 y.o. female with a hx of left bundle branch block hypertension hyperlipidemia and history of breast cancer with lumpectomy left adjuvant radiation therapy and tamoxifen therapy.  She had a previous Myoview performed 03/24/2017 which showed no evidence of ischemia EF 61%.  She was last seen 05/03/2019.  Following that visit she had an echocardiogram performed 05/08/2019 showing normal left ventricular size and systolic function EF 60 to 65% there is no significant valvular abnormality.  Compliance with diet, lifestyle and medications: Yes  She had unexpected fracture of bone in her foot at the  theater and is healing nicely. Recent exam PCP wellness labs done I requested a copy No cardiovascular symptoms of chest pain shortness of breath palpitation or syncope.  Past Medical History:  Diagnosis Date  . Cancer ([place]) 01/2017   left breast cancer, basal cell and 1 melanoma  . Complication of anesthesia    heart rate spikeed in the PACU on her 2nd knee surgery  . Environmental allergies   . High cholesterol   . OAB (overactive bladder) 02/2020  . Osteoarthritis   . Personal history of radiation therapy   . PONV (postoperative nausea and vomiting)    with 1st knee surgery had n/v, none since  . Raynaud disease    Raynauds disease-takes procardia  . Rheumatoid arthritis [facility])     Past Surgical History:  Procedure Laterality Date  . ABDOMINAL HYSTERECTOMY  1972  . APPENDECTOMY  1953  . BREAST LUMPECTOMY Left 2019  . BREAST LUMPECTOMY WITH RADIOACTIVE SEED AND SENTINEL LYMPH NODE BIOPSY Left 04/07/2017   Procedure: BREAST LUMPECTOMY WITH RADIOACTIVE SEED AND SENTINEL LYMPH NODE BIOPSY;  Surgeon: Erroll Luna, MD;  Location: [facility];  Service: General;  Laterality: Left;  . BUNIONECTOMY  1999  . COLONOSCOPY    . EYE SURGERY     bil cataract  . GANGLION CYST EXCISION  1968  . JOINT REPLACEMENT Left 2017    reverse shoulder replacement  . REPLACEMENT TOTAL KNEE  2012, 2013   Right and Left  .  SHOULDER ARTHROSCOPY Left   . SYMPATHECTOMY  1970  . TONSILLECTOMY  1949    Current Medications: No outpatient medications have been marked as taking for the 05/18/20 encounter (Office Visit) with Richardo Priest, MD.     Allergies:   Fluorouracil, Codeine, Hydrocodone, and Penicillins   Social History   Socioeconomic History  . Marital status: Married    Spouse name: Not on file  . Number of children: Not on file  . Years of education: Not on file  . Highest education level: Not on file  Occupational History  . Not on file  Tobacco Use  . Smoking status: Former Smoker     Quit date: 02/22/1963    Years since quitting: 57.2  . Smokeless tobacco: Never Used  Vaping Use  . Vaping Use: Never used  Substance and Sexual Activity  . Alcohol use: Yes    Alcohol/week: 14.0 standard drinks    Types: 14 Glasses of wine per week    Comment: 2 glasses of wine daily.   . Drug use: No  . Sexual activity: Not on file  Other Topics Concern  . Not on file  Social History Narrative  . Not on file   Social Determinants of Health   Financial Resource Strain: Not on file  Food Insecurity: Not on file  Transportation Needs: Not on file  Physical Activity: Not on file  Stress: Not on file  Social Connections: Not on file     Family History: The patient's family history is negative for Breast cancer. She was adopted. ROS:   Please see the history of present illness.    All other systems reviewed and are negative.  EKGs/Labs/Other Studies Reviewed:    The following studies were reviewed today:  EKG:  EKG ordered today and personally reviewed.  The ekg ordered today demonstrates sinus rhythm normal EKG no findings of bundle branch block  Recent Labs: 04/15/2020: ALT 6; BUN 25; Creatinine 1.46; Hemoglobin 12.4; Platelet Count 210; Potassium 4.3; Sodium 138  Recent Lipid Panel No results found for: CHOL, TRIG, HDL, CHOLHDL, VLDL, LDLCALC, LDLDIRECT  Physical Exam:    VS:  Ht 5\' 3"  (1.6 m)   Wt 141 lb (64 kg)   LMP  (LMP Unknown)   BMI 24.98 kg/m     Wt Readings from Last 3 Encounters:  05/18/20 141 lb (64 kg)  05/14/20 137 lb (62.1 kg)  04/15/20 137 lb (62.1 kg)     GEN: She appears her age well nourished, well developed in no acute distress HEENT: Normal NECK: No JVD; No carotid bruits LYMPHATICS: No lymphadenopathy CARDIAC: RRR, no murmurs, rubs, gallops RESPIRATORY:  Clear to auscultation without rales, wheezing or rhonchi  ABDOMEN: Soft, non-tender, non-distended MUSCULOSKELETAL:  No edema; No deformity  SKIN: Warm and dry NEUROLOGIC:  Alert  and oriented x 3 PSYCHIATRIC:  Normal affect    Signed, Shirlee More, MD  05/18/2020 1:14 PM     [facility]

## 2020-05-18 ENCOUNTER — Other Ambulatory Visit: Payer: Self-pay

## 2020-05-18 ENCOUNTER — Ambulatory Visit: Payer: Medicare PPO | Admitting: Cardiology

## 2020-05-18 ENCOUNTER — Encounter: Payer: Self-pay | Admitting: Cardiology

## 2020-05-18 VITALS — BP 148/78 | HR 82 | Ht 63.0 in | Wt 141.0 lb

## 2020-05-18 DIAGNOSIS — I10 Essential (primary) hypertension: Secondary | ICD-10-CM | POA: Diagnosis not present

## 2020-05-18 DIAGNOSIS — I447 Left bundle-branch block, unspecified: Secondary | ICD-10-CM

## 2020-05-18 DIAGNOSIS — E782 Mixed hyperlipidemia: Secondary | ICD-10-CM | POA: Diagnosis not present

## 2020-05-18 NOTE — Patient Instructions (Signed)

## 2020-05-19 ENCOUNTER — Ambulatory Visit
Admission: RE | Admit: 2020-05-19 | Discharge: 2020-05-19 | Disposition: A | Discharge status: Home / Self Care | Payer: Medicare PPO | Source: Ambulatory Visit | Attending: Oncology | Admitting: Oncology

## 2020-05-19 ENCOUNTER — Other Ambulatory Visit: Payer: Self-pay | Admitting: Oncology

## 2020-05-19 DIAGNOSIS — N6320 Unspecified lump in the left breast, unspecified quadrant: Secondary | ICD-10-CM

## 2020-05-19 HISTORY — PX: BREAST BIOPSY: SHX20

## 2020-05-28 LAB — COLOGUARD: COLOGUARD: NEGATIVE

## 2020-07-09 NOTE — Progress Notes (Signed)
Subjective:    Patient ID: Heidi Lutz, female    DOB: 04-16-1940, 80 y.o.   MRN: [id number]  HPI  Heidi Lutz is an 80 year old woman who presents for follow-up of lower back pain.  1) Lower back pain:  -She has had this pain for the last 2 years and it has gradually gotten worse.  -she feels the SPRINT PNS may have helped- she has good days and bad days -the meloxicam and and lidocaine patches together really help, but she has increase in her creatinine so had to stop the meloxicam -she took one of the tramadol she had from a long tine ago and this really helped.  -The pain radiates into her legs at times, but her back pain is much more painful than the pain in her legs. -she does drink red wine at night -uses the medications very sparingly- only when pain is severe -She has tied Tylenol, Ibuprofen, Lyrica 100mg  BID, and spinal injections and ablations. The injections did not help -She took herself off the Lyrica, it was confusing her.  -She is currently using Lidocaine patch and this helps.  -Her pain is worse on the right side, sometimes on the left.  -Her husband accompanies her today. They used to like to walk three miles together but have not for the past year because of her pain. She misses the joy of walking.   Pain Inventory Average Pain 7 Pain Right Now 4 My pain is constant and stabbing  In the last 24 hours, has pain interfered with the following? General activity 9 Relation with others 0 Enjoyment of life 9 What TIME of day is your pain at its worst? morning , daytime and evening Sleep (in general) Good  Pain is worse with: walking Pain improves with: pacing activities and medication Relief from Meds: 2      Family History  Adopted: Yes  Problem Relation Age of Onset  . Breast cancer Neg Hx    Social History   Socioeconomic History  . Marital status: Married    Spouse name: Not on file  . Number of children: Not on file  . Years of  education: Not on file  . Highest education level: Not on file  Occupational History  . Not on file  Tobacco Use  . Smoking status: Former Smoker    Quit date: 02/22/1963    Years since quitting: 57.4  . Smokeless tobacco: Never Used  Vaping Use  . Vaping Use: Never used  Substance and Sexual Activity  . Alcohol use: Yes    Alcohol/week: 14.0 standard drinks    Types: 14 Glasses of wine per week    Comment: 2 glasses of wine daily.   . Drug use: No  . Sexual activity: Not on file  Other Topics Concern  . Not on file  Social History Narrative  . Not on file   Social Determinants of Health   Financial Resource Strain: Not on file  Food Insecurity: Not on file  Transportation Needs: Not on file  Physical Activity: Not on file  Stress: Not on file  Social Connections: Not on file   Past Surgical History:  Procedure Laterality Date  . ABDOMINAL HYSTERECTOMY  1972  . APPENDECTOMY  1953  . BREAST LUMPECTOMY Left 2019  . BREAST LUMPECTOMY WITH RADIOACTIVE SEED AND SENTINEL LYMPH NODE BIOPSY Left 04/07/2017   Procedure: BREAST LUMPECTOMY WITH RADIOACTIVE SEED AND SENTINEL LYMPH NODE BIOPSY;  Surgeon: Erroll Luna, MD;  Location: [facility] OR;  Service: General;  Laterality: Left;  . BUNIONECTOMY  1999  . COLONOSCOPY    . EYE SURGERY     bil cataract  . GANGLION CYST EXCISION  1968  . JOINT REPLACEMENT Left 2017    reverse shoulder replacement  . REPLACEMENT TOTAL KNEE  2012, 2013   Right and Left  . SHOULDER ARTHROSCOPY Left   . SYMPATHECTOMY  1970  . TONSILLECTOMY  1949   Past Medical History:  Diagnosis Date  . Cancer ([place]) 01/2017   left breast cancer, basal cell and 1 melanoma  . Complication of anesthesia    heart rate spikeed in the PACU on her 2nd knee surgery  . Environmental allergies   . High cholesterol   . OAB (overactive bladder) 02/2020  . Osteoarthritis   . Personal history of radiation therapy   . PONV (postoperative nausea and vomiting)    with 1st  knee surgery had n/v, none since  . Raynaud disease    Raynauds disease-takes procardia  . Rheumatoid arthritis ([facility])    BP 129/78   Pulse 88   Temp 97.8 F (36.6 C)   Ht 5\' 3"  (1.6 m)   Wt 140 lb 9.6 oz (63.8 kg)   LMP  (LMP Unknown)   SpO2 96%   BMI 24.91 kg/m   Opioid Risk Score:   Fall Risk Score:  `1  Depression screen PHQ 2/9  Depression screen [facility] 2/9 03/20/2020 07/06/2017 04/18/2017 03/16/2017  Decreased Interest 0 0 0 0  Down, Depressed, Hopeless 0 0 0 0  PHQ - 2 Score 0 0 0 0    Review of Systems  Musculoskeletal: Positive for back pain.  All other systems reviewed and are negative.      Objective:   Physical Exam Gen: no distress, normal appearing HEENT: oral mucosa pink and moist, NCAT Cardio: Reg rate Chest: normal effort, normal rate of breathing Abd: soft, non-distended Ext: no edema Skin: intact Musculoskeletal: Tender to palpation in lower back, pain is worse with extension than flexion, +Kemp's sign right greater than left. Negative Slump test bilaterally.  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 -Discussed current symptoms of pain and history of pain.  -Discussed benefits of exercise in reducing pain. -refilled lidocaine patch -Stop Lyrica given confusion -recommended bacopa supplement -stop meloxicam given worsening kidney function -discussed that tramadol can also cause kidney injury- recommended discussing with Dr. Darron Doom before taking. One time #30 pills sent for use when pain is very severe. Discussed other medication options and she has failed, tylenol and nerve medications -Discussed following foods that may reduce pain: 1) Ginger 2) Blueberries 3) Salmon 4) Pumpkin seeds 5) dark chocolate 6) turmeric 7) tart cherries 8) virgin olive oil 9) chilli peppers 10) mint  Turmeric to reduce inflammation--can be used in cooking or  taken as a supplement.  Benefits of turmeric:  -Highly anti-inflammatory  -Increases antioxidants  -Improves memory, attention, brain disease  -Lowers risk of heart disease  -May help prevent cancer  -Decreases pain  -Alleviates depression  -Delays aging and decreases risk of chronic disease  -Consume with black pepper to increase absorption    Turmeric Milk Recipe:  1 cup milk  1 tsp turmeric  1 tsp cinnamon  1 tsp grated ginger (optional)  Black pepper (boosts the anti-inflammatory properties of turmeric).  1 tsp honey   2) AKI: stop meloxicam.  Advised that Tramadol can also impair kidneys and she should discuss with Dr. Darron Doom before taking.

## 2020-07-14 ENCOUNTER — Encounter: Payer: Medicare PPO | Attending: Physical Medicine and Rehabilitation | Admitting: Physical Medicine and Rehabilitation

## 2020-07-14 ENCOUNTER — Other Ambulatory Visit: Payer: Self-pay | Admitting: Physical Medicine and Rehabilitation

## 2020-07-14 ENCOUNTER — Encounter: Payer: Self-pay | Admitting: Physical Medicine and Rehabilitation

## 2020-07-14 ENCOUNTER — Other Ambulatory Visit: Payer: Self-pay

## 2020-07-14 VITALS — BP 129/78 | HR 88 | Temp 97.8°F | Ht 63.0 in | Wt 140.6 lb

## 2020-07-14 DIAGNOSIS — Z5181 Encounter for therapeutic drug level monitoring: Secondary | ICD-10-CM | POA: Diagnosis present

## 2020-07-14 DIAGNOSIS — G894 Chronic pain syndrome: Secondary | ICD-10-CM | POA: Diagnosis present

## 2020-07-14 DIAGNOSIS — Z79891 Long term (current) use of opiate analgesic: Secondary | ICD-10-CM

## 2020-07-14 MED ORDER — LIDOCAINE 5 % EX PTCH: 2.0000 | MEDICATED_PATCH | CUTANEOUS | 1 refills | Status: DC

## 2020-07-14 MED ORDER — TRAMADOL HCL 50 MG PO TABS: 50.0000 mg | ORAL_TABLET | Freq: Every day | ORAL | 0 refills | Status: DC | PRN

## 2020-07-14 NOTE — Patient Instructions (Addendum)
Capsaicin  Bacopa 500mg   -Discussed following foods that may reduce pain: 1) Ginger (especially studied for arthritis)- reduce leukotriene production to decrease inflammation 2) Blueberries- high in phytonutrients that decrease inflammation 3) Salmon- marine omega-3s reduce joint swelling and pain 4) Pumpkin seeds- reduce inflammation 5) dark chocolate- reduces inflammation 6) turmeric- reduces inflammation 7) tart cherries - reduce pain and stiffness 8) extra virgin olive oil - its compound olecanthal helps to block prostaglandins  9) chili peppers- can be eaten or applied topically via capsaicin 10) mint- helpful for headache, muscle aches, joint pain, and itching 11) garlic- reduces inflammation  Link to further information on diet for chronic pain: [email or web address]  Ask her if Tramadol

## 2020-08-13 ENCOUNTER — Other Ambulatory Visit: Payer: Self-pay | Admitting: Physical Medicine and Rehabilitation

## 2020-10-16 ENCOUNTER — Ambulatory Visit: Payer: Medicare PPO | Admitting: Physical Medicine and Rehabilitation

## 2020-11-13 ENCOUNTER — Encounter: Payer: Medicare PPO | Admitting: Physical Medicine and Rehabilitation

## 2020-11-18 ENCOUNTER — Other Ambulatory Visit: Payer: Self-pay | Admitting: Oncology

## 2021-04-16 ENCOUNTER — Other Ambulatory Visit: Payer: Self-pay | Admitting: *Deleted

## 2021-04-16 DIAGNOSIS — C50112 Malignant neoplasm of central portion of left female breast: Secondary | ICD-10-CM

## 2021-04-16 NOTE — Progress Notes (Signed)
Patient Care Team: [name], MD as PCP - General (Family Medicine) Magrinat, [name], MD (Inactive) as Consulting Physician (Oncology) Erroll Luna, MD as Consulting Physician (General Surgery)  DIAGNOSIS:    ICD-10-CM   1. Malignant neoplasm of central portion of left breast in female, estrogen receptor positive ([place])  C50.112    Z17.0       SUMMARY OF ONCOLOGIC HISTORY: Oncology History  Malignant neoplasm of central portion of left breast in female, estrogen receptor positive ([place])  03/08/2017 Initial Diagnosis   Central left breast biopsy for a clinical T1b N0, stage IA i IDC, grade 1, e ER/PR positive, HER-2 not amplified, with an Ki-67 of 2%   04/07/2017 Surgery   left breast lumpectomy with sentinel lymph node sampling  pT1b pN0, stage IA invasive ductal carcinoma, grade II, with negative margins, total 1 lymph node removed from the axilla     CHIEF COMPLIANT: Follow-up of estrogen receptor positive breast cancer and to establish oncology care  INTERVAL HISTORY: Heidi Lutz is a 81 y.o. with above-mentioned history of estrogen receptor positive breast cancer. She presents to the clinic today for follow-up.  She is tolerating tamoxifen extremely well without any problems or concerns but denies any hot flashes or arthralgias or myalgias.  Denies any lumps or nodules.  Her left breast is quite hard and from radiation so it is very difficult to examine.  Last year she had extensive evaluation and biopsies which were all benign.  She is tolerating tamoxifen extremely well without any problems or concerns.  ALLERGIES:  is allergic to fluorouracil, codeine, hydrocodone, and penicillins.  MEDICATIONS:  Current Outpatient Medications  Medication Sig Dispense Refill   acetaminophen (TYLENOL) 500 MG tablet Take 1,000 mg by mouth every 6 (six) hours as needed for moderate pain or headache.      atorvastatin (LIPITOR) 20 MG tablet Take 20 mg by mouth daily.      Black Cohosh 540 MG CAPS Take 540 mg by mouth daily.      cetirizine (ZYRTEC) 10 MG tablet Take 10 mg by mouth daily.     Cholecalciferol 125 MCG (5000 UT) TABS Take by mouth.     CRANBERRY PO Take 1 capsule by mouth daily.      Glucosamine-Chondroit-Vit C-Mn (GLUCOSAMINE-CHONDROITIN MAX ST) CAPS Take 1 capsule by mouth daily.      lidocaine (LIDODERM) 5 % APPLY 2 PATCHES TO SKIN EVERY DAY, REMOVE AND REPLACE PATCH AFTER 12 HRS 180 patch 0   losartan (COZAAR) 25 MG tablet Take 25 mg by mouth daily.     Magnesium 200 MG TABS Take 200 mg by mouth daily.      meloxicam (MOBIC) 7.5 MG tablet TAKE 1 TABLET(7.5 MG) BY MOUTH DAILY (Patient not taking: Reported on 07/14/2020) 30 tablet 1   Multiple Vitamins-Minerals (CENTRUM ADULTS PO) Take 1 tablet by mouth daily.      NIFEdipine (PROCARDIA XL/ADALAT-CC) 90 MG 24 hr tablet Take 90 mg by mouth daily.      Polyethyl Glycol-Propyl Glycol (SYSTANE OP) Place 1 drop into both eyes daily.     Probiotic Product (PROBIOTIC ADVANCED PO) Take 1 capsule by mouth daily.      solifenacin (VESICARE) 10 MG tablet Take by mouth.     tamoxifen (NOLVADEX) 20 MG tablet TAKE 1 TABLET EVERY DAY 90 tablet 4   traMADol (ULTRAM) 50 MG tablet Take 1 tablet (50 mg total) by mouth daily as needed. 30 tablet 0   No  current facility-administered medications for this visit.    PHYSICAL EXAMINATION: ECOG PERFORMANCE STATUS: 1 - Symptomatic but completely ambulatory  Vitals:   04/19/21 1053  BP: 135/60  Pulse: (!) 108  Resp: 18  Temp: (!) 97.3 F (36.3 C)  SpO2: 98%   Filed Weights   04/19/21 1053  Weight: 128 lb 4.8 oz (58.2 kg)    BREAST:  (exam performed in the presence of a chaperone)  LABORATORY DATA:  I have reviewed the data as listed CMP Latest Ref Rng & Units 04/15/2020 04/15/2019 09/18/2018  Glucose 70 - 99 mg/dL 100(H) 99 114(H)  BUN 8 - 23 mg/dL 25(H) 17 26(H)  Creatinine 0.44 - 1.00 mg/dL 1.46(H) 1.29(H) 1.42(H)  Sodium 135 - 145 mmol/L 138 143 143   Potassium 3.5 - 5.1 mmol/L 4.3 4.8 4.4  Chloride 98 - 111 mmol/L 102 104 105  CO2 22 - 32 mmol/L $RemoveB'25 27 26  'QbJmDDXe$ Calcium 8.9 - 10.3 mg/dL 9.8 9.3 9.5  Total Protein 6.5 - 8.1 g/dL 7.2 7.2 7.4  Total Bilirubin 0.3 - 1.2 mg/dL 0.6 0.6 0.5  Alkaline Phos 38 - 126 U/L 47 51 56  AST 15 - 41 U/L $Remo'18 16 23  'eUfAZ$ ALT 0 - 44 U/L $Remo'6 7 8    'hGAOZ$ Lab Results  Component Value Date   WBC 6.8 04/19/2021   HGB 12.1 04/19/2021   HCT 35.8 (L) 04/19/2021   MCV 98.4 04/19/2021   PLT 239 04/19/2021   NEUTROABS 3.2 04/19/2021    ASSESSMENT & PLAN:  Malignant neoplasm of central portion of left breast in female, estrogen receptor positive ([place]) 04/07/2017:left breast lumpectomy with sentinel lymph node sampling  pT1b pN0, stage IA invasive ductal carcinoma, grade II, with negative margins, total 1 lymph node removed from the axilla, ER 100%, PR 70%, HER2 negative ratio 1.39, Ki-67 2% adjuvant radiation 05/10/2017 - 06/06/2017  Current treatment: Tamoxifen started 03/17/2017 Plan of treatment: 10 years (patient preference)  Breast cancer surveillance: 1.  Breast MRI 11/11/2019: No evidence of malignancy  2. mammogram 05/12/2020: Indeterminate left breast shadowing 12 o'clock position, ultrasound-guided biopsy: Benign, density category C 3.  Breast exam 04/19/2021: Firmness in the left breast related to prior radiation scarring.  Otherwise no palpable lumps or nodules of concern.   Return to clinic in 1 year for follow-up    No orders of the defined types were placed in this encounter.  The patient has a good understanding of the overall plan. she agrees with it. she will call with any problems that may develop before the next visit here.  Total time spent: 45 mins including face to face time and time spent for planning, charting and coordination of care  Rulon Eisenmenger, MD, MPH 04/19/2021  I, Thana Ates, am acting as scribe for Dr. Nicholas Lose.  I have reviewed the above documentation for accuracy and  completeness, and I agree with the above.

## 2021-04-19 ENCOUNTER — Inpatient Hospital Stay: Payer: Medicare PPO | Attending: Hematology and Oncology | Admitting: Hematology and Oncology

## 2021-04-19 ENCOUNTER — Other Ambulatory Visit: Payer: Self-pay

## 2021-04-19 ENCOUNTER — Inpatient Hospital Stay: Payer: Medicare PPO

## 2021-04-19 DIAGNOSIS — Z170 Estrogen receptor positive status [ER+]: Secondary | ICD-10-CM

## 2021-04-19 DIAGNOSIS — Z923 Personal history of irradiation: Secondary | ICD-10-CM | POA: Insufficient documentation

## 2021-04-19 DIAGNOSIS — Z79899 Other long term (current) drug therapy: Secondary | ICD-10-CM | POA: Insufficient documentation

## 2021-04-19 DIAGNOSIS — C50112 Malignant neoplasm of central portion of left female breast: Secondary | ICD-10-CM | POA: Diagnosis present

## 2021-04-19 LAB — CMP (CANCER CENTER ONLY)
ALT: 6 U/L (ref 0–44)
AST: 17 U/L (ref 15–41)
Albumin: 4.2 g/dL (ref 3.5–5.0)
Alkaline Phosphatase: 36 U/L — ABNORMAL LOW (ref 38–126)
Anion gap: 9 (ref 5–15)
BUN: 24 mg/dL — ABNORMAL HIGH (ref 8–23)
CO2: 26 mmol/L (ref 22–32)
Calcium: 9.8 mg/dL (ref 8.9–10.3)
Chloride: 106 mmol/L (ref 98–111)
Creatinine: 1.56 mg/dL — ABNORMAL HIGH (ref 0.44–1.00)
GFR, Estimated: 33 mL/min — ABNORMAL LOW (ref >60)
Glucose, Bld: 100 mg/dL — ABNORMAL HIGH (ref 70–99)
Potassium: 4.7 mmol/L (ref 3.5–5.1)
Sodium: 141 mmol/L (ref 135–145)
Total Bilirubin: 0.6 mg/dL (ref 0.3–1.2)
Total Protein: 7.3 g/dL (ref 6.5–8.1)

## 2021-04-19 LAB — CBC WITH DIFFERENTIAL (CANCER CENTER ONLY)
Abs Immature Granulocytes: 0.02 10*3/uL (ref 0.00–0.07)
Basophils Absolute: 0.1 10*3/uL (ref 0.0–0.1)
Basophils Relative: 1 %
Eosinophils Absolute: 0.4 10*3/uL (ref 0.0–0.5)
Eosinophils Relative: 5 %
HCT: 35.8 % — ABNORMAL LOW (ref 36.0–46.0)
Hemoglobin: 12.1 g/dL (ref 12.0–15.0)
Immature Granulocytes: 0 %
Lymphocytes Relative: 34 %
Lymphs Abs: 2.3 10*3/uL (ref 0.7–4.0)
MCH: 33.2 pg (ref 26.0–34.0)
MCHC: 33.8 g/dL (ref 30.0–36.0)
MCV: 98.4 fL (ref 80.0–100.0)
Monocytes Absolute: 0.9 10*3/uL (ref 0.1–1.0)
Monocytes Relative: 13 %
Neutro Abs: 3.2 10*3/uL (ref 1.7–7.7)
Neutrophils Relative %: 47 %
Platelet Count: 239 10*3/uL (ref 150–400)
RBC: 3.64 MIL/uL — ABNORMAL LOW (ref 3.87–5.11)
RDW: 13.7 % (ref 11.5–15.5)
WBC Count: 6.8 10*3/uL (ref 4.0–10.5)
nRBC: 0 % (ref 0.0–0.2)

## 2021-04-19 MED ORDER — TRAMADOL HCL 50 MG PO TABS: 25.0000 mg | ORAL_TABLET | Freq: Every day | ORAL | 0 refills | Status: DC | PRN

## 2021-04-19 MED ORDER — CALCIUM GLUCONATE 500 MG PO TABS: 1.0000 | ORAL_TABLET | Freq: Two times a day (BID) | ORAL | Status: DC

## 2021-04-19 NOTE — Assessment & Plan Note (Signed)
04/07/2017:left breast lumpectomy with sentinel lymph node sampling  pT1b pN0, stage IA invasive ductal carcinoma, grade II, with negative margins, total 1 lymph node removed from the axilla, ER 100%, PR 70%, HER2 negative ratio 1.39, Ki-67 2% adjuvant radiation 05/10/2017 - 06/06/2017  Current treatment: Tamoxifen started 03/17/2017 Plan of treatment: 5 years  Breast cancer surveillance: 1.  Breast MRI 11/11/2019: No evidence of malignancy  2. mammogram 05/12/2020: Indeterminate left breast shadowing 12 o'clock position, ultrasound-guided biopsy: Benign, density category C 3.  Breast exam 04/19/2021: Benign   Return to clinic in 1 year for follow-up

## 2021-05-10 ENCOUNTER — Other Ambulatory Visit: Payer: Self-pay | Admitting: Family Medicine

## 2021-05-10 DIAGNOSIS — Z1231 Encounter for screening mammogram for malignant neoplasm of breast: Secondary | ICD-10-CM

## 2021-05-13 ENCOUNTER — Ambulatory Visit
Admission: RE | Admit: 2021-05-13 | Discharge: 2021-05-13 | Disposition: A | Discharge status: Home / Self Care | Payer: Medicare PPO | Source: Ambulatory Visit | Attending: Family Medicine | Admitting: Family Medicine

## 2021-05-13 ENCOUNTER — Other Ambulatory Visit: Payer: Self-pay

## 2021-05-13 DIAGNOSIS — Z1231 Encounter for screening mammogram for malignant neoplasm of breast: Secondary | ICD-10-CM

## 2021-06-24 ENCOUNTER — Ambulatory Visit: Payer: Medicare PPO | Admitting: Cardiology

## 2021-07-22 ENCOUNTER — Ambulatory Visit: Payer: Medicare PPO | Admitting: Cardiology

## 2021-08-05 ENCOUNTER — Ambulatory Visit: Payer: Medicare PPO | Admitting: Cardiology

## 2021-08-05 ENCOUNTER — Encounter: Payer: Self-pay | Admitting: Cardiology

## 2021-08-05 VITALS — BP 126/68 | HR 86 | Ht 63.0 in | Wt 127.0 lb

## 2021-08-05 DIAGNOSIS — I447 Left bundle-branch block, unspecified: Secondary | ICD-10-CM | POA: Diagnosis not present

## 2021-08-05 DIAGNOSIS — E782 Mixed hyperlipidemia: Secondary | ICD-10-CM | POA: Diagnosis not present

## 2021-08-05 DIAGNOSIS — I10 Essential (primary) hypertension: Secondary | ICD-10-CM

## 2021-08-05 NOTE — Patient Instructions (Signed)

## 2021-08-05 NOTE — Progress Notes (Signed)
Cardiology Office Note:    Date:  08/05/2021   ID:  [name], [name] 1940-09-25, MRN [id number]  PCP:  [name], MD  Cardiologist:  Shirlee More, MD    Referring MD: [name], MD    ASSESSMENT:    1. LBBB (left bundle branch block)   2. Essential hypertension   3. Mixed hyperlipidemia    PLAN:    In order of problems listed above:  She continues to do well underlying she does not have cardiomyopathy or evidence of CAD and no progressive conduction system disease Stable well-controlled she will continue ARB calcium channel blocker Stable continue her statin with labs follow-up with her PCP   Next appointment: 1 year   Medication Adjustments/Labs and Tests Ordered: Current medicines are reviewed at length with the patient today.  Concerns regarding medicines are outlined above.  Orders Placed This Encounter  Procedures   EKG 12-Lead   No orders of the defined types were placed in this encounter.   Chief Complaint  Patient presents with   Follow-up   Hypertension   Hyperlipidemia    History of Present Illness:    Heidi Lutz is a 81 y.o. female with a hx of left bundle branch block hypertension hyperlipidemia and a history of breast cancer with lumpectomy on the left adjuvant radiation therapy and tamoxifen therapy.  She was last seen 05/18/2020.  She hadan echocardiogram performed 05/08/2019 showing normal left ventricular size and systolic function EF 60 to 65% there is no significant valvular abnormality and had a previous Myoview in 2019 showing EF of 61% and normal perfusion.  Compliance with diet, lifestyle and medications: Yes  She continues to do well remains on tamoxifen follows with oncology. EKG same pattern left bundle branch block no progression of conduction system disease Her blood pressure is well controlled we discussed techniques for good blood pressure measurement and the need to record home blood pressures She  tolerates her statin without muscle pain or weakness She has had no cardiovascular symptoms of edema shortness of breath chest pain palpitation or syncope Labs are followed with [facility] physician [facility] 04/19/2021 hemoglobin 12.1 creatinine 1.56 potassium 4.7 Past Medical History:  Diagnosis Date   Cancer ([place]) 01/2017   left breast cancer, basal cell and 1 melanoma   Complication of anesthesia    heart rate spikeed in the PACU on her 2nd knee surgery   Environmental allergies    High cholesterol    OAB (overactive bladder) 02/2020   Osteoarthritis    Personal history of radiation therapy    PONV (postoperative nausea and vomiting)    with 1st knee surgery had n/v, none since   Raynaud disease    Raynauds disease-takes procardia   Rheumatoid arthritis ([place])     Past Surgical History:  Procedure Laterality Date   ABDOMINAL HYSTERECTOMY  1972   APPENDECTOMY  1953   BREAST BIOPSY Left 05/19/2020   benign   BREAST LUMPECTOMY Left 2019   BREAST LUMPECTOMY WITH RADIOACTIVE SEED AND SENTINEL LYMPH NODE BIOPSY Left 04/07/2017   Procedure: BREAST LUMPECTOMY WITH RADIOACTIVE SEED AND SENTINEL LYMPH NODE BIOPSY;  Surgeon: Erroll Luna, MD;  Location: [facility];  Service: General;  Laterality: Left;   BUNIONECTOMY  1999   COLONOSCOPY     EYE SURGERY     bil cataract   GANGLION CYST EXCISION  1968   JOINT REPLACEMENT Left 2017    reverse shoulder replacement   REPLACEMENT TOTAL  KNEE  2012, 2013   Right and Left   SHOULDER ARTHROSCOPY Left    SYMPATHECTOMY  1970   TONSILLECTOMY  1949    Current Medications: Current Meds  Medication Sig   acetaminophen (TYLENOL) 500 MG tablet Take 1,000 mg by mouth every 6 (six) hours as needed for moderate pain or headache.    atorvastatin (LIPITOR) 20 MG tablet Take 20 mg by mouth daily.   Black Cohosh 540 MG CAPS Take 540 mg by mouth daily.    calcium gluconate 500 MG tablet Take 1 tablet (500 mg total) by mouth 2 (two) times daily.    cetirizine (ZYRTEC) 10 MG tablet Take 10 mg by mouth daily.   CRANBERRY PO Take 1 capsule by mouth daily.    Glucosamine-Chondroit-Vit C-Mn (GLUCOSAMINE-CHONDROITIN MAX ST) CAPS Take 1 capsule by mouth daily.    lidocaine (LIDODERM) 5 % APPLY 2 PATCHES TO SKIN EVERY DAY, REMOVE AND REPLACE PATCH AFTER 12 HRS   losartan (COZAAR) 25 MG tablet Take 25 mg by mouth daily.   Magnesium 200 MG TABS Take 200 mg by mouth daily.    Multiple Vitamins-Minerals (CENTRUM ADULTS PO) Take 1 tablet by mouth daily.    NIFEdipine (PROCARDIA XL/ADALAT-CC) 90 MG 24 hr tablet Take 90 mg by mouth daily.    Probiotic Product (PROBIOTIC ADVANCED PO) Take 1 capsule by mouth daily.    solifenacin (VESICARE) 10 MG tablet Take by mouth.   tamoxifen (NOLVADEX) 20 MG tablet TAKE 1 TABLET EVERY DAY   traMADol (ULTRAM) 50 MG tablet Take 0.5 tablets (25 mg total) by mouth daily as needed.     Allergies:   Fluorouracil, Codeine, Hydrocodone, and Penicillins   Social History   Socioeconomic History   Marital status: Married    Spouse name: Not on file   Number of children: Not on file   Years of education: Not on file   Highest education level: Not on file  Occupational History   Not on file  Tobacco Use   Smoking status: Former    Types: Cigarettes    Quit date: 02/22/1963    Years since quitting: 58.4    Passive exposure: Past   Smokeless tobacco: Never  Vaping Use   Vaping Use: Never used  Substance and Sexual Activity   Alcohol use: Yes    Alcohol/week: 14.0 standard drinks of alcohol    Types: 14 Glasses of wine per week    Comment: 2 glasses of wine daily.    Drug use: No   Sexual activity: Not on file  Other Topics Concern   Not on file  Social History Narrative   Not on file   Social Determinants of Health   Financial Resource Strain: Not on file  Food Insecurity: Not on file  Transportation Needs: Not on file  Physical Activity: Not on file  Stress: Not on file  Social Connections: Not on  file     Family History: The patient's family history is negative for Breast cancer. She was adopted. ROS:   Please see the history of present illness.    All other systems reviewed and are negative.  EKGs/Labs/Other Studies Reviewed:    The following studies were reviewed today:  EKG:  EKG ordered today and personally reviewed.  The ekg ordered today demonstrates sinus rhythm left bundle branch block 1 PVC  Recent Labs: 04/19/2021: ALT 6; BUN 24; Creatinine 1.56; Hemoglobin 12.1; Platelet Count 239; Potassium 4.7; Sodium 141    Physical Exam:  VS:  BP 126/68 (BP Location: Left Arm, Patient Position: Sitting)   Pulse 86   Ht '5\' 3"'$  (1.6 m)   Wt 127 lb (57.6 kg)   LMP  (LMP Unknown)   SpO2 97%   BMI 22.50 kg/m     Wt Readings from Last 3 Encounters:  08/05/21 127 lb (57.6 kg)  04/19/21 128 lb 4.8 oz (58.2 kg)  07/14/20 140 lb 9.6 oz (63.8 kg)     GEN: She appears her age well nourished, well developed in no acute distress HEENT: Normal NECK: No JVD; No carotid bruits LYMPHATICS: No lymphadenopathy CARDIAC: Paradoxical second heart sound RRR, no murmurs, rubs, gallops RESPIRATORY:  Clear to auscultation without rales, wheezing or rhonchi  ABDOMEN: Soft, non-tender, non-distended MUSCULOSKELETAL:  No edema; No deformity  SKIN: Warm and dry NEUROLOGIC:  Alert and oriented x 3 PSYCHIATRIC:  Normal affect    Signed, Shirlee More, MD  08/05/2021 11:08 AM    [place]

## 2021-11-24 ENCOUNTER — Ambulatory Visit: Payer: Medicare PPO | Attending: Family Medicine | Admitting: Physical Therapy

## 2021-11-24 DIAGNOSIS — M6281 Muscle weakness (generalized): Secondary | ICD-10-CM | POA: Insufficient documentation

## 2021-11-24 DIAGNOSIS — R252 Cramp and spasm: Secondary | ICD-10-CM | POA: Diagnosis present

## 2021-11-24 DIAGNOSIS — M5459 Other low back pain: Secondary | ICD-10-CM | POA: Insufficient documentation

## 2021-11-24 NOTE — Therapy (Signed)
OUTPATIENT PHYSICAL THERAPY THORACOLUMBAR EVALUATION   Patient Name: Heidi Lutz MRN: [id number] DOB:1941-01-16, 81 y.o., female Today's Date: 11/24/2021   PT End of Session - 11/24/21 1411     Visit Number 1    Number of Visits 12    Date for PT Re-Evaluation 01/05/22    Authorization Type [employer] MCR    Progress Note Due on Visit 10    PT Start Time 6720    PT Stop Time 9470    PT Time Calculation (min) 29 min    Activity Tolerance Patient tolerated treatment well    Behavior During Therapy [facility] for tasks assessed/performed             Past Medical History:  Diagnosis Date   Cancer ([place]) 01/2017   left breast cancer, basal cell and 1 melanoma   Complication of anesthesia    heart rate spikeed in the PACU on her 2nd knee surgery   Environmental allergies    High cholesterol    OAB (overactive bladder) 02/2020   Osteoarthritis    Personal history of radiation therapy    PONV (postoperative nausea and vomiting)    with 1st knee surgery had n/v, none since   Raynaud disease    Raynauds disease-takes procardia   Rheumatoid arthritis ([place])    Past Surgical History:  Procedure Laterality Date   ABDOMINAL HYSTERECTOMY  1972   APPENDECTOMY  1953   BREAST BIOPSY Left 05/19/2020   benign   BREAST LUMPECTOMY Left 2019   BREAST LUMPECTOMY WITH RADIOACTIVE SEED AND SENTINEL LYMPH NODE BIOPSY Left 04/07/2017   Procedure: BREAST LUMPECTOMY WITH RADIOACTIVE SEED AND SENTINEL LYMPH NODE BIOPSY;  Surgeon: Erroll Luna, MD;  Location: [place];  Service: General;  Laterality: Left;   BUNIONECTOMY  1999   COLONOSCOPY     EYE SURGERY     bil cataract   GANGLION CYST EXCISION  1968   JOINT REPLACEMENT Left 2017    reverse shoulder replacement   REPLACEMENT TOTAL KNEE  2012, 2013   Right and Left   SHOULDER ARTHROSCOPY Left    [place]   Patient Active Problem List   Diagnosis Date Noted   Rheumatoid arthritis ([place])    Raynaud  disease    PONV (postoperative nausea and vomiting)    Personal history of radiation therapy    Osteoarthritis    High cholesterol    Environmental allergies    Complication of anesthesia    OAB (overactive bladder) 02/2020   Age-related osteoporosis without current pathological fracture 10/31/2019   History of vertebral compression fracture 07/16/2019   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   Pleuritic chest pain 04/10/2018   GAD (generalized anxiety disorder) 04/25/2017   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   LBBB (left bundle branch block) 03/22/2017   Pre-operative cardiovascular examination 03/22/2017   Mixed hyperlipidemia 03/22/2017   Malignant neoplasm of central portion of left breast in female, estrogen receptor positive ([place]) 03/16/2017   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/2017   Osteopenia 03/03/2017   Cancer ([place]) 01/2017   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   Lumbar degenerative disc disease 02/27/2015   Lumbar spinal stenosis 02/27/2015   Osteoarthritis of spine with radiculopathy, lumbar region 02/27/2015   Basal cell carcinoma 12/15/2014   Closed compression fracture of L1 lumbar vertebra, with routine healing, subsequent encounter 11/26/2013   Insomnia 02/02/2010   Hyperlipidemia 04/22/2009   Essential hypertension 09/19/2008  Allergic rhinitis 07/31/2007   IBS (irritable bowel syndrome) 08/18/2006   Raynaud's disease 08/18/2006    PCP: [name], MD  REFERRING PROVIDER: [name], MD  REFERRING DIAG: [phone] (ICD-10-CM) - Lumbar stenosis  Rationale for Evaluation and Treatment Rehabilitation  THERAPY DIAG:  Other low back pain  Cramp and spasm  Muscle weakness (generalized)  ONSET DATE: chronic since age 27  SUBJECTIVE:                                                                                                                                                                                            SUBJECTIVE STATEMENT: Heidi Lutz reports she had had chronic low back pain since she was 24.  She has both RA and OA, and has tried several rounds of physical therapy, injections in her back which did help for a couple years, ablation which did not help at all, and even trialed temporary spinal cord stimulator without benefit. Her daughter and a friend had PT at this location and told her about dry needling, and she wonders if this would help her too.  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  PAIN:  Are you having pain? Yes: NPRS scale: 6/10 Pain location: low back  Pain description: constant pain also gets "screaming meemees" 25/10 Aggravating factors: pressure/weather changes, walking more than 10 min, prolonged standing >10 min  Relieving factors: sitting down   PRECAUTIONS: None  WEIGHT BEARING RESTRICTIONS No  FALLS:  Has patient fallen in last 6 months? No  LIVING ENVIRONMENT: Lives with: lives with their spouse Lives in: House/apartment Stairs:  3rd floor apartment, lives in [place] Has following equipment at home: Gilford Rile - 2 wheeled  OCCUPATION: retired  PLOF: Independent  PATIENT GOALS improve pain   OBJECTIVE:   DIAGNOSTIC FINDINGS:  04/24/2019 COMPARISON: December 2016. September 2018.    INDICATION: Wedge compression fracture of unspecified thoracic vertebra, initial encounter for closed fracture (#).   TECHNIQUE: MRI SPINE THORACIC WO IV CONTRAST-- Multiplanar, multisequence MR imaging of the thoracic spine performed.   FINDINGS:  Osseous structures: Chronic mild L1 vertebral body compression fracture. Chronic minimal T11  inferior vertebral body compression fracture.  Alignment: Alignment maintained.  Spinal cord: No intrinsic spinal cord abnormality.  Paraspinal tissues: Unremarkable.  Contrast: None given.  Incidental: Trace right pleural effusion. Small seroma left breast.  DISC SPACES:  Incompletely characterized severe lower cervical spine DDD/facet arthrosis/uncovertebral joint spurring. Moderate left posterior lateral quadrants disc protrusion at T11-T12. Tiny scattered thoracic spine disc bulges/protrusions mostly concentrated in the upper thoracic spine.  PATIENT SURVEYS:  Modified Oswestry 19/50= 38% disability   SCREENING FOR RED FLAGS: Bowel or bladder incontinence: No Spinal tumors: No Cauda equina syndrome: No Compression fracture: Yes: closed L1 compression fracture Abdominal aneurysm: No  COGNITION:  Overall cognitive status: Within functional limits for tasks assessed     SENSATION: No numbness or parasthesias  MUSCLE LENGTH: Hamstrings: Right 90 deg; Left 90 deg  POSTURE: rounded shoulders, forward head, decreased lumbar lordosis, and increased thoracic kyphosis, forward flexed posture   PALPATION: Tenderness throughout lumbar paraspinals, QL, glutes, piriformis bil.  Decreased mobility throughout lumbar spine, decreased lordotic curvature but no scoliosis.   LUMBAR ROM:   Active  AROM  eval  Flexion To knees  Extension To neutral *  Right lateral flexion To knees *  Left lateral flexion To knees *  Right rotation WNL  Left rotation WNL   (Blank rows = not tested)  *increased pain  LOWER EXTREMITY ROM:    good hip mobility bil for IR/ER  LOWER EXTREMITY MMT:    MMT Right* eval Left* eval  Hip flexion 4+ 4+  Hip extension 5 5  Hip abduction 5 5  Hip adduction 5 5  Knee flexion 5 5  Knee extension 5 5  Ankle dorsiflexion 5 5  Ankle plantarflexion 5 5   (Blank rows = not tested) *tested in sitting  LUMBAR SPECIAL TESTS:  Straight leg raise test: Negative  and FABER test: Negative  FUNCTIONAL TESTS:  5 times sit to stand: 15 seconds  GAIT: Distance walked: 53' Assistive device utilized: None Level of assistance: Complete Independence Comments: forward flexed posture with gait.      TODAY'S TREATMENT  11/24/21 - see patient education   PATIENT EDUCATION:  Education details: findings, POC, education on DN and precautions, education on anatomy and lumbar stenosis.  Person educated: Patient, Spouse, and Child(ren) Education method: Explanation Education comprehension: verbalized understanding   HOME EXERCISE PROGRAM: TBD  ASSESSMENT:  CLINICAL IMPRESSION: Heidi Lutz  is an 81 y.o. female who was seen today for physical therapy evaluation and treatment for lumbar stenosis.  She reports over 50 years of chronic low back pain and interventions for pain including all conservative measures but not back surgery.  She is here today inquiring about dry needling.  She demonstrates symptoms consistent with lumbar stenosis (no pain with flexion or sitting, increased pain with standing/walking relieved by sitting).   Discussed that dry needling would not necessarily improve these symptoms, but she does have pain with palpation throughout lumbar and gluteal musculature that may benefit from manual therapy including dry needling, along with core strengthening focusing on flexion (posterior pelvic tilt, incline walking on treadmill) to improve activity tolerance.  Heidi Lutz would benefit from skilled physical therapy to decrease low back pain and improve activity tolerance.     OBJECTIVE IMPAIRMENTS decreased activity tolerance, difficulty walking, decreased strength, increased muscle spasms, improper body mechanics, postural dysfunction, and pain.   ACTIVITY LIMITATIONS lifting, standing, stairs, and locomotion level  PARTICIPATION LIMITATIONS: meal prep, cleaning, and community activity  PERSONAL FACTORS Time since onset of  injury/illness/exacerbation and 3+ comorbidities: lumbar stenosis, RA, OA, Raynauds, history breast cancer, abdominal hysterectomy  are also affecting patient's functional outcome.   REHAB POTENTIAL: Good  CLINICAL DECISION MAKING: Evolving/moderate complexity  EVALUATION COMPLEXITY: Moderate   GOALS: Goals reviewed with patient? Yes  SHORT TERM GOALS: Target date: 12/08/2021   Patient will be independent with initial HEP.  Baseline: needs Goal status: INITIAL    LONG TERM GOALS: Target date: 01/05/2022    Patient will be independent with advanced/ongoing HEP to improve outcomes and carryover.  Baseline: needs progression  Goal status: INITIAL  2.  Patient will report 50% improvement in low back pain to improve QOL.  Baseline: 6-25/10 low back pain Goal status: INITIAL  3.  Patient will demonstrate full pain free lumbar ROM to perform ADLs.   Baseline: *** Goal status: {GOALSTATUS:25110}  4.  Patient will demonstrate improved functional strength as demonstrated by ***. Baseline: *** Goal status: {GOALSTATUS:25110}  5.  Patient will report *** on lumbar FOTO to demonstrate improved functional ability.  Baseline: *** Goal status: {GOALSTATUS:25110}   6.  Patient will tolerate 15 min of standing to perform ADLs. Baseline: *** Goal status: {GOALSTATUS:25110}  7.  Patient to demonstrate ability to achieve and maintain good spinal alignment/posturing and body mechanics needed for daily activities. Baseline: *** Goal status: {GOALSTATUS:25110}  8. Patient will *** Baseline: *** Goal status: {GOALSTATUS:25110}    PLAN: PT FREQUENCY: 1-2x/week  PT DURATION: 6 weeks  PLANNED INTERVENTIONS: Therapeutic exercises, Therapeutic activity, Neuromuscular re-education, Balance training, Gait training, Patient/Family education, Self Care, Joint mobilization, Dry Needling, Electrical stimulation, Spinal mobilization, Manual therapy, and Re-evaluation.  PLAN FOR NEXT  SESSION: ***   Rennie Natter, PT, DPT  11/24/2021, 6:42 PM

## 2021-11-30 ENCOUNTER — Ambulatory Visit: Payer: Medicare PPO | Admitting: Physical Therapy

## 2021-11-30 ENCOUNTER — Encounter: Payer: Self-pay | Admitting: Physical Therapy

## 2021-11-30 DIAGNOSIS — M6281 Muscle weakness (generalized): Secondary | ICD-10-CM

## 2021-11-30 DIAGNOSIS — M5459 Other low back pain: Secondary | ICD-10-CM

## 2021-11-30 DIAGNOSIS — R252 Cramp and spasm: Secondary | ICD-10-CM

## 2021-11-30 NOTE — Patient Instructions (Signed)

## 2021-11-30 NOTE — Therapy (Signed)
OUTPATIENT PHYSICAL THERAPY TREATMENT   Patient Name: Heidi Lutz MRN: [id number] DOB:September 30, 1940, 81 y.o., female Today's Date: 11/30/2021   PT End of Session - 11/30/21 1101     Visit Number 2    Number of Visits 12    Date for PT Re-Evaluation 01/05/22    Authorization Type [employer] MCR    Progress Note Due on Visit 10    PT Start Time 1101    PT Stop Time 9390    PT Time Calculation (min) 44 min    Activity Tolerance Patient tolerated treatment well    Behavior During Therapy [facility] for tasks assessed/performed             Past Medical History:  Diagnosis Date   Cancer ([place]) 01/2017   left breast cancer, basal cell and 1 melanoma   Complication of anesthesia    heart rate spikeed in the PACU on her 2nd knee surgery   Environmental allergies    High cholesterol    OAB (overactive bladder) 02/2020   Osteoarthritis    Personal history of radiation therapy    PONV (postoperative nausea and vomiting)    with 1st knee surgery had n/v, none since   Raynaud disease    Raynauds disease-takes procardia   Rheumatoid arthritis ([place])    Past Surgical History:  Procedure Laterality Date   ABDOMINAL HYSTERECTOMY  1972   APPENDECTOMY  1953   BREAST BIOPSY Left 05/19/2020   benign   BREAST LUMPECTOMY Left 2019   BREAST LUMPECTOMY WITH RADIOACTIVE SEED AND SENTINEL LYMPH NODE BIOPSY Left 04/07/2017   Procedure: BREAST LUMPECTOMY WITH RADIOACTIVE SEED AND SENTINEL LYMPH NODE BIOPSY;  Surgeon: Erroll Luna, MD;  Location: [place];  Service: General;  Laterality: Left;   BUNIONECTOMY  1999   COLONOSCOPY     EYE SURGERY     bil cataract   GANGLION CYST EXCISION  1968   JOINT REPLACEMENT Left 2017    reverse shoulder replacement   REPLACEMENT TOTAL KNEE  2012, 2013   Right and Left   SHOULDER ARTHROSCOPY Left    [place]   Patient Active Problem List   Diagnosis Date Noted   Rheumatoid arthritis ([place])    Raynaud disease    PONV  (postoperative nausea and vomiting)    Personal history of radiation therapy    Osteoarthritis    High cholesterol    Environmental allergies    Complication of anesthesia    OAB (overactive bladder) 02/2020   Age-related osteoporosis without current pathological fracture 10/31/2019   History of vertebral compression fracture 07/16/2019   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   Pleuritic chest pain 04/10/2018   GAD (generalized anxiety disorder) 04/25/2017   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   LBBB (left bundle branch block) 03/22/2017   Pre-operative cardiovascular examination 03/22/2017   Mixed hyperlipidemia 03/22/2017   Malignant neoplasm of central portion of left breast in female, estrogen receptor positive ([place]) 03/16/2017   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/2017   Osteopenia 03/03/2017   Cancer ([place]) 01/2017   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   Lumbar degenerative disc disease 02/27/2015   Lumbar spinal stenosis 02/27/2015   Osteoarthritis of spine with radiculopathy, lumbar region 02/27/2015   Basal cell carcinoma 12/15/2014   Closed compression fracture of L1 lumbar vertebra, with routine healing, subsequent encounter 11/26/2013   Insomnia 02/02/2010   Hyperlipidemia 04/22/2009   Essential hypertension 09/19/2008  Allergic rhinitis 07/31/2007   IBS (irritable bowel syndrome) 08/18/2006   Raynaud's disease 08/18/2006    PCP: [name], MD  REFERRING PROVIDER: [name], MD  REFERRING DIAG: [phone] (ICD-10-CM) - Lumbar stenosis  Rationale for Evaluation and Treatment Rehabilitation  THERAPY DIAG:  Other low back pain  Cramp and spasm  Muscle weakness (generalized)  ONSET DATE: chronic since age 2  SUBJECTIVE:                                                                                                                                                                                            SUBJECTIVE STATEMENT: Heidi Lutz reports no changes since IE.  Accompanied by daughter and husband.  She is very excited to trial dry needling.   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  PAIN:  Are you having pain? Yes: NPRS scale: 6/10 Pain location: low back  Pain description: constant pain also gets "screaming meemees" 25/10 Aggravating factors: pressure/weather changes, walking more than 10 min, prolonged standing >10 min  Relieving factors: sitting down   PRECAUTIONS: None  WEIGHT BEARING RESTRICTIONS No  FALLS:  Has patient fallen in last 6 months? No  LIVING ENVIRONMENT: Lives with: lives with their spouse Lives in: House/apartment Stairs:  3rd floor apartment, lives in [place] Has following equipment at home: Gilford Rile - 2 wheeled  OCCUPATION: retired  PLOF: Independent  PATIENT GOALS improve pain   OBJECTIVE:   DIAGNOSTIC FINDINGS:  04/24/2019 COMPARISON: December 2016. September 2018.    INDICATION: Wedge compression fracture of unspecified thoracic vertebra, initial encounter for closed fracture (#).   TECHNIQUE: MRI SPINE THORACIC WO IV CONTRAST-- Multiplanar, multisequence MR imaging of the thoracic spine performed.   FINDINGS:  Osseous structures: Chronic mild L1 vertebral body compression fracture. Chronic minimal T11 inferior vertebral body compression fracture.  Alignment: Alignment maintained.  Spinal cord: No intrinsic spinal cord abnormality.  Paraspinal tissues: Unremarkable.  Contrast: None given.  Incidental: Trace right pleural effusion. Small seroma left breast.   DISC SPACES:  Incompletely characterized severe lower  cervical spine DDD/facet arthrosis/uncovertebral joint spurring. Moderate left posterior lateral quadrants disc protrusion at T11-T12. Tiny scattered thoracic spine disc bulges/protrusions mostly concentrated in the upper thoracic spine.  PATIENT SURVEYS:  Modified Oswestry 19/50= 38% disability   SCREENING FOR RED FLAGS: Bowel or bladder incontinence: No Spinal tumors: No  Cauda equina syndrome: No Compression fracture: Yes: closed L1 compression fracture Abdominal aneurysm: No  COGNITION:  Overall cognitive status: Within functional limits for tasks assessed     SENSATION: No numbness or parasthesias  MUSCLE LENGTH: Hamstrings: Right 90 deg; Left 90 deg  POSTURE: rounded shoulders, forward head, decreased lumbar lordosis, and increased thoracic kyphosis, forward flexed posture   PALPATION: Tenderness throughout lumbar paraspinals, QL, glutes, piriformis bil.  Decreased mobility throughout lumbar spine, decreased lordotic curvature but no scoliosis.   LUMBAR ROM:   Active  AROM  eval  Flexion To knees  Extension To neutral *  Right lateral flexion To knees *  Left lateral flexion To knees *  Right rotation WNL  Left rotation WNL   (Blank rows = not tested)  *increased pain  LOWER EXTREMITY ROM:    good hip mobility bil for IR/ER  LOWER EXTREMITY MMT:    MMT Right* eval Left* eval  Hip flexion 4+ 4+  Hip extension 5 5  Hip abduction 5 5  Hip adduction 5 5  Knee flexion 5 5  Knee extension 5 5  Ankle dorsiflexion 5 5  Ankle plantarflexion 5 5   (Blank rows = not tested) *tested in sitting  LUMBAR SPECIAL TESTS:  Straight leg raise test: Negative and FABER test: Negative  FUNCTIONAL TESTS:  5 times sit to stand: 15 seconds  GAIT: Distance walked: 9' Assistive device utilized: None Level of assistance: Complete Independence Comments: forward flexed posture with gait.      TODAY'S TREATMENT  11/30/2021 Therapeutic Exercise: to improve strength and  mobility.  Demo, verbal and tactile cues throughout for technique. Treadmill x 6 min 0.3 mph at incline 4 Manual Therapy: to decrease muscle spasm and pain and improve mobility.  Placed prone over pillows for comfort for back.  STM/TPR to lumbar paraspinals, R UPA mobs, STM/TPR to R glutes and piriformis, skilled palpation and monitoring during dry needling. Trigger Point Dry-Needling  Treatment instructions: Expect mild to moderate muscle soreness. S/S of pneumothorax if dry needled over a lung field, and to seek immediate medical attention should they occur. Patient verbalized understanding of these instructions and education.  Patient Consent Given: Yes Education handout provided: Yes Muscles treated: R lumbar multifidi L2-L5, R glut med, R piriformis Electrical stimulation performed: No Parameters: N/A Treatment response/outcome: Twitch Response Elicited and Palpable Increase in Muscle Length  11/24/21 - see patient education   PATIENT EDUCATION:  Education details: DN Person educated: Patient, Spouse, and Child(ren) Education method: Explanation Education comprehension: verbalized understanding   HOME EXERCISE PROGRAM: TBD  ASSESSMENT:  CLINICAL IMPRESSION: Heidi Lutz  reports continued constant LBP.  She trialed walking on incline today on treadmill x 6 minutes without increased pain, followed by manual therapy and TrDN to R lumbar multifidi, glutes and piriformis.  She tolerated very well, and reported no soreness at end of session.  She understands that she may have soreness following TrDN, encouraged to try to stay active today in pain free ROM.  Heidi Lutz would benefit from skilled physical therapy to decrease low back pain and improve activity tolerance.     OBJECTIVE IMPAIRMENTS decreased activity tolerance, difficulty walking, decreased strength, increased muscle spasms, improper body mechanics, postural dysfunction, and pain.   ACTIVITY LIMITATIONS  lifting, standing, stairs, and locomotion level  PARTICIPATION LIMITATIONS: meal prep, cleaning, and community activity  PERSONAL FACTORS Time since onset of injury/illness/exacerbation and 3+ comorbidities: lumbar stenosis, RA, OA, Raynauds, history breast cancer, abdominal hysterectomy  are also affecting patient's functional outcome.   REHAB POTENTIAL: Good  CLINICAL DECISION MAKING: Evolving/moderate complexity  EVALUATION COMPLEXITY: Moderate   GOALS: Goals reviewed with patient? Yes  SHORT TERM GOALS: Target date: 12/08/2021   Patient will be independent with initial HEP.  Baseline: needs Goal status: IN PROGRESS    LONG TERM GOALS: Target date: 01/05/2022    Patient will be independent with advanced/ongoing HEP to improve outcomes and carryover.  Baseline: needs progression  Goal status: IN PROGRESS  2.  Patient will report 50% improvement in low back pain to improve QOL.  Baseline: 6-25/10 low back pain Goal status: IN PROGRESS  3.  Patient will be able to stand/walk with upright posture without increased LBP.   Baseline: forward flexed posture due to pian Goal status: IN PROGRESS  4.   Patient will report 12% improvement on Oswestry to demonstrate improved functional ability.  Baseline: 38% disability Goal status: IN PROGRESS   5.  Patient will tolerate 15 min of standing to perform ADLs. Baseline: 10 minutes before needing to sit Goal status: IN PROGRESS  6.  Patient will tolerate 15 min walking on treadmill to improve activity tolerance and exercise.  Baseline: 10 min max Goal status: IN PROGRESS  PLAN: PT FREQUENCY: 1-2x/week  PT DURATION: 6 weeks  PLANNED INTERVENTIONS: Therapeutic exercises, Therapeutic activity, Neuromuscular re-education, Balance training, Gait training, Patient/Family education, Self Care, Joint mobilization, Dry Needling, Electrical stimulation, Spinal mobilization, Manual therapy, and Re-evaluation.  PLAN FOR NEXT SESSION:  review core strengthening focusing on flexion/neutral and TrA activation, manual therapy/TrDN to lumbar multifidi/glutes/QL   Rennie Natter, PT, DPT  11/30/2021, 12:05 PM

## 2021-12-03 ENCOUNTER — Other Ambulatory Visit: Payer: Self-pay | Admitting: *Deleted

## 2021-12-03 MED ORDER — TAMOXIFEN CITRATE 20 MG PO TABS: 20.0000 mg | ORAL_TABLET | Freq: Every day | ORAL | 4 refills | Status: DC

## 2021-12-07 ENCOUNTER — Encounter: Payer: Medicare PPO | Admitting: Physical Therapy

## 2021-12-14 ENCOUNTER — Ambulatory Visit: Payer: Medicare PPO | Admitting: Physical Therapy

## 2021-12-14 ENCOUNTER — Encounter: Payer: Self-pay | Admitting: Physical Therapy

## 2021-12-14 DIAGNOSIS — M5459 Other low back pain: Secondary | ICD-10-CM | POA: Diagnosis not present

## 2021-12-14 DIAGNOSIS — R252 Cramp and spasm: Secondary | ICD-10-CM

## 2021-12-14 DIAGNOSIS — M6281 Muscle weakness (generalized): Secondary | ICD-10-CM

## 2021-12-14 NOTE — Therapy (Signed)
OUTPATIENT PHYSICAL THERAPY TREATMENT   Patient Name: Heidi Lutz MRN: [id number] DOB:22-Feb-1941, 81 y.o., female Today's Date: 12/14/2021   PT End of Session - 12/14/21 1411     Visit Number 3    Number of Visits 12    Date for PT Re-Evaluation 01/05/22    Authorization Type [employer] MCR    Progress Note Due on Visit 10    PT Start Time 1401    PT Stop Time 5643    PT Time Calculation (min) 44 min    Activity Tolerance Patient tolerated treatment well    Behavior During Therapy [facility] for tasks assessed/performed             Past Medical History:  Diagnosis Date   Cancer ([place]) 01/2017   left breast cancer, basal cell and 1 melanoma   Complication of anesthesia    heart rate spikeed in the PACU on her 2nd knee surgery   Environmental allergies    High cholesterol    OAB (overactive bladder) 02/2020   Osteoarthritis    Personal history of radiation therapy    PONV (postoperative nausea and vomiting)    with 1st knee surgery had n/v, none since   Raynaud disease    Raynauds disease-takes procardia   Rheumatoid arthritis ([place])    Past Surgical History:  Procedure Laterality Date   ABDOMINAL HYSTERECTOMY  1972   APPENDECTOMY  1953   BREAST BIOPSY Left 05/19/2020   benign   BREAST LUMPECTOMY Left 2019   BREAST LUMPECTOMY WITH RADIOACTIVE SEED AND SENTINEL LYMPH NODE BIOPSY Left 04/07/2017   Procedure: BREAST LUMPECTOMY WITH RADIOACTIVE SEED AND SENTINEL LYMPH NODE BIOPSY;  Surgeon: Erroll Luna, MD;  Location: [place];  Service: General;  Laterality: Left;   BUNIONECTOMY  1999   COLONOSCOPY     EYE SURGERY     bil cataract   GANGLION CYST EXCISION  1968   JOINT REPLACEMENT Left 2017    reverse shoulder replacement   REPLACEMENT TOTAL KNEE  2012, 2013   Right and Left   SHOULDER ARTHROSCOPY Left    [place]   Patient Active Problem List   Diagnosis Date Noted   Rheumatoid arthritis ([place])    Raynaud disease    PONV  (postoperative nausea and vomiting)    Personal history of radiation therapy    Osteoarthritis    High cholesterol    Environmental allergies    Complication of anesthesia    OAB (overactive bladder) 02/2020   Age-related osteoporosis without current pathological fracture 10/31/2019   History of vertebral compression fracture 07/16/2019   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   Pleuritic chest pain 04/10/2018   GAD (generalized anxiety disorder) 04/25/2017   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   LBBB (left bundle branch block) 03/22/2017   Pre-operative cardiovascular examination 03/22/2017   Mixed hyperlipidemia 03/22/2017   Malignant neoplasm of central portion of left breast in female, estrogen receptor positive ([place]) 03/16/2017   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/2017   Osteopenia 03/03/2017   Cancer ([place]) 01/2017   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   Lumbar degenerative disc disease 02/27/2015   Lumbar spinal stenosis 02/27/2015   Osteoarthritis of spine with radiculopathy, lumbar region 02/27/2015   Basal cell carcinoma 12/15/2014   Closed compression fracture of L1 lumbar vertebra, with routine healing, subsequent encounter 11/26/2013   Insomnia 02/02/2010   Hyperlipidemia 04/22/2009   Essential hypertension 09/19/2008  Allergic rhinitis 07/31/2007   IBS (irritable bowel syndrome) 08/18/2006   Raynaud's disease 08/18/2006    PCP: [name], MD  REFERRING PROVIDER: [name], MD  REFERRING DIAG: [phone] (ICD-10-CM) - Lumbar stenosis  Rationale for Evaluation and Treatment Rehabilitation  THERAPY DIAG:  Other low back pain  Cramp and spasm  Muscle weakness (generalized)  ONSET DATE: chronic since age 7  SUBJECTIVE:                                                                                                                                                                                            SUBJECTIVE STATEMENT: Heidi Lutz reports she felt wonderful for 2 days following DN.    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  PAIN:  Are you having pain? Yes: NPRS scale: 3/10 Pain location: low back  Pain description: constant pain also gets "screaming meemees" 25/10 Aggravating factors: pressure/weather changes, walking more than 10 min, prolonged standing >10 min  Relieving factors: sitting down   PRECAUTIONS: None  WEIGHT BEARING RESTRICTIONS No  FALLS:  Has patient fallen in last 6 months? No  LIVING ENVIRONMENT: Lives with: lives with their spouse Lives in: House/apartment Stairs:  3rd floor apartment, lives in [place] Has following equipment at home: Gilford Rile - 2 wheeled  OCCUPATION: retired  PLOF: Independent  PATIENT GOALS improve pain   OBJECTIVE:   DIAGNOSTIC FINDINGS:  04/24/2019 COMPARISON: December 2016. September 2018.    INDICATION: Wedge compression fracture of unspecified thoracic vertebra, initial encounter for closed fracture (#).   TECHNIQUE: MRI SPINE THORACIC WO IV CONTRAST-- Multiplanar, multisequence MR imaging of the thoracic spine performed.   FINDINGS:  Osseous structures: Chronic mild L1 vertebral body compression fracture. Chronic minimal T11 inferior vertebral body compression fracture.  Alignment: Alignment maintained.  Spinal cord: No intrinsic spinal cord abnormality.  Paraspinal tissues: Unremarkable.  Contrast: None given.  Incidental: Trace right pleural effusion. Small seroma left breast.   DISC SPACES:  Incompletely characterized severe lower cervical spine DDD/facet arthrosis/uncovertebral joint spurring.  Moderate left posterior lateral quadrants disc protrusion at T11-T12. Tiny scattered thoracic spine disc bulges/protrusions mostly concentrated in the upper thoracic spine.  PATIENT SURVEYS:  Modified Oswestry 19/50= 38% disability   SCREENING FOR RED FLAGS: Bowel or bladder incontinence: No Spinal tumors: No Cauda equina syndrome: No Compression fracture: Yes: closed L1 compression  fracture Abdominal aneurysm: No  COGNITION:  Overall cognitive status: Within functional limits for tasks assessed     SENSATION: No numbness or parasthesias  MUSCLE LENGTH: Hamstrings: Right 90 deg; Left 90 deg  POSTURE: rounded shoulders, forward head, decreased lumbar lordosis, and increased thoracic kyphosis, forward flexed posture   PALPATION: Tenderness throughout lumbar paraspinals, QL, glutes, piriformis bil.  Decreased mobility throughout lumbar spine, decreased lordotic curvature but no scoliosis.   LUMBAR ROM:   Active  AROM  eval  Flexion To knees  Extension To neutral *  Right lateral flexion To knees *  Left lateral flexion To knees *  Right rotation WNL  Left rotation WNL   (Blank rows = not tested)  *increased pain  LOWER EXTREMITY ROM:    good hip mobility bil for IR/ER  LOWER EXTREMITY MMT:    MMT Right* eval Left* eval  Hip flexion 4+ 4+  Hip extension 5 5  Hip abduction 5 5  Hip adduction 5 5  Knee flexion 5 5  Knee extension 5 5  Ankle dorsiflexion 5 5  Ankle plantarflexion 5 5   (Blank rows = not tested) *tested in sitting  LUMBAR SPECIAL TESTS:  Straight leg raise test: Negative and FABER test: Negative  FUNCTIONAL TESTS:  5 times sit to stand: 15 seconds  GAIT: Distance walked: 76' Assistive device utilized: None Level of assistance: Complete Independence Comments: forward flexed posture with gait.      TODAY'S TREATMENT  12/14/2021 Therapeutic Exercise: to improve strength and mobility.  Demo, verbal and tactile cues throughout for  technique. Treadmill x 5 min 4.0 grad, 1.2 mph Seated pelvic tilts x 10  Supine posterior pelvic tilts x 10  Supine marches with TrA x 10 LTR x 10 small ROM Manual Therapy: to decrease muscle spasm and pain and improve mobility STM/TPR to lumbar paraspinals, R UPA mobs, STM/TPR to bil  glutes and piriformis, skilled palpation and monitoring during dry needling. Trigger Point Dry-Needling  Treatment instructions: Expect mild to moderate muscle soreness. S/S of pneumothorax if dry needled over a lung field, and to seek immediate medical attention should they occur. Patient verbalized understanding of these instructions and education.  Patient Consent Given: Yes Education handout provided: Yes Muscles treated: R lumbar multifidi L2-L5, bil glut med, bil  piriformis Electrical stimulation performed: No Parameters: N/A Treatment response/outcome: Twitch Response Elicited and Palpable Increase in Muscle Length    11/30/2021 Therapeutic Exercise: to improve strength and mobility.  Demo, verbal and tactile cues throughout for technique. Treadmill x 6 min 0.3 mph at incline 4 Manual Therapy: to decrease muscle spasm and pain and improve mobility.  Placed prone over pillows for comfort for back.  STM/TPR to lumbar paraspinals, R UPA mobs, STM/TPR to R glutes and piriformis, skilled palpation and monitoring during dry needling. Trigger Point Dry-Needling  Treatment instructions: Expect mild to moderate muscle soreness. S/S of pneumothorax if dry needled over a lung field, and to seek immediate medical attention should they occur. Patient verbalized understanding of these instructions and education.  Patient Consent Given: Yes Education handout provided: Yes Muscles treated: R lumbar multifidi L2-L5, R glut med, R piriformis Electrical stimulation performed: No Parameters: N/A Treatment response/outcome: Twitch Response Elicited and Palpable Increase in Muscle Length  11/24/21 - see patient  education   PATIENT EDUCATION:  Education details: HEP Person educated: Patient and Child(ren) Education method: Explanation, Demonstration, Verbal cues, and Handouts Education comprehension: verbalized understanding and returned demonstration   HOME EXERCISE PROGRAM: Access  Code: [email or web address] URL: [email or web address] Date: 12/14/2021 Prepared by: Glenetta Hew  Exercises - Supine Lower Trunk Rotation  - 1 x daily - 7 x weekly - 2 sets - 10 reps - Supine Posterior Pelvic Tilt  - 1 x daily - 7 x weekly - 2 sets - 10 reps - Seated Posterior Pelvic Tilt  - 1 x daily - 7 x weekly - 2 sets - 10 reps  ASSESSMENT:  CLINICAL IMPRESSION: Heidi Lutz  reports good results from initial trial of TrDN.  Today initiated exercises for core strengthening, focusing on neutral spine, she tolerated very well, followed by continued manual therapy and TrDN, including L piriformis and glutes as this side had tenderness and trigger points as well.  Reported decreased pain following interventions.  Heidi Lutz would benefit from skilled physical therapy to decrease low back pain and improve activity tolerance.     OBJECTIVE IMPAIRMENTS decreased activity tolerance, difficulty walking, decreased strength, increased muscle spasms, improper body mechanics, postural dysfunction, and pain.   ACTIVITY LIMITATIONS lifting, standing, stairs, and locomotion level  PARTICIPATION LIMITATIONS: meal prep, cleaning, and community activity  PERSONAL FACTORS Time since onset of injury/illness/exacerbation and 3+ comorbidities: lumbar stenosis, RA, OA, Raynauds, history breast cancer, abdominal hysterectomy  are also affecting patient's functional outcome.   REHAB POTENTIAL: Good  CLINICAL DECISION MAKING: Evolving/moderate complexity  EVALUATION COMPLEXITY: Moderate   GOALS: Goals reviewed with patient? Yes  SHORT TERM GOALS: Target date: 12/08/2021   Patient will be  independent with initial HEP.  Baseline: needs Goal status: IN PROGRESS 12/14/21- given    LONG TERM GOALS: Target date: 01/05/2022    Patient will be independent with advanced/ongoing HEP to improve outcomes and carryover.  Baseline: needs progression  Goal status: IN PROGRESS  2.  Patient will report 50% improvement in low back pain to improve QOL.  Baseline: 6-25/10 low back pain Goal status: IN PROGRESS  3.  Patient will be able to stand/walk with upright posture without increased LBP.   Baseline: forward flexed posture due to pian Goal status: IN PROGRESS  4.   Patient will report 12% improvement on Oswestry to demonstrate improved functional ability.  Baseline: 38% disability Goal status: IN PROGRESS   5.  Patient will tolerate 15 min of standing to perform ADLs. Baseline: 10 minutes before needing to sit Goal status: IN PROGRESS  6.  Patient will tolerate 15 min walking on treadmill to improve activity tolerance and exercise.  Baseline: 10 min max Goal status: IN PROGRESS  PLAN: PT FREQUENCY: 1-2x/week  PT DURATION: 6 weeks  PLANNED INTERVENTIONS: Therapeutic exercises, Therapeutic activity, Neuromuscular re-education, Balance training, Gait training, Patient/Family education, Self Care, Joint mobilization, Dry Needling, Electrical stimulation, Spinal mobilization, Manual therapy, and Re-evaluation.  PLAN FOR NEXT SESSION: review core strengthening focusing on flexion/neutral and TrA activation, manual therapy/TrDN to lumbar multifidi/glutes/QL   Rennie Natter, PT, DPT  12/14/2021, 6:20 PM

## 2021-12-16 ENCOUNTER — Telehealth (HOSPITAL_BASED_OUTPATIENT_CLINIC_OR_DEPARTMENT_OTHER): Payer: Self-pay

## 2021-12-16 ENCOUNTER — Other Ambulatory Visit (HOSPITAL_BASED_OUTPATIENT_CLINIC_OR_DEPARTMENT_OTHER): Payer: Self-pay | Admitting: Family Medicine

## 2021-12-16 DIAGNOSIS — R053 Chronic cough: Secondary | ICD-10-CM

## 2021-12-17 ENCOUNTER — Encounter: Payer: Self-pay | Admitting: Physical Therapy

## 2021-12-17 ENCOUNTER — Ambulatory Visit: Payer: Medicare PPO | Admitting: Physical Therapy

## 2021-12-17 DIAGNOSIS — R252 Cramp and spasm: Secondary | ICD-10-CM

## 2021-12-17 DIAGNOSIS — M5459 Other low back pain: Secondary | ICD-10-CM

## 2021-12-17 DIAGNOSIS — M6281 Muscle weakness (generalized): Secondary | ICD-10-CM

## 2021-12-17 NOTE — Therapy (Signed)
OUTPATIENT PHYSICAL THERAPY TREATMENT   Patient Name: Torianne Laflam MRN: [id number] DOB:1940/12/06, 81 y.o., female Today's Date: 12/17/2021   PT End of Session - 12/17/21 1031     Visit Number 4    Number of Visits 12    Date for PT Re-Evaluation 01/05/22    Authorization Type [employer] M[employer]    Progress Note Due on Visit 10    PT Start Time 0962    PT Stop Time 1057    PT Time Calculation (min) 42 min    Activity Tolerance Patient tolerated treatment well    Behavior During Therapy WFL for tasks assessed/performed             Past Medical History:  Diagnosis Date   Cancer ([place]) 01/2017   left breast cancer, basal cell and 1 melanoma   Complication of anesthesia    heart rate spikeed in the PACU on her 2nd knee surgery   Environmental allergies    High cholesterol    OAB (overactive bladder) 02/2020   Osteoarthritis    Personal history of radiation therapy    PONV (postoperative nausea and vomiting)    with 1st knee surgery had n/v, none since   Raynaud disease    Raynauds disease-takes procardia   Rheumatoid arthritis ([place])    Past Surgical History:  Procedure Laterality Date   ABDOMINAL HYSTERECTOMY  1972   APPENDECTOMY  1953   BREAST BIOPSY Left 05/19/2020   benign   BREAST LUMPECTOMY Left 2019   BREAST LUMPECTOMY WITH RADIOACTIVE SEED AND SENTINEL LYMPH NODE BIOPSY Left 04/07/2017   Procedure: BREAST LUMPECTOMY WITH RADIOACTIVE SEED AND SENTINEL LYMPH NODE BIOPSY;  Surgeon: Erroll Luna, MD;  Location: [facility];  Service: General;  Laterality: Left;   BUNIONECTOMY  1999   COLONOSCOPY     EYE SURGERY     bil cataract   GANGLION CYST EXCISION  1968   JOINT REPLACEMENT Left 2017    reverse shoulder replacement   REPLACEMENT TOTAL KNEE  2012, 2013   Right and Left   SHOULDER ARTHROSCOPY Left    [place]   Patient Active Problem List   Diagnosis Date Noted   Rheumatoid arthritis ([place])    Raynaud disease    PONV  (postoperative nausea and vomiting)    Personal history of radiation therapy    Osteoarthritis    High cholesterol    Environmental allergies    Complication of anesthesia    OAB (overactive bladder) 02/2020   Age-related osteoporosis without current pathological fracture 10/31/2019   History of vertebral compression fracture 07/16/2019   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   Pleuritic chest pain 04/10/2018   GAD (generalized anxiety disorder) 04/25/2017   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   LBBB (left bundle branch block) 03/22/2017   Pre-operative cardiovascular examination 03/22/2017   Mixed hyperlipidemia 03/22/2017   Malignant neoplasm of central portion of left breast in female, estrogen receptor positive ([place]) 03/16/2017   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/2017   Osteopenia 03/03/2017   Cancer ([place]) 01/2017   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   Lumbar degenerative disc disease 02/27/2015   Lumbar spinal stenosis 02/27/2015   Osteoarthritis of spine with radiculopathy, lumbar region 02/27/2015   Basal cell carcinoma 12/15/2014   Closed compression fracture of L1 lumbar vertebra, with routine healing, subsequent encounter 11/26/2013   Insomnia 02/02/2010   Hyperlipidemia 04/22/2009   Essential hypertension 09/19/2008  Allergic rhinitis 07/31/2007   IBS (irritable bowel syndrome) 08/18/2006   Raynaud's disease 08/18/2006    PCP: [name], MD  REFERRING PROVIDER: [name], MD  REFERRING DIAG: [phone] (ICD-10-CM) - Lumbar stenosis  Rationale for Evaluation and Treatment Rehabilitation  THERAPY DIAG:  Other low back pain  Cramp and spasm  Muscle weakness (generalized)  ONSET DATE: chronic since age 23  SUBJECTIVE:                                                                                                                                                                                            SUBJECTIVE STATEMENT: Priscille Shadduck reports she feels that interventions are helping significantly, and while sore yesterday after being very active (3000 steps total for day), she is feeling good today.   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  PAIN:  Are you having pain? Yes: NPRS scale: 2/10 Pain location: low back  Pain description: constant pain also gets "screaming meemees" 25/10 Aggravating factors: pressure/weather changes, walking more than 10 min, prolonged standing >10 min  Relieving factors: sitting down   PRECAUTIONS: None  WEIGHT BEARING RESTRICTIONS No  FALLS:  Has patient fallen in last 6 months? No  LIVING ENVIRONMENT: Lives with: lives with their spouse Lives in: House/apartment Stairs:  3rd floor apartment, lives in [place] Has following equipment at home: Gilford Rile - 2 wheeled  OCCUPATION: retired  PLOF: Independent  PATIENT GOALS improve pain   OBJECTIVE:   DIAGNOSTIC FINDINGS:  04/24/2019 COMPARISON: December 2016. September 2018.    INDICATION: Wedge compression fracture of unspecified thoracic vertebra, initial encounter for closed fracture (#).   TECHNIQUE: MRI SPINE THORACIC WO IV CONTRAST-- Multiplanar, multisequence MR imaging of the thoracic spine performed.   FINDINGS:  Osseous structures: Chronic mild L1 vertebral body compression fracture. Chronic minimal T11 inferior vertebral body compression fracture.  Alignment: Alignment maintained.  Spinal cord: No intrinsic spinal cord abnormality.  Paraspinal tissues: Unremarkable.  Contrast: None given.  Incidental: Trace right pleural effusion. Small seroma left breast.   DISC  SPACES:  Incompletely characterized severe lower cervical spine DDD/facet arthrosis/uncovertebral joint spurring. Moderate left posterior lateral quadrants disc protrusion at T11-T12. Tiny scattered thoracic spine disc bulges/protrusions mostly concentrated in the upper thoracic spine.  PATIENT SURVEYS:  Modified Oswestry 19/50= 38% disability   SCREENING FOR RED FLAGS: Bowel or  bladder incontinence: No Spinal tumors: No Cauda equina syndrome: No Compression fracture: Yes: closed L1 compression fracture Abdominal aneurysm: No  COGNITION:  Overall cognitive status: Within functional limits for tasks assessed     SENSATION: No numbness or parasthesias  MUSCLE LENGTH: Hamstrings: Right 90 deg; Left 90 deg  POSTURE: rounded shoulders, forward head, decreased lumbar lordosis, and increased thoracic kyphosis, forward flexed posture   PALPATION: Tenderness throughout lumbar paraspinals, QL, glutes, piriformis bil.  Decreased mobility throughout lumbar spine, decreased lordotic curvature but no scoliosis.   LUMBAR ROM:   Active  AROM  eval  Flexion To knees  Extension To neutral *  Right lateral flexion To knees *  Left lateral flexion To knees *  Right rotation WNL  Left rotation WNL   (Blank rows = not tested)  *increased pain  LOWER EXTREMITY ROM:    good hip mobility bil for IR/ER  LOWER EXTREMITY MMT:    MMT Right* eval Left* eval  Hip flexion 4+ 4+  Hip extension 5 5  Hip abduction 5 5  Hip adduction 5 5  Knee flexion 5 5  Knee extension 5 5  Ankle dorsiflexion 5 5  Ankle plantarflexion 5 5   (Blank rows = not tested) *tested in sitting  LUMBAR SPECIAL TESTS:  Straight leg raise test: Negative and FABER test: Negative  FUNCTIONAL TESTS:  5 times sit to stand: 15 seconds  GAIT: Distance walked: 59' Assistive device utilized: None Level of assistance: Complete Independence Comments: forward flexed posture with gait.      TODAY'S TREATMENT   12/17/2021 Therapeutic Exercise: to improve strength and mobility.  Demo, verbal and tactile cues throughout for technique. Treadmill x 6 min 4.0 grad, 1.0 mph Seated pelvic tilts x 10 with cues.  Manual Therapy: to decrease muscle spasm and pain and improve mobility STM/TPR to lumbar paraspinals, R UPA mobs, STM/TPR to bil  glutes and piriformis, skilled palpation and monitoring during dry needling. Trigger Point Dry-Needling  Treatment instructions: Expect mild to moderate muscle soreness. S/S of pneumothorax if dry needled over a lung field, and to seek immediate medical attention should they occur. Patient verbalized understanding of these instructions and education.  Patient Consent Given: Yes Education handout provided: Yes Muscles treated: R lumbar multifidi L2-L5, bil glut med, bil  piriformis Electrical stimulation performed: No Parameters: N/A Treatment response/outcome: Twitch Response Elicited and Palpable Increase in Muscle Length   12/14/2021 Therapeutic Exercise: to improve strength and mobility.  Demo, verbal and tactile cues throughout for technique. Treadmill x 5 min 4.0 grad, 1.2 mph Seated pelvic tilts x 10  Supine posterior pelvic tilts x 10  Supine marches with TrA x 10 LTR x 10 small ROM Manual Therapy: to decrease muscle spasm and pain and improve mobility STM/TPR to lumbar paraspinals, R UPA mobs, STM/TPR to bil  glutes and piriformis, skilled palpation and monitoring during dry needling. Trigger Point Dry-Needling  Treatment instructions: Expect mild to moderate muscle soreness. S/S of pneumothorax if dry needled over a lung field, and to seek immediate medical attention should they occur. Patient verbalized understanding of these instructions and education.  Patient Consent Given: Yes Education handout provided: Yes Muscles treated: R lumbar multifidi L2-L5, bil glut med, bil  piriformis Electrical stimulation performed: No Parameters: N/A Treatment  response/outcome: Twitch Response Elicited and Palpable Increase in Muscle Length    11/30/2021 Therapeutic Exercise: to improve strength and mobility.  Demo, verbal and tactile cues throughout for technique. Treadmill x 6 min 0.3 mph  at incline 4 Manual Therapy: to decrease muscle spasm and pain and improve mobility.  Placed prone over pillows for comfort for back.  STM/TPR to lumbar paraspinals, R UPA mobs, STM/TPR to R glutes and piriformis, skilled palpation and monitoring during dry needling. Trigger Point Dry-Needling  Treatment instructions: Expect mild to moderate muscle soreness. S/S of pneumothorax if dry needled over a lung field, and to seek immediate medical attention should they occur. Patient verbalized understanding of these instructions and education.  Patient Consent Given: Yes Education handout provided: Yes Muscles treated: R lumbar multifidi L2-L5, R glut med, R piriformis Electrical stimulation performed: No Parameters: N/A Treatment response/outcome: Twitch Response Elicited and Palpable Increase in Muscle Length  11/24/21 - see patient education   PATIENT EDUCATION:  Education details: continue HEP as tolerated Person educated: Patient and Child(ren) Education method: Explanation, Demonstration, Verbal cues, and Handouts Education comprehension: verbalized understanding and returned demonstration   HOME EXERCISE PROGRAM: Access Code: [email or web address] URL: [email or web address] Date: 12/14/2021 Prepared by: Glenetta Hew  Exercises - Supine Lower Trunk Rotation  - 1 x daily - 7 x weekly - 2 sets - 10 reps - Supine Posterior Pelvic Tilt  - 1 x daily - 7 x weekly - 2 sets - 10 reps - Seated Posterior Pelvic Tilt  - 1 x daily - 7 x weekly - 2 sets - 10 reps  ASSESSMENT:  CLINICAL IMPRESSION: Derrek Monaco  reports improved low back pain with interventions.  Tolerated 6 minutes of walking today before needing break.  She requested review of  seated pelvic tilts today, needed cues but no pain with exercise.   She tolerated manual therapy well and reported decreased pain afterwards.  Derrek Monaco would benefit from skilled physical therapy to decrease low back pain and improve activity tolerance.     OBJECTIVE IMPAIRMENTS decreased activity tolerance, difficulty walking, decreased strength, increased muscle spasms, improper body mechanics, postural dysfunction, and pain.   ACTIVITY LIMITATIONS lifting, standing, stairs, and locomotion level  PARTICIPATION LIMITATIONS: meal prep, cleaning, and community activity  PERSONAL FACTORS Time since onset of injury/illness/exacerbation and 3+ comorbidities: lumbar stenosis, RA, OA, Raynauds, history breast cancer, abdominal hysterectomy  are also affecting patient's functional outcome.   REHAB POTENTIAL: Good  CLINICAL DECISION MAKING: Evolving/moderate complexity  EVALUATION COMPLEXITY: Moderate   GOALS: Goals reviewed with patient? Yes  SHORT TERM GOALS: Target date: 12/08/2021   Patient will be independent with initial HEP.  Baseline: needs Goal status: IN PROGRESS 12/14/21- given 12/17/21- good compliance but cues needed for seated pelvic tilts    LONG TERM GOALS: Target date: 01/05/2022    Patient will be independent with advanced/ongoing HEP to improve outcomes and carryover.  Baseline: needs progression  Goal status: IN PROGRESS  2.  Patient will report 50% improvement in low back pain to improve QOL.  Baseline: 6-25/10 low back pain Goal status: IN PROGRESS  3.  Patient will be able to stand/walk with upright posture without increased LBP.   Baseline: forward flexed posture due to pian Goal status: IN PROGRESS  4.   Patient will report 12% improvement on Oswestry to demonstrate improved functional ability.  Baseline: 38% disability Goal status: IN PROGRESS   5.  Patient will tolerate 15 min of standing to perform ADLs. Baseline: 10 minutes before  needing to sit Goal status: IN PROGRESS  6.  Patient will tolerate 15 min walking on treadmill to improve activity tolerance and exercise.  Baseline: 10 min max Goal status: IN PROGRESS  PLAN: PT FREQUENCY: 1-2x/week  PT DURATION: 6 weeks  PLANNED INTERVENTIONS: Therapeutic exercises, Therapeutic activity, Neuromuscular re-education, Balance training, Gait training, Patient/Family education, Self Care, Joint mobilization, Dry Needling, Electrical stimulation, Spinal mobilization, Manual therapy, and Re-evaluation.  PLAN FOR NEXT SESSION: review core strengthening focusing on flexion/neutral and TrA activation, manual therapy/TrDN to lumbar multifidi/glutes/QL   Rennie Natter, PT, DPT  12/17/2021, 11:56 AM

## 2021-12-22 ENCOUNTER — Encounter: Payer: Self-pay | Admitting: Physical Therapy

## 2021-12-22 ENCOUNTER — Ambulatory Visit: Payer: Medicare PPO | Attending: Family Medicine | Admitting: Physical Therapy

## 2021-12-22 DIAGNOSIS — M6281 Muscle weakness (generalized): Secondary | ICD-10-CM | POA: Diagnosis present

## 2021-12-22 DIAGNOSIS — M5459 Other low back pain: Secondary | ICD-10-CM | POA: Insufficient documentation

## 2021-12-22 DIAGNOSIS — R252 Cramp and spasm: Secondary | ICD-10-CM | POA: Insufficient documentation

## 2021-12-22 NOTE — Therapy (Signed)
OUTPATIENT PHYSICAL THERAPY TREATMENT   Patient Name: Heidi Lutz MRN: [id number] DOB:1940/10/05, 81 y.o., female Today's Date: 12/22/2021   PT End of Session - 12/22/21 1445     Visit Number 5    Number of Visits 12    Date for PT Re-Evaluation 01/05/22    Authorization Type [employer] M[employer]    Progress Note Due on Visit 10    PT Start Time 8250    PT Stop Time 5397    PT Time Calculation (min) 43 min    Activity Tolerance Patient tolerated treatment well    Behavior During Therapy [facility] for tasks assessed/performed             Past Medical History:  Diagnosis Date   Cancer ([place]) 01/2017   left breast cancer, basal cell and 1 melanoma   Complication of anesthesia    heart rate spikeed in the PACU on her 2nd knee surgery   Environmental allergies    High cholesterol    OAB (overactive bladder) 02/2020   Osteoarthritis    Personal history of radiation therapy    PONV (postoperative nausea and vomiting)    with 1st knee surgery had n/v, none since   Raynaud disease    Raynauds disease-takes procardia   Rheumatoid arthritis ([place])    Past Surgical History:  Procedure Laterality Date   ABDOMINAL HYSTERECTOMY  1972   APPENDECTOMY  1953   BREAST BIOPSY Left 05/19/2020   benign   BREAST LUMPECTOMY Left 2019   BREAST LUMPECTOMY WITH RADIOACTIVE SEED AND SENTINEL LYMPH NODE BIOPSY Left 04/07/2017   Procedure: BREAST LUMPECTOMY WITH RADIOACTIVE SEED AND SENTINEL LYMPH NODE BIOPSY;  Surgeon: Erroll Luna, MD;  Location: [place];  Service: General;  Laterality: Left;   BUNIONECTOMY  1999   COLONOSCOPY     EYE SURGERY     bil cataract   GANGLION CYST EXCISION  1968   JOINT REPLACEMENT Left 2017    reverse shoulder replacement   REPLACEMENT TOTAL KNEE  2012, 2013   Right and Left   SHOULDER ARTHROSCOPY Left    [place]   Patient Active Problem List   Diagnosis Date Noted   Rheumatoid arthritis ([place])    Raynaud disease    PONV  (postoperative nausea and vomiting)    Personal history of radiation therapy    Osteoarthritis    High cholesterol    Environmental allergies    Complication of anesthesia    OAB (overactive bladder) 02/2020   Age-related osteoporosis without current pathological fracture 10/31/2019   History of vertebral compression fracture 07/16/2019   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   Pleuritic chest pain 04/10/2018   GAD (generalized anxiety disorder) 04/25/2017   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   LBBB (left bundle branch block) 03/22/2017   Pre-operative cardiovascular examination 03/22/2017   Mixed hyperlipidemia 03/22/2017   Malignant neoplasm of central portion of left breast in female, estrogen receptor positive ([place]) 03/16/2017   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/2017   Osteopenia 03/03/2017   Cancer ([place]) 01/2017   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   Lumbar degenerative disc disease 02/27/2015   Lumbar spinal stenosis 02/27/2015   Osteoarthritis of spine with radiculopathy, lumbar region 02/27/2015   Basal cell carcinoma 12/15/2014   Closed compression fracture of L1 lumbar vertebra, with routine healing, subsequent encounter 11/26/2013   Insomnia 02/02/2010   Hyperlipidemia 04/22/2009   Essential hypertension 09/19/2008  Allergic rhinitis 07/31/2007   IBS (irritable bowel syndrome) 08/18/2006   Raynaud's disease 08/18/2006    PCP: Heidi Rasmussen, MD  REFERRING PROVIDER: [name], MD  REFERRING DIAG: [phone] (ICD-10-CM) - Lumbar stenosis  Rationale for Evaluation and Treatment Rehabilitation  THERAPY DIAG:  Other low back pain  Cramp and spasm  Muscle weakness (generalized)  ONSET DATE: chronic since age 27  SUBJECTIVE:                                                                                                                                                                                            SUBJECTIVE STATEMENT: Heidi Lutz reports she still gets at least 2 days relief from pain after interventions.  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  PAIN:  Are you having pain? Yes: NPRS scale: 9/10 Pain location: low back  Pain description: constant pain also gets "screaming meemees" 25/10 Aggravating factors: pressure/weather changes, walking more than 10 min, prolonged standing >10 min  Relieving factors: sitting down   PRECAUTIONS: None  WEIGHT BEARING RESTRICTIONS No  FALLS:  Has patient fallen in last 6 months? No  LIVING ENVIRONMENT: Lives with: lives with their spouse Lives in: House/apartment Stairs:  3rd floor apartment, lives in [place] Has following equipment at home: Gilford Rile - 2 wheeled  OCCUPATION: retired  PLOF: Independent  PATIENT GOALS improve pain   OBJECTIVE:   DIAGNOSTIC FINDINGS:  04/24/2019 COMPARISON: December 2016. September 2018.    INDICATION: Wedge compression fracture of unspecified thoracic vertebra, initial encounter for closed fracture (#).   TECHNIQUE: MRI SPINE THORACIC WO IV CONTRAST-- Multiplanar, multisequence MR imaging of the thoracic spine performed.   FINDINGS:  Osseous structures: Chronic mild L1 vertebral body compression fracture. Chronic minimal T11 inferior vertebral body compression fracture.  Alignment: Alignment maintained.  Spinal cord: No intrinsic spinal cord abnormality.  Paraspinal tissues: Unremarkable.  Contrast: None given.  Incidental: Trace right pleural effusion. Small seroma  left breast.   DISC SPACES:  Incompletely characterized severe lower cervical spine DDD/facet arthrosis/uncovertebral joint spurring. Moderate left posterior lateral quadrants disc protrusion at T11-T12. Tiny scattered thoracic spine disc bulges/protrusions mostly concentrated in the upper thoracic spine.  PATIENT SURVEYS:  Modified Oswestry 19/50= 38% disability  SCREENING FOR RED FLAGS: Bowel or bladder incontinence: No Spinal tumors: No Cauda equina syndrome: No Compression fracture: Yes: closed L1 compression fracture Abdominal aneurysm: No  COGNITION:  Overall cognitive status: Within functional limits for tasks assessed     SENSATION: No numbness or parasthesias  MUSCLE LENGTH: Hamstrings: Right 90 deg; Left 90 deg  POSTURE: rounded shoulders, forward head, decreased lumbar lordosis, and increased thoracic kyphosis, forward flexed posture   PALPATION: Tenderness throughout lumbar paraspinals, QL, glutes, piriformis bil.  Decreased mobility throughout lumbar spine, decreased lordotic curvature but no scoliosis.   LUMBAR ROM:   Active  AROM  eval  Flexion To knees  Extension To neutral *  Right lateral flexion To knees *  Left lateral flexion To knees *  Right rotation WNL  Left rotation WNL   (Blank rows = not tested)  *increased pain  LOWER EXTREMITY ROM:    good hip mobility bil for IR/ER  LOWER EXTREMITY MMT:    MMT Right* eval Left* eval  Hip flexion 4+ 4+  Hip extension 5 5  Hip abduction 5 5  Hip adduction 5 5  Knee flexion 5 5  Knee extension 5 5  Ankle dorsiflexion 5 5  Ankle plantarflexion 5 5   (Blank rows = not tested) *tested in sitting  LUMBAR SPECIAL TESTS:  Straight leg raise test: Negative and FABER test: Negative  FUNCTIONAL TESTS:  5 times sit to stand: 15 seconds  GAIT: Distance walked: 34' Assistive device utilized: None Level of assistance: Complete Independence Comments: forward flexed posture with gait.      TODAY'S  TREATMENT  12/22/2021 Therapeutic Exercise: to improve strength and mobility.  Demo, verbal and tactile cues throughout for technique. Treadmill x 5 min 4.0 grad, 1.8 mph Manual Therapy: to decrease muscle spasm and pain and improve mobility STM/TPR to lumbar paraspinals, R UPA mobs, STM/TPR to bil  glutes and piriformis, skilled palpation and monitoring during dry needling. Trigger Point Dry-Needling  Treatment instructions: Expect mild to moderate muscle soreness. S/S of pneumothorax if dry needled over a lung field, and to seek immediate medical attention should they occur. Patient verbalized understanding of these instructions and education.  Patient Consent Given: Yes Education handout provided: Yes Muscles treated: R lumbar multifidi L2-L5, bil glut med, bil  piriformis Electrical stimulation performed: No Parameters: N/A Treatment response/outcome: Twitch Response Elicited and Palpable Increase in Muscle Length Modalities: MHP to back after session while waiting for husband.    12/17/2021 Therapeutic Exercise: to improve strength and mobility.  Demo, verbal and tactile cues throughout for technique. Treadmill x 6 min 4.0 grad, 1.0 mph Seated pelvic tilts x 10 with cues.  Manual Therapy: to decrease muscle spasm and pain and improve mobility STM/TPR to lumbar paraspinals, R UPA mobs, STM/TPR to bil  glutes and piriformis, skilled palpation and monitoring during dry needling. Trigger Point Dry-Needling  Treatment instructions: Expect mild to moderate muscle soreness. S/S of pneumothorax if dry needled over a lung field, and to seek immediate medical attention should they occur. Patient verbalized understanding of these instructions and education.  Patient Consent Given: Yes Education handout provided: Yes Muscles treated: R lumbar multifidi L2-L5, bil glut med, bil  piriformis Electrical stimulation performed: No Parameters: N/A Treatment response/outcome: Twitch Response Elicited  and Palpable Increase in Muscle Length   12/14/2021 Therapeutic Exercise: to improve strength and mobility.  Demo, verbal and tactile cues throughout for technique. Treadmill x 5 min 4.0 grad, 1.2 mph Seated pelvic tilts x 10  Supine posterior pelvic tilts x 10  Supine marches with TrA x 10 LTR x 10 small ROM Manual Therapy: to decrease muscle spasm and pain and improve mobility STM/TPR to lumbar paraspinals, R UPA mobs, STM/TPR to bil  glutes and piriformis, skilled palpation and monitoring during dry needling. Trigger Point Dry-Needling  Treatment instructions: Expect mild to moderate muscle soreness. S/S of pneumothorax if dry needled over a lung field, and to seek immediate medical attention should they occur. Patient verbalized understanding of these instructions and education.  Patient Consent Given: Yes Education handout provided: Yes Muscles treated: R lumbar multifidi L2-L5, bil glut med, bil  piriformis Electrical stimulation performed: No Parameters: N/A Treatment response/outcome: Twitch Response Elicited and Palpable Increase in Muscle Length  PATIENT EDUCATION:  Education details: continue HEP as tolerated Person educated: Patient and Child(ren) Education method: Explanation, Demonstration, Verbal cues, and Handouts Education comprehension: verbalized understanding and returned demonstration   HOME EXERCISE PROGRAM: Access Code: [email or web address] URL: [email or web address] Date: 12/14/2021 Prepared by: Glenetta Hew  Exercises - Supine Lower Trunk Rotation  - 1 x daily - 7 x weekly - 2 sets - 10 reps - Supine Posterior Pelvic Tilt  - 1 x daily - 7 x weekly - 2 sets - 10 reps - Seated Posterior Pelvic Tilt  - 1 x daily - 7 x weekly - 2 sets - 10 reps  ASSESSMENT:  CLINICAL IMPRESSION: Prudy Candy  continues to report significant relief following PT for at least 2 days.   She was able to tolerated 5 minutes of walking at much faster speed today  before needing break, however focused session on manual therapy due to increased LBP from cooking, followed by MHP to back in waiting room which helped pain significantly.  Also suggested tall stool for kitchen to decrease pain, daughter will look into getting.  Derrek Monaco would benefit from skilled physical therapy to decrease low back pain and improve activity tolerance.     OBJECTIVE IMPAIRMENTS decreased activity tolerance, difficulty walking, decreased strength, increased muscle spasms, improper body mechanics, postural dysfunction, and pain.   ACTIVITY LIMITATIONS lifting, standing, stairs, and locomotion level  PARTICIPATION LIMITATIONS: meal prep, cleaning, and community activity  PERSONAL FACTORS Time since onset of injury/illness/exacerbation and 3+ comorbidities: lumbar stenosis, RA, OA, Raynauds, history breast cancer, abdominal hysterectomy  are also affecting patient's functional outcome.   REHAB POTENTIAL: Good  CLINICAL DECISION MAKING: Evolving/moderate complexity  EVALUATION COMPLEXITY: Moderate   GOALS: Goals reviewed with patient? Yes  SHORT TERM GOALS: Target date: 12/08/2021   Patient will be independent with initial HEP.  Baseline: needs Goal status: IN PROGRESS 12/14/21- given 12/17/21- good compliance but cues needed for seated pelvic tilts    LONG TERM GOALS: Target date: 01/05/2022    Patient will be independent with advanced/ongoing HEP to improve outcomes and carryover.  Baseline: needs progression  Goal status: IN PROGRESS  2.  Patient will report 50% improvement in low back pain to improve QOL.  Baseline: 6-25/10 low back pain Goal status: IN PROGRESS  3.  Patient will be able to stand/walk with upright posture without increased LBP.   Baseline: forward flexed posture due to pian Goal status: IN PROGRESS  4.   Patient will report 12% improvement on Oswestry to demonstrate improved functional ability.  Baseline: 38% disability Goal  status: IN PROGRESS   5.  Patient will tolerate 15 min of standing to perform ADLs. Baseline: 10 minutes before needing to sit Goal status: IN PROGRESS  6.  Patient will tolerate 15 min walking on treadmill to improve activity tolerance and exercise.  Baseline: 10 min max Goal status: IN PROGRESS  PLAN: PT FREQUENCY: 1-2x/week  PT DURATION: 6 weeks  PLANNED INTERVENTIONS: Therapeutic exercises, Therapeutic activity, Neuromuscular re-education, Balance training, Gait training, Patient/Family education, Self Care, Joint mobilization, Dry Needling, Electrical stimulation, Spinal mobilization, Manual therapy, and Re-evaluation.  PLAN FOR NEXT SESSION: review core strengthening focusing on flexion/neutral and TrA activation, manual therapy/TrDN to lumbar multifidi/glutes/QL   Rennie Natter, PT, DPT  12/22/2021, 3:47 PM

## 2021-12-24 ENCOUNTER — Encounter: Payer: Self-pay | Admitting: Physical Therapy

## 2021-12-24 ENCOUNTER — Ambulatory Visit: Payer: Medicare PPO | Admitting: Physical Therapy

## 2021-12-24 DIAGNOSIS — M5459 Other low back pain: Secondary | ICD-10-CM | POA: Diagnosis not present

## 2021-12-24 DIAGNOSIS — R252 Cramp and spasm: Secondary | ICD-10-CM

## 2021-12-24 DIAGNOSIS — M6281 Muscle weakness (generalized): Secondary | ICD-10-CM

## 2021-12-24 NOTE — Therapy (Signed)
OUTPATIENT PHYSICAL THERAPY TREATMENT   Patient Name: Heidi Lutz MRN: [id number] DOB:17-Jun-1940, 81 y.o., female Today's Date: 12/24/2021   PT End of Session - 12/24/21 1119     Visit Number 6    Number of Visits 12    Date for PT Re-Evaluation 01/05/22    Authorization Type [employer] MCR    Progress Note Due on Visit 10    PT Start Time 1106    PT Stop Time 1146    PT Time Calculation (min) 40 min    Activity Tolerance Patient tolerated treatment well    Behavior During Therapy [facility] for tasks assessed/performed             Past Medical History:  Diagnosis Date   Cancer ([place]) 01/2017   left breast cancer, basal cell and 1 melanoma   Complication of anesthesia    heart rate spikeed in the PACU on her 2nd knee surgery   Environmental allergies    High cholesterol    OAB (overactive bladder) 02/2020   Osteoarthritis    Personal history of radiation therapy    PONV (postoperative nausea and vomiting)    with 1st knee surgery had n/v, none since   Raynaud disease    Raynauds disease-takes procardia   Rheumatoid arthritis ([place])    Past Surgical History:  Procedure Laterality Date   ABDOMINAL HYSTERECTOMY  1972   APPENDECTOMY  1953   BREAST BIOPSY Left 05/19/2020   benign   BREAST LUMPECTOMY Left 2019   BREAST LUMPECTOMY WITH RADIOACTIVE SEED AND SENTINEL LYMPH NODE BIOPSY Left 04/07/2017   Procedure: BREAST LUMPECTOMY WITH RADIOACTIVE SEED AND SENTINEL LYMPH NODE BIOPSY;  Surgeon: Erroll Luna, MD;  Location: [place];  Service: General;  Laterality: Left;   BUNIONECTOMY  1999   COLONOSCOPY     EYE SURGERY     bil cataract   GANGLION CYST EXCISION  1968   JOINT REPLACEMENT Left 2017    reverse shoulder replacement   REPLACEMENT TOTAL KNEE  2012, 2013   Right and Left   SHOULDER ARTHROSCOPY Left    [place]   Patient Active Problem List   Diagnosis Date Noted   Rheumatoid arthritis ([place])    Raynaud disease    PONV  (postoperative nausea and vomiting)    Personal history of radiation therapy    Osteoarthritis    High cholesterol    Environmental allergies    Complication of anesthesia    OAB (overactive bladder) 02/2020   Age-related osteoporosis without current pathological fracture 10/31/2019   History of vertebral compression fracture 07/16/2019   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   Pleuritic chest pain 04/10/2018   GAD (generalized anxiety disorder) 04/25/2017   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   LBBB (left bundle branch block) 03/22/2017   Pre-operative cardiovascular examination 03/22/2017   Mixed hyperlipidemia 03/22/2017   Malignant neoplasm of central portion of left breast in female, estrogen receptor positive ([place]) 03/16/2017   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/2017   Osteopenia 03/03/2017   Cancer ([place]) 01/2017   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   Lumbar degenerative disc disease 02/27/2015   Lumbar spinal stenosis 02/27/2015   Osteoarthritis of spine with radiculopathy, lumbar region 02/27/2015   Basal cell carcinoma 12/15/2014   Closed compression fracture of L1 lumbar vertebra, with routine healing, subsequent encounter 11/26/2013   Insomnia 02/02/2010   Hyperlipidemia 04/22/2009   Essential hypertension 09/19/2008  Allergic rhinitis 07/31/2007   IBS (irritable bowel syndrome) 08/18/2006   Raynaud's disease 08/18/2006    PCP: [name], MD  REFERRING PROVIDER: [name], MD  REFERRING DIAG: [phone] (ICD-10-CM) - Lumbar stenosis  Rationale for Evaluation and Treatment Rehabilitation  THERAPY DIAG:  Other low back pain  Cramp and spasm  Muscle weakness (generalized)  ONSET DATE: chronic since age 60  SUBJECTIVE:                                                                                                                                                                                            SUBJECTIVE STATEMENT: Bevely Hackbart reports she "feels great".  Interventions are helping.    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  PAIN:  Are you having pain? Yes: NPRS scale: 4/10 Pain location: low back  Pain description: constant pain also gets "screaming meemees" 25/10 Aggravating factors: pressure/weather changes, walking more than 10 min, prolonged standing >10 min  Relieving factors: sitting down   PRECAUTIONS: None  WEIGHT BEARING RESTRICTIONS No  FALLS:  Has patient fallen in last 6 months? No  LIVING ENVIRONMENT: Lives with: lives with their spouse Lives in: House/apartment Stairs:  3rd floor apartment, lives in [place] Has following equipment at home: Gilford Rile - 2 wheeled  OCCUPATION: retired  PLOF: Independent  PATIENT GOALS improve pain   OBJECTIVE:   DIAGNOSTIC FINDINGS:  04/24/2019 COMPARISON: December 2016. September 2018.    INDICATION: Wedge compression fracture of unspecified thoracic vertebra, initial encounter for closed fracture (#).   TECHNIQUE: MRI SPINE THORACIC WO IV CONTRAST-- Multiplanar, multisequence MR imaging of the thoracic spine performed.   FINDINGS:  Osseous structures: Chronic mild L1 vertebral body compression fracture. Chronic minimal T11 inferior vertebral body compression fracture.  Alignment: Alignment maintained.  Spinal cord: No intrinsic spinal cord abnormality.  Paraspinal tissues: Unremarkable.  Contrast: None given.  Incidental: Trace right pleural effusion. Small seroma left breast.   DISC SPACES:  Incompletely characterized severe lower cervical spine DDD/facet arthrosis/uncovertebral joint  spurring. Moderate left posterior lateral quadrants disc protrusion at T11-T12. Tiny scattered thoracic spine disc bulges/protrusions mostly concentrated in the upper thoracic spine.  PATIENT SURVEYS:  Modified Oswestry 19/50= 38% disability   SCREENING FOR RED FLAGS: Bowel or bladder incontinence: No Spinal tumors: No Cauda equina syndrome: No Compression fracture: Yes: closed L1 compression fracture  Abdominal aneurysm: No  COGNITION:  Overall cognitive status: Within functional limits for tasks assessed     SENSATION: No numbness or parasthesias  MUSCLE LENGTH: Hamstrings: Right 90 deg; Left 90 deg  POSTURE: rounded shoulders, forward head, decreased lumbar lordosis, and increased thoracic kyphosis, forward flexed posture   PALPATION: Tenderness throughout lumbar paraspinals, QL, glutes, piriformis bil.  Decreased mobility throughout lumbar spine, decreased lordotic curvature but no scoliosis.   LUMBAR ROM:   Active  AROM  eval  Flexion To knees  Extension To neutral *  Right lateral flexion To knees *  Left lateral flexion To knees *  Right rotation WNL  Left rotation WNL   (Blank rows = not tested)  *increased pain  LOWER EXTREMITY ROM:    good hip mobility bil for IR/ER  LOWER EXTREMITY MMT:    MMT Right* eval Left* eval  Hip flexion 4+ 4+  Hip extension 5 5  Hip abduction 5 5  Hip adduction 5 5  Knee flexion 5 5  Knee extension 5 5  Ankle dorsiflexion 5 5  Ankle plantarflexion 5 5   (Blank rows = not tested) *tested in sitting  LUMBAR SPECIAL TESTS:  Straight leg raise test: Negative and FABER test: Negative  FUNCTIONAL TESTS:  5 times sit to stand: 15 seconds  GAIT: Distance walked: 46' Assistive device utilized: None Level of assistance: Complete Independence Comments: forward flexed posture with gait.      TODAY'S TREATMENT  12/24/2021 Therapeutic Exercise: to improve strength and mobility.  Demo, verbal and tactile cues throughout for  technique. Treadmill x 6 min 4.0 grade, 2.0 mph Supine pelvic tilts x 10 Supine bridges x 10 Supine SLR x 10 bil  Supine clams RTB x 20 Manual Therapy: to decrease muscle spasm and pain and improve mobility STM/TPR to lumbar paraspinals, R UPA mobs, STM/TPR to bil  glutes and piriformis, skilled palpation and monitoring during dry needling. Trigger Point Dry-Needling  Treatment instructions: Expect mild to moderate muscle soreness. S/S of pneumothorax if dry needled over a lung field, and to seek immediate medical attention should they occur. Patient verbalized understanding of these instructions and education.  Patient Consent Given: Yes Education handout provided: Yes Muscles treated: bil lumbar multifidi L2-L5, bil glut med, bil  piriformis Electrical stimulation performed: No Parameters: N/A Treatment response/outcome: Twitch Response Elicited and Palpable Increase in Muscle Length  12/22/2021 Therapeutic Exercise: to improve strength and mobility.  Demo, verbal and tactile cues throughout for technique. Treadmill x 5 min 4.0 grad, 1.8 mph Manual Therapy: to decrease muscle spasm and pain and improve mobility STM/TPR to lumbar paraspinals, R UPA mobs, STM/TPR to bil  glutes and piriformis, skilled palpation and monitoring during dry needling. Trigger Point Dry-Needling  Treatment instructions: Expect mild to moderate muscle soreness. S/S of pneumothorax if dry needled over a lung field, and to seek immediate medical attention should they occur. Patient verbalized understanding of these instructions and education.  Patient Consent Given: Yes Education handout provided: Yes Muscles treated: R lumbar multifidi L2-L5, bil glut med, bil  piriformis Electrical stimulation performed: No Parameters: N/A Treatment response/outcome: Twitch Response Elicited and Palpable Increase in Muscle Length Modalities: MHP to back after session while waiting for husband.    12/17/2021 Therapeutic  Exercise: to improve strength and mobility.  Demo, verbal and tactile cues throughout for technique. Treadmill x 6 min 4.0 grad, 1.0 mph Seated pelvic tilts x 10 with cues.  Manual Therapy: to decrease muscle spasm and pain and improve  mobility STM/TPR to lumbar paraspinals, R UPA mobs, STM/TPR to bil  glutes and piriformis, skilled palpation and monitoring during dry needling. Trigger Point Dry-Needling  Treatment instructions: Expect mild to moderate muscle soreness. S/S of pneumothorax if dry needled over a lung field, and to seek immediate medical attention should they occur. Patient verbalized understanding of these instructions and education.  Patient Consent Given: Yes Education handout provided: Yes Muscles treated: R lumbar multifidi L2-L5, bil glut med, bil  piriformis Electrical stimulation performed: No Parameters: N/A Treatment response/outcome: Twitch Response Elicited and Palpable Increase in Muscle Length   12/14/2021 Therapeutic Exercise: to improve strength and mobility.  Demo, verbal and tactile cues throughout for technique. Treadmill x 5 min 4.0 grad, 1.2 mph Seated pelvic tilts x 10  Supine posterior pelvic tilts x 10  Supine marches with TrA x 10 LTR x 10 small ROM Manual Therapy: to decrease muscle spasm and pain and improve mobility STM/TPR to lumbar paraspinals, R UPA mobs, STM/TPR to bil  glutes and piriformis, skilled palpation and monitoring during dry needling. Trigger Point Dry-Needling  Treatment instructions: Expect mild to moderate muscle soreness. S/S of pneumothorax if dry needled over a lung field, and to seek immediate medical attention should they occur. Patient verbalized understanding of these instructions and education.  Patient Consent Given: Yes Education handout provided: Yes Muscles treated: R lumbar multifidi L2-L5, bil glut med, bil  piriformis Electrical stimulation performed: No Parameters: N/A Treatment response/outcome: Twitch  Response Elicited and Palpable Increase in Muscle Length  PATIENT EDUCATION:  Education details: continue HEP as tolerated Person educated: Patient and Child(ren) Education method: Explanation, Demonstration, Verbal cues, and Handouts Education comprehension: verbalized understanding and returned demonstration   HOME EXERCISE PROGRAM: Access Code: [email or web address] URL: [email or web address] Date: 12/14/2021 Prepared by: Glenetta Hew  Exercises - Supine Lower Trunk Rotation  - 1 x daily - 7 x weekly - 2 sets - 10 reps - Supine Posterior Pelvic Tilt  - 1 x daily - 7 x weekly - 2 sets - 10 reps - Seated Posterior Pelvic Tilt  - 1 x daily - 7 x weekly - 2 sets - 10 reps  ASSESSMENT:  CLINICAL IMPRESSION: Jakirah Zaun  continues to report significant relief following PT and still reporting decreased pain today especially compared to earlier in the week.  She tolerated progression of exercises today without increased LBP, noted muscle fatigue in hip flexors.  Derrek Monaco would benefit from skilled physical therapy to decrease low back pain and improve activity tolerance.     OBJECTIVE IMPAIRMENTS decreased activity tolerance, difficulty walking, decreased strength, increased muscle spasms, improper body mechanics, postural dysfunction, and pain.   ACTIVITY LIMITATIONS lifting, standing, stairs, and locomotion level  PARTICIPATION LIMITATIONS: meal prep, cleaning, and community activity  PERSONAL FACTORS Time since onset of injury/illness/exacerbation and 3+ comorbidities: lumbar stenosis, RA, OA, Raynauds, history breast cancer, abdominal hysterectomy  are also affecting patient's functional outcome.   REHAB POTENTIAL: Good  CLINICAL DECISION MAKING: Evolving/moderate complexity  EVALUATION COMPLEXITY: Moderate   GOALS: Goals reviewed with patient? Yes  SHORT TERM GOALS: Target date: 12/08/2021   Patient will be independent with initial HEP.   Baseline: needs Goal status: IN PROGRESS 12/14/21- given 12/17/21- good compliance but cues needed for seated pelvic tilts    LONG TERM GOALS: Target date: 01/05/2022    Patient will be independent with advanced/ongoing HEP to improve outcomes and carryover.  Baseline: needs progression  Goal status: IN PROGRESS  2.  Patient will report  50% improvement in low back pain to improve QOL.  Baseline: 6-25/10 low back pain Goal status: IN PROGRESS  3.  Patient will be able to stand/walk with upright posture without increased LBP.   Baseline: forward flexed posture due to pian Goal status: IN PROGRESS  4.   Patient will report 12% improvement on Oswestry to demonstrate improved functional ability.  Baseline: 38% disability Goal status: IN PROGRESS   5.  Patient will tolerate 15 min of standing to perform ADLs. Baseline: 10 minutes before needing to sit Goal status: IN PROGRESS  6.  Patient will tolerate 15 min walking on treadmill to improve activity tolerance and exercise.  Baseline: 10 min max Goal status: IN PROGRESS  PLAN: PT FREQUENCY: 1-2x/week  PT DURATION: 6 weeks  PLANNED INTERVENTIONS: Therapeutic exercises, Therapeutic activity, Neuromuscular re-education, Balance training, Gait training, Patient/Family education, Self Care, Joint mobilization, Dry Needling, Electrical stimulation, Spinal mobilization, Manual therapy, and Re-evaluation.  PLAN FOR NEXT SESSION: review core strengthening focusing on flexion/neutral and TrA activation, manual therapy/TrDN to lumbar multifidi/glutes/QL   Rennie Natter, PT, DPT  12/24/2021, 12:05 PM

## 2021-12-29 ENCOUNTER — Ambulatory Visit: Payer: Medicare PPO | Admitting: Physical Therapy

## 2021-12-29 ENCOUNTER — Encounter: Payer: Self-pay | Admitting: Physical Therapy

## 2021-12-29 DIAGNOSIS — M5459 Other low back pain: Secondary | ICD-10-CM

## 2021-12-29 DIAGNOSIS — R252 Cramp and spasm: Secondary | ICD-10-CM

## 2021-12-29 DIAGNOSIS — M6281 Muscle weakness (generalized): Secondary | ICD-10-CM

## 2021-12-29 NOTE — Therapy (Signed)
OUTPATIENT PHYSICAL THERAPY TREATMENT   Patient Name: Heidi Lutz MRN: [id number] DOB:[birth year], 81 y.o., female Today's Date: 12/29/2021   PT End of Session - 12/29/21 1442     Visit Number 7    Number of Visits 12    Date for PT Re-Evaluation 01/05/22    Authorization Type [employer] MCR    Progress Note Due on Visit 10    PT Start Time 1401    PT Stop Time 1440    PT Time Calculation (min) 39 min    Activity Tolerance Patient tolerated treatment well    Behavior During Therapy [facility] for tasks assessed/performed             Past Medical History:  Diagnosis Date   Cancer ([place]) 01/2017   left breast cancer, basal cell and 1 melanoma   Complication of anesthesia    heart rate spikeed in the PACU on her 2nd knee surgery   Environmental allergies    High cholesterol    OAB (overactive bladder) 02/2020   Osteoarthritis    Personal history of radiation therapy    PONV (postoperative nausea and vomiting)    with 1st knee surgery had n/v, none since   Raynaud disease    Raynauds disease-takes procardia   Rheumatoid arthritis ([place])    Past Surgical History:  Procedure Laterality Date   ABDOMINAL HYSTERECTOMY  1972   APPENDECTOMY  1953   BREAST BIOPSY Left 05/19/2020   benign   BREAST LUMPECTOMY Left 2019   BREAST LUMPECTOMY WITH RADIOACTIVE SEED AND SENTINEL LYMPH NODE BIOPSY Left 04/07/2017   Procedure: BREAST LUMPECTOMY WITH RADIOACTIVE SEED AND SENTINEL LYMPH NODE BIOPSY;  Surgeon: Erroll Luna, MD;  Location: [place];  Service: General;  Laterality: Left;   BUNIONECTOMY  1999   COLONOSCOPY     EYE SURGERY     bil cataract   GANGLION CYST EXCISION  1968   JOINT REPLACEMENT Left 2017    reverse shoulder replacement   REPLACEMENT TOTAL KNEE  2012, 2013   Right and Left   SHOULDER ARTHROSCOPY Left    [place]   Patient Active Problem List   Diagnosis Date Noted   Rheumatoid arthritis ([place])    Raynaud disease    PONV  (postoperative nausea and vomiting)    Personal history of radiation therapy    Osteoarthritis    High cholesterol    Environmental allergies    Complication of anesthesia    OAB (overactive bladder) 02/2020   Age-related osteoporosis without current pathological fracture 10/31/2019   History of vertebral compression fracture 07/16/2019   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   Pleuritic chest pain 04/10/2018   GAD (generalized anxiety disorder) 04/25/2017   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   LBBB (left bundle branch block) 03/22/2017   Pre-operative cardiovascular examination 03/22/2017   Mixed hyperlipidemia 03/22/2017   Malignant neoplasm of central portion of left breast in female, estrogen receptor positive ([place]) 03/16/2017   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/2017   Osteopenia 03/03/2017   Cancer ([place]) 01/2017   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   Lumbar degenerative disc disease 02/27/2015   Lumbar spinal stenosis 02/27/2015   Osteoarthritis of spine with radiculopathy, lumbar region 02/27/2015   Basal cell carcinoma 12/15/2014   Closed compression fracture of L1 lumbar vertebra, with routine healing, subsequent encounter 11/26/2013   Insomnia 02/02/2010   Hyperlipidemia 04/22/2009   Essential hypertension 09/19/2008  Allergic rhinitis 07/31/2007   IBS (irritable bowel syndrome) 08/18/2006   Raynaud's disease 08/18/2006    PCP: [name], MD  REFERRING PROVIDER: [name], MD  REFERRING DIAG: [phone] (ICD-10-CM) - Lumbar stenosis  Rationale for Evaluation and Treatment Rehabilitation  THERAPY DIAG:  Other low back pain  Cramp and spasm  Muscle weakness (generalized)  ONSET DATE: chronic since age 81  SUBJECTIVE:                                                                                                                                                                                            SUBJECTIVE STATEMENT: Heidi Lutz reports that she has felt the best she has in years and is thrilled.    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  PAIN:  Are you having pain? Yes: NPRS scale: 2/10 Pain location: low back  Pain description: constant pain also gets "screaming meemees" 25/10 Aggravating factors: pressure/weather changes, walking more than 10 min, prolonged standing >10 min  Relieving factors: sitting down   PRECAUTIONS: None  WEIGHT BEARING RESTRICTIONS No  FALLS:  Has patient fallen in last 6 months? No  LIVING ENVIRONMENT: Lives with: lives with their spouse Lives in: House/apartment Stairs:  3rd floor apartment, lives in [place] Has following equipment at home: Gilford Rile - 2 wheeled  OCCUPATION: retired  PLOF: Independent  PATIENT GOALS improve pain   OBJECTIVE:   DIAGNOSTIC FINDINGS:  04/24/2019 COMPARISON: December 2016. September 2018.    INDICATION: Wedge compression fracture of unspecified thoracic vertebra, initial encounter for closed fracture (#).   TECHNIQUE: MRI SPINE THORACIC WO IV CONTRAST-- Multiplanar, multisequence MR imaging of the thoracic spine performed.   FINDINGS:  Osseous structures: Chronic mild L1 vertebral body compression fracture. Chronic minimal T11 inferior vertebral body compression fracture.  Alignment: Alignment maintained.  Spinal cord: No intrinsic spinal cord abnormality.  Paraspinal tissues: Unremarkable.  Contrast: None given.  Incidental: Trace right pleural effusion. Small seroma left breast.   DISC SPACES:  Incompletely characterized severe lower cervical spine DDD/facet arthrosis/uncovertebral  joint spurring. Moderate left posterior lateral quadrants disc protrusion at T11-T12. Tiny scattered thoracic spine disc bulges/protrusions mostly concentrated in the upper thoracic spine.  PATIENT SURVEYS:  Modified Oswestry 19/50= 38% disability   SCREENING FOR RED FLAGS: Bowel or bladder incontinence: No Spinal tumors: No Cauda equina syndrome: No Compression  fracture: Yes: closed L1 compression fracture Abdominal aneurysm: No  COGNITION:  Overall cognitive status: Within functional limits for tasks assessed     SENSATION: No numbness or parasthesias  MUSCLE LENGTH: Hamstrings: Right 90 deg; Left 90 deg  POSTURE: rounded shoulders, forward head, decreased lumbar lordosis, and increased thoracic kyphosis, forward flexed posture   PALPATION: Tenderness throughout lumbar paraspinals, QL, glutes, piriformis bil.  Decreased mobility throughout lumbar spine, decreased lordotic curvature but no scoliosis.   LUMBAR ROM:   Active  AROM  eval  Flexion To knees  Extension To neutral *  Right lateral flexion To knees *  Left lateral flexion To knees *  Right rotation WNL  Left rotation WNL   (Blank rows = not tested)  *increased pain  LOWER EXTREMITY ROM:    good hip mobility bil for IR/ER  LOWER EXTREMITY MMT:    MMT Right* eval Left* eval  Hip flexion 4+ 4+  Hip extension 5 5  Hip abduction 5 5  Hip adduction 5 5  Knee flexion 5 5  Knee extension 5 5  Ankle dorsiflexion 5 5  Ankle plantarflexion 5 5   (Blank rows = not tested) *tested in sitting  LUMBAR SPECIAL TESTS:  Straight leg raise test: Negative and FABER test: Negative  FUNCTIONAL TESTS:  5 times sit to stand: 15 seconds  GAIT: Distance walked: 85' Assistive device utilized: None Level of assistance: Complete Independence Comments: forward flexed posture with gait.      TODAY'S TREATMENT  12/29/2021 Therapeutic Exercise: to improve strength and mobility.  Demo, verbal and tactile cues throughout  for technique. Treadmill x 6 min, 3.5 grade, 2.1 mph Manual Therapy: to decrease muscle spasm and pain and improve mobility STM/TPR to lumbar paraspinals, R UPA mobs, STM/TPR to R glutes, piriformis, and QL skilled palpation and monitoring during dry needling. Trigger Point Dry-Needling  Treatment instructions: Expect mild to moderate muscle soreness. S/S of pneumothorax if dry needled over a lung field, and to seek immediate medical attention should they occur. Patient verbalized understanding of these instructions and education.  Patient Consent Given: Yes Education handout provided: Yes Muscles treated: bil lumbar multifidi L2-L5, R glut med, R  piriformis, R QL Electrical stimulation performed: No Parameters: N/A Treatment response/outcome: Twitch Response Elicited and Palpable Increase in Muscle Length  12/24/2021 Therapeutic Exercise: to improve strength and mobility.  Demo, verbal and tactile cues throughout for technique. Treadmill x 6 min 4.0 grade, 2.0 mph Supine pelvic tilts x 10 Supine bridges x 10 Supine SLR x 10 bil  Supine clams RTB x 20 Manual Therapy: to decrease muscle spasm and pain and improve mobility STM/TPR to lumbar paraspinals, R UPA mobs, STM/TPR to bil  glutes and piriformis, skilled palpation and monitoring during dry needling. Trigger Point Dry-Needling  Treatment instructions: Expect mild to moderate muscle soreness. S/S of pneumothorax if dry needled over a lung field, and to seek immediate medical attention should they occur. Patient verbalized understanding of these instructions and education.  Patient Consent Given: Yes Education handout provided: Yes Muscles treated: bil lumbar multifidi L2-L5, bil glut med, bil  piriformis Electrical stimulation performed: No Parameters: N/A Treatment response/outcome: Twitch Response Elicited and Palpable Increase in Muscle Length  12/22/2021 Therapeutic Exercise: to improve strength and mobility.  Demo, verbal and  tactile cues throughout for technique. Treadmill x 5 min 4.0 grad, 1.8 mph Manual Therapy: to decrease muscle spasm and pain and improve mobility STM/TPR to lumbar paraspinals, R UPA mobs, STM/TPR to bil  glutes and piriformis, skilled palpation and monitoring during dry needling. Trigger Point Dry-Needling  Treatment instructions: Expect mild to moderate muscle soreness. S/S of pneumothorax if dry needled over a lung field, and to seek immediate medical attention should they occur. Patient verbalized understanding of these instructions and education.  Patient Consent Given: Yes Education handout provided: Yes Muscles treated: R lumbar multifidi L2-L5, bil glut med, bil  piriformis Electrical stimulation performed: No Parameters: N/A Treatment response/outcome: Twitch Response Elicited and Palpable Increase in Muscle Length Modalities: MHP to back after session while waiting for husband.     PATIENT EDUCATION:  Education details: continue HEP as tolerated, sleeping position with pillow under L side to take pressure off L shoulder Person educated: Patient and Child(ren) Education method: Explanation and Demonstration Education comprehension: verbalized understanding and returned demonstration   HOME EXERCISE PROGRAM: Access Code: [email or web address] URL: [email or web address] Date: 12/14/2021 Prepared by: Glenetta Hew  Exercises - Supine Lower Trunk Rotation  - 1 x daily - 7 x weekly - 2 sets - 10 reps - Supine Posterior Pelvic Tilt  - 1 x daily - 7 x weekly - 2 sets - 10 reps - Seated Posterior Pelvic Tilt  - 1 x daily - 7 x weekly - 2 sets - 10 reps  ASSESSMENT:  CLINICAL IMPRESSION: Heidi Lutz continues to report improving pain in her low back, and reports how much better she feels not having pain all the time.  She is very compliant with exercises and completes two times per day.  Today also noted some tenderness in her right QL which was addressed with  manual therapy she tolerated well and reported decreased pain following interventions.  Heidi Lutz would benefit from skilled physical therapy to decrease low back pain and improve activity tolerance.     OBJECTIVE IMPAIRMENTS decreased activity tolerance, difficulty walking, decreased strength, increased muscle spasms, improper body mechanics, postural dysfunction, and pain.   ACTIVITY LIMITATIONS lifting, standing, stairs, and locomotion level  PARTICIPATION LIMITATIONS: meal prep, cleaning, and community activity  PERSONAL FACTORS Time since onset of injury/illness/exacerbation and 3+ comorbidities: lumbar stenosis, RA, OA, Raynauds, history breast cancer, abdominal hysterectomy  are also affecting patient's functional outcome.   REHAB POTENTIAL: Good  CLINICAL DECISION MAKING: Evolving/moderate complexity  EVALUATION COMPLEXITY: Moderate   GOALS: Goals reviewed with patient? Yes  SHORT TERM GOALS: Target date: 12/08/2021   Patient will be independent with initial HEP.  Baseline: needs Goal status: MET 12/14/21- given 12/17/21- good compliance but cues needed for seated pelvic tilts    LONG TERM GOALS: Target date: 01/05/2022    Patient will be independent with advanced/ongoing HEP to improve outcomes and carryover.  Baseline: needs progression  Goal status: IN PROGRESS 12/29/2021- completes 2x/day  2.  Patient will report 50% improvement in low back pain to improve QOL.  Baseline: 6-25/10 low back pain Goal status: IN PROGRESS  3.  Patient will be able to stand/walk with upright posture without increased LBP.   Baseline: forward flexed posture due to pian Goal status: IN PROGRESS  4.   Patient will report 12% improvement on Oswestry to demonstrate improved functional ability.  Baseline: 38% disability Goal status: IN PROGRESS   5.  Patient will tolerate 15 min of standing to perform ADLs. Baseline: 10 minutes before needing to sit Goal status: IN  PROGRESS  6.  Patient will tolerate 15 min walking on treadmill to improve activity tolerance and exercise.  Baseline: 10 min max Goal status: IN PROGRESS  PLAN:  PT FREQUENCY: 1-2x/week  PT DURATION: 6 weeks  PLANNED INTERVENTIONS: Therapeutic exercises, Therapeutic activity, Neuromuscular re-education, Balance training, Gait training, Patient/Family education, Self Care, Joint mobilization, Dry Needling, Electrical stimulation, Spinal mobilization, Manual therapy, and Re-evaluation.  PLAN FOR NEXT SESSION: review core strengthening focusing on flexion/neutral and TrA activation, manual therapy/TrDN to lumbar multifidi/glutes/QL   Rennie Natter, PT, DPT  12/29/2021, 4:56 PM

## 2021-12-31 ENCOUNTER — Ambulatory Visit: Payer: Medicare PPO | Admitting: Physical Therapy

## 2021-12-31 ENCOUNTER — Encounter: Payer: Self-pay | Admitting: Physical Therapy

## 2021-12-31 DIAGNOSIS — M6281 Muscle weakness (generalized): Secondary | ICD-10-CM

## 2021-12-31 DIAGNOSIS — M5459 Other low back pain: Secondary | ICD-10-CM | POA: Diagnosis not present

## 2021-12-31 DIAGNOSIS — R252 Cramp and spasm: Secondary | ICD-10-CM

## 2021-12-31 NOTE — Therapy (Signed)
OUTPATIENT PHYSICAL THERAPY TREATMENT   Patient Name: Heidi Lutz MRN: [id number] DOB:[birth year], 81 y.o., female Today's Date: 12/31/2021   PT End of Session - 12/31/21 1016     Visit Number 8    Number of Visits 12    Date for PT Re-Evaluation 01/05/22    Authorization Type [employer] M[employer]    Progress Note Due on Visit 10    PT Start Time 7416    PT Stop Time 1056    PT Time Calculation (min) 40 min    Activity Tolerance Patient tolerated treatment well    Behavior During Therapy WFL for tasks assessed/performed             Past Medical History:  Diagnosis Date   Cancer ([place]) 01/2017   left breast cancer, basal cell and 1 melanoma   Complication of anesthesia    heart rate spikeed in the PACU on her 2nd knee surgery   Environmental allergies    High cholesterol    OAB (overactive bladder) 02/2020   Osteoarthritis    Personal history of radiation therapy    PONV (postoperative nausea and vomiting)    with 1st knee surgery had n/v, none since   Raynaud disease    Raynauds disease-takes procardia   Rheumatoid arthritis ([place])    Past Surgical History:  Procedure Laterality Date   ABDOMINAL HYSTERECTOMY  1972   APPENDECTOMY  1953   BREAST BIOPSY Left 05/19/2020   benign   BREAST LUMPECTOMY Left 2019   BREAST LUMPECTOMY WITH RADIOACTIVE SEED AND SENTINEL LYMPH NODE BIOPSY Left 04/07/2017   Procedure: BREAST LUMPECTOMY WITH RADIOACTIVE SEED AND SENTINEL LYMPH NODE BIOPSY;  Surgeon: Erroll Luna, MD;  Location: [facility];  Service: General;  Laterality: Left;   BUNIONECTOMY  1999   COLONOSCOPY     EYE SURGERY     bil cataract   GANGLION CYST EXCISION  1968   JOINT REPLACEMENT Left 2017    reverse shoulder replacement   REPLACEMENT TOTAL KNEE  2012, 2013   Right and Left   SHOULDER ARTHROSCOPY Left    [place]   Patient Active Problem List   Diagnosis Date Noted   Rheumatoid arthritis ([place])    Raynaud disease    PONV  (postoperative nausea and vomiting)    Personal history of radiation therapy    Osteoarthritis    High cholesterol    Environmental allergies    Complication of anesthesia    OAB (overactive bladder) 02/2020   Age-related osteoporosis without current pathological fracture 10/31/2019   History of vertebral compression fracture 07/16/2019   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   Pleuritic chest pain 04/10/2018   GAD (generalized anxiety disorder) 04/25/2017   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   LBBB (left bundle branch block) 03/22/2017   Pre-operative cardiovascular examination 03/22/2017   Mixed hyperlipidemia 03/22/2017   Malignant neoplasm of central portion of left breast in female, estrogen receptor positive ([place]) 03/16/2017   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/2017   Osteopenia 03/03/2017   Cancer ([place]) 01/2017   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   Lumbar degenerative disc disease 02/27/2015   Lumbar spinal stenosis 02/27/2015   Osteoarthritis of spine with radiculopathy, lumbar region 02/27/2015   Basal cell carcinoma 12/15/2014   Closed compression fracture of L1 lumbar vertebra, with routine healing, subsequent encounter 11/26/2013   Insomnia 02/02/2010   Hyperlipidemia 04/22/2009   Essential hypertension 09/19/2008  Allergic rhinitis 07/31/2007   IBS (irritable bowel syndrome) 08/18/2006   Raynaud's disease 08/18/2006    PCP: [name], MD  REFERRING PROVIDER: [name], MD  REFERRING DIAG: [phone] (ICD-10-CM) - Lumbar stenosis  Rationale for Evaluation and Treatment Rehabilitation  THERAPY DIAG:  Other low back pain  Cramp and spasm  Muscle weakness (generalized)  ONSET DATE: chronic since age 62  SUBJECTIVE:                                                                                                                                                                                            SUBJECTIVE STATEMENT: Heidi Lutz reports she walked 5k steps yesterday, "I only had to perch a couple of times, no screaming meemies."  PERTINENT HISTORY:  hx of left bundle branch block hypertension hyperlipidemia and a history of breast cancer with lumpectomy on the left adjuvant radiation therapy and tamoxifen therapy; history of compression fracture (L1) and osteroporosis; 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  PAIN:  Are you having pain? Yes: NPRS scale: 2-3/10 Pain location: low back  Pain description: constant pain also gets "screaming meemees" 25/10 Aggravating factors: pressure/weather changes, walking more than 10 min, prolonged standing >10 min  Relieving factors: sitting down   PRECAUTIONS: None  WEIGHT BEARING RESTRICTIONS No  FALLS:  Has patient fallen in last 6 months? No  LIVING ENVIRONMENT: Lives with: lives with their spouse Lives in: House/apartment Stairs:  3rd floor apartment, lives in [place] Has following equipment at home: Gilford Rile - 2 wheeled  OCCUPATION: retired  PLOF: Independent  PATIENT GOALS improve pain   OBJECTIVE:   DIAGNOSTIC FINDINGS:  04/24/2019 COMPARISON: December 2016. September 2018.    INDICATION: Wedge compression fracture of unspecified thoracic vertebra, initial encounter for closed fracture (#).   TECHNIQUE: MRI SPINE THORACIC WO IV CONTRAST-- Multiplanar, multisequence MR imaging of the thoracic spine performed.   FINDINGS:  Osseous structures: Chronic mild L1 vertebral body compression fracture. Chronic minimal T11 inferior vertebral body compression fracture.  Alignment: Alignment maintained.  Spinal cord: No intrinsic spinal cord abnormality.  Paraspinal tissues: Unremarkable.  Contrast: None given.  Incidental: Trace right pleural effusion. Small seroma left breast.   DISC SPACES:  Incompletely characterized severe lower cervical spine  DDD/facet arthrosis/uncovertebral joint spurring. Moderate left posterior lateral quadrants disc protrusion at T11-T12. Tiny scattered thoracic spine disc bulges/protrusions mostly concentrated in the upper thoracic spine.  PATIENT SURVEYS:  Modified Oswestry 19/50= 38% disability   SCREENING FOR RED FLAGS: Bowel or bladder incontinence: No Spinal tumors: No Cauda equina syndrome:  No Compression fracture: Yes: closed L1 compression fracture Abdominal aneurysm: No  COGNITION:  Overall cognitive status: Within functional limits for tasks assessed     SENSATION: No numbness or parasthesias  MUSCLE LENGTH: Hamstrings: Right 90 deg; Left 90 deg  POSTURE: rounded shoulders, forward head, decreased lumbar lordosis, and increased thoracic kyphosis, forward flexed posture   PALPATION: Tenderness throughout lumbar paraspinals, QL, glutes, piriformis bil.  Decreased mobility throughout lumbar spine, decreased lordotic curvature but no scoliosis.   LUMBAR ROM:   Active  AROM  eval  Flexion To knees  Extension To neutral *  Right lateral flexion To knees *  Left lateral flexion To knees *  Right rotation WNL  Left rotation WNL   (Blank rows = not tested)  *increased pain  LOWER EXTREMITY ROM:    good hip mobility bil for IR/ER  LOWER EXTREMITY MMT:    MMT Right* eval Left* eval  Hip flexion 4+ 4+  Hip extension 5 5  Hip abduction 5 5  Hip adduction 5 5  Knee flexion 5 5  Knee extension 5 5  Ankle dorsiflexion 5 5  Ankle plantarflexion 5 5   (Blank rows = not tested) *tested in sitting  LUMBAR SPECIAL TESTS:  Straight leg raise test: Negative and FABER test: Negative  FUNCTIONAL TESTS:  5 times sit to stand: 15 seconds  GAIT: Distance walked: 5' Assistive device utilized: None Level of assistance: Complete Independence Comments: forward flexed posture with gait.      TODAY'S TREATMENT  12/31/2021  Therapeutic Exercise: to improve strength and mobility.    Treadmill x 6 min, 3.5 grade, 2.1 mph  SBA for safety Manual Therapy: to decrease muscle spasm and pain and improve mobility STM/TPR to bil lumbar paraspinals, R UPA mobs, STM/TPR to bil glutes, piriformis, and QL; skilled palpation and monitoring during dry needling. Trigger Point Dry-Needling  Treatment instructions: Expect mild to moderate muscle soreness. S/S of pneumothorax if dry needled over a lung field, and to seek immediate medical attention should they occur. Patient verbalized understanding of these instructions and education.  Patient Consent Given: Yes Education handout provided: Yes Muscles treated: bil lumbar multifidi L2-L5,bil glut med, &  piriformis Electrical stimulation performed: No Parameters: N/A Treatment response/outcome: Twitch Response Elicited and Palpable Increase in Muscle Length  12/29/2021 Therapeutic Exercise: to improve strength and mobility.  Demo, verbal and tactile cues throughout for technique. Treadmill x 6 min, 3.5 grade, 2.1 mph Manual Therapy: to decrease muscle spasm and pain and improve mobility STM/TPR to lumbar paraspinals, R UPA mobs, STM/TPR to R glutes, piriformis, and QL skilled palpation and monitoring during dry needling. Trigger Point Dry-Needling  Treatment instructions: Expect mild to moderate muscle soreness. S/S of pneumothorax if dry needled over a lung field, and to seek immediate medical attention should they occur. Patient verbalized understanding of these instructions and education.  Patient Consent Given: Yes Education handout provided: Yes Muscles treated: bil lumbar multifidi L2-L5, R glut med, R  piriformis, R QL Electrical stimulation performed: No Parameters: N/A Treatment response/outcome: Twitch Response Elicited and Palpable Increase in Muscle Length  12/24/2021 Therapeutic Exercise: to improve strength and mobility.  Demo, verbal and tactile cues throughout for technique. Treadmill x 6 min 4.0 grade, 2.0 mph Supine  pelvic tilts x 10 Supine bridges x 10 Supine SLR x 10 bil  Supine clams RTB x 20 Manual Therapy: to decrease muscle spasm and pain and improve mobility STM/TPR to lumbar paraspinals, R UPA mobs, STM/TPR to bil  glutes and piriformis, skilled palpation and monitoring during dry needling. Trigger Point Dry-Needling  Treatment instructions: Expect mild to moderate muscle soreness. S/S of pneumothorax if dry needled over a lung field, and to seek immediate medical attention should they occur. Patient verbalized understanding of these instructions and education.  Patient Consent Given: Yes Education handout provided: Yes Muscles treated: bil lumbar multifidi L2-L5, bil glut med, bil  piriformis Electrical stimulation performed: No Parameters: N/A Treatment response/outcome: Twitch Response Elicited and Palpable Increase in Muscle Length    PATIENT EDUCATION:  Education details: continue HEP as tolerated, sleeping position with pillow under L side to take pressure off L shoulder Person educated: Patient and Child(ren) Education method: Explanation and Demonstration Education comprehension: verbalized understanding and returned demonstration   HOME EXERCISE PROGRAM: Access Code: [email or web address] URL: [email or web address] Date: 12/14/2021 Prepared by: Glenetta Hew  Exercises - Supine Lower Trunk Rotation  - 1 x daily - 7 x weekly - 2 sets - 10 reps - Supine Posterior Pelvic Tilt  - 1 x daily - 7 x weekly - 2 sets - 10 reps - Seated Posterior Pelvic Tilt  - 1 x daily - 7 x weekly - 2 sets - 10 reps  ASSESSMENT:  CLINICAL IMPRESSION: Heidi Lutz continues to report improving pain in her low back and improving activity tolerance.  She is not having sharp stabbing pain in her back now.  Continued focus on manual therapy.   Heidi Lutz would benefit from skilled physical therapy to decrease low back pain and improve activity tolerance.     OBJECTIVE  IMPAIRMENTS decreased activity tolerance, difficulty walking, decreased strength, increased muscle spasms, improper body mechanics, postural dysfunction, and pain.   ACTIVITY LIMITATIONS lifting, standing, stairs, and locomotion level  PARTICIPATION LIMITATIONS: meal prep, cleaning, and community activity  PERSONAL FACTORS Time since onset of injury/illness/exacerbation and 3+ comorbidities: lumbar stenosis, RA, OA, Raynauds, history breast cancer, abdominal hysterectomy  are also affecting patient's functional outcome.   REHAB POTENTIAL: Good  CLINICAL DECISION MAKING: Evolving/moderate complexity  EVALUATION COMPLEXITY: Moderate   GOALS: Goals reviewed with patient? Yes  SHORT TERM GOALS: Target date: 12/08/2021   Patient will be independent with initial HEP.  Baseline: needs Goal status: MET 12/14/21- given 12/17/21- good compliance but cues needed for seated pelvic tilts    LONG TERM GOALS: Target date: 01/05/2022    Patient will be independent with advanced/ongoing HEP to improve outcomes and carryover.  Baseline: needs progression  Goal status: IN PROGRESS 12/29/2021- completes 2x/day  2.  Patient will report 50% improvement in low back pain to improve QOL.  Baseline: 6-25/10 low back pain Goal status: IN PROGRESS  3.  Patient will be able to stand/walk with upright posture without increased LBP.   Baseline: forward flexed posture due to pian Goal status: IN PROGRESS  4.   Patient will report 12% improvement on Oswestry to demonstrate improved functional ability.  Baseline: 38% disability Goal status: IN PROGRESS   5.  Patient will tolerate 15 min of standing to perform ADLs. Baseline: 10 minutes before needing to sit Goal status: IN PROGRESS  6.  Patient will tolerate 15 min walking on treadmill to improve activity tolerance and exercise.  Baseline: 10 min max Goal status: IN PROGRESS  PLAN: PT FREQUENCY: 1-2x/week  PT DURATION: 6 weeks  PLANNED  INTERVENTIONS: Therapeutic exercises, Therapeutic activity, Neuromuscular re-education, Balance training, Gait training, Patient/Family education, Self Care, Joint mobilization, Dry Needling, Electrical stimulation, Spinal mobilization, Manual therapy, and Re-evaluation.  PLAN  FOR NEXT SESSION: review core strengthening focusing on flexion/neutral and TrA activation, manual therapy/TrDN to lumbar multifidi/glutes/QL   Rennie Natter, PT, DPT  12/31/2021, 12:01 PM

## 2022-01-05 ENCOUNTER — Encounter: Payer: Self-pay | Admitting: Physical Therapy

## 2022-01-05 ENCOUNTER — Ambulatory Visit: Payer: Medicare PPO | Admitting: Physical Therapy

## 2022-01-05 DIAGNOSIS — M6281 Muscle weakness (generalized): Secondary | ICD-10-CM

## 2022-01-05 DIAGNOSIS — M5459 Other low back pain: Secondary | ICD-10-CM | POA: Diagnosis not present

## 2022-01-05 DIAGNOSIS — R252 Cramp and spasm: Secondary | ICD-10-CM

## 2022-01-05 NOTE — Therapy (Signed)
OUTPATIENT PHYSICAL THERAPY TREATMENT   Patient Name: Heidi Lutz MRN: [id number] DOB:[birth year], 81 y.o., female Today's Date: 01/05/2022   PT End of Session - 01/05/22 1322     Visit Number 9    Number of Visits 25    Date for PT Re-Evaluation 03/16/22    Authorization Type [employer] M[employer]    Progress Note Due on Visit 32    PT Start Time 1319    PT Stop Time 1400    PT Time Calculation (min) 41 min    Activity Tolerance Patient tolerated treatment well    Behavior During Therapy WFL for tasks assessed/performed             Past Medical History:  Diagnosis Date   Cancer ([place]) 01/2017   left breast cancer, basal cell and 1 melanoma   Complication of anesthesia    heart rate spikeed in the PACU on her 2nd knee surgery   Environmental allergies    High cholesterol    OAB (overactive bladder) 02/2020   Osteoarthritis    Personal history of radiation therapy    PONV (postoperative nausea and vomiting)    with 1st knee surgery had n/v, none since   Raynaud disease    Raynauds disease-takes procardia   Rheumatoid arthritis ([place])    Past Surgical History:  Procedure Laterality Date   ABDOMINAL HYSTERECTOMY  1972   APPENDECTOMY  1953   BREAST BIOPSY Left 05/19/2020   benign   BREAST LUMPECTOMY Left 2019   BREAST LUMPECTOMY WITH RADIOACTIVE SEED AND SENTINEL LYMPH NODE BIOPSY Left 04/07/2017   Procedure: BREAST LUMPECTOMY WITH RADIOACTIVE SEED AND SENTINEL LYMPH NODE BIOPSY;  Surgeon: Erroll Luna, MD;  Location: [facility];  Service: General;  Laterality: Left;   BUNIONECTOMY  1999   COLONOSCOPY     EYE SURGERY     bil cataract   GANGLION CYST EXCISION  1968   JOINT REPLACEMENT Left 2017    reverse shoulder replacement   REPLACEMENT TOTAL KNEE  2012, 2013   Right and Left   SHOULDER ARTHROSCOPY Left    [place]   Patient Active Problem List   Diagnosis Date Noted   Rheumatoid arthritis ([place])    Raynaud disease    PONV  (postoperative nausea and vomiting)    Personal history of radiation therapy    Osteoarthritis    High cholesterol    Environmental allergies    Complication of anesthesia    OAB (overactive bladder) 02/2020   Age-related osteoporosis without current pathological fracture 10/31/2019   History of vertebral compression fracture 07/16/2019   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   Pleuritic chest pain 04/10/2018   GAD (generalized anxiety disorder) 04/25/2017   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   LBBB (left bundle branch block) 03/22/2017   Pre-operative cardiovascular examination 03/22/2017   Mixed hyperlipidemia 03/22/2017   Malignant neoplasm of central portion of left breast in female, estrogen receptor positive ([place]) 03/16/2017   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/2017   Osteopenia 03/03/2017   Cancer ([place]) 01/2017   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   Lumbar degenerative disc disease 02/27/2015   Lumbar spinal stenosis 02/27/2015   Osteoarthritis of spine with radiculopathy, lumbar region 02/27/2015   Basal cell carcinoma 12/15/2014   Closed compression fracture of L1 lumbar vertebra, with routine healing, subsequent encounter 11/26/2013   Insomnia 02/02/2010   Hyperlipidemia 04/22/2009   Essential hypertension 09/19/2008  Allergic rhinitis 07/31/2007   IBS (irritable bowel syndrome) 08/18/2006   Raynaud's disease 08/18/2006    PCP: Heidi Rasmussen, MD  REFERRING PROVIDER: [name], MD  REFERRING DIAG: [phone] (ICD-10-CM) - Lumbar stenosis  Rationale for Evaluation and Treatment Rehabilitation  THERAPY DIAG:  Other low back pain  Cramp and spasm  Muscle weakness (generalized)  ONSET DATE: chronic since age 69  SUBJECTIVE:                                                                                                                                                                                            SUBJECTIVE STATEMENT: Heidi Lutz reports this is the first week in 40 years that she has been relatively pain free.  She was still very active and other than occasionally needing to "perch" has not had any difficulty and it has been wonderful.    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  PAIN:  Are you having pain? Yes: NPRS scale: 0/10 Pain location: low back  Pain description: constant pain also gets "screaming meemees" 25/10 Aggravating factors: pressure/weather changes, walking more than 10 min, prolonged standing >10 min  Relieving factors: sitting down   PRECAUTIONS: None  WEIGHT BEARING RESTRICTIONS No  FALLS:  Has patient fallen in last 6 months? No  LIVING ENVIRONMENT: Lives with: lives with their spouse Lives in: House/apartment Stairs:  3rd floor apartment, lives in [place] Has following equipment at home: Gilford Rile - 2 wheeled  OCCUPATION: retired  PLOF: Independent  PATIENT GOALS improve pain   OBJECTIVE:   DIAGNOSTIC FINDINGS:  04/24/2019 COMPARISON: December 2016. September 2018.    INDICATION: Wedge compression fracture of unspecified thoracic vertebra, initial encounter for closed fracture (#).   TECHNIQUE: MRI SPINE THORACIC WO IV CONTRAST-- Multiplanar, multisequence MR imaging of the thoracic spine performed.   FINDINGS:  Osseous structures: Chronic mild L1 vertebral body compression fracture. Chronic minimal T11 inferior vertebral body compression fracture.  Alignment: Alignment maintained.  Spinal cord: No intrinsic spinal cord abnormality.  Paraspinal tissues: Unremarkable.  Contrast: None given.  Incidental: Trace right pleural  effusion. Small seroma left breast.   DISC SPACES:  Incompletely characterized severe lower cervical spine DDD/facet arthrosis/uncovertebral joint spurring. Moderate left posterior lateral quadrants disc protrusion at T11-T12. Tiny scattered thoracic spine disc bulges/protrusions mostly concentrated in the upper thoracic spine.  PATIENT SURVEYS:  Modified Oswestry 19/50= 38% disability   SCREENING FOR RED FLAGS: Bowel or bladder incontinence: No Spinal tumors: No Cauda equina syndrome: No Compression fracture: Yes: closed L1 compression fracture Abdominal aneurysm: No  COGNITION:  Overall cognitive status: Within functional limits for tasks assessed     SENSATION: No numbness or parasthesias  MUSCLE LENGTH: Hamstrings: Right 90 deg; Left 90 deg  POSTURE: rounded shoulders, forward head, decreased lumbar lordosis, and increased thoracic kyphosis, forward flexed posture   PALPATION: Tenderness throughout lumbar paraspinals, QL, glutes, piriformis bil.  Decreased mobility throughout lumbar spine, decreased lordotic curvature but no scoliosis.   LUMBAR ROM:   Active  AROM  eval  Flexion To knees  Extension To neutral *  Right lateral flexion To knees *  Left lateral flexion To knees *  Right rotation WNL  Left rotation WNL   (Blank rows = not tested)  *increased pain  LOWER EXTREMITY ROM:    good hip mobility bil for IR/ER  LOWER EXTREMITY MMT:    MMT Right* eval Left* eval  Hip flexion 4+ 4+  Hip extension 5 5  Hip abduction 5 5  Hip adduction 5 5  Knee flexion 5 5  Knee extension 5 5  Ankle dorsiflexion 5 5  Ankle plantarflexion 5 5   (Blank rows = not tested) *tested in sitting  LUMBAR SPECIAL TESTS:  Straight leg raise test: Negative and FABER test: Negative  FUNCTIONAL TESTS:  5 times sit to stand: 15 seconds  GAIT: Distance walked: 88' Assistive device utilized: None Level of assistance: Complete Independence Comments: forward flexed posture with  gait.      TODAY'S TREATMENT  01/05/2022    Therapeutic Exercise: to improve strength and mobility.   Treadmill x 6 min, 3.5 grade, 2.1 mph  SBA for safety Manual Therapy: to decrease muscle spasm and pain and improve mobility STM/TPR to bil lumbar paraspinals, R UPA mobs, STM/TPR to bil glutes, piriformis, and QL; skilled palpation and monitoring during dry needling. Trigger Point Dry-Needling  Treatment instructions: Expect mild to moderate muscle soreness. S/S of pneumothorax if dry needled over a lung field, and to seek immediate medical attention should they occur. Patient verbalized understanding of these instructions and education.  Patient Consent Given: Yes Education handout provided: Yes Muscles treated: bil lumbar multifidi L2-L5,bil glut med, &  piriformis Electrical stimulation performed: No Parameters: N/A Treatment response/outcome: Twitch Response Elicited and Palpable Increase in Muscle Length Therapeutic Activity:  review of progress and goals.    12/31/2021  Therapeutic Exercise: to improve strength and mobility.   Treadmill x 6 min, 3.5 grade, 2.1 mph  SBA for safety Manual Therapy: to decrease muscle spasm and pain and improve mobility STM/TPR to bil lumbar paraspinals, R UPA mobs, STM/TPR to bil glutes, piriformis, and QL; skilled palpation and monitoring during dry needling. Trigger Point Dry-Needling  Treatment instructions: Expect mild to moderate muscle soreness. S/S of pneumothorax if dry needled over a lung field, and to seek immediate medical attention should they occur. Patient verbalized understanding of these instructions and education.  Patient Consent Given: Yes Education handout provided: Yes Muscles treated: bil lumbar multifidi L2-L5,bil glut med, &  piriformis Electrical stimulation performed: No Parameters: N/A Treatment response/outcome: Twitch Response Elicited and Palpable Increase in Muscle Length  12/29/2021 Therapeutic Exercise: to  improve strength and mobility.  Demo, verbal and tactile cues throughout for technique. Treadmill x 6 min, 3.5 grade, 2.1 mph Manual Therapy: to decrease muscle spasm and pain and improve mobility STM/TPR  to lumbar paraspinals, R UPA mobs, STM/TPR to R glutes, piriformis, and QL skilled palpation and monitoring during dry needling. Trigger Point Dry-Needling  Treatment instructions: Expect mild to moderate muscle soreness. S/S of pneumothorax if dry needled over a lung field, and to seek immediate medical attention should they occur. Patient verbalized understanding of these instructions and education.  Patient Consent Given: Yes Education handout provided: Yes Muscles treated: bil lumbar multifidi L2-L5, R glut med, R  piriformis, R QL Electrical stimulation performed: No Parameters: N/A Treatment response/outcome: Twitch Response Elicited and Palpable Increase in Muscle Length  12/24/2021 Therapeutic Exercise: to improve strength and mobility.  Demo, verbal and tactile cues throughout for technique. Treadmill x 6 min 4.0 grade, 2.0 mph Supine pelvic tilts x 10 Supine bridges x 10 Supine SLR x 10 bil  Supine clams RTB x 20 Manual Therapy: to decrease muscle spasm and pain and improve mobility STM/TPR to lumbar paraspinals, R UPA mobs, STM/TPR to bil  glutes and piriformis, skilled palpation and monitoring during dry needling. Trigger Point Dry-Needling  Treatment instructions: Expect mild to moderate muscle soreness. S/S of pneumothorax if dry needled over a lung field, and to seek immediate medical attention should they occur. Patient verbalized understanding of these instructions and education.  Patient Consent Given: Yes Education handout provided: Yes Muscles treated: bil lumbar multifidi L2-L5, bil glut med, bil  piriformis Electrical stimulation performed: No Parameters: N/A Treatment response/outcome: Twitch Response Elicited and Palpable Increase in Muscle Length    PATIENT  EDUCATION:  Education details: continue HEP as tolerated, sleeping position with pillow under L side to take pressure off L shoulder Person educated: Patient and Child(ren) Education method: Explanation and Demonstration Education comprehension: verbalized understanding and returned demonstration   HOME EXERCISE PROGRAM: Access Code: [email or web address] URL: [email or web address] Date: 12/14/2021 Prepared by: Glenetta Hew  Exercises - Supine Lower Trunk Rotation  - 1 x daily - 7 x weekly - 2 sets - 10 reps - Supine Posterior Pelvic Tilt  - 1 x daily - 7 x weekly - 2 sets - 10 reps - Seated Posterior Pelvic Tilt  - 1 x daily - 7 x weekly - 2 sets - 10 reps  ASSESSMENT:  CLINICAL IMPRESSION: Kiala Faraj reports that physical therapy has given her the most pain relief in years and she is thrilled not to have to live in constant severe pain or consider back surgery.  She is able to do more now, but is still limited with prolonged standing and walking.  She is planning on trying to start walking on treadmill at gym at [place].   Today again focused on incline walking followed by manual therapy, which she tolerated well, noted that even her bed mobility is improving, able to transition from prone back into sitting with less difficulty.  Derrek Monaco would benefit from skilled physical therapy to decrease low back pain and improve activity tolerance.  Recommend extending POC for additional 10 weeks, decreasing visits to 1x/week to continue to manage pain and improve functional ability.    OBJECTIVE IMPAIRMENTS decreased activity tolerance, difficulty walking, decreased strength, increased muscle spasms, improper body mechanics, postural dysfunction, and pain.   ACTIVITY LIMITATIONS lifting, standing, stairs, and locomotion level  PARTICIPATION LIMITATIONS: meal prep, cleaning, and community activity  PERSONAL FACTORS Time since onset of injury/illness/exacerbation  and 3+ comorbidities: lumbar stenosis, RA, OA, Raynauds, history breast cancer, abdominal hysterectomy  are also affecting patient's functional outcome.   REHAB POTENTIAL: Good  CLINICAL DECISION MAKING: Evolving/moderate  complexity  EVALUATION COMPLEXITY: Moderate   GOALS: Goals reviewed with patient? Yes  SHORT TERM GOALS: Target date: 12/08/2021   Patient will be independent with initial HEP.  Baseline: needs Goal status: MET 12/14/21- given 12/17/21- good compliance but cues needed for seated pelvic tilts    LONG TERM GOALS: Target date: 01/05/2022  extended to 03/16/2022.   Patient will be independent with advanced/ongoing HEP to improve outcomes and carryover.  Baseline: needs progression  Goal status: MET 12/29/2021- completes 2x/day  2.  Patient will report 50% improvement in low back pain to improve QOL.  Baseline: 6-25/10 low back pain Goal status: MET  01/05/22- 75% improvement  3.  Patient will be able to stand/walk with upright posture without increased LBP.   Baseline: forward flexed posture due to pian Goal status: IN PROGRESS 01/05/22 - able to walk short distances now without pain.    4.   Patient will report 12% improvement on Oswestry to demonstrate improved functional ability.  Baseline: 38% disability Goal status: IN PROGRESS  01/05/22- 34%  5.  Patient will tolerate 15 min of standing to perform ADLs. Baseline: 10 minutes before needing to sit Goal status: IN PROGRESS 01/05/22 - still limited to ~ 10 min   6.  Patient will tolerate 15 min walking on treadmill to improve activity tolerance and exercise.  Baseline: 10 min max Goal status: IN PROGRESS 01/05/22- 6 minutes on treadmill  PLAN: PT FREQUENCY: 1-2x/week  PT DURATION: 10 weeks to 03/16/2022.   PLANNED INTERVENTIONS: Therapeutic exercises, Therapeutic activity, Neuromuscular re-education, Balance training, Gait training, Patient/Family education, Self Care, Joint mobilization, Dry Needling,  Electrical stimulation, Spinal mobilization, Manual therapy, and Re-evaluation.  PLAN FOR NEXT SESSION: review core strengthening focusing on flexion/neutral and TrA activation, manual therapy/TrDN to lumbar multifidi/glutes/QL.  Continue to progress activity tolerance.    Rennie Natter, PT, DPT  01/05/2022, 6:10 PM

## 2022-01-26 ENCOUNTER — Ambulatory Visit: Payer: Medicare PPO | Attending: Family Medicine | Admitting: Physical Therapy

## 2022-01-26 ENCOUNTER — Encounter: Payer: Self-pay | Admitting: Physical Therapy

## 2022-01-26 DIAGNOSIS — R252 Cramp and spasm: Secondary | ICD-10-CM | POA: Diagnosis present

## 2022-01-26 DIAGNOSIS — M5459 Other low back pain: Secondary | ICD-10-CM | POA: Insufficient documentation

## 2022-01-26 DIAGNOSIS — M6281 Muscle weakness (generalized): Secondary | ICD-10-CM | POA: Insufficient documentation

## 2022-01-26 NOTE — Therapy (Signed)
OUTPATIENT PHYSICAL THERAPY TREATMENT   Patient Name: Heidi Lutz MRN: [id number] DOB:[birth year], 81 y.o., female Today's Date: 01/26/2022   PT End of Session - 01/26/22 1616     Visit Number 10    Number of Visits 25    Date for PT Re-Evaluation 03/16/22    Authorization Type [employer] MCR    Progress Note Due on Visit 73    PT Start Time 3299    PT Stop Time 2426    PT Time Calculation (min) 40 min    Activity Tolerance Patient tolerated treatment well    Behavior During Therapy WFL for tasks assessed/performed             Past Medical History:  Diagnosis Date   Cancer ([place]) 01/2017   left breast cancer, basal cell and 1 melanoma   Complication of anesthesia    heart rate spikeed in the PACU on her 2nd knee surgery   Environmental allergies    High cholesterol    OAB (overactive bladder) 02/2020   Osteoarthritis    Personal history of radiation therapy    PONV (postoperative nausea and vomiting)    with 1st knee surgery had n/v, none since   Raynaud disease    Raynauds disease-takes procardia   Rheumatoid arthritis ([place])    Past Surgical History:  Procedure Laterality Date   ABDOMINAL HYSTERECTOMY  1972   APPENDECTOMY  1953   BREAST BIOPSY Left 05/19/2020   benign   BREAST LUMPECTOMY Left 2019   BREAST LUMPECTOMY WITH RADIOACTIVE SEED AND SENTINEL LYMPH NODE BIOPSY Left 04/07/2017   Procedure: BREAST LUMPECTOMY WITH RADIOACTIVE SEED AND SENTINEL LYMPH NODE BIOPSY;  Surgeon: Erroll Luna, MD;  Location: [facility];  Service: General;  Laterality: Left;   BUNIONECTOMY  1999   COLONOSCOPY     EYE SURGERY     bil cataract   GANGLION CYST EXCISION  1968   JOINT REPLACEMENT Left 2017    reverse shoulder replacement   REPLACEMENT TOTAL KNEE  2012, 2013   Right and Left   SHOULDER ARTHROSCOPY Left    [place]   Patient Active Problem List   Diagnosis Date Noted   Rheumatoid arthritis ([place])    Raynaud disease    PONV  (postoperative nausea and vomiting)    Personal history of radiation therapy    Osteoarthritis    High cholesterol    Environmental allergies    Complication of anesthesia    OAB (overactive bladder) 02/2020   Age-related osteoporosis without current pathological fracture 10/31/2019   History of vertebral compression fracture 07/16/2019   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   Pleuritic chest pain 04/10/2018   GAD (generalized anxiety disorder) 04/25/2017   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   LBBB (left bundle branch block) 03/22/2017   Pre-operative cardiovascular examination 03/22/2017   Mixed hyperlipidemia 03/22/2017   Malignant neoplasm of central portion of left breast in female, estrogen receptor positive ([place]) 03/16/2017   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/2017   Osteopenia 03/03/2017   Cancer ([place]) 01/2017   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   Lumbar degenerative disc disease 02/27/2015   Lumbar spinal stenosis 02/27/2015   Osteoarthritis of spine with radiculopathy, lumbar region 02/27/2015   Basal cell carcinoma 12/15/2014   Closed compression fracture of L1 lumbar vertebra, with routine healing, subsequent encounter 11/26/2013   Insomnia 02/02/2010   Hyperlipidemia 04/22/2009   Essential hypertension 09/19/2008  Allergic rhinitis 07/31/2007   IBS (irritable bowel syndrome) 08/18/2006   Raynaud's disease 08/18/2006    PCP: [name], MD  REFERRING PROVIDER: [name], MD  REFERRING DIAG: [id number] (ICD-10-CM) - Lumbar stenosis  Rationale for Evaluation and Treatment Rehabilitation  THERAPY DIAG:  Other low back pain  Cramp and spasm  Muscle weakness (generalized)  ONSET DATE: chronic since age 69  SUBJECTIVE:                                                                                                                                                                                            SUBJECTIVE STATEMENT: Heidi Lutz reports that while her back is much better than it was in the past "I need to be here" and her back pain has increased over the last three weeks when she was not able to get into PT due to holidays.   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  PAIN:  Are you having pain? Yes: NPRS scale: 3-4/10 Pain location: low back up to 7/8 today when standing  Pain description: constant pain also gets "screaming meemees" 25/10 Aggravating factors: pressure/weather changes, walking more than 10 min, prolonged standing >10 min  Relieving factors: sitting down   PRECAUTIONS: None  WEIGHT BEARING RESTRICTIONS No  FALLS:  Has patient fallen in last 6 months? No  LIVING ENVIRONMENT: Lives with: lives with their spouse Lives in: House/apartment Stairs:  3rd floor apartment, lives in [place] Has following equipment at home: Gilford Rile - 2 wheeled  OCCUPATION: retired  PLOF: Independent  PATIENT GOALS improve pain   OBJECTIVE:   DIAGNOSTIC FINDINGS:  04/24/2019 COMPARISON: December 2016. September 2018.    INDICATION: Wedge compression fracture of unspecified thoracic vertebra, initial encounter for closed fracture (#).   TECHNIQUE: MRI SPINE THORACIC WO IV CONTRAST-- Multiplanar, multisequence MR imaging of the thoracic spine performed.   FINDINGS:  Osseous structures: Chronic mild L1 vertebral body compression fracture. Chronic minimal T11 inferior vertebral body compression fracture.  Alignment: Alignment maintained.  Spinal cord: No intrinsic spinal cord abnormality.  Paraspinal tissues: Unremarkable.  Contrast: None given.  Incidental:  Trace right pleural effusion. Small seroma left breast.   DISC SPACES:  Incompletely characterized severe lower cervical spine DDD/facet arthrosis/uncovertebral joint spurring. Moderate left posterior lateral quadrants disc protrusion at T11-T12. Tiny scattered thoracic spine disc bulges/protrusions mostly concentrated  in the upper thoracic spine.  PATIENT SURVEYS:  Modified Oswestry 19/50= 38% disability   SCREENING FOR RED FLAGS: Bowel or bladder incontinence: No Spinal tumors: No Cauda equina syndrome: No Compression fracture: Yes: closed L1 compression fracture Abdominal aneurysm: No  COGNITION:  Overall cognitive status: Within functional limits for tasks assessed     SENSATION: No numbness or parasthesias  MUSCLE LENGTH: Hamstrings: Right 90 deg; Left 90 deg  POSTURE: rounded shoulders, forward head, decreased lumbar lordosis, and increased thoracic kyphosis, forward flexed posture   PALPATION: Tenderness throughout lumbar paraspinals, QL, glutes, piriformis bil.  Decreased mobility throughout lumbar spine, decreased lordotic curvature but no scoliosis.   LUMBAR ROM:   Active  AROM  eval  Flexion To knees  Extension To neutral *  Right lateral flexion To knees *  Left lateral flexion To knees *  Right rotation WNL  Left rotation WNL   (Blank rows = not tested)  *increased pain  LOWER EXTREMITY ROM:    good hip mobility bil for IR/ER  LOWER EXTREMITY MMT:    MMT Right* eval Left* eval  Hip flexion 4+ 4+  Hip extension 5 5  Hip abduction 5 5  Hip adduction 5 5  Knee flexion 5 5  Knee extension 5 5  Ankle dorsiflexion 5 5  Ankle plantarflexion 5 5   (Blank rows = not tested) *tested in sitting  LUMBAR SPECIAL TESTS:  Straight leg raise test: Negative and FABER test: Negative  FUNCTIONAL TESTS:  5 times sit to stand: 15 seconds  GAIT: Distance walked: 62' Assistive device utilized: None Level of assistance: Complete Independence Comments: forward  flexed posture with gait.      TODAY'S TREATMENT  01/26/2022    Therapeutic Exercise: to improve strength and mobility.   Treadmill x 6 min, 4.0 grade, 2.1 mph  SBA for safety Manual Therapy: to decrease muscle spasm and pain and improve mobility STM/TPR to bil lumbar paraspinals, R UPA mobs, STM/TPR to bil glutes, piriformis, and QL; skilled palpation and monitoring during dry needling. Trigger Point Dry-Needling  Treatment instructions: Expect mild to moderate muscle soreness. S/S of pneumothorax if dry needled over a lung field, and to seek immediate medical attention should they occur. Patient verbalized understanding of these instructions and education.  Patient Consent Given: Yes Education handout provided: Yes Muscles treated: bil lumbar multifidi L2-L5,bil glut med, &  piriformis Electrical stimulation performed: No Parameters: N/A Treatment response/outcome: Twitch Response Elicited and Palpable Increase in Muscle Length  01/05/2022    Therapeutic Exercise: to improve strength and mobility.   Treadmill x 6 min, 3.5 grade, 2.1 mph  SBA for safety Manual Therapy: to decrease muscle spasm and pain and improve mobility STM/TPR to bil lumbar paraspinals, R UPA mobs, STM/TPR to bil glutes, piriformis, and QL; skilled palpation and monitoring during dry needling. Trigger Point Dry-Needling  Treatment instructions: Expect mild to moderate muscle soreness. S/S of pneumothorax if dry needled over a lung field, and to seek immediate medical attention should they occur. Patient verbalized understanding of these instructions and education.  Patient Consent Given: Yes Education handout provided: Yes Muscles treated: bil lumbar multifidi L2-L5,bil glut med, &  piriformis Electrical stimulation performed: No Parameters: N/A Treatment response/outcome: Twitch Response Elicited and Palpable Increase in Muscle Length Therapeutic Activity:  review of progress and goals.    12/31/2021   Therapeutic Exercise: to improve strength and mobility.   Treadmill x 6 min, 3.5 grade, 2.1 mph  SBA for safety Manual Therapy: to  decrease muscle spasm and pain and improve mobility STM/TPR to bil lumbar paraspinals, R UPA mobs, STM/TPR to bil glutes, piriformis, and QL; skilled palpation and monitoring during dry needling. Trigger Point Dry-Needling  Treatment instructions: Expect mild to moderate muscle soreness. S/S of pneumothorax if dry needled over a lung field, and to seek immediate medical attention should they occur. Patient verbalized understanding of these instructions and education.  Patient Consent Given: Yes Education handout provided: Yes Muscles treated: bil lumbar multifidi L2-L5,bil glut med, &  piriformis Electrical stimulation performed: No Parameters: N/A Treatment response/outcome: Twitch Response Elicited and Palpable Increase in Muscle Length   PATIENT EDUCATION:  Education details: continue HEP as tolerated, Person educated: Patient and Child(ren) Education method: Explanation and Demonstration Education comprehension: verbalized understanding and returned demonstration   HOME EXERCISE PROGRAM: Access Code: [email or web address] URL: [email or web address] Date: 12/14/2021 Prepared by: Glenetta Hew  Exercises - Supine Lower Trunk Rotation  - 1 x daily - 7 x weekly - 2 sets - 10 reps - Supine Posterior Pelvic Tilt  - 1 x daily - 7 x weekly - 2 sets - 10 reps - Seated Posterior Pelvic Tilt  - 1 x daily - 7 x weekly - 2 sets - 10 reps  ASSESSMENT:  CLINICAL IMPRESSION: Heidi Lutz reports increased pain over interval without PT.  She had multiple trigger points throughout lumbar paraspinals and glutes, and responded very well to manual therapy and TrDN today with decreased pain.   Heidi Lutz would benefit from skilled physical therapy to decrease low back pain and improve activity tolerance.    OBJECTIVE IMPAIRMENTS decreased  activity tolerance, difficulty walking, decreased strength, increased muscle spasms, improper body mechanics, postural dysfunction, and pain.   ACTIVITY LIMITATIONS lifting, standing, stairs, and locomotion level  PARTICIPATION LIMITATIONS: meal prep, cleaning, and community activity  PERSONAL FACTORS Time since onset of injury/illness/exacerbation and 3+ comorbidities: lumbar stenosis, RA, OA, Raynauds, history breast cancer, abdominal hysterectomy  are also affecting patient's functional outcome.   REHAB POTENTIAL: Good  CLINICAL DECISION MAKING: Evolving/moderate complexity  EVALUATION COMPLEXITY: Moderate   GOALS: Goals reviewed with patient? Yes  SHORT TERM GOALS: Target date: 12/08/2021   Patient will be independent with initial HEP.  Baseline: needs Goal status: MET 12/14/21- given 12/17/21- good compliance but cues needed for seated pelvic tilts    LONG TERM GOALS: Target date: 01/05/2022  extended to 03/16/2022.   Patient will be independent with advanced/ongoing HEP to improve outcomes and carryover.  Baseline: needs progression  Goal status: MET 12/29/2021- completes 2x/day  2.  Patient will report 50% improvement in low back pain to improve QOL.  Baseline: 6-25/10 low back pain Goal status: MET  01/05/22- 75% improvement  3.  Patient will be able to stand/walk with upright posture without increased LBP.   Baseline: forward flexed posture due to pian Goal status: IN PROGRESS 01/05/22 - able to walk short distances now without pain.    4.   Patient will report 12% improvement on Oswestry to demonstrate improved functional ability.  Baseline: 38% disability Goal status: IN PROGRESS  01/05/22- 34%  5.  Patient will tolerate 15 min of standing to perform ADLs. Baseline: 10 minutes before needing to sit Goal status: IN PROGRESS 01/05/22 - still limited to ~ 10 min   6.  Patient will tolerate 15 min walking on treadmill to improve activity tolerance and exercise.   Baseline: 10 min max Goal status: IN PROGRESS 01/05/22- 6 minutes on treadmill  PLAN: PT FREQUENCY:  1-2x/week  PT DURATION: 10 weeks to 03/16/2022.   PLANNED INTERVENTIONS: Therapeutic exercises, Therapeutic activity, Neuromuscular re-education, Balance training, Gait training, Patient/Family education, Self Care, Joint mobilization, Dry Needling, Electrical stimulation, Spinal mobilization, Manual therapy, and Re-evaluation.  PLAN FOR NEXT SESSION: review core strengthening focusing on flexion/neutral and TrA activation, manual therapy/TrDN to lumbar multifidi/glutes/QL.  Continue to progress activity tolerance.    Rennie Natter, PT, DPT  01/26/2022, 4:24 PM

## 2022-02-02 ENCOUNTER — Encounter: Payer: Self-pay | Admitting: Physical Therapy

## 2022-02-02 ENCOUNTER — Ambulatory Visit: Payer: Medicare PPO | Admitting: Physical Therapy

## 2022-02-02 DIAGNOSIS — R252 Cramp and spasm: Secondary | ICD-10-CM

## 2022-02-02 DIAGNOSIS — M5459 Other low back pain: Secondary | ICD-10-CM | POA: Diagnosis not present

## 2022-02-02 DIAGNOSIS — M6281 Muscle weakness (generalized): Secondary | ICD-10-CM

## 2022-02-02 NOTE — Therapy (Signed)
OUTPATIENT PHYSICAL THERAPY TREATMENT   Patient Name: Heidi Lutz MRN: [id number] DOB:[birth year], 81 y.o., female Today's Date: 02/02/2022   PT End of Session - 02/02/22 1525     Visit Number 11    Number of Visits 25    Date for PT Re-Evaluation 03/16/22    Authorization Type [employer] MCR    Progress Note Due on Visit 73    PT Start Time 1452    PT Stop Time 1520    PT Time Calculation (min) 28 min    Activity Tolerance Patient tolerated treatment well    Behavior During Therapy WFL for tasks assessed/performed             Past Medical History:  Diagnosis Date   Cancer ([place]) 01/2017   left breast cancer, basal cell and 1 melanoma   Complication of anesthesia    heart rate spikeed in the PACU on her 2nd knee surgery   Environmental allergies    High cholesterol    OAB (overactive bladder) 02/2020   Osteoarthritis    Personal history of radiation therapy    PONV (postoperative nausea and vomiting)    with 1st knee surgery had n/v, none since   Raynaud disease    Raynauds disease-takes procardia   Rheumatoid arthritis ([place])    Past Surgical History:  Procedure Laterality Date   ABDOMINAL HYSTERECTOMY  1972   APPENDECTOMY  1953   BREAST BIOPSY Left 05/19/2020   benign   BREAST LUMPECTOMY Left 2019   BREAST LUMPECTOMY WITH RADIOACTIVE SEED AND SENTINEL LYMPH NODE BIOPSY Left 04/07/2017   Procedure: BREAST LUMPECTOMY WITH RADIOACTIVE SEED AND SENTINEL LYMPH NODE BIOPSY;  Surgeon: Erroll Luna, MD;  Location: [facility] [place] [facility];  Service: General;  Laterality: Left;   BUNIONECTOMY  1999   COLONOSCOPY     EYE SURGERY     bil cataract   GANGLION CYST EXCISION  1968   JOINT REPLACEMENT Left 2017    reverse shoulder replacement   REPLACEMENT TOTAL KNEE  2012, 2013   Right and Left   SHOULDER ARTHROSCOPY Left    [place]   Patient Active Problem List   Diagnosis Date Noted   Rheumatoid arthritis ([place])    Raynaud disease    PONV  (postoperative nausea and vomiting)    Personal history of radiation therapy    Osteoarthritis    High cholesterol    Environmental allergies    Complication of anesthesia    OAB (overactive bladder) 02/2020   Age-related osteoporosis without current pathological fracture 10/31/2019   History of vertebral compression fracture 07/16/2019   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   Pleuritic chest pain 04/10/2018   GAD (generalized anxiety disorder) 04/25/2017   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   LBBB (left bundle branch block) 03/22/2017   Pre-operative cardiovascular examination 03/22/2017   Mixed hyperlipidemia 03/22/2017   Malignant neoplasm of central portion of left breast in female, estrogen receptor positive ([place]) 03/16/2017   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/2017   Osteopenia 03/03/2017   Cancer ([place]) 01/2017   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   Lumbar degenerative disc disease 02/27/2015   Lumbar spinal stenosis 02/27/2015   Osteoarthritis of spine with radiculopathy, lumbar region 02/27/2015   Basal cell carcinoma 12/15/2014   Closed compression fracture of L1 lumbar vertebra, with routine healing, subsequent encounter 11/26/2013   Insomnia 02/02/2010   Hyperlipidemia 04/22/2009   Essential hypertension 09/19/2008  Allergic rhinitis 07/31/2007   IBS (irritable bowel syndrome) 08/18/2006   Raynaud's disease 08/18/2006    PCP: [name], MD  REFERRING PROVIDER: [name], MD  REFERRING DIAG: [phone] (ICD-10-CM) - Lumbar stenosis  Rationale for Evaluation and Treatment Rehabilitation  THERAPY DIAG:  Other low back pain  Cramp and spasm  Muscle weakness (generalized)  ONSET DATE: chronic since age 8  SUBJECTIVE:                                                                                                                                                                                            SUBJECTIVE STATEMENT: Heidi Lutz reports her back has been doing very well except yesterday when she was carrying package heavier than it needed to be.    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  PAIN:  Are you having pain? Yes: NPRS scale: 0/10 Pain location: low back   Pain description: constant pain also gets "screaming meemees" 25/10 Aggravating factors: pressure/weather changes, walking more than 10 min, prolonged standing >10 min  Relieving factors: sitting down   PRECAUTIONS: None  WEIGHT BEARING RESTRICTIONS No  FALLS:  Has patient fallen in last 6 months? No  LIVING ENVIRONMENT: Lives with: lives with their spouse Lives in: House/apartment Stairs:  3rd floor apartment, lives in [place] Has following equipment at home: Gilford Rile - 2 wheeled  OCCUPATION: retired  PLOF: Independent  PATIENT GOALS improve pain   OBJECTIVE:   DIAGNOSTIC FINDINGS:  04/24/2019 COMPARISON: December 2016. September 2018.    INDICATION: Wedge compression fracture of unspecified thoracic vertebra, initial encounter for closed fracture (#).   TECHNIQUE: MRI SPINE THORACIC WO IV CONTRAST-- Multiplanar, multisequence MR imaging of the thoracic spine performed.   FINDINGS:  Osseous structures: Chronic mild L1 vertebral body compression fracture. Chronic minimal T11 inferior vertebral body compression fracture.  Alignment: Alignment maintained.  Spinal cord: No intrinsic spinal cord abnormality.  Paraspinal tissues: Unremarkable.  Contrast: None given.  Incidental: Trace right pleural effusion. Small seroma left breast.   DISC SPACES:  Incompletely characterized severe  lower cervical spine DDD/facet arthrosis/uncovertebral joint spurring. Moderate left posterior lateral quadrants disc protrusion at T11-T12. Tiny scattered thoracic spine disc bulges/protrusions mostly concentrated in the upper thoracic spine.  PATIENT SURVEYS:  Modified Oswestry 19/50= 38% disability   SCREENING FOR RED FLAGS: Bowel or bladder incontinence: No  Spinal tumors: No Cauda equina syndrome: No Compression fracture: Yes: closed L1 compression fracture Abdominal aneurysm: No  COGNITION:  Overall cognitive status: Within functional limits for tasks assessed     SENSATION: No numbness or parasthesias  MUSCLE LENGTH: Hamstrings: Right 90 deg; Left 90 deg  POSTURE: rounded shoulders, forward head, decreased lumbar lordosis, and increased thoracic kyphosis, forward flexed posture   PALPATION: Tenderness throughout lumbar paraspinals, QL, glutes, piriformis bil.  Decreased mobility throughout lumbar spine, decreased lordotic curvature but no scoliosis.   LUMBAR ROM:   Active  AROM  eval  Flexion To knees  Extension To neutral *  Right lateral flexion To knees *  Left lateral flexion To knees *  Right rotation WNL  Left rotation WNL   (Blank rows = not tested)  *increased pain  LOWER EXTREMITY ROM:    good hip mobility bil for IR/ER  LOWER EXTREMITY MMT:    MMT Right* eval Left* eval  Hip flexion 4+ 4+  Hip extension 5 5  Hip abduction 5 5  Hip adduction 5 5  Knee flexion 5 5  Knee extension 5 5  Ankle dorsiflexion 5 5  Ankle plantarflexion 5 5   (Blank rows = not tested) *tested in sitting  LUMBAR SPECIAL TESTS:  Straight leg raise test: Negative and FABER test: Negative  FUNCTIONAL TESTS:  5 times sit to stand: 15 seconds  GAIT: Distance walked: 58' Assistive device utilized: None Level of assistance: Complete Independence Comments: forward flexed posture with gait.      TODAY'S TREATMENT  02/02/2022  Manual Therapy: to decrease muscle spasm  and pain and improve mobility STM/TPR to bil lumbar paraspinals, R UPA mobs, STM/TPR to bil glutes, piriformis, and QL; skilled palpation and monitoring during dry needling. Trigger Point Dry-Needling  Treatment instructions: Expect mild to moderate muscle soreness. S/S of pneumothorax if dry needled over a lung field, and to seek immediate medical attention should they occur. Patient verbalized understanding of these instructions and education.  Patient Consent Given: Yes Education handout provided: Yes Muscles treated: bil lumbar multifidi L2-L5,bil glut med, &  piriformis Electrical stimulation performed: No Parameters: N/A Treatment response/outcome: Twitch Response Elicited and Palpable Increase in Muscle Length  Modalities: MHP to low back after session while waiting for husband in waiting area.  (Not included in treatment time).    01/26/2022    Therapeutic Exercise: to improve strength and mobility.   Treadmill x 6 min, 4.0 grade, 2.1 mph  SBA for safety Manual Therapy: to decrease muscle spasm and pain and improve mobility STM/TPR to bil lumbar paraspinals, R UPA mobs, STM/TPR to bil glutes, piriformis, and QL; skilled palpation and monitoring during dry needling. Trigger Point Dry-Needling  Treatment instructions: Expect mild to moderate muscle soreness. S/S of pneumothorax if dry needled over a lung field, and to seek immediate medical attention should they occur. Patient verbalized understanding of these instructions and education.  Patient Consent Given: Yes Education handout provided: Yes Muscles treated: bil lumbar multifidi L2-L5,bil glut med, &  piriformis Electrical stimulation performed: No Parameters: N/A Treatment response/outcome: Twitch Response Elicited and Palpable Increase in Muscle Length  01/05/2022    Therapeutic Exercise: to improve strength and mobility.   Treadmill x 6 min, 3.5 grade, 2.1 mph  SBA for safety Manual Therapy: to decrease muscle spasm and  pain and improve mobility STM/TPR to bil lumbar paraspinals, R UPA mobs, STM/TPR to bil glutes, piriformis, and QL; skilled palpation and monitoring during dry needling. Trigger Point Dry-Needling  Treatment instructions: Expect mild to moderate muscle soreness. S/S of pneumothorax if dry needled over a lung field, and to seek immediate medical attention should they occur. Patient verbalized understanding of these instructions and education.  Patient Consent Given: Yes Education handout provided: Yes Muscles treated: bil lumbar multifidi L2-L5,bil glut med, &  piriformis Electrical stimulation performed: No Parameters: N/A Treatment response/outcome: Twitch Response Elicited and Palpable Increase in Muscle Length Therapeutic Activity:  review of progress and goals.    PATIENT EDUCATION:  Education details: continue HEP as tolerated, Person educated: Patient and Child(ren) Education method: Customer service manager Education comprehension: verbalized understanding and returned demonstration   HOME EXERCISE PROGRAM: Access Code: [email or web address] URL: [email or web address] Date: 12/14/2021 Prepared by: Glenetta Hew  Exercises - Supine Lower Trunk Rotation  - 1 x daily - 7 x weekly - 2 sets - 10 reps - Supine Posterior Pelvic Tilt  - 1 x daily - 7 x weekly - 2 sets - 10 reps - Seated Posterior Pelvic Tilt  - 1 x daily - 7 x weekly - 2 sets - 10 reps  ASSESSMENT:  CLINICAL IMPRESSION: Heidi Lutz reports back is doing well, did not have significant increase in pain after 1 week since previous session.  She still demonstrated trigger points in low back and glutes, and responded well to manual therapy.  Challenged still with transitioning to/from prone position but no increase in pain with transition today.   Heidi Lutz would benefit from skilled physical therapy to decrease low back pain and improve activity tolerance.    OBJECTIVE IMPAIRMENTS  decreased activity tolerance, difficulty walking, decreased strength, increased muscle spasms, improper body mechanics, postural dysfunction, and pain.   ACTIVITY LIMITATIONS lifting, standing, stairs, and locomotion level  PARTICIPATION LIMITATIONS: meal prep, cleaning, and community activity  PERSONAL FACTORS Time since onset of injury/illness/exacerbation and 3+ comorbidities: lumbar stenosis, RA, OA, Raynauds, history breast cancer, abdominal hysterectomy  are also affecting patient's functional outcome.   REHAB POTENTIAL: Good  CLINICAL DECISION MAKING: Evolving/moderate complexity  EVALUATION COMPLEXITY: Moderate   GOALS: Goals reviewed with patient? Yes  SHORT TERM GOALS: Target date: 12/08/2021   Patient will be independent with initial HEP.  Baseline: needs Goal status: MET 12/14/21- given 12/17/21- good compliance but cues needed for seated pelvic tilts    LONG TERM GOALS: Target date: 01/05/2022  extended to 03/16/2022.   Patient will be independent with advanced/ongoing HEP to improve outcomes and carryover.  Baseline: needs progression  Goal status: MET 12/29/2021- completes 2x/day  2.  Patient will report 50% improvement in low back pain to improve QOL.  Baseline: 6-25/10 low back pain Goal status: MET  01/05/22- 75% improvement  3.  Patient will be able to stand/walk with upright posture without increased LBP.   Baseline: forward flexed posture due to pian Goal status: IN PROGRESS 01/05/22 - able to walk short distances now without pain.    4.   Patient will report 12% improvement on Oswestry to demonstrate improved functional ability.  Baseline: 38% disability Goal status: IN PROGRESS  01/05/22- 34%  5.  Patient will tolerate 15 min of standing to perform ADLs. Baseline: 10 minutes before needing to sit Goal status: IN PROGRESS 01/05/22 - still limited to ~ 10 min   6.  Patient will tolerate 15 min walking on treadmill to improve activity tolerance and  exercise.  Baseline: 10 min max Goal status: IN PROGRESS 01/05/22- 6 minutes on treadmill  PLAN: PT FREQUENCY: 1-2x/week  PT DURATION: 10  weeks to 03/16/2022.   PLANNED INTERVENTIONS: Therapeutic exercises, Therapeutic activity, Neuromuscular re-education, Balance training, Gait training, Patient/Family education, Self Care, Joint mobilization, Dry Needling, Electrical stimulation, Spinal mobilization, Manual therapy, and Re-evaluation.  PLAN FOR NEXT SESSION: review core strengthening focusing on flexion/neutral and TrA activation, manual therapy/TrDN to lumbar multifidi/glutes/QL.  Continue to progress activity tolerance.    Rennie Natter, PT, DPT  02/02/2022, 4:23 PM

## 2022-02-09 ENCOUNTER — Encounter: Payer: Self-pay | Admitting: Physical Therapy

## 2022-02-09 ENCOUNTER — Ambulatory Visit: Payer: Medicare PPO | Admitting: Physical Therapy

## 2022-02-09 DIAGNOSIS — R252 Cramp and spasm: Secondary | ICD-10-CM

## 2022-02-09 DIAGNOSIS — M5459 Other low back pain: Secondary | ICD-10-CM

## 2022-02-09 DIAGNOSIS — M6281 Muscle weakness (generalized): Secondary | ICD-10-CM

## 2022-02-09 NOTE — Therapy (Signed)
OUTPATIENT PHYSICAL THERAPY TREATMENT   Patient Name: Heidi Lutz MRN: [id number] DOB:Jun 23, 1940, 81 y.o., female Today's Date: 02/09/2022   PT End of Session - 02/09/22 1517     Visit Number 12    Number of Visits 25    Date for PT Re-Evaluation 03/16/22    Authorization Type [employer] MCR    Progress Note Due on Visit 65    PT Start Time 1447    PT Stop Time 1515    PT Time Calculation (min) 28 min    Activity Tolerance Patient tolerated treatment well    Behavior During Therapy WFL for tasks assessed/performed              Past Medical History:  Diagnosis Date   Cancer ([place]) 01/2017   left breast cancer, basal cell and 1 melanoma   Complication of anesthesia    heart rate spikeed in the PACU on her 2nd knee surgery   Environmental allergies    High cholesterol    OAB (overactive bladder) 02/2020   Osteoarthritis    Personal history of radiation therapy    PONV (postoperative nausea and vomiting)    with 1st knee surgery had n/v, none since   Raynaud disease    Raynauds disease-takes procardia   Rheumatoid arthritis ([place])    Past Surgical History:  Procedure Laterality Date   ABDOMINAL HYSTERECTOMY  1972   APPENDECTOMY  1953   BREAST BIOPSY Left 05/19/2020   benign   BREAST LUMPECTOMY Left 2019   BREAST LUMPECTOMY WITH RADIOACTIVE SEED AND SENTINEL LYMPH NODE BIOPSY Left 04/07/2017   Procedure: BREAST LUMPECTOMY WITH RADIOACTIVE SEED AND SENTINEL LYMPH NODE BIOPSY;  Surgeon: Erroll Luna, MD;  Location: [facility];  Service: General;  Laterality: Left;   BUNIONECTOMY  1999   COLONOSCOPY     EYE SURGERY     bil cataract   GANGLION CYST EXCISION  1968   JOINT REPLACEMENT Left 2017    reverse shoulder replacement   REPLACEMENT TOTAL KNEE  2012, 2013   Right and Left   SHOULDER ARTHROSCOPY Left    [place]   Patient Active Problem List   Diagnosis Date Noted   Rheumatoid arthritis ([place])    Raynaud disease    PONV  (postoperative nausea and vomiting)    Personal history of radiation therapy    Osteoarthritis    High cholesterol    Environmental allergies    Complication of anesthesia    OAB (overactive bladder) 02/2020   Age-related osteoporosis without current pathological fracture 10/31/2019   History of vertebral compression fracture 07/16/2019   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   Pleuritic chest pain 04/10/2018   GAD (generalized anxiety disorder) 04/25/2017   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   LBBB (left bundle branch block) 03/22/2017   Pre-operative cardiovascular examination 03/22/2017   Mixed hyperlipidemia 03/22/2017   Malignant neoplasm of central portion of left breast in female, estrogen receptor positive ([place]) 03/16/2017   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/2017   Osteopenia 03/03/2017   Cancer ([place]) 01/2017   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   Lumbar degenerative disc disease 02/27/2015   Lumbar spinal stenosis 02/27/2015   Osteoarthritis of spine with radiculopathy, lumbar region 02/27/2015   Basal cell carcinoma 12/15/2014   Closed compression fracture of L1 lumbar vertebra, with routine healing, subsequent encounter 11/26/2013   Insomnia 02/02/2010   Hyperlipidemia 04/22/2009   Essential hypertension 09/19/2008  Allergic rhinitis 07/31/2007   IBS (irritable bowel syndrome) 08/18/2006   Raynaud's disease 08/18/2006    PCP: [name], MD  REFERRING PROVIDER: [name], MD  REFERRING DIAG: [id number] (ICD-10-CM) - Lumbar stenosis  Rationale for Evaluation and Treatment Rehabilitation  THERAPY DIAG:  Other low back pain  Cramp and spasm  Muscle weakness (generalized)  ONSET DATE: chronic since age 37  SUBJECTIVE:                                                                                                                                                                                            SUBJECTIVE STATEMENT: Heidi Lutz reported she was doing well until Sunday, where the cold rainy weather increased her pain significantly.  She also tripped over her husbands foot last night and hit her knee, so would prefer to skip exercise.   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  PAIN:  Are you having pain? Yes: NPRS scale: 5/10 Pain location: low back   Pain description: constant pain also gets "screaming meemees" 25/10 Aggravating factors: pressure/weather changes, walking more than 10 min, prolonged standing >10 min  Relieving factors: sitting down   PRECAUTIONS: None  WEIGHT BEARING RESTRICTIONS No  FALLS:  Has patient fallen in last 6 months? No  LIVING ENVIRONMENT: Lives with: lives with their spouse Lives in: House/apartment Stairs:  3rd floor apartment, lives in [place] Has following equipment at home: Gilford Rile - 2 wheeled  OCCUPATION: retired  PLOF: Independent  PATIENT GOALS improve pain   OBJECTIVE:   DIAGNOSTIC FINDINGS:  04/24/2019 COMPARISON: December 2016. September 2018.    INDICATION: Wedge compression fracture of unspecified thoracic vertebra, initial encounter for closed fracture (#).   TECHNIQUE: MRI SPINE THORACIC WO IV CONTRAST-- Multiplanar, multisequence MR imaging of the thoracic spine performed.   FINDINGS:  Osseous structures: Chronic mild L1 vertebral body compression fracture. Chronic minimal T11 inferior vertebral body compression fracture.  Alignment: Alignment maintained.  Spinal cord: No intrinsic spinal cord abnormality.  Paraspinal tissues: Unremarkable.  Contrast: None given.  Incidental: Trace right pleural  effusion. Small seroma left breast.   DISC SPACES:  Incompletely characterized severe lower cervical spine DDD/facet arthrosis/uncovertebral joint spurring. Moderate left posterior lateral quadrants disc protrusion at T11-T12. Tiny scattered thoracic spine disc bulges/protrusions mostly concentrated in the upper thoracic spine.  PATIENT SURVEYS:  Modified Oswestry  19/50= 38% disability   SCREENING FOR RED FLAGS: Bowel or bladder incontinence: No Spinal tumors: No Cauda equina syndrome: No Compression fracture: Yes: closed L1 compression fracture Abdominal aneurysm: No  COGNITION:  Overall cognitive status: Within functional limits for tasks assessed     SENSATION: No numbness or parasthesias  MUSCLE LENGTH: Hamstrings: Right 90 deg; Left 90 deg  POSTURE: rounded shoulders, forward head, decreased lumbar lordosis, and increased thoracic kyphosis, forward flexed posture   PALPATION: Tenderness throughout lumbar paraspinals, QL, glutes, piriformis bil.  Decreased mobility throughout lumbar spine, decreased lordotic curvature but no scoliosis.   LUMBAR ROM:   Active  AROM  eval  Flexion To knees  Extension To neutral *  Right lateral flexion To knees *  Left lateral flexion To knees *  Right rotation WNL  Left rotation WNL   (Blank rows = not tested)  *increased pain  LOWER EXTREMITY ROM:    good hip mobility bil for IR/ER  LOWER EXTREMITY MMT:    MMT Right* eval Left* eval  Hip flexion 4+ 4+  Hip extension 5 5  Hip abduction 5 5  Hip adduction 5 5  Knee flexion 5 5  Knee extension 5 5  Ankle dorsiflexion 5 5  Ankle plantarflexion 5 5   (Blank rows = not tested) *tested in sitting  LUMBAR SPECIAL TESTS:  Straight leg raise test: Negative and FABER test: Negative  FUNCTIONAL TESTS:  5 times sit to stand: 15 seconds  GAIT: Distance walked: 71' Assistive device utilized: None Level of assistance: Complete Independence Comments: forward flexed posture with  gait.      TODAY'S TREATMENT  02/09/2022  Manual Therapy: to decrease muscle spasm and pain and improve mobility STM/TPR to bil lumbar paraspinals, R UPA mobs, STM/TPR to bil glutes, piriformis, and QL; skilled palpation and monitoring during dry needling. Trigger Point Dry-Needling  Treatment instructions: Expect mild to moderate muscle soreness. S/S of pneumothorax if dry needled over a lung field, and to seek immediate medical attention should they occur. Patient verbalized understanding of these instructions and education.  Patient Consent Given: Yes Education handout provided: Yes Muscles treated: bil lumbar multifidi L2-L5,bil glut med, &  piriformis Electrical stimulation performed: No Parameters: N/A Treatment response/outcome: Twitch Response Elicited and Palpable Increase in Muscle Length Modalities: MHP to low back after session while waiting for husband in waiting area.  (Not included in treatment time).   02/02/2022  Manual Therapy: to decrease muscle spasm and pain and improve mobility STM/TPR to bil lumbar paraspinals, R UPA mobs, STM/TPR to bil glutes, piriformis, and QL; skilled palpation and monitoring during dry needling. Trigger Point Dry-Needling  Treatment instructions: Expect mild to moderate muscle soreness. S/S of pneumothorax if dry needled over a lung field, and to seek immediate medical attention should they occur. Patient verbalized understanding of these instructions and education.  Patient Consent Given: Yes Education handout provided: Yes Muscles treated: bil lumbar multifidi L2-L5,bil glut med, &  piriformis Electrical stimulation performed: No Parameters: N/A Treatment response/outcome: Twitch Response Elicited and Palpable Increase in Muscle Length  Modalities: MHP to low back after session while waiting for husband in waiting area.  (Not included in treatment time).    01/26/2022    Therapeutic Exercise: to improve strength and mobility.    Treadmill x 6 min, 4.0 grade, 2.1 mph  SBA for safety Manual Therapy: to decrease muscle spasm and pain and improve mobility STM/TPR to bil lumbar paraspinals, R UPA mobs, STM/TPR to bil glutes, piriformis,  and QL; skilled palpation and monitoring during dry needling. Trigger Point Dry-Needling  Treatment instructions: Expect mild to moderate muscle soreness. S/S of pneumothorax if dry needled over a lung field, and to seek immediate medical attention should they occur. Patient verbalized understanding of these instructions and education.  Patient Consent Given: Yes Education handout provided: Yes Muscles treated: bil lumbar multifidi L2-L5,bil glut med, &  piriformis Electrical stimulation performed: No Parameters: N/A Treatment response/outcome: Twitch Response Elicited and Palpable Increase in Muscle Length  PATIENT EDUCATION:  Education details: continue HEP as tolerated, Person educated: Patient and Child(ren) Education method: Explanation and Demonstration Education comprehension: verbalized understanding and returned demonstration   HOME EXERCISE PROGRAM: Access Code: [email or web address] URL: [email or web address] Date: 12/14/2021 Prepared by: Glenetta Hew  Exercises - Supine Lower Trunk Rotation  - 1 x daily - 7 x weekly - 2 sets - 10 reps - Supine Posterior Pelvic Tilt  - 1 x daily - 7 x weekly - 2 sets - 10 reps - Seated Posterior Pelvic Tilt  - 1 x daily - 7 x weekly - 2 sets - 10 reps  ASSESSMENT:  CLINICAL IMPRESSION: Heidi Lutz reports increased pain today after change in weather systems, but after manual therapy reported no pain.  Focused on manual therapy today due to soreness in knee from tripping.  Heidi Lutz would benefit from skilled physical therapy to decrease low back pain and improve activity tolerance.    OBJECTIVE IMPAIRMENTS decreased activity tolerance, difficulty walking, decreased strength, increased muscle spasms,  improper body mechanics, postural dysfunction, and pain.   ACTIVITY LIMITATIONS lifting, standing, stairs, and locomotion level  PARTICIPATION LIMITATIONS: meal prep, cleaning, and community activity  PERSONAL FACTORS Time since onset of injury/illness/exacerbation and 3+ comorbidities: lumbar stenosis, RA, OA, Raynauds, history breast cancer, abdominal hysterectomy  are also affecting patient's functional outcome.   REHAB POTENTIAL: Good  CLINICAL DECISION MAKING: Evolving/moderate complexity  EVALUATION COMPLEXITY: Moderate   GOALS: Goals reviewed with patient? Yes  SHORT TERM GOALS: Target date: 12/08/2021   Patient will be independent with initial HEP.  Baseline: needs Goal status: MET 12/14/21- given 12/17/21- good compliance but cues needed for seated pelvic tilts    LONG TERM GOALS: Target date: 01/05/2022  extended to 03/16/2022.   Patient will be independent with advanced/ongoing HEP to improve outcomes and carryover.  Baseline: needs progression  Goal status: MET 12/29/2021- completes 2x/day  2.  Patient will report 50% improvement in low back pain to improve QOL.  Baseline: 6-25/10 low back pain Goal status: MET  01/05/22- 75% improvement  3.  Patient will be able to stand/walk with upright posture without increased LBP.   Baseline: forward flexed posture due to pian Goal status: IN PROGRESS 01/05/22 - able to walk short distances now without pain.    4.   Patient will report 12% improvement on Oswestry to demonstrate improved functional ability.  Baseline: 38% disability Goal status: IN PROGRESS  01/05/22- 34%  5.  Patient will tolerate 15 min of standing to perform ADLs. Baseline: 10 minutes before needing to sit Goal status: IN PROGRESS 01/05/22 - still limited to ~ 10 min   6.  Patient will tolerate 15 min walking on treadmill to improve activity tolerance and exercise.  Baseline: 10 min max Goal status: IN PROGRESS 01/05/22- 6 minutes on  treadmill  PLAN: PT FREQUENCY: 1-2x/week  PT DURATION: 10 weeks to 03/16/2022.   PLANNED INTERVENTIONS: Therapeutic exercises, Therapeutic activity, Neuromuscular re-education, Balance training, Gait training, Patient/Family education, Self  Care, Joint mobilization, Dry Needling, Electrical stimulation, Spinal mobilization, Manual therapy, and Re-evaluation.  PLAN FOR NEXT SESSION: review core strengthening focusing on flexion/neutral and TrA activation, manual therapy/TrDN to lumbar multifidi/glutes/QL.  Continue to progress activity tolerance.    Rennie Natter, PT, DPT  02/09/2022, 4:08 PM

## 2022-02-23 ENCOUNTER — Encounter: Payer: Medicare PPO | Admitting: Physical Therapy

## 2022-03-02 ENCOUNTER — Encounter: Payer: Self-pay | Admitting: Physical Therapy

## 2022-03-02 ENCOUNTER — Ambulatory Visit: Payer: Medicare PPO | Attending: Family Medicine | Admitting: Physical Therapy

## 2022-03-02 DIAGNOSIS — R252 Cramp and spasm: Secondary | ICD-10-CM | POA: Insufficient documentation

## 2022-03-02 DIAGNOSIS — M6281 Muscle weakness (generalized): Secondary | ICD-10-CM | POA: Insufficient documentation

## 2022-03-02 DIAGNOSIS — M5459 Other low back pain: Secondary | ICD-10-CM | POA: Insufficient documentation

## 2022-03-02 NOTE — Therapy (Signed)
OUTPATIENT PHYSICAL THERAPY TREATMENT   Patient Name: Heidi Lutz MRN: [id number] DOB:[birth year], 82 y.o., female Today's Date: 03/02/2022   PT End of Session - 03/02/22 1449     Visit Number 13    Number of Visits 25    Date for PT Re-Evaluation 03/16/22    Authorization Type [employer] MCR    Progress Note Due on Visit 56    PT Start Time 1403    PT Stop Time 2694    PT Time Calculation (min) 42 min    Activity Tolerance Patient tolerated treatment well    Behavior During Therapy [facility] for tasks assessed/performed              Past Medical History:  Diagnosis Date   Cancer ([place]) 01/2017   left breast cancer, basal cell and 1 melanoma   Complication of anesthesia    heart rate spikeed in the PACU on her 2nd knee surgery   Environmental allergies    High cholesterol    OAB (overactive bladder) 02/2020   Osteoarthritis    Personal history of radiation therapy    PONV (postoperative nausea and vomiting)    with 1st knee surgery had n/v, none since   Raynaud disease    Raynauds disease-takes procardia   Rheumatoid arthritis ([place])    Past Surgical History:  Procedure Laterality Date   ABDOMINAL HYSTERECTOMY  1972   APPENDECTOMY  1953   BREAST BIOPSY Left 05/19/2020   benign   BREAST LUMPECTOMY Left 2019   BREAST LUMPECTOMY WITH RADIOACTIVE SEED AND SENTINEL LYMPH NODE BIOPSY Left 04/07/2017   Procedure: BREAST LUMPECTOMY WITH RADIOACTIVE SEED AND SENTINEL LYMPH NODE BIOPSY;  Surgeon: Erroll Luna, MD;  Location: [place];  Service: General;  Laterality: Left;   BUNIONECTOMY  1999   COLONOSCOPY     EYE SURGERY     bil cataract   GANGLION CYST EXCISION  1968   JOINT REPLACEMENT Left 2017    reverse shoulder replacement   REPLACEMENT TOTAL KNEE  2012, 2013   Right and Left   SHOULDER ARTHROSCOPY Left    [place]   Patient Active Problem List   Diagnosis Date Noted   Rheumatoid arthritis ([place])    Raynaud disease    PONV  (postoperative nausea and vomiting)    Personal history of radiation therapy    Osteoarthritis    High cholesterol    Environmental allergies    Complication of anesthesia    OAB (overactive bladder) 02/2020   Age-related osteoporosis without current pathological fracture 10/31/2019   History of vertebral compression fracture 07/16/2019   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   Pleuritic chest pain 04/10/2018   GAD (generalized anxiety disorder) 04/25/2017   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   LBBB (left bundle branch block) 03/22/2017   Pre-operative cardiovascular examination 03/22/2017   Mixed hyperlipidemia 03/22/2017   Malignant neoplasm of central portion of left breast in female, estrogen receptor positive ([place]) 03/16/2017   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/2017   Osteopenia 03/03/2017   Cancer ([place]) 01/2017   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   Lumbar degenerative disc disease 02/27/2015   Lumbar spinal stenosis 02/27/2015   Osteoarthritis of spine with radiculopathy, lumbar region 02/27/2015   Basal cell carcinoma 12/15/2014   Closed compression fracture of L1 lumbar vertebra, with routine healing, subsequent encounter 11/26/2013   Insomnia 02/02/2010   Hyperlipidemia 04/22/2009   Essential hypertension 09/19/2008  Allergic rhinitis 07/31/2007   IBS (irritable bowel syndrome) 08/18/2006   Raynaud's disease 08/18/2006    PCP: Heidi Rasmussen, MD  REFERRING PROVIDER: [name], MD  REFERRING DIAG: [phone] (ICD-10-CM) - Lumbar stenosis  Rationale for Evaluation and Treatment Rehabilitation  THERAPY DIAG:  Other low back pain  Cramp and spasm  Muscle weakness (generalized)  ONSET DATE: chronic since age 77  SUBJECTIVE:                                                                                                                                                                                            SUBJECTIVE STATEMENT: Heidi Lutz reported she was doing well until yesterday, reports increased pain starting yesterday after having to sit at hospital all day while husband had a procedure.   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  PAIN:  Are you having pain? Yes: NPRS scale: 5/10 Pain location: low back   Pain description: constant pain also gets "screaming meemees" 25/10 Aggravating factors: pressure/weather changes, walking more than 10 min, prolonged standing >10 min  Relieving factors: sitting down   PRECAUTIONS: None  WEIGHT BEARING RESTRICTIONS No  FALLS:  Has patient fallen in last 6 months? No  LIVING ENVIRONMENT: Lives with: lives with their spouse Lives in: House/apartment Stairs:  3rd floor apartment, lives in [place] Has following equipment at home: Gilford Rile - 2 wheeled  OCCUPATION: retired  PLOF: Independent  PATIENT GOALS improve pain   OBJECTIVE:   DIAGNOSTIC FINDINGS:  04/24/2019 COMPARISON: December 2016. September 2018.    INDICATION: Wedge compression fracture of unspecified thoracic vertebra, initial encounter for closed fracture (#).   TECHNIQUE: MRI SPINE THORACIC WO IV CONTRAST-- Multiplanar, multisequence MR imaging of the thoracic spine performed.   FINDINGS:  Osseous structures: Chronic mild L1 vertebral body compression fracture. Chronic minimal T11 inferior vertebral body compression fracture.  Alignment: Alignment maintained.  Spinal cord: No intrinsic spinal cord abnormality.  Paraspinal tissues: Unremarkable.  Contrast: None given.  Incidental: Trace right pleural effusion. Small seroma left breast.   DISC SPACES:   Incompletely characterized severe lower cervical spine DDD/facet arthrosis/uncovertebral joint spurring. Moderate left posterior lateral quadrants disc protrusion at T11-T12. Tiny scattered thoracic spine disc bulges/protrusions mostly concentrated in the upper thoracic spine.  PATIENT SURVEYS:  Modified Oswestry 19/50= 38% disability   SCREENING FOR RED FLAGS: Bowel or  bladder incontinence: No Spinal tumors: No Cauda equina syndrome: No Compression fracture: Yes: closed L1 compression fracture Abdominal aneurysm: No  COGNITION:  Overall cognitive status: Within functional limits for tasks assessed     SENSATION: No numbness or parasthesias  MUSCLE LENGTH: Hamstrings: Right 90 deg; Left 90 deg  POSTURE: rounded shoulders, forward head, decreased lumbar lordosis, and increased thoracic kyphosis, forward flexed posture   PALPATION: Tenderness throughout lumbar paraspinals, QL, glutes, piriformis bil.  Decreased mobility throughout lumbar spine, decreased lordotic curvature but no scoliosis.   LUMBAR ROM:   Active  AROM  eval  Flexion To knees  Extension To neutral *  Right lateral flexion To knees *  Left lateral flexion To knees *  Right rotation WNL  Left rotation WNL   (Blank rows = not tested)  *increased pain  LOWER EXTREMITY ROM:    good hip mobility bil for IR/ER  LOWER EXTREMITY MMT:    MMT Right* eval Left* eval  Hip flexion 4+ 4+  Hip extension 5 5  Hip abduction 5 5  Hip adduction 5 5  Knee flexion 5 5  Knee extension 5 5  Ankle dorsiflexion 5 5  Ankle plantarflexion 5 5   (Blank rows = not tested) *tested in sitting  LUMBAR SPECIAL TESTS:  Straight leg raise test: Negative and FABER test: Negative  FUNCTIONAL TESTS:  5 times sit to stand: 15 seconds  GAIT: Distance walked: 70' Assistive device utilized: None Level of assistance: Complete Independence Comments: forward flexed posture with gait.      TODAY'S TREATMENT  03/02/2022    Manual Therapy: to decrease muscle spasm and pain and improve mobility STM/TPR to bil lumbar paraspinals, R UPA mobs, STM/TPR to bil glutes, piriformis, and QL; skilled palpation and monitoring during dry needling. Trigger Point Dry-Needling  Treatment instructions: Expect mild to moderate muscle soreness. S/S of pneumothorax if dry needled over a lung field, and to seek immediate medical attention should they occur. Patient verbalized understanding of these instructions and education.  Patient Consent Given: Yes Education handout provided: Yes Muscles treated: bil lumbar multifidi L2-L5,bil glut med, &  piriformis Electrical stimulation performed: No Parameters: N/A Treatment response/outcome: Twitch Response Elicited and Palpable Increase in Muscle Length   02/09/2022  Manual Therapy: to decrease muscle spasm and pain and improve mobility STM/TPR to bil lumbar paraspinals, R UPA mobs, STM/TPR to bil glutes, piriformis, and QL; skilled palpation and monitoring during dry needling. Trigger Point Dry-Needling  Treatment instructions: Expect mild to moderate muscle soreness. S/S of pneumothorax if dry needled over a lung field, and to seek immediate medical attention should they occur. Patient verbalized understanding of these instructions and education.  Patient Consent Given: Yes Education handout provided: Yes Muscles treated: bil lumbar multifidi L2-L5,bil glut med, &  piriformis Electrical stimulation performed: No Parameters: N/A Treatment response/outcome: Twitch Response Elicited and Palpable Increase in Muscle Length Modalities: MHP to low back after session while waiting for husband in waiting area.  (Not included in treatment time).   02/02/2022  Manual Therapy: to decrease muscle spasm and pain and improve mobility STM/TPR to bil lumbar paraspinals, R UPA mobs, STM/TPR to bil glutes, piriformis, and QL; skilled palpation and monitoring during dry needling. Trigger Point  Dry-Needling  Treatment instructions: Expect mild to moderate muscle soreness. S/S of pneumothorax if dry needled over a lung field, and to seek immediate medical attention should they occur. Patient verbalized understanding of these instructions and education.  Patient Consent Given: Yes Education handout  provided: Yes Muscles treated: bil lumbar multifidi L2-L5,bil glut med, &  piriformis Electrical stimulation performed: No Parameters: N/A Treatment response/outcome: Twitch Response Elicited and Palpable Increase in Muscle Length  Modalities: MHP to low back after session while waiting for husband in waiting area.  (Not included in treatment time).    PATIENT EDUCATION:  Education details: continue HEP as tolerated, Person educated: Patient and Child(ren) Education method: Customer service manager Education comprehension: verbalized understanding and returned demonstration   HOME EXERCISE PROGRAM: Access Code: [email or web address] URL: [email or web address] Date: 12/14/2021 Prepared by: Glenetta Hew  Exercises - Supine Lower Trunk Rotation  - 1 x daily - 7 x weekly - 2 sets - 10 reps - Supine Posterior Pelvic Tilt  - 1 x daily - 7 x weekly - 2 sets - 10 reps - Seated Posterior Pelvic Tilt  - 1 x daily - 7 x weekly - 2 sets - 10 reps  ASSESSMENT:  CLINICAL IMPRESSION: Heidi Lutz reports that she still has bleeding and soreness in right arm after skin cancer removed last week, so deferred exercise today and focused on manual therapy.  Reported significant improvement after interventions.   Heidi Lutz would benefit from skilled physical therapy to decrease low back pain and improve activity tolerance.    OBJECTIVE IMPAIRMENTS decreased activity tolerance, difficulty walking, decreased strength, increased muscle spasms, improper body mechanics, postural dysfunction, and pain.   ACTIVITY LIMITATIONS lifting, standing, stairs, and locomotion  level  PARTICIPATION LIMITATIONS: meal prep, cleaning, and community activity  PERSONAL FACTORS Time since onset of injury/illness/exacerbation and 3+ comorbidities: lumbar stenosis, RA, OA, Raynauds, history breast cancer, abdominal hysterectomy  are also affecting patient's functional outcome.   REHAB POTENTIAL: Good  CLINICAL DECISION MAKING: Evolving/moderate complexity  EVALUATION COMPLEXITY: Moderate   GOALS: Goals reviewed with patient? Yes  SHORT TERM GOALS: Target date: 12/08/2021   Patient will be independent with initial HEP.  Baseline: needs Goal status: MET 12/14/21- given 12/17/21- good compliance but cues needed for seated pelvic tilts    LONG TERM GOALS: Target date: 01/05/2022  extended to 03/16/2022.   Patient will be independent with advanced/ongoing HEP to improve outcomes and carryover.  Baseline: needs progression  Goal status: MET 12/29/2021- completes 2x/day  2.  Patient will report 50% improvement in low back pain to improve QOL.  Baseline: 6-25/10 low back pain Goal status: MET  01/05/22- 75% improvement  3.  Patient will be able to stand/walk with upright posture without increased LBP.   Baseline: forward flexed posture due to pian Goal status: IN PROGRESS 01/05/22 - able to walk short distances now without pain.    4.   Patient will report 12% improvement on Oswestry to demonstrate improved functional ability.  Baseline: 38% disability Goal status: IN PROGRESS  01/05/22- 34%  5.  Patient will tolerate 15 min of standing to perform ADLs. Baseline: 10 minutes before needing to sit Goal status: IN PROGRESS 01/05/22 - still limited to ~ 10 min   6.  Patient will tolerate 15 min walking on treadmill to improve activity tolerance and exercise.  Baseline: 10 min max Goal status: IN PROGRESS 01/05/22- 6 minutes on treadmill  PLAN: PT FREQUENCY: 1-2x/week  PT DURATION: 10 weeks to 03/16/2022.   PLANNED INTERVENTIONS: Therapeutic exercises,  Therapeutic activity, Neuromuscular re-education, Balance training, Gait training, Patient/Family education, Self Care, Joint mobilization, Dry Needling, Electrical stimulation, Spinal mobilization, Manual therapy, and Re-evaluation.  PLAN FOR NEXT SESSION: review core strengthening focusing on flexion/neutral and TrA activation, manual  therapy/TrDN to lumbar multifidi/glutes/QL.  Continue to progress activity tolerance.    Rennie Natter, PT, DPT  03/02/2022, 2:59 PM

## 2022-03-09 ENCOUNTER — Encounter: Payer: Medicare PPO | Admitting: Physical Therapy

## 2022-03-16 ENCOUNTER — Encounter: Payer: Self-pay | Admitting: Physical Therapy

## 2022-03-16 ENCOUNTER — Ambulatory Visit: Payer: Medicare PPO | Admitting: Physical Therapy

## 2022-03-16 DIAGNOSIS — M6281 Muscle weakness (generalized): Secondary | ICD-10-CM

## 2022-03-16 DIAGNOSIS — M5459 Other low back pain: Secondary | ICD-10-CM

## 2022-03-16 DIAGNOSIS — R252 Cramp and spasm: Secondary | ICD-10-CM

## 2022-03-16 NOTE — Therapy (Signed)
OUTPATIENT PHYSICAL THERAPY TREATMENT  Progress Note Reporting Period 01/05/2022 to 03/16/2022  See note below for Objective Data and Assessment of Progress/Goals.     Patient Name: Heidi Lutz MRN: [id number] DOB:[birth year], 82 y.o., female Today's Date: 03/16/2022   PT End of Session - 03/16/22 1519     Visit Number 14    Number of Visits 25    Date for PT Re-Evaluation 03/16/22    Authorization Type [employer] MCR    Progress Note Due on Visit 45    PT Start Time 1404    PT Stop Time 2297    PT Time Calculation (min) 54 min    Activity Tolerance Patient tolerated treatment well    Behavior During Therapy [facility] for tasks assessed/performed               Past Medical History:  Diagnosis Date   Cancer ([place]) 01/2017   left breast cancer, basal cell and 1 melanoma   Complication of anesthesia    heart rate spikeed in the PACU on her 2nd knee surgery   Environmental allergies    High cholesterol    OAB (overactive bladder) 02/2020   Osteoarthritis    Personal history of radiation therapy    PONV (postoperative nausea and vomiting)    with 1st knee surgery had n/v, none since   Raynaud disease    Raynauds disease-takes procardia   Rheumatoid arthritis ([place])    Past Surgical History:  Procedure Laterality Date   ABDOMINAL HYSTERECTOMY  1972   APPENDECTOMY  1953   BREAST BIOPSY Left 05/19/2020   benign   BREAST LUMPECTOMY Left 2019   BREAST LUMPECTOMY WITH RADIOACTIVE SEED AND SENTINEL LYMPH NODE BIOPSY Left 04/07/2017   Procedure: BREAST LUMPECTOMY WITH RADIOACTIVE SEED AND SENTINEL LYMPH NODE BIOPSY;  Surgeon: Erroll Luna, MD;  Location: [facility];  Service: General;  Laterality: Left;   BUNIONECTOMY  1999   COLONOSCOPY     EYE SURGERY     bil cataract   GANGLION CYST EXCISION  1968   JOINT REPLACEMENT Left 2017    reverse shoulder replacement   REPLACEMENT TOTAL KNEE  2012, 2013   Right and Left   SHOULDER ARTHROSCOPY Left    [place]   Patient Active Problem List   Diagnosis Date Noted   Rheumatoid arthritis ([place])    Raynaud disease    PONV (postoperative nausea and vomiting)    Personal history of radiation therapy    Osteoarthritis    High cholesterol    Environmental allergies    Complication of anesthesia    OAB (overactive bladder) 02/2020   Age-related osteoporosis without current pathological fracture 10/31/2019   History of vertebral compression fracture 07/16/2019   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   Pleuritic chest pain 04/10/2018   GAD (generalized anxiety disorder) 04/25/2017   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   LBBB (left bundle branch block) 03/22/2017   Pre-operative cardiovascular examination 03/22/2017   Mixed hyperlipidemia 03/22/2017   Malignant neoplasm of central portion of left breast in female, estrogen receptor positive ([place]) 03/16/2017   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/2017   Osteopenia 03/03/2017   Cancer ([place]) 01/2017   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   Lumbar degenerative disc disease 02/27/2015   Lumbar spinal stenosis 02/27/2015   Osteoarthritis of spine with radiculopathy, lumbar region 02/27/2015   Basal cell carcinoma 12/15/2014   Closed compression fracture of L1  lumbar vertebra, with routine healing, subsequent encounter 11/26/2013   Insomnia 02/02/2010   Hyperlipidemia 04/22/2009   Essential hypertension 09/19/2008   Allergic rhinitis 07/31/2007   IBS (irritable bowel syndrome) 08/18/2006   Raynaud's disease 08/18/2006    PCP: [name], MD  REFERRING PROVIDER: [name], MD  REFERRING DIAG: [phone] (ICD-10-CM) - Lumbar stenosis  Rationale for Evaluation and Treatment Rehabilitation  THERAPY DIAG:  Other low back pain  Cramp and spasm  Muscle weakness (generalized)  ONSET DATE: chronic since age 25  SUBJECTIVE:                                                                                                                                                                                            SUBJECTIVE STATEMENT: Heidi Lutz reports she continues to do well with back pain managed with PT.  She has not had any "screaming meemies" in weeks.  She still takes tramadol and uses lidocaine patches but more as preventative because has been afraid to stop.   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  PAIN:  Are you having pain? Yes: NPRS scale: 1/10 Pain location: low back   Pain description: constant pain also gets "screaming meemees" 25/10 Aggravating factors: pressure/weather changes, walking more than 10 min, prolonged standing >10 min  Relieving factors: sitting down   PRECAUTIONS: None  WEIGHT BEARING RESTRICTIONS No  FALLS:  Has patient fallen in last 6 months? No  LIVING ENVIRONMENT: Lives with: lives with their spouse Lives in: House/apartment Stairs:  3rd floor apartment, lives in [place] Has following equipment at home: Gilford Rile - 2 wheeled  OCCUPATION: retired  PLOF: Independent  PATIENT GOALS improve pain   OBJECTIVE:   DIAGNOSTIC FINDINGS:  04/24/2019 COMPARISON: December 2016. September 2018.    INDICATION: Wedge compression fracture of unspecified thoracic vertebra, initial encounter for closed fracture (#).   TECHNIQUE: MRI SPINE THORACIC WO IV CONTRAST-- Multiplanar, multisequence MR imaging of the thoracic spine performed.   FINDINGS:  Osseous structures: Chronic mild L1 vertebral body compression fracture. Chronic minimal T11 inferior vertebral body compression fracture.  Alignment: Alignment  maintained.  Spinal cord: No intrinsic spinal cord abnormality.  Paraspinal tissues: Unremarkable.  Contrast: None given.  Incidental: Trace right pleural effusion. Small seroma left breast.   DISC SPACES:  Incompletely characterized severe lower cervical spine DDD/facet arthrosis/uncovertebral joint spurring.  Moderate left posterior lateral quadrants disc protrusion at T11-T12. Tiny scattered thoracic spine disc bulges/protrusions mostly concentrated in the upper thoracic spine.  PATIENT SURVEYS:  Modified Oswestry 19/50= 38% disability   SCREENING FOR RED FLAGS: Bowel or bladder incontinence: No Spinal tumors: No Cauda equina syndrome: No Compression fracture: Yes: closed L1 compression fracture Abdominal aneurysm: No  COGNITION:  Overall cognitive status: Within functional limits for tasks assessed     SENSATION: No numbness or parasthesias  MUSCLE LENGTH: Hamstrings: Right 90 deg; Left 90 deg  POSTURE: rounded shoulders, forward head, decreased lumbar lordosis, and increased thoracic kyphosis, forward flexed posture   PALPATION: Tenderness throughout lumbar paraspinals, QL, glutes, piriformis bil.  Decreased mobility throughout lumbar spine, decreased lordotic curvature but no scoliosis.   LUMBAR ROM:   Active  AROM  eval  Flexion To knees  Extension To neutral *  Right lateral flexion To knees *  Left lateral flexion To knees *  Right rotation WNL  Left rotation WNL   (Blank rows = not tested)  *increased pain  LOWER EXTREMITY ROM:    good hip mobility bil for IR/ER  LOWER EXTREMITY MMT:    MMT Right* eval Left* eval  Hip flexion 4+ 4+  Hip extension 5 5  Hip abduction 5 5  Hip adduction 5 5  Knee flexion 5 5  Knee extension 5 5  Ankle dorsiflexion 5 5  Ankle plantarflexion 5 5   (Blank rows = not tested) *tested in sitting  LUMBAR SPECIAL TESTS:  Straight leg raise test: Negative and FABER test: Negative  FUNCTIONAL TESTS:  5 times sit to stand:  15 seconds  GAIT: Distance walked: 49' Assistive device utilized: None Level of assistance: Complete Independence Comments: forward flexed posture with gait.      TODAY'S TREATMENT  1/24/20242 Therapeutic Exercise: to improve strength and mobility.  Demo, verbal and tactile cues throughout for technique. Treadmill x 5 min at 2.2 mph incline  Manual Therapy: to decrease muscle spasm and pain and improve mobility STM/TPR to bil lumbar paraspinals, R UPA mobs, STM/TPR to bil glutes, piriformis, and QL; skilled palpation and monitoring during dry needling. Trigger Point Dry-Needling  Treatment instructions: Expect mild to moderate muscle soreness. S/S of pneumothorax if dry needled over a lung field, and to seek immediate medical attention should they occur. Patient verbalized understanding of these instructions and education.  Patient Consent Given: Yes Education handout provided: Yes Muscles treated: bil lumbar multifidi L2-L5,bil glut med, &  piriformis Electrical stimulation performed: No Parameters: N/A Treatment response/outcome: Twitch Response Elicited and Palpable Increase in Muscle Length Therapeutic Activity:  to assess progress towards goals.   Oswestry, review of goals.    03/02/2022   Manual Therapy: to decrease muscle spasm and pain and improve mobility STM/TPR to bil lumbar paraspinals, R UPA mobs, STM/TPR to bil glutes, piriformis, and QL; skilled palpation and monitoring during dry needling. Trigger Point Dry-Needling  Treatment instructions: Expect mild to moderate muscle soreness. S/S of pneumothorax if dry needled over a lung field, and to seek immediate medical attention should they occur. Patient verbalized understanding of these instructions and education.  Patient Consent Given: Yes Education handout provided: Yes Muscles treated: bil lumbar multifidi L2-L5,bil glut med, &  piriformis Electrical stimulation performed: No Parameters: N/A Treatment  response/outcome: Twitch Response Elicited and Palpable Increase in Muscle Length   02/09/2022  Manual Therapy: to decrease muscle spasm and pain and improve mobility STM/TPR to bil lumbar paraspinals, R UPA mobs, STM/TPR to  bil glutes, piriformis, and QL; skilled palpation and monitoring during dry needling. Trigger Point Dry-Needling  Treatment instructions: Expect mild to moderate muscle soreness. S/S of pneumothorax if dry needled over a lung field, and to seek immediate medical attention should they occur. Patient verbalized understanding of these instructions and education.  Patient Consent Given: Yes Education handout provided: Yes Muscles treated: bil lumbar multifidi L2-L5,bil glut med, &  piriformis Electrical stimulation performed: No Parameters: N/A Treatment response/outcome: Twitch Response Elicited and Palpable Increase in Muscle Length Modalities: MHP to low back after session while waiting for husband in waiting area.  (Not included in treatment time).   02/02/2022  Manual Therapy: to decrease muscle spasm and pain and improve mobility STM/TPR to bil lumbar paraspinals, R UPA mobs, STM/TPR to bil glutes, piriformis, and QL; skilled palpation and monitoring during dry needling. Trigger Point Dry-Needling  Treatment instructions: Expect mild to moderate muscle soreness. S/S of pneumothorax if dry needled over a lung field, and to seek immediate medical attention should they occur. Patient verbalized understanding of these instructions and education.  Patient Consent Given: Yes Education handout provided: Yes Muscles treated: bil lumbar multifidi L2-L5,bil glut med, &  piriformis Electrical stimulation performed: No Parameters: N/A Treatment response/outcome: Twitch Response Elicited and Palpable Increase in Muscle Length  Modalities: MHP to low back after session while waiting for husband in waiting area.  (Not included in treatment time).    PATIENT EDUCATION:   Education details: continue HEP as tolerated, Person educated: Patient and Child(ren) Education method: Customer service manager Education comprehension: verbalized understanding and returned demonstration   HOME EXERCISE PROGRAM: Access Code: [email or web address] URL: [email or web address] Date: 12/14/2021 Prepared by: Glenetta Hew  Exercises - Supine Lower Trunk Rotation  - 1 x daily - 7 x weekly - 2 sets - 10 reps - Supine Posterior Pelvic Tilt  - 1 x daily - 7 x weekly - 2 sets - 10 reps - Seated Posterior Pelvic Tilt  - 1 x daily - 7 x weekly - 2 sets - 10 reps  ASSESSMENT:  CLINICAL IMPRESSION: Heidi Lutz is making good progress with physical therapy.  She reports significant improvement in her low back pain after years and years of severe pain.  Her Modified Oswestry has improved to 28%, almost meeting LTG #4, and she is now able to stand for 15 minutes to cook, meeting LTG #5.  She has not yet been able to return to her exercise program, and she is still taking pain medication daily.  She would benefit from continued skilled therapy to continue to improve activity tolerance while managing her back pain and weaning off medication.  Recommend continuing 1x/week to every other week for additional 10 weeks.  Heidi Lutz would benefit from skilled physical therapy to decrease low back pain and improve activity tolerance.    OBJECTIVE IMPAIRMENTS decreased activity tolerance, difficulty walking, decreased strength, increased muscle spasms, improper body mechanics, postural dysfunction, and pain.   ACTIVITY LIMITATIONS lifting, standing, stairs, and locomotion level  PARTICIPATION LIMITATIONS: meal prep, cleaning, and community activity  PERSONAL FACTORS Time since onset of injury/illness/exacerbation and 3+ comorbidities: lumbar stenosis, RA, OA, Raynauds, history breast cancer, abdominal hysterectomy  are also affecting patient's functional outcome.    REHAB POTENTIAL: Good  CLINICAL DECISION MAKING: Evolving/moderate complexity  EVALUATION COMPLEXITY: Moderate   GOALS: Goals reviewed with patient? Yes  SHORT TERM GOALS: Target date: 12/08/2021   Patient will be independent with initial HEP.  Baseline: needs Goal status: MET 12/14/21-  given 12/17/21- good compliance but cues needed for seated pelvic tilts    LONG TERM GOALS: Target date: 05/25/2022    Patient will be independent with advanced/ongoing HEP to improve outcomes and carryover.  Baseline: needs progression  Goal status: MET 12/29/2021- completes 2x/day  2.  Patient will report 50% improvement in low back pain to improve QOL.  Baseline: 6-25/10 low back pain Goal status: MET  01/05/22- 75% improvement 03/16/22- 100% improvement.   3.  Patient will be able to stand/walk with upright posture without increased LBP.   Baseline: forward flexed posture due to pian Goal status: IN PROGRESS 01/05/22 - able to walk short distances now without pain.   03/16/22- improving, able to stand/walk for longer periods now before having to "perch" to relieve back pain.   4.   Patient will report 12% improvement on Oswestry to demonstrate improved functional ability.  Baseline: 38% disability Goal status: IN PROGRESS  01/05/22- 34% 03/16/22- 28%   5.  Patient will tolerate 15 min of standing to perform ADLs. Baseline: 10 minutes before needing to sit Goal status: MET 01/05/22 - still limited to ~ 10 min 03/16/22- reports can stand for 15 minutes now.   6.  Patient will tolerate 15 min walking on treadmill to improve activity tolerance and exercise.  Baseline: 10 min max Goal status: IN PROGRESS 01/05/22- 6 minutes on treadmill  03/16/22- can walk halls for about 15 minutes now, still about 6 min on treadmill.    PLAN: PT FREQUENCY: 1x/week  PT DURATION: 10 weeks to 05/25/2022.   PLANNED INTERVENTIONS: Therapeutic exercises, Therapeutic activity, Neuromuscular re-education, Balance  training, Gait training, Patient/Family education, Self Care, Joint mobilization, Dry Needling, Electrical stimulation, Spinal mobilization, Manual therapy, and Re-evaluation.  PLAN FOR NEXT SESSION: review core strengthening focusing on flexion/neutral and TrA activation, manual therapy/TrDN to lumbar multifidi/glutes/QL.  Continue to progress activity tolerance.    Rennie Natter, PT, DPT  03/16/2022, 5:35 PM

## 2022-03-17 NOTE — Addendum Note (Signed)
Addended by: Rennie Natter on: 03/17/2022 09:07 AM   Modules accepted: Orders

## 2022-03-23 ENCOUNTER — Encounter: Payer: Self-pay | Admitting: Physical Therapy

## 2022-03-23 ENCOUNTER — Ambulatory Visit: Payer: Medicare PPO | Admitting: Physical Therapy

## 2022-03-23 DIAGNOSIS — M5459 Other low back pain: Secondary | ICD-10-CM | POA: Diagnosis not present

## 2022-03-23 DIAGNOSIS — R252 Cramp and spasm: Secondary | ICD-10-CM

## 2022-03-23 DIAGNOSIS — M6281 Muscle weakness (generalized): Secondary | ICD-10-CM

## 2022-03-23 NOTE — Therapy (Signed)
OUTPATIENT PHYSICAL THERAPY TREATMENT     Patient Name: Heidi Lutz MRN: [id number] DOB:[birth year], 82 y.o., female Today's Date: 03/23/2022   PT End of Session - 03/23/22 1451     Visit Number 15    Number of Visits 24    Date for PT Re-Evaluation 05/25/22    Authorization Type [employer] M[employer]    Authorization Time Period 10 additional visits to 05/25/2022    Authorization - Visit Number 1    Authorization - Number of Visits 10    Progress Note Due on Visit 24    PT Start Time 9937    PT Stop Time 1696    PT Time Calculation (min) 43 min    Activity Tolerance Patient tolerated treatment well    Behavior During Therapy [facility] for tasks assessed/performed               Past Medical History:  Diagnosis Date   Cancer ([place]) 01/2017   left breast cancer, basal cell and 1 melanoma   Complication of anesthesia    heart rate spikeed in the PACU on her 2nd knee surgery   Environmental allergies    High cholesterol    OAB (overactive bladder) 02/2020   Osteoarthritis    Personal history of radiation therapy    PONV (postoperative nausea and vomiting)    with 1st knee surgery had n/v, none since   Raynaud disease    Raynauds disease-takes procardia   Rheumatoid arthritis ([place])    Past Surgical History:  Procedure Laterality Date   ABDOMINAL HYSTERECTOMY  1972   APPENDECTOMY  1953   BREAST BIOPSY Left 05/19/2020   benign   BREAST LUMPECTOMY Left 2019   BREAST LUMPECTOMY WITH RADIOACTIVE SEED AND SENTINEL LYMPH NODE BIOPSY Left 04/07/2017   Procedure: BREAST LUMPECTOMY WITH RADIOACTIVE SEED AND SENTINEL LYMPH NODE BIOPSY;  Surgeon: Erroll Luna, MD;  Location: [place];  Service: General;  Laterality: Left;   BUNIONECTOMY  1999   COLONOSCOPY     EYE SURGERY     bil cataract   GANGLION CYST EXCISION  1968   JOINT REPLACEMENT Left 2017    reverse shoulder replacement   REPLACEMENT TOTAL KNEE  2012, 2013   Right and Left   SHOULDER ARTHROSCOPY Left    [place]   Patient Active Problem List   Diagnosis Date Noted   Rheumatoid arthritis ([place])    Raynaud disease    PONV (postoperative nausea and vomiting)    Personal history of radiation therapy    Osteoarthritis    High cholesterol    Environmental allergies    Complication of anesthesia    OAB (overactive bladder) 02/2020   Age-related osteoporosis without current pathological fracture 10/31/2019   History of vertebral compression fracture 07/16/2019   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   Pleuritic chest pain 04/10/2018   GAD (generalized anxiety disorder) 04/25/2017   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   LBBB (left bundle branch block) 03/22/2017   Pre-operative cardiovascular examination 03/22/2017   Mixed hyperlipidemia 03/22/2017   Malignant neoplasm of central portion of left breast in female, estrogen receptor positive ([place]) 03/16/2017   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/2017   Osteopenia 03/03/2017   Cancer ([place]) 01/2017   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   Lumbar degenerative disc disease 02/27/2015   Lumbar spinal stenosis 02/27/2015   Osteoarthritis of spine with radiculopathy, lumbar region 02/27/2015   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   Closed compression fracture of L1 lumbar vertebra, with routine healing, subsequent encounter 11/26/2013   Insomnia 02/02/2010   Hyperlipidemia 04/22/2009   Essential hypertension 09/19/2008   Allergic rhinitis 07/31/2007   IBS (irritable bowel syndrome) 08/18/2006   Raynaud's disease 08/18/2006    PCP: [name], MD  REFERRING PROVIDER: [name], MD  REFERRING DIAG: [phone] (ICD-10-CM) - Lumbar stenosis  Rationale for Evaluation and Treatment Rehabilitation  THERAPY DIAG:  Other low back pain  Cramp and spasm  Muscle weakness (generalized)  ONSET DATE: chronic since age 48  SUBJECTIVE:                                                                                                                                                                                            SUBJECTIVE STATEMENT: [name] reports that with the weather and change in barometric pressure she had a "horrible weekend" even having some screaming meemies on Saturday.   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  PAIN:  Are you having pain? Yes: NPRS scale: 6/10 Pain location: low back   Pain description: constant pain also gets "screaming meemees" 25/10 Aggravating factors: pressure/weather changes, walking more than 10 min, prolonged standing >10 min  Relieving factors: sitting down   PRECAUTIONS: None  WEIGHT BEARING RESTRICTIONS No  FALLS:  Has patient fallen in last 6 months? No  LIVING ENVIRONMENT: Lives with: lives with their spouse Lives in: House/apartment Stairs:  3rd floor apartment, lives in [place] Has following equipment at home: Gilford Rile - 2 wheeled  OCCUPATION: retired  PLOF: Independent  PATIENT GOALS improve pain   OBJECTIVE:   DIAGNOSTIC FINDINGS:  04/24/2019 COMPARISON: December 2016. September 2018.    INDICATION: Wedge compression fracture of unspecified thoracic vertebra, initial encounter for closed fracture (#).   TECHNIQUE: MRI SPINE THORACIC WO IV CONTRAST-- Multiplanar, multisequence MR imaging of the thoracic spine performed.   FINDINGS:  Osseous structures: Chronic mild L1 vertebral body compression fracture. Chronic minimal T11 inferior vertebral body compression fracture.  Alignment: Alignment maintained.  Spinal cord: No intrinsic spinal cord abnormality.  Paraspinal tissues:  Unremarkable.  Contrast: None given.  Incidental: Trace right pleural effusion. Small seroma left breast.   DISC SPACES:  Incompletely characterized severe lower cervical spine DDD/facet arthrosis/uncovertebral joint spurring. Moderate left posterior lateral quadrants disc protrusion at T11-T12. Tiny  scattered thoracic spine disc bulges/protrusions mostly concentrated in the upper thoracic spine.  PATIENT SURVEYS:  Modified Oswestry 19/50= 38% disability   SCREENING FOR RED FLAGS: Bowel or bladder incontinence: No Spinal tumors: No Cauda equina syndrome: No Compression fracture: Yes: closed L1 compression fracture Abdominal aneurysm: No  COGNITION:  Overall cognitive status: Within functional limits for tasks assessed     SENSATION: No numbness or parasthesias  MUSCLE LENGTH: Hamstrings: Right 90 deg; Left 90 deg  POSTURE: rounded shoulders, forward head, decreased lumbar lordosis, and increased thoracic kyphosis, forward flexed posture   PALPATION: Tenderness throughout lumbar paraspinals, QL, glutes, piriformis bil.  Decreased mobility throughout lumbar spine, decreased lordotic curvature but no scoliosis.   LUMBAR ROM:   Active  AROM  eval  Flexion To knees  Extension To neutral *  Right lateral flexion To knees *  Left lateral flexion To knees *  Right rotation WNL  Left rotation WNL   (Blank rows = not tested)  *increased pain  LOWER EXTREMITY ROM:    good hip mobility bil for IR/ER  LOWER EXTREMITY MMT:    MMT Right* eval Left* eval  Hip flexion 4+ 4+  Hip extension 5 5  Hip abduction 5 5  Hip adduction 5 5  Knee flexion 5 5  Knee extension 5 5  Ankle dorsiflexion 5 5  Ankle plantarflexion 5 5   (Blank rows = not tested) *tested in sitting  LUMBAR SPECIAL TESTS:  Straight leg raise test: Negative and FABER test: Negative  FUNCTIONAL TESTS:  5 times sit to stand: 15 seconds  GAIT: Distance walked: 30' Assistive device utilized: None Level of  assistance: Complete Independence Comments: forward flexed posture with gait.      TODAY'S TREATMENT  03/23/2022  Therapeutic Exercise: to improve strength and mobility.  Demo, verbal and tactile cues throughout for technique. Treadmill x 5 min at 2 mph, 2.5 incline Review of HEP - LTR, PPT, Bridges, Straight leg raise, added PPT with marching today.  Manual Therapy: to decrease muscle spasm and pain and improve mobility STM/TPR to bil lumbar paraspinals, R UPA mobs, STM/TPR to bil glutes, piriformis, and QL; skilled palpation and monitoring during dry needling. Trigger Point Dry-Needling  Treatment instructions: Expect mild to moderate muscle soreness. S/S of pneumothorax if dry needled over a lung field, and to seek immediate medical attention should they occur. Patient verbalized understanding of these instructions and education.  Patient Consent Given: Yes Education handout provided: Yes Muscles treated: bil lumbar multifidi L2-L5,bil glut med, &  piriformis Electrical stimulation performed: No Parameters: N/A Treatment response/outcome: Twitch Response Elicited and Palpable Increase in Muscle Length  03/16/2022 Therapeutic Exercise: to improve strength and mobility.  Demo, verbal and tactile cues throughout for technique. Treadmill x 5 min at 2.2 mph incline  Manual Therapy: to decrease muscle spasm and pain and improve mobility STM/TPR to bil lumbar paraspinals, R UPA mobs, STM/TPR to bil glutes, piriformis, and QL; skilled palpation and monitoring during dry needling. Trigger Point Dry-Needling  Treatment instructions: Expect mild to moderate muscle soreness. S/S of pneumothorax if dry needled over a lung field, and to seek immediate medical attention should they occur. Patient verbalized understanding of these instructions and education.  Patient Consent Given: Yes Education handout provided: Yes Muscles treated: bil lumbar multifidi L2-L5,bil glut med, &  piriformis Electrical  stimulation performed: No Parameters: N/A Treatment response/outcome: Twitch Response Elicited and Palpable Increase in Muscle Length Therapeutic Activity:  to assess progress towards goals.   Oswestry,  review of goals.    03/02/2022   Manual Therapy: to decrease muscle spasm and pain and improve mobility STM/TPR to bil lumbar paraspinals, R UPA mobs, STM/TPR to bil glutes, piriformis, and QL; skilled palpation and monitoring during dry needling. Trigger Point Dry-Needling  Treatment instructions: Expect mild to moderate muscle soreness. S/S of pneumothorax if dry needled over a lung field, and to seek immediate medical attention should they occur. Patient verbalized understanding of these instructions and education.  Patient Consent Given: Yes Education handout provided: Yes Muscles treated: bil lumbar multifidi L2-L5,bil glut med, &  piriformis Electrical stimulation performed: No Parameters: N/A Treatment response/outcome: Twitch Response Elicited and Palpable Increase in Muscle Length     PATIENT EDUCATION:  Education details: continue HEP as tolerated, Person educated: Patient and Child(ren) Education method: Explanation and Demonstration Education comprehension: verbalized understanding and returned demonstration   HOME EXERCISE PROGRAM: Access Code: [email or web address] URL: [email or web address] Date: 03/23/2022 Prepared by: Glenetta Hew  Exercises - Supine Lower Trunk Rotation  - 1 x daily - 7 x weekly - 2 sets - 10 reps - Supine Posterior Pelvic Tilt  - 1 x daily - 7 x weekly - 2 sets - 10 reps - Seated Posterior Pelvic Tilt  - 1 x daily - 7 x weekly - 2 sets - 10 reps - Supine Bridge  - 1 x daily - 7 x weekly - 2 sets - 10 reps - Supine Single Leg Lift  - 1 x daily - 7 x weekly - 2 sets - 10 reps - Hooklying Clamshell with Resistance  - 1 x daily - 7 x weekly - 2 sets - 10 reps - Supine March  - 1 x daily - 7 x weekly - 3 sets - 10  reps  ASSESSMENT:  CLINICAL IMPRESSION: Heidi Lutz reports increased pain today.  We reviewed her HEP then focused on manual therapy to decrease pain, reported significant decrease in pain following interventions.  Derrek Monaco would benefit from skilled physical therapy to decrease low back pain and improve activity tolerance.    OBJECTIVE IMPAIRMENTS decreased activity tolerance, difficulty walking, decreased strength, increased muscle spasms, improper body mechanics, postural dysfunction, and pain.   ACTIVITY LIMITATIONS lifting, standing, stairs, and locomotion level  PARTICIPATION LIMITATIONS: meal prep, cleaning, and community activity  PERSONAL FACTORS Time since onset of injury/illness/exacerbation and 3+ comorbidities: lumbar stenosis, RA, OA, Raynauds, history breast cancer, abdominal hysterectomy  are also affecting patient's functional outcome.   REHAB POTENTIAL: Good  CLINICAL DECISION MAKING: Evolving/moderate complexity  EVALUATION COMPLEXITY: Moderate   GOALS: Goals reviewed with patient? Yes  SHORT TERM GOALS: Target date: 12/08/2021   Patient will be independent with initial HEP.  Baseline: needs Goal status: MET 12/14/21- given 12/17/21- good compliance but cues needed for seated pelvic tilts    LONG TERM GOALS: Target date: 05/25/2022    Patient will be independent with advanced/ongoing HEP to improve outcomes and carryover.  Baseline: needs progression  Goal status: MET 12/29/2021- completes 2x/day  2.  Patient will report 50% improvement in low back pain to improve QOL.  Baseline: 6-25/10 low back pain Goal status: MET  01/05/22- 75% improvement 03/16/22- 100% improvement.   3.  Patient will be able to stand/walk with upright posture without increased LBP.   Baseline: forward flexed posture due to pian Goal status: IN PROGRESS 01/05/22 - able to walk short distances now without pain.   03/16/22- improving, able to stand/walk for longer  periods now before having to "  perch" to relieve back pain.   4.   Patient will report 12% improvement on Oswestry to demonstrate improved functional ability.  Baseline: 38% disability Goal status: IN PROGRESS  01/05/22- 34% 03/16/22- 28%   5.  Patient will tolerate 15 min of standing to perform ADLs. Baseline: 10 minutes before needing to sit Goal status: MET 01/05/22 - still limited to ~ 10 min 03/16/22- reports can stand for 15 minutes now.   6.  Patient will tolerate 15 min walking on treadmill to improve activity tolerance and exercise.  Baseline: 10 min max Goal status: IN PROGRESS 01/05/22- 6 minutes on treadmill  03/16/22- can walk halls for about 15 minutes now, still about 6 min on treadmill.    PLAN: PT FREQUENCY: 1x/week  PT DURATION: 10 weeks to 05/25/2022.   PLANNED INTERVENTIONS: Therapeutic exercises, Therapeutic activity, Neuromuscular re-education, Balance training, Gait training, Patient/Family education, Self Care, Joint mobilization, Dry Needling, Electrical stimulation, Spinal mobilization, Manual therapy, and Re-evaluation.  PLAN FOR NEXT SESSION: review core strengthening focusing on flexion/neutral and TrA activation, manual therapy/TrDN to lumbar multifidi/glutes/QL.  Continue to progress activity tolerance.    Rennie Natter, PT, DPT  03/23/2022, 3:58 PM

## 2022-03-29 ENCOUNTER — Encounter (HOSPITAL_BASED_OUTPATIENT_CLINIC_OR_DEPARTMENT_OTHER): Payer: Self-pay

## 2022-03-29 ENCOUNTER — Ambulatory Visit (HOSPITAL_BASED_OUTPATIENT_CLINIC_OR_DEPARTMENT_OTHER)
Admission: RE | Admit: 2022-03-29 | Discharge: 2022-03-29 | Disposition: A | Discharge status: Home / Self Care | Payer: Medicare PPO | Source: Ambulatory Visit | Attending: Family Medicine | Admitting: Family Medicine

## 2022-03-29 DIAGNOSIS — R053 Chronic cough: Secondary | ICD-10-CM

## 2022-03-30 ENCOUNTER — Ambulatory Visit: Payer: Medicare PPO | Attending: Family Medicine | Admitting: Physical Therapy

## 2022-03-30 ENCOUNTER — Encounter: Payer: Self-pay | Admitting: Physical Therapy

## 2022-03-30 DIAGNOSIS — R252 Cramp and spasm: Secondary | ICD-10-CM | POA: Diagnosis present

## 2022-03-30 DIAGNOSIS — M5459 Other low back pain: Secondary | ICD-10-CM | POA: Diagnosis present

## 2022-03-30 DIAGNOSIS — M6281 Muscle weakness (generalized): Secondary | ICD-10-CM | POA: Diagnosis present

## 2022-03-30 NOTE — Therapy (Signed)
OUTPATIENT PHYSICAL THERAPY TREATMENT     Patient Name: Heidi Lutz MRN: [id number] DOB:Jun 06, 1940, 82 y.o., female Today's Date: 03/30/2022   PT End of Session - 03/30/22 1444     Visit Number 16    Number of Visits 24    Date for PT Re-Evaluation 05/25/22    Authorization Type [employer] M[employer]    Authorization Time Period 10 additional visits to 05/25/2022    Authorization - Number of Visits 10    Progress Note Due on Visit 24    PT Start Time 1401    PT Stop Time 1440    PT Time Calculation (min) 39 min    Activity Tolerance Patient tolerated treatment well    Behavior During Therapy [facility] for tasks assessed/performed               Past Medical History:  Diagnosis Date   Cancer ([place]) 01/2017   left breast cancer, basal cell and 1 melanoma   Complication of anesthesia    heart rate spikeed in the PACU on her 2nd knee surgery   Environmental allergies    High cholesterol    OAB (overactive bladder) 02/2020   Osteoarthritis    Personal history of radiation therapy    PONV (postoperative nausea and vomiting)    with 1st knee surgery had n/v, none since   Raynaud disease    Raynauds disease-takes procardia   Rheumatoid arthritis ([place])    Past Surgical History:  Procedure Laterality Date   ABDOMINAL HYSTERECTOMY  1972   APPENDECTOMY  1953   BREAST BIOPSY Left 05/19/2020   benign   BREAST LUMPECTOMY Left 2019   BREAST LUMPECTOMY WITH RADIOACTIVE SEED AND SENTINEL LYMPH NODE BIOPSY Left 04/07/2017   Procedure: BREAST LUMPECTOMY WITH RADIOACTIVE SEED AND SENTINEL LYMPH NODE BIOPSY;  Surgeon: Erroll Luna, MD;  Location: [place];  Service: General;  Laterality: Left;   BUNIONECTOMY  1999   COLONOSCOPY     EYE SURGERY     bil cataract   GANGLION CYST EXCISION  1968   JOINT REPLACEMENT Left 2017    reverse shoulder replacement   REPLACEMENT TOTAL KNEE  2012, 2013   Right and Left   SHOULDER ARTHROSCOPY Left    [place]    Patient Active Problem List   Diagnosis Date Noted   Rheumatoid arthritis ([place])    Raynaud disease    PONV (postoperative nausea and vomiting)    Personal history of radiation therapy    Osteoarthritis    High cholesterol    Environmental allergies    Complication of anesthesia    OAB (overactive bladder) 02/2020   Age-related osteoporosis without current pathological fracture 10/31/2019   History of vertebral compression fracture 07/16/2019   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   Pleuritic chest pain 04/10/2018   GAD (generalized anxiety disorder) 04/25/2017   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   LBBB (left bundle branch block) 03/22/2017   Pre-operative cardiovascular examination 03/22/2017   Mixed hyperlipidemia 03/22/2017   Malignant neoplasm of central portion of left breast in female, estrogen receptor positive ([place]) 03/16/2017   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/2017   Osteopenia 03/03/2017   Cancer ([place]) 01/2017   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   Lumbar degenerative disc disease 02/27/2015   Lumbar spinal stenosis 02/27/2015   Osteoarthritis of spine with radiculopathy, lumbar region 02/27/2015   Basal cell carcinoma 12/15/2014   Closed compression fracture of  L1 lumbar vertebra, with routine healing, subsequent encounter 11/26/2013   Insomnia 02/02/2010   Hyperlipidemia 04/22/2009   Essential hypertension 09/19/2008   Allergic rhinitis 07/31/2007   IBS (irritable bowel syndrome) 08/18/2006   Raynaud's disease 08/18/2006    PCP: [name], MD  REFERRING PROVIDER: [name], MD  REFERRING DIAG: [phone] (ICD-10-CM) - Lumbar stenosis  Rationale for Evaluation and Treatment Rehabilitation  THERAPY DIAG:  Other low back pain  Cramp and spasm  Muscle weakness (generalized)  ONSET DATE: chronic since age 72  SUBJECTIVE:                                                                                                                                                                                            SUBJECTIVE STATEMENT: Heidi Lutz reports she had a much better week averaging 4-6, compared to 7-8 previous week, today feels good.  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  PAIN:  Are you having pain? Yes: NPRS scale: 3/10 Pain location: low back   Pain description: constant pain also gets "screaming meemees" 25/10 Aggravating factors: pressure/weather changes, walking more than 10 min, prolonged standing >10 min  Relieving factors: sitting down   PRECAUTIONS: None  WEIGHT BEARING RESTRICTIONS No  FALLS:  Has patient fallen in last 6 months? No  LIVING ENVIRONMENT: Lives with: lives with their spouse Lives in: House/apartment Stairs:  3rd floor apartment, lives in [place] Has following equipment at home: Gilford Rile - 2 wheeled  OCCUPATION: retired  PLOF: Independent  PATIENT GOALS improve pain   OBJECTIVE:   DIAGNOSTIC FINDINGS:  04/24/2019 COMPARISON: December 2016. September 2018.    INDICATION: Wedge compression fracture of unspecified thoracic vertebra, initial encounter for closed fracture (#).   TECHNIQUE: MRI SPINE THORACIC WO IV CONTRAST-- Multiplanar, multisequence MR imaging of the thoracic spine performed.   FINDINGS:  Osseous structures: Chronic mild L1 vertebral body compression fracture. Chronic minimal T11 inferior vertebral body compression fracture.  Alignment: Alignment maintained.  Spinal cord: No  intrinsic spinal cord abnormality.  Paraspinal tissues: Unremarkable.  Contrast: None given.  Incidental: Trace right pleural effusion. Small seroma left breast.   DISC SPACES:  Incompletely characterized severe lower cervical spine DDD/facet arthrosis/uncovertebral joint spurring. Moderate left posterior  lateral quadrants disc protrusion at T11-T12. Tiny scattered thoracic spine disc bulges/protrusions mostly concentrated in the upper thoracic spine.  PATIENT SURVEYS:  Modified Oswestry 19/50= 38% disability   SCREENING FOR RED FLAGS: Bowel or bladder incontinence: No Spinal tumors: No Cauda equina syndrome: No Compression fracture: Yes: closed L1 compression fracture Abdominal aneurysm: No  COGNITION:  Overall cognitive status: Within functional limits for tasks assessed     SENSATION: No numbness or parasthesias  MUSCLE LENGTH: Hamstrings: Right 90 deg; Left 90 deg  POSTURE: rounded shoulders, forward head, decreased lumbar lordosis, and increased thoracic kyphosis, forward flexed posture   PALPATION: Tenderness throughout lumbar paraspinals, QL, glutes, piriformis bil.  Decreased mobility throughout lumbar spine, decreased lordotic curvature but no scoliosis.   LUMBAR ROM:   Active  AROM  eval  Flexion To knees  Extension To neutral *  Right lateral flexion To knees *  Left lateral flexion To knees *  Right rotation WNL  Left rotation WNL   (Blank rows = not tested)  *increased pain  LOWER EXTREMITY ROM:    good hip mobility bil for IR/ER  LOWER EXTREMITY MMT:    MMT Right* eval Left* eval  Hip flexion 4+ 4+  Hip extension 5 5  Hip abduction 5 5  Hip adduction 5 5  Knee flexion 5 5  Knee extension 5 5  Ankle dorsiflexion 5 5  Ankle plantarflexion 5 5   (Blank rows = not tested) *tested in sitting  LUMBAR SPECIAL TESTS:  Straight leg raise test: Negative and FABER test: Negative  FUNCTIONAL TESTS:  5 times sit to stand: 15 seconds  GAIT: Distance  walked: 60' Assistive device utilized: None Level of assistance: Complete Independence Comments: forward flexed posture with gait.      TODAY'S TREATMENT  03/30/2022  Therapeutic Exercise: to improve strength and mobility.  Demo, verbal and tactile cues throughout for technique. Treadmill x 5 min at 2 mph, 3.0 incline Manual Therapy: to decrease muscle spasm and pain and improve mobility STM/TPR to bil lumbar paraspinals, R UPA mobs, STM/TPR to bil glutes, piriformis, and QL; skilled palpation and monitoring during dry needling. Trigger Point Dry-Needling  Treatment instructions: Expect mild to moderate muscle soreness. S/S of pneumothorax if dry needled over a lung field, and to seek immediate medical attention should they occur. Patient verbalized understanding of these instructions and education.  Patient Consent Given: Yes Education handout provided: Yes Muscles treated: bil lumbar multifidi L2-L5,bil glut med, &  piriformis Electrical stimulation performed: No Parameters: N/A Treatment response/outcome: Twitch Response Elicited and Palpable Increase in Muscle Length  03/23/2022  Therapeutic Exercise: to improve strength and mobility.  Demo, verbal and tactile cues throughout for technique. Treadmill x 5 min at 2 mph, 2.5 incline Review of HEP - LTR, PPT, Bridges, Straight leg raise, added PPT with marching today.  Manual Therapy: to decrease muscle spasm and pain and improve mobility STM/TPR to bil lumbar paraspinals, R UPA mobs, STM/TPR to bil glutes, piriformis, and QL; skilled palpation and monitoring during dry needling. Trigger Point Dry-Needling  Treatment instructions: Expect mild to moderate muscle soreness. S/S of pneumothorax if dry needled over a lung field, and to seek immediate medical attention should they occur. Patient verbalized understanding of these instructions and education.  Patient Consent Given: Yes Education handout provided: Yes Muscles treated: bil lumbar  multifidi L2-L5,bil glut med, &  piriformis Electrical stimulation performed: No Parameters: N/A Treatment response/outcome: Twitch Response Elicited and Palpable Increase in Muscle Length  03/16/2022 Therapeutic  Exercise: to improve strength and mobility.  Demo, verbal and tactile cues throughout for technique. Treadmill x 5 min at 2.2 mph incline  Manual Therapy: to decrease muscle spasm and pain and improve mobility STM/TPR to bil lumbar paraspinals, R UPA mobs, STM/TPR to bil glutes, piriformis, and QL; skilled palpation and monitoring during dry needling. Trigger Point Dry-Needling  Treatment instructions: Expect mild to moderate muscle soreness. S/S of pneumothorax if dry needled over a lung field, and to seek immediate medical attention should they occur. Patient verbalized understanding of these instructions and education.  Patient Consent Given: Yes Education handout provided: Yes Muscles treated: bil lumbar multifidi L2-L5,bil glut med, &  piriformis Electrical stimulation performed: No Parameters: N/A Treatment response/outcome: Twitch Response Elicited and Palpable Increase in Muscle Length Therapeutic Activity:  to assess progress towards goals.   Oswestry, review of goals.      PATIENT EDUCATION:  Education details: continue HEP as tolerated, Person educated: Patient and Child(ren) Education method: Customer service manager Education comprehension: verbalized understanding and returned demonstration   HOME EXERCISE PROGRAM: Access Code: [email or web address] URL: [email or web address] Date: 03/23/2022 Prepared by: Glenetta Hew  Exercises - Supine Lower Trunk Rotation  - 1 x daily - 7 x weekly - 2 sets - 10 reps - Supine Posterior Pelvic Tilt  - 1 x daily - 7 x weekly - 2 sets - 10 reps - Seated Posterior Pelvic Tilt  - 1 x daily - 7 x weekly - 2 sets - 10 reps - Supine Bridge  - 1 x daily - 7 x weekly - 2 sets - 10 reps - Supine Single Leg Lift  - 1 x  daily - 7 x weekly - 2 sets - 10 reps - Hooklying Clamshell with Resistance  - 1 x daily - 7 x weekly - 2 sets - 10 reps - Supine March  - 1 x daily - 7 x weekly - 3 sets - 10 reps  ASSESSMENT:  CLINICAL IMPRESSION: Heidi Lutz reports significant improvement in pain compared to last week but still having difficulty with standing prolonged periods backing.  Reported decreased pain following interventions today.  Encouraged to consider exercising again at [place] facilities as has not been attending over winter. MHP applied to low back in waiting area after session while husband attended PT.    Heidi Lutz would benefit from skilled physical therapy to decrease low back pain and improve activity tolerance.    OBJECTIVE IMPAIRMENTS decreased activity tolerance, difficulty walking, decreased strength, increased muscle spasms, improper body mechanics, postural dysfunction, and pain.   ACTIVITY LIMITATIONS lifting, standing, stairs, and locomotion level  PARTICIPATION LIMITATIONS: meal prep, cleaning, and community activity  PERSONAL FACTORS Time since onset of injury/illness/exacerbation and 3+ comorbidities: lumbar stenosis, RA, OA, Raynauds, history breast cancer, abdominal hysterectomy  are also affecting patient's functional outcome.   REHAB POTENTIAL: Good  CLINICAL DECISION MAKING: Evolving/moderate complexity  EVALUATION COMPLEXITY: Moderate   GOALS: Goals reviewed with patient? Yes  SHORT TERM GOALS: Target date: 12/08/2021   Patient will be independent with initial HEP.  Baseline: needs Goal status: MET 12/14/21- given 12/17/21- good compliance but cues needed for seated pelvic tilts    LONG TERM GOALS: Target date: 05/25/2022    Patient will be independent with advanced/ongoing HEP to improve outcomes and carryover.  Baseline: needs progression  Goal status: MET 12/29/2021- completes 2x/day  2.  Patient will report 50% improvement in low back pain to  improve QOL.  Baseline: 6-25/10 low back pain  Goal status: MET  01/05/22- 75% improvement 03/16/22- 100% improvement.   3.  Patient will be able to stand/walk with upright posture without increased LBP.   Baseline: forward flexed posture due to pian Goal status: IN PROGRESS 01/05/22 - able to walk short distances now without pain.   03/16/22- improving, able to stand/walk for longer periods now before having to "perch" to relieve back pain.   4.   Patient will report 12% improvement on Oswestry to demonstrate improved functional ability.  Baseline: 38% disability Goal status: IN PROGRESS  01/05/22- 34% 03/16/22- 28%   5.  Patient will tolerate 15 min of standing to perform ADLs. Baseline: 10 minutes before needing to sit Goal status: MET 01/05/22 - still limited to ~ 10 min 03/16/22- reports can stand for 15 minutes now.   6.  Patient will tolerate 15 min walking on treadmill to improve activity tolerance and exercise.  Baseline: 10 min max Goal status: IN PROGRESS 01/05/22- 6 minutes on treadmill  03/16/22- can walk halls for about 15 minutes now, still about 6 min on treadmill.    PLAN: PT FREQUENCY: 1x/week  PT DURATION: 10 weeks to 05/25/2022.   PLANNED INTERVENTIONS: Therapeutic exercises, Therapeutic activity, Neuromuscular re-education, Balance training, Gait training, Patient/Family education, Self Care, Joint mobilization, Dry Needling, Electrical stimulation, Spinal mobilization, Manual therapy, and Re-evaluation.  PLAN FOR NEXT SESSION: review core strengthening focusing on flexion/neutral and TrA activation, manual therapy/TrDN to lumbar multifidi/glutes/QL.  Continue to progress activity tolerance.    Rennie Natter, PT, DPT  03/30/2022, 4:40 PM

## 2022-04-04 ENCOUNTER — Telehealth (HOSPITAL_BASED_OUTPATIENT_CLINIC_OR_DEPARTMENT_OTHER): Payer: Self-pay

## 2022-04-06 ENCOUNTER — Ambulatory Visit: Payer: Medicare PPO | Admitting: Physical Therapy

## 2022-04-06 ENCOUNTER — Encounter: Payer: Self-pay | Admitting: Physical Therapy

## 2022-04-06 DIAGNOSIS — M6281 Muscle weakness (generalized): Secondary | ICD-10-CM

## 2022-04-06 DIAGNOSIS — R252 Cramp and spasm: Secondary | ICD-10-CM

## 2022-04-06 DIAGNOSIS — M5459 Other low back pain: Secondary | ICD-10-CM | POA: Diagnosis not present

## 2022-04-06 NOTE — Therapy (Signed)
OUTPATIENT PHYSICAL THERAPY TREATMENT     Patient Name: Heidi Lutz MRN: [id number] DOB:04-Oct-1940, 82 y.o., female Today's Date: 04/06/2022   PT End of Session - 04/06/22 1443     Visit Number 17    Number of Visits 24    Date for PT Re-Evaluation 05/25/22    Authorization Type [employer] M[employer]    Authorization Time Period 10 additional visits to 05/25/2022    Authorization - Visit Number 2    Authorization - Number of Visits 10    Progress Note Due on Visit 24    PT Start Time [id number]    PT Stop Time [id number]    PT Time Calculation (min) 38 min    Activity Tolerance Patient tolerated treatment well    Behavior During Therapy [facility] for tasks assessed/performed               Past Medical History:  Diagnosis Date   Cancer ([place]) 01/2017   left breast cancer, basal cell and 1 melanoma   Complication of anesthesia    heart rate spikeed in the PACU on her 2nd knee surgery   Environmental allergies    High cholesterol    OAB (overactive bladder) 02/2020   Osteoarthritis    Personal history of radiation therapy    PONV (postoperative nausea and vomiting)    with 1st knee surgery had n/v, none since   Raynaud disease    Raynauds disease-takes procardia   Rheumatoid arthritis ([place])    Past Surgical History:  Procedure Laterality Date   ABDOMINAL HYSTERECTOMY  1972   APPENDECTOMY  1953   BREAST BIOPSY Left 05/19/2020   benign   BREAST LUMPECTOMY Left 2019   BREAST LUMPECTOMY WITH RADIOACTIVE SEED AND SENTINEL LYMPH NODE BIOPSY Left 04/07/2017   Procedure: BREAST LUMPECTOMY WITH RADIOACTIVE SEED AND SENTINEL LYMPH NODE BIOPSY;  Surgeon: Erroll Luna, MD;  Location: [place];  Service: General;  Laterality: Left;   BUNIONECTOMY  1999   COLONOSCOPY     EYE SURGERY     bil cataract   GANGLION CYST EXCISION  1968   JOINT REPLACEMENT Left 2017    reverse shoulder replacement   REPLACEMENT TOTAL KNEE  2012, 2013   Right and Left   SHOULDER ARTHROSCOPY Left    [place]   Patient Active Problem List   Diagnosis Date Noted   Rheumatoid arthritis ([place])    Raynaud disease    PONV (postoperative nausea and vomiting)    Personal history of radiation therapy    Osteoarthritis    High cholesterol    Environmental allergies    Complication of anesthesia    OAB (overactive bladder) 02/2020   Age-related osteoporosis without current pathological fracture 10/31/2019   History of vertebral compression fracture 07/16/2019   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   Pleuritic chest pain 04/10/2018   GAD (generalized anxiety disorder) 04/25/2017   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   LBBB (left bundle branch block) 03/22/2017   Pre-operative cardiovascular examination 03/22/2017   Mixed hyperlipidemia 03/22/2017   Malignant neoplasm of central portion of left breast in female, estrogen receptor positive ([place]) 03/16/2017   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/2017   Osteopenia 03/03/2017   Cancer ([place]) 01/2017   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   Lumbar degenerative disc disease 02/27/2015   Lumbar spinal stenosis 02/27/2015   Osteoarthritis of spine with radiculopathy, lumbar region 02/27/2015   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   Closed compression fracture of L1 lumbar vertebra, with routine healing, subsequent encounter 11/26/2013   Insomnia 02/02/2010   Hyperlipidemia 04/22/2009   Essential hypertension 09/19/2008   Allergic rhinitis 07/31/2007   IBS (irritable bowel syndrome) 08/18/2006   Raynaud's disease 08/18/2006    PCP: [name], MD  REFERRING PROVIDER: [name], MD  REFERRING DIAG: [phone] (ICD-10-CM) - Lumbar stenosis  Rationale for Evaluation and Treatment Rehabilitation  THERAPY DIAG:  Other low back pain  Cramp and spasm  Muscle weakness (generalized)  ONSET DATE: chronic since age 69  SUBJECTIVE:                                                                                                                                                                                            SUBJECTIVE STATEMENT: Heidi Lutz reports she had a good week, only 1-2 (now that she is finished baking all of her streusels).   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  PAIN:  Are you having pain? Yes: NPRS scale: 1-2/10 Pain location: low back   Pain description: constant pain also gets "screaming meemees" 25/10 Aggravating factors: pressure/weather changes, walking more than 10 min, prolonged standing >10 min  Relieving factors: sitting down   PRECAUTIONS: None  WEIGHT BEARING RESTRICTIONS No  FALLS:  Has patient fallen in last 6 months? No  LIVING ENVIRONMENT: Lives with: lives with their spouse Lives in: House/apartment Stairs:  3rd floor apartment, lives in [place] Has following equipment at home: Gilford Rile - 2 wheeled  OCCUPATION: retired  PLOF: Independent  PATIENT GOALS improve pain   OBJECTIVE:   DIAGNOSTIC FINDINGS:  04/24/2019 COMPARISON: December 2016. September 2018.    INDICATION: Wedge compression fracture of unspecified thoracic vertebra, initial encounter for closed fracture (#).   TECHNIQUE: MRI SPINE THORACIC WO IV CONTRAST-- Multiplanar, multisequence MR imaging of the thoracic spine performed.   FINDINGS:  Osseous structures: Chronic mild L1 vertebral body compression fracture. Chronic minimal T11 inferior vertebral body compression fracture.  Alignment: Alignment maintained.  Spinal cord: No intrinsic spinal cord abnormality.  Paraspinal tissues: Unremarkable.  Contrast: None given.   Incidental: Trace right pleural effusion. Small seroma left breast.   DISC SPACES:  Incompletely characterized severe lower cervical spine DDD/facet arthrosis/uncovertebral joint spurring. Moderate left posterior lateral quadrants disc protrusion at T11-T12. Tiny scattered thoracic spine disc  bulges/protrusions mostly concentrated in the upper thoracic spine.  PATIENT SURVEYS:  Modified Oswestry 19/50= 38% disability   SCREENING FOR RED FLAGS: Bowel or bladder incontinence: No Spinal tumors: No Cauda equina syndrome: No Compression fracture: Yes: closed L1 compression fracture Abdominal aneurysm: No  COGNITION:  Overall cognitive status: Within functional limits for tasks assessed     SENSATION: No numbness or parasthesias  MUSCLE LENGTH: Hamstrings: Right 90 deg; Left 90 deg  POSTURE: rounded shoulders, forward head, decreased lumbar lordosis, and increased thoracic kyphosis, forward flexed posture   PALPATION: Tenderness throughout lumbar paraspinals, QL, glutes, piriformis bil.  Decreased mobility throughout lumbar spine, decreased lordotic curvature but no scoliosis.   LUMBAR ROM:   Active  AROM  eval  Flexion To knees  Extension To neutral *  Right lateral flexion To knees *  Left lateral flexion To knees *  Right rotation WNL  Left rotation WNL   (Blank rows = not tested)  *increased pain  LOWER EXTREMITY ROM:    good hip mobility bil for IR/ER  LOWER EXTREMITY MMT:    MMT Right* eval Left* eval  Hip flexion 4+ 4+  Hip extension 5 5  Hip abduction 5 5  Hip adduction 5 5  Knee flexion 5 5  Knee extension 5 5  Ankle dorsiflexion 5 5  Ankle plantarflexion 5 5   (Blank rows = not tested) *tested in sitting  LUMBAR SPECIAL TESTS:  Straight leg raise test: Negative and FABER test: Negative  FUNCTIONAL TESTS:  5 times sit to stand: 15 seconds  GAIT: Distance walked: 42' Assistive device utilized: None Level of assistance: Complete  Independence Comments: forward flexed posture with gait.      TODAY'S TREATMENT  04/06/2022  Therapeutic Exercise: to improve strength and mobility.  Demo, verbal and tactile cues throughout for technique. Treadmill x 5 min at 2 mph, 3.0 incline Manual Therapy: to decrease muscle spasm and pain and improve mobility STM/TPR to bil lumbar paraspinals, R UPA mobs, STM/TPR to bil glutes, piriformis, and QL; skilled palpation and monitoring during dry needling. Trigger Point Dry-Needling  Treatment instructions: Expect mild to moderate muscle soreness. S/S of pneumothorax if dry needled over a lung field, and to seek immediate medical attention should they occur. Patient verbalized understanding of these instructions and education.  Patient Consent Given: Yes Education handout provided: Yes Muscles treated: bil lumbar multifidi L2-L5,bil glut med, &  piriformis Electrical stimulation performed: No Parameters: N/A Treatment response/outcome: Twitch Response Elicited and Palpable Increase in Muscle Length  03/30/2022  Therapeutic Exercise: to improve strength and mobility.  Demo, verbal and tactile cues throughout for technique. Treadmill x 5 min at 2 mph, 3.0 incline Manual Therapy: to decrease muscle spasm and pain and improve mobility STM/TPR to bil lumbar paraspinals, R UPA mobs, STM/TPR to bil glutes, piriformis, and QL; skilled palpation and monitoring during dry needling. Trigger Point Dry-Needling  Treatment instructions: Expect mild to moderate muscle soreness. S/S of pneumothorax if dry needled over a lung field, and to seek immediate medical attention should they occur. Patient verbalized understanding of these instructions and education.  Patient Consent Given: Yes Education handout provided: Yes Muscles treated: bil lumbar multifidi L2-L5,bil glut med, &  piriformis Electrical stimulation performed: No Parameters: N/A Treatment response/outcome: Twitch Response Elicited and  Palpable Increase in Muscle Length  03/23/2022  Therapeutic Exercise: to improve strength and mobility.  Demo, verbal and tactile cues throughout for technique. Treadmill x 5 min at 2 mph, 2.5 incline Review of  HEP - LTR, PPT, Bridges, Straight leg raise, added PPT with marching today.  Manual Therapy: to decrease muscle spasm and pain and improve mobility STM/TPR to bil lumbar paraspinals, R UPA mobs, STM/TPR to bil glutes, piriformis, and QL; skilled palpation and monitoring during dry needling. Trigger Point Dry-Needling  Treatment instructions: Expect mild to moderate muscle soreness. S/S of pneumothorax if dry needled over a lung field, and to seek immediate medical attention should they occur. Patient verbalized understanding of these instructions and education.  Patient Consent Given: Yes Education handout provided: Yes Muscles treated: bil lumbar multifidi L2-L5,bil glut med, &  piriformis Electrical stimulation performed: No Parameters: N/A Treatment response/outcome: Twitch Response Elicited and Palpable Increase in Muscle Length  03/16/2022 Therapeutic Exercise: to improve strength and mobility.  Demo, verbal and tactile cues throughout for technique. Treadmill x 5 min at 2.2 mph incline  Manual Therapy: to decrease muscle spasm and pain and improve mobility STM/TPR to bil lumbar paraspinals, R UPA mobs, STM/TPR to bil glutes, piriformis, and QL; skilled palpation and monitoring during dry needling. Trigger Point Dry-Needling  Treatment instructions: Expect mild to moderate muscle soreness. S/S of pneumothorax if dry needled over a lung field, and to seek immediate medical attention should they occur. Patient verbalized understanding of these instructions and education.  Patient Consent Given: Yes Education handout provided: Yes Muscles treated: bil lumbar multifidi L2-L5,bil glut med, &  piriformis Electrical stimulation performed: No Parameters: N/A Treatment response/outcome:  Twitch Response Elicited and Palpable Increase in Muscle Length Therapeutic Activity:  to assess progress towards goals.   Oswestry, review of goals.      PATIENT EDUCATION:  Education details: continue HEP as tolerated, Person educated: Patient and Child(ren) Education method: Customer service manager Education comprehension: verbalized understanding and returned demonstration   HOME EXERCISE PROGRAM: Access Code: [email or web address] URL: [email or web address] Date: 03/23/2022 Prepared by: Glenetta Hew  Exercises - Supine Lower Trunk Rotation  - 1 x daily - 7 x weekly - 2 sets - 10 reps - Supine Posterior Pelvic Tilt  - 1 x daily - 7 x weekly - 2 sets - 10 reps - Seated Posterior Pelvic Tilt  - 1 x daily - 7 x weekly - 2 sets - 10 reps - Supine Bridge  - 1 x daily - 7 x weekly - 2 sets - 10 reps - Supine Single Leg Lift  - 1 x daily - 7 x weekly - 2 sets - 10 reps - Hooklying Clamshell with Resistance  - 1 x daily - 7 x weekly - 2 sets - 10 reps - Supine March  - 1 x daily - 7 x weekly - 3 sets - 10 reps  ASSESSMENT:  CLINICAL IMPRESSION: Heidi Lutz continues to report improved pain this week compared to previous 2 weeks.  Continues to report good compliance with HEP and tolerating exercises well.  Responded to manual therapy well, applied MHP to back after session after session while waited on husband.   Heidi Lutz would benefit from skilled physical therapy to decrease low back pain and improve activity tolerance.    OBJECTIVE IMPAIRMENTS decreased activity tolerance, difficulty walking, decreased strength, increased muscle spasms, improper body mechanics, postural dysfunction, and pain.   ACTIVITY LIMITATIONS lifting, standing, stairs, and locomotion level  PARTICIPATION LIMITATIONS: meal prep, cleaning, and community activity  PERSONAL FACTORS Time since onset of injury/illness/exacerbation and 3+ comorbidities: lumbar stenosis, RA, OA,  Raynauds, history breast cancer, abdominal hysterectomy  are also affecting patient's functional outcome.  REHAB POTENTIAL: Good  CLINICAL DECISION MAKING: Evolving/moderate complexity  EVALUATION COMPLEXITY: Moderate   GOALS: Goals reviewed with patient? Yes  SHORT TERM GOALS: Target date: 12/08/2021   Patient will be independent with initial HEP.  Baseline: needs Goal status: MET 12/14/21- given 12/17/21- good compliance but cues needed for seated pelvic tilts    LONG TERM GOALS: Target date: 05/25/2022    Patient will be independent with advanced/ongoing HEP to improve outcomes and carryover.  Baseline: needs progression  Goal status: MET 12/29/2021- completes 2x/day  2.  Patient will report 50% improvement in low back pain to improve QOL.  Baseline: 6-25/10 low back pain Goal status: MET  01/05/22- 75% improvement 03/16/22- 100% improvement.   3.  Patient will be able to stand/walk with upright posture without increased LBP.   Baseline: forward flexed posture due to pian Goal status: IN PROGRESS 01/05/22 - able to walk short distances now without pain.   03/16/22- improving, able to stand/walk for longer periods now before having to "perch" to relieve back pain.   4.   Patient will report 12% improvement on Oswestry to demonstrate improved functional ability.  Baseline: 38% disability Goal status: IN PROGRESS  01/05/22- 34% 03/16/22- 28%   5.  Patient will tolerate 15 min of standing to perform ADLs. Baseline: 10 minutes before needing to sit Goal status: MET 01/05/22 - still limited to ~ 10 min 03/16/22- reports can stand for 15 minutes now.   6.  Patient will tolerate 15 min walking on treadmill to improve activity tolerance and exercise.  Baseline: 10 min max Goal status: IN PROGRESS 01/05/22- 6 minutes on treadmill  03/16/22- can walk halls for about 15 minutes now, still about 6 min on treadmill.    PLAN: PT FREQUENCY: 1x/week  PT DURATION: 10 weeks to 05/25/2022.    PLANNED INTERVENTIONS: Therapeutic exercises, Therapeutic activity, Neuromuscular re-education, Balance training, Gait training, Patient/Family education, Self Care, Joint mobilization, Dry Needling, Electrical stimulation, Spinal mobilization, Manual therapy, and Re-evaluation.  PLAN FOR NEXT SESSION: review core strengthening focusing on flexion/neutral and TrA activation, manual therapy/TrDN to lumbar multifidi/glutes/QL.  Continue to progress activity tolerance.    Rennie Natter, PT, DPT  04/06/2022, 3:31 PM

## 2022-04-11 ENCOUNTER — Other Ambulatory Visit (HOSPITAL_BASED_OUTPATIENT_CLINIC_OR_DEPARTMENT_OTHER): Payer: Self-pay | Admitting: Family Medicine

## 2022-04-11 DIAGNOSIS — R059 Cough, unspecified: Secondary | ICD-10-CM

## 2022-04-12 ENCOUNTER — Other Ambulatory Visit: Payer: Self-pay | Admitting: Family Medicine

## 2022-04-12 DIAGNOSIS — Z1231 Encounter for screening mammogram for malignant neoplasm of breast: Secondary | ICD-10-CM

## 2022-04-13 ENCOUNTER — Encounter: Payer: Self-pay | Admitting: Physical Therapy

## 2022-04-13 ENCOUNTER — Ambulatory Visit: Payer: Medicare PPO | Admitting: Physical Therapy

## 2022-04-13 DIAGNOSIS — M6281 Muscle weakness (generalized): Secondary | ICD-10-CM

## 2022-04-13 DIAGNOSIS — M5459 Other low back pain: Secondary | ICD-10-CM | POA: Diagnosis not present

## 2022-04-13 DIAGNOSIS — R252 Cramp and spasm: Secondary | ICD-10-CM

## 2022-04-13 NOTE — Therapy (Signed)
OUTPATIENT PHYSICAL THERAPY TREATMENT     Patient Name: Heidi Lutz MRN: [id number] DOB:05-29-1940, 82 y.o., female Today's Date: 04/13/2022   PT End of Session - 04/13/22 1802     Visit Number 18    Number of Visits 24    Date for PT Re-Evaluation 05/25/22    Authorization Type [employer] MCR    Authorization Time Period 10 additional visits to 05/25/2022    Authorization - Number of Visits 10    Progress Note Due on Visit 24    PT Start Time 1403    PT Stop Time [id number]    PT Time Calculation (min) 40 min    Activity Tolerance Patient tolerated treatment well    Behavior During Therapy [facility] for tasks assessed/performed               Past Medical History:  Diagnosis Date   Cancer ([place]) 01/2017   left breast cancer, basal cell and 1 melanoma   Complication of anesthesia    heart rate spikeed in the PACU on her 2nd knee surgery   Environmental allergies    High cholesterol    OAB (overactive bladder) 02/2020   Osteoarthritis    Personal history of radiation therapy    PONV (postoperative nausea and vomiting)    with 1st knee surgery had n/v, none since   Raynaud disease    Raynauds disease-takes procardia   Rheumatoid arthritis ([place])    Past Surgical History:  Procedure Laterality Date   ABDOMINAL HYSTERECTOMY  1972   APPENDECTOMY  1953   BREAST BIOPSY Left 05/19/2020   benign   BREAST LUMPECTOMY Left 2019   BREAST LUMPECTOMY WITH RADIOACTIVE SEED AND SENTINEL LYMPH NODE BIOPSY Left 04/07/2017   Procedure: BREAST LUMPECTOMY WITH RADIOACTIVE SEED AND SENTINEL LYMPH NODE BIOPSY;  Surgeon: Erroll Luna, MD;  Location: [place];  Service: General;  Laterality: Left;   BUNIONECTOMY  1999   COLONOSCOPY     EYE SURGERY     bil cataract   GANGLION CYST EXCISION  1968   JOINT REPLACEMENT Left 2017    reverse shoulder replacement   REPLACEMENT TOTAL KNEE  2012, 2013   Right and Left   SHOULDER ARTHROSCOPY Left    [place]    Patient Active Problem List   Diagnosis Date Noted   Rheumatoid arthritis ([place])    Raynaud disease    PONV (postoperative nausea and vomiting)    Personal history of radiation therapy    Osteoarthritis    High cholesterol    Environmental allergies    Complication of anesthesia    OAB (overactive bladder) 02/2020   Age-related osteoporosis without current pathological fracture 10/31/2019   History of vertebral compression fracture 07/16/2019   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   Pleuritic chest pain 04/10/2018   GAD (generalized anxiety disorder) 04/25/2017   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   LBBB (left bundle branch block) 03/22/2017   Pre-operative cardiovascular examination 03/22/2017   Mixed hyperlipidemia 03/22/2017   Malignant neoplasm of central portion of left breast in female, estrogen receptor positive ([place]) 03/16/2017   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/2017   Osteopenia 03/03/2017   Cancer ([place]) 01/2017   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   Lumbar degenerative disc disease 02/27/2015   Lumbar spinal stenosis 02/27/2015   Osteoarthritis of spine with radiculopathy, lumbar region 02/27/2015   Basal cell carcinoma 12/15/2014   Closed compression fracture of  L1 lumbar vertebra, with routine healing, subsequent encounter 11/26/2013   Insomnia 02/02/2010   Hyperlipidemia 04/22/2009   Essential hypertension 09/19/2008   Allergic rhinitis 07/31/2007   IBS (irritable bowel syndrome) 08/18/2006   Raynaud's disease 08/18/2006    PCP: [name], MD  REFERRING PROVIDER: [name], MD  REFERRING DIAG: [phone] (ICD-10-CM) - Lumbar stenosis  Rationale for Evaluation and Treatment Rehabilitation  THERAPY DIAG:  Other low back pain  Cramp and spasm  Muscle weakness (generalized)  ONSET DATE: chronic since age 58  SUBJECTIVE:                                                                                                                                                                                            SUBJECTIVE STATEMENT: Heidi Lutz reports she is doing well today.   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  PAIN:  Are you having pain? Yes: NPRS scale: 1/10 Pain location: low back   Pain description: constant pain also gets "screaming meemees" 25/10 Aggravating factors: pressure/weather changes, walking more than 10 min, prolonged standing >10 min  Relieving factors: sitting down   PRECAUTIONS: None  WEIGHT BEARING RESTRICTIONS No  FALLS:  Has patient fallen in last 6 months? No  LIVING ENVIRONMENT: Lives with: lives with their spouse Lives in: House/apartment Stairs:  3rd floor apartment, lives in [place] Has following equipment at home: Gilford Rile - 2 wheeled  OCCUPATION: retired  PLOF: Independent  PATIENT GOALS improve pain   OBJECTIVE:   DIAGNOSTIC FINDINGS:  04/24/2019 COMPARISON: December 2016. September 2018.    INDICATION: Wedge compression fracture of unspecified thoracic vertebra, initial encounter for closed fracture (#).   TECHNIQUE: MRI SPINE THORACIC WO IV CONTRAST-- Multiplanar, multisequence MR imaging of the thoracic spine performed.   FINDINGS:  Osseous structures: Chronic mild L1 vertebral body compression fracture. Chronic minimal T11 inferior vertebral body compression fracture.  Alignment: Alignment maintained.  Spinal cord: No intrinsic spinal cord abnormality.  Paraspinal tissues: Unremarkable.  Contrast: None given.  Incidental: Trace right pleural effusion. Small seroma left breast.   DISC SPACES:   Incompletely characterized severe lower cervical spine DDD/facet arthrosis/uncovertebral joint spurring. Moderate left posterior lateral quadrants disc protrusion at T11-T12. Tiny scattered thoracic spine disc bulges/protrusions mostly concentrated in the upper thoracic spine.  PATIENT SURVEYS:  Modified Oswestry 19/50= 38% disability   SCREENING  FOR RED FLAGS: Bowel or bladder incontinence: No Spinal tumors: No Cauda equina syndrome: No Compression fracture: Yes: closed L1 compression fracture Abdominal aneurysm: No  COGNITION:  Overall cognitive status: Within functional limits for tasks assessed     SENSATION: No numbness or parasthesias  MUSCLE LENGTH: Hamstrings: Right 90 deg; Left 90 deg  POSTURE: rounded shoulders, forward head, decreased lumbar lordosis, and increased thoracic kyphosis, forward flexed posture   PALPATION: Tenderness throughout lumbar paraspinals, QL, glutes, piriformis bil.  Decreased mobility throughout lumbar spine, decreased lordotic curvature but no scoliosis.   LUMBAR ROM:   Active  AROM  eval  Flexion To knees  Extension To neutral *  Right lateral flexion To knees *  Left lateral flexion To knees *  Right rotation WNL  Left rotation WNL   (Blank rows = not tested)  *increased pain  LOWER EXTREMITY ROM:    good hip mobility bil for IR/ER  LOWER EXTREMITY MMT:    MMT Right* eval Left* eval  Hip flexion 4+ 4+  Hip extension 5 5  Hip abduction 5 5  Hip adduction 5 5  Knee flexion 5 5  Knee extension 5 5  Ankle dorsiflexion 5 5  Ankle plantarflexion 5 5   (Blank rows = not tested) *tested in sitting  LUMBAR SPECIAL TESTS:  Straight leg raise test: Negative and FABER test: Negative  FUNCTIONAL TESTS:  5 times sit to stand: 15 seconds  GAIT: Distance walked: 24' Assistive device utilized: None Level of assistance: Complete Independence Comments: forward flexed posture with gait.      TODAY'S TREATMENT  04/13/2022   Therapeutic Exercise: to improve strength and mobility.  Demo, verbal and tactile cues throughout for technique. Treadmill x 5 min at 2 mph, 3.0 incline Manual Therapy: to decrease muscle spasm and pain and improve mobility STM/TPR to bil lumbar paraspinals, R UPA mobs, STM/TPR to bil glutes, piriformis, and QL; skilled palpation and monitoring during dry needling. Trigger Point Dry-Needling  Treatment instructions: Expect mild to moderate muscle soreness. S/S of pneumothorax if dry needled over a lung field, and to seek immediate medical attention should they occur. Patient verbalized understanding of these instructions and education.  Patient Consent Given: Yes Education handout provided: Yes Muscles treated: bil lumbar multifidi L2-L5,bil glut med, &  piriformis Electrical stimulation performed: No Parameters: N/A Treatment response/outcome: Twitch Response Elicited and Palpable Increase in Muscle Length  04/06/2022  Therapeutic Exercise: to improve strength and mobility.  Demo, verbal and tactile cues throughout for technique. Treadmill x 5 min at 2 mph, 3.0 incline Manual Therapy: to decrease muscle spasm and pain and improve mobility STM/TPR to bil lumbar paraspinals, R UPA mobs, STM/TPR to bil glutes, piriformis, and QL; skilled palpation and monitoring during dry needling. Trigger Point Dry-Needling  Treatment instructions: Expect mild to moderate muscle soreness. S/S of pneumothorax if dry needled over a lung field, and to seek immediate medical attention should they occur. Patient verbalized understanding of these instructions and education.  Patient Consent Given: Yes Education handout provided: Yes Muscles treated: bil lumbar multifidi L2-L5,bil glut med, &  piriformis Electrical stimulation performed: No Parameters: N/A Treatment response/outcome: Twitch Response Elicited and Palpable Increase in Muscle Length  03/30/2022  Therapeutic Exercise: to improve strength and mobility.   Demo, verbal and tactile cues throughout for technique. Treadmill x 5 min at 2 mph, 3.0 incline Manual Therapy: to decrease muscle spasm and pain and improve mobility STM/TPR to bil lumbar paraspinals, R UPA mobs, STM/TPR to bil  glutes, piriformis, and QL; skilled palpation and monitoring during dry needling. Trigger Point Dry-Needling  Treatment instructions: Expect mild to moderate muscle soreness. S/S of pneumothorax if dry needled over a lung field, and to seek immediate medical attention should they occur. Patient verbalized understanding of these instructions and education.  Patient Consent Given: Yes Education handout provided: Yes Muscles treated: bil lumbar multifidi L2-L5,bil glut med, &  piriformis Electrical stimulation performed: No Parameters: N/A Treatment response/outcome: Twitch Response Elicited and Palpable Increase in Muscle Length   PATIENT EDUCATION:  Education details: continue HEP as tolerated, Person educated: Patient and Child(ren) Education method: Explanation and Demonstration Education comprehension: verbalized understanding and returned demonstration   HOME EXERCISE PROGRAM: Access Code: [email or web address] URL: [email or web address] Date: 03/23/2022 Prepared by: Glenetta Hew  Exercises - Supine Lower Trunk Rotation  - 1 x daily - 7 x weekly - 2 sets - 10 reps - Supine Posterior Pelvic Tilt  - 1 x daily - 7 x weekly - 2 sets - 10 reps - Seated Posterior Pelvic Tilt  - 1 x daily - 7 x weekly - 2 sets - 10 reps - Supine Bridge  - 1 x daily - 7 x weekly - 2 sets - 10 reps - Supine Single Leg Lift  - 1 x daily - 7 x weekly - 2 sets - 10 reps - Hooklying Clamshell with Resistance  - 1 x daily - 7 x weekly - 2 sets - 10 reps - Supine March  - 1 x daily - 7 x weekly - 3 sets - 10 reps  ASSESSMENT:  CLINICAL IMPRESSION: Mirena Belan reports pain is well controlled this week.  Discussed that now she has healed from MOHS surgery, she could  start water aerobics again, and participating 1x was goal for this week.  She reported no pain after interventions, but MHP applied to low back after session concluded while waiting in waiting room for husband.    Derrek Monaco would benefit from skilled physical therapy to decrease low back pain and improve activity tolerance.    OBJECTIVE IMPAIRMENTS decreased activity tolerance, difficulty walking, decreased strength, increased muscle spasms, improper body mechanics, postural dysfunction, and pain.   ACTIVITY LIMITATIONS lifting, standing, stairs, and locomotion level  PARTICIPATION LIMITATIONS: meal prep, cleaning, and community activity  PERSONAL FACTORS Time since onset of injury/illness/exacerbation and 3+ comorbidities: lumbar stenosis, RA, OA, Raynauds, history breast cancer, abdominal hysterectomy  are also affecting patient's functional outcome.   REHAB POTENTIAL: Good  CLINICAL DECISION MAKING: Evolving/moderate complexity  EVALUATION COMPLEXITY: Moderate   GOALS: Goals reviewed with patient? Yes  SHORT TERM GOALS: Target date: 12/08/2021   Patient will be independent with initial HEP.  Baseline: needs Goal status: MET 12/14/21- given 12/17/21- good compliance but cues needed for seated pelvic tilts    LONG TERM GOALS: Target date: 05/25/2022    Patient will be independent with advanced/ongoing HEP to improve outcomes and carryover.  Baseline: needs progression  Goal status: MET 12/29/2021- completes 2x/day  2.  Patient will report 50% improvement in low back pain to improve QOL.  Baseline: 6-25/10 low back pain Goal status: MET  01/05/22- 75% improvement 03/16/22- 100% improvement.   3.  Patient will be able to stand/walk with upright posture without increased LBP.   Baseline: forward flexed posture due to pian Goal status: IN PROGRESS 01/05/22 - able to walk short distances now without pain.   03/16/22- improving, able to stand/walk for longer periods now  before having to "perch"  to relieve back pain.   4.   Patient will report 12% improvement on Oswestry to demonstrate improved functional ability.  Baseline: 38% disability Goal status: IN PROGRESS  01/05/22- 34% 03/16/22- 28%   5.  Patient will tolerate 15 min of standing to perform ADLs. Baseline: 10 minutes before needing to sit Goal status: MET 01/05/22 - still limited to ~ 10 min 03/16/22- reports can stand for 15 minutes now.   6.  Patient will tolerate 15 min walking on treadmill to improve activity tolerance and exercise.  Baseline: 10 min max Goal status: IN PROGRESS 01/05/22- 6 minutes on treadmill  03/16/22- can walk halls for about 15 minutes now, still about 6 min on treadmill.    PLAN: PT FREQUENCY: 1x/week  PT DURATION: 10 weeks to 05/25/2022.   PLANNED INTERVENTIONS: Therapeutic exercises, Therapeutic activity, Neuromuscular re-education, Balance training, Gait training, Patient/Family education, Self Care, Joint mobilization, Dry Needling, Electrical stimulation, Spinal mobilization, Manual therapy, and Re-evaluation.  PLAN FOR NEXT SESSION: review core strengthening focusing on flexion/neutral and TrA activation, manual therapy/TrDN to lumbar multifidi/glutes/QL.  Continue to progress activity tolerance.    Rennie Natter, PT, DPT  04/13/2022, 6:07 PM

## 2022-04-14 NOTE — Progress Notes (Signed)
Patient Care Team: [name], MD as PCP - General (Family Medicine) Magrinat, [name], MD (Inactive) as Consulting Physician (Oncology) Erroll Luna, MD as Consulting Physician (General Surgery)  DIAGNOSIS: No diagnosis found.  SUMMARY OF ONCOLOGIC HISTORY: Oncology History  Malignant neoplasm of central portion of left breast in female, estrogen receptor positive ([place])  03/08/2017 Initial Diagnosis   Central left breast biopsy for a clinical T1b N0, stage IA i IDC, grade 1, e ER/PR positive, HER-2 not amplified, with an Ki-67 of 2%   04/07/2017 Surgery   left breast lumpectomy with sentinel lymph node sampling  pT1b pN0, stage IA invasive ductal carcinoma, grade II, with negative margins, total 1 lymph node removed from the axilla     CHIEF COMPLIANT: Follow-up of estrogen receptor positive breast cancer    INTERVAL HISTORY: Heidi Lutz is a  82 y.o. with above-mentioned history of estrogen receptor positive breast cancer. She presents to the clinic today for annual follow-up.     ALLERGIES:  is allergic to fluorouracil, codeine, hydrocodone, and penicillins.  MEDICATIONS:  Current Outpatient Medications  Medication Sig Dispense Refill   acetaminophen (TYLENOL) 500 MG tablet Take 1,000 mg by mouth every 6 (six) hours as needed for moderate pain or headache.      atorvastatin (LIPITOR) 20 MG tablet Take 20 mg by mouth daily.     Black Cohosh 540 MG CAPS Take 540 mg by mouth daily.      calcium gluconate 500 MG tablet Take 1 tablet (500 mg total) by mouth 2 (two) times daily.     cetirizine (ZYRTEC) 10 MG tablet Take 10 mg by mouth daily.     CRANBERRY PO Take 1 capsule by mouth daily.      Glucosamine-Chondroit-Vit C-Mn (GLUCOSAMINE-CHONDROITIN MAX ST) CAPS Take 1 capsule by mouth daily.      lidocaine (LIDODERM) 5 % APPLY 2 PATCHES TO SKIN EVERY DAY, REMOVE AND REPLACE PATCH AFTER 12 HRS 180 patch 0   losartan (COZAAR) 25 MG tablet Take 25 mg by mouth daily.      Magnesium 200 MG TABS Take 200 mg by mouth daily.      Multiple Vitamins-Minerals (CENTRUM ADULTS PO) Take 1 tablet by mouth daily.      NIFEdipine (PROCARDIA XL/ADALAT-CC) 90 MG 24 hr tablet Take 90 mg by mouth daily.      Probiotic Product (PROBIOTIC ADVANCED PO) Take 1 capsule by mouth daily.      solifenacin (VESICARE) 10 MG tablet Take by mouth.     tamoxifen (NOLVADEX) 20 MG tablet Take 1 tablet (20 mg total) by mouth daily. 90 tablet 4   traMADol (ULTRAM) 50 MG tablet Take 0.5 tablets (25 mg total) by mouth daily as needed. 30 tablet 0   No current facility-administered medications for this visit.    PHYSICAL EXAMINATION: ECOG PERFORMANCE STATUS: {CHL ONC ECOG [phone]}  There were no vitals filed for this visit. There were no vitals filed for this visit.  BREAST:*** No palpable masses or nodules in either right or left breasts. No palpable axillary supraclavicular or infraclavicular adenopathy no breast tenderness or nipple discharge. (exam performed in the presence of a chaperone)  LABORATORY DATA:  I have reviewed the data as listed    Latest Ref Rng & Units 04/19/2021   10:43 AM 04/15/2020   11:11 AM 04/15/2019   11:05 AM  CMP  Glucose 70 - 99 mg/dL 100  100  99   BUN 8 - 23 mg/dL  $'24  25  17   'x$ Creatinine 0.44 - 1.00 mg/dL 1.56  1.46  1.29   Sodium 135 - 145 mmol/L 141  138  143   Potassium 3.5 - 5.1 mmol/L 4.7  4.3  4.8   Chloride 98 - 111 mmol/L 106  102  104   CO2 22 - 32 mmol/L '26  25  27   '$ Calcium 8.9 - 10.3 mg/dL 9.8  9.8  9.3   Total Protein 6.5 - 8.1 g/dL 7.3  7.2  7.2   Total Bilirubin 0.3 - 1.2 mg/dL 0.6  0.6  0.6   Alkaline Phos 38 - 126 U/L 36  47  51   AST 15 - 41 U/L '17  18  16   '$ ALT 0 - 44 U/L '6  6  7     '$ Lab Results  Component Value Date   WBC 6.8 04/19/2021   HGB 12.1 04/19/2021   HCT 35.8 (L) 04/19/2021   MCV 98.4 04/19/2021   PLT 239 04/19/2021   NEUTROABS 3.2 04/19/2021    ASSESSMENT & PLAN:  No problem-specific Assessment &  Plan notes found for this encounter.    No orders of the defined types were placed in this encounter.  The patient has a good understanding of the overall plan. she agrees with it. she will call with any problems that may develop before the next visit here. Total time spent: 30 mins including face to face time and time spent for planning, charting and co-ordination of care   Suzzette Righter, [place] 04/14/22    I Gardiner Coins am acting as a Education administrator for [employer]  ***

## 2022-04-19 ENCOUNTER — Inpatient Hospital Stay: Payer: Medicare PPO | Attending: Hematology and Oncology | Admitting: Hematology and Oncology

## 2022-04-19 VITALS — BP 145/63 | HR 102 | Temp 97.7°F | Resp 20 | Wt 127.6 lb

## 2022-04-19 DIAGNOSIS — Z79899 Other long term (current) drug therapy: Secondary | ICD-10-CM | POA: Insufficient documentation

## 2022-04-19 DIAGNOSIS — C50112 Malignant neoplasm of central portion of left female breast: Secondary | ICD-10-CM

## 2022-04-19 DIAGNOSIS — Z79810 Long term (current) use of selective estrogen receptor modulators (SERMs): Secondary | ICD-10-CM | POA: Diagnosis not present

## 2022-04-19 DIAGNOSIS — Z923 Personal history of irradiation: Secondary | ICD-10-CM | POA: Insufficient documentation

## 2022-04-19 DIAGNOSIS — Z170 Estrogen receptor positive status [ER+]: Secondary | ICD-10-CM | POA: Diagnosis not present

## 2022-04-19 MED ORDER — CIPROFLOXACIN HCL 250 MG PO TABS: 250.0000 mg | ORAL_TABLET | Freq: Every day | ORAL | Status: DC

## 2022-04-19 NOTE — Assessment & Plan Note (Signed)
04/07/2017:left breast lumpectomy with sentinel lymph node sampling  pT1b pN0, stage IA invasive ductal carcinoma, grade II, with negative margins, total 1 lymph node removed from the axilla, ER 100%, PR 70%, HER2 negative ratio 1.39, Ki-67 2% adjuvant radiation 05/10/2017 - 06/06/2017   Current treatment: Tamoxifen started 03/17/2017 Plan of treatment: 10 years (patient preference)   Breast cancer surveillance: 1.  Breast MRI 11/11/2019: No evidence of malignancy  2. mammogram scheduled for 05/26/2022 3.  Breast exam 04/19/2022: Firmness in the left breast related to prior radiation scarring.  Otherwise no palpable lumps or nodules of concern.   Return to clinic in 1 year for follow-up

## 2022-04-20 ENCOUNTER — Ambulatory Visit (HOSPITAL_BASED_OUTPATIENT_CLINIC_OR_DEPARTMENT_OTHER)
Admission: RE | Admit: 2022-04-20 | Discharge: 2022-04-20 | Disposition: A | Discharge status: Home / Self Care | Payer: Medicare PPO | Source: Ambulatory Visit | Attending: Family Medicine | Admitting: Family Medicine

## 2022-04-20 ENCOUNTER — Encounter: Payer: Self-pay | Admitting: Physical Therapy

## 2022-04-20 ENCOUNTER — Ambulatory Visit: Payer: Medicare PPO | Admitting: Physical Therapy

## 2022-04-20 DIAGNOSIS — J439 Emphysema, unspecified: Secondary | ICD-10-CM | POA: Insufficient documentation

## 2022-04-20 DIAGNOSIS — I2510 Atherosclerotic heart disease of native coronary artery without angina pectoris: Secondary | ICD-10-CM | POA: Insufficient documentation

## 2022-04-20 DIAGNOSIS — R059 Cough, unspecified: Secondary | ICD-10-CM | POA: Diagnosis present

## 2022-04-20 DIAGNOSIS — M6281 Muscle weakness (generalized): Secondary | ICD-10-CM

## 2022-04-20 DIAGNOSIS — M5459 Other low back pain: Secondary | ICD-10-CM | POA: Diagnosis not present

## 2022-04-20 DIAGNOSIS — I700 Atherosclerosis of aorta: Secondary | ICD-10-CM | POA: Diagnosis not present

## 2022-04-20 DIAGNOSIS — R252 Cramp and spasm: Secondary | ICD-10-CM

## 2022-04-20 NOTE — Therapy (Signed)
OUTPATIENT PHYSICAL THERAPY TREATMENT     Patient Name: Heidi Lutz MRN: [id number] DOB:02-28-1940, 82 y.o., female Today's Date: 04/20/2022   PT End of Session - 04/20/22 1343     Visit Number 19    Number of Visits 24    Date for PT Re-Evaluation 05/25/22    Authorization Type [employer] MCR    Authorization Time Period 10 additional visits to 05/25/2022    Authorization - Number of Visits 10    Progress Note Due on Visit 24    PT Start Time 1345    PT Stop Time [id number]    PT Time Calculation (min) 44 min    Activity Tolerance Patient tolerated treatment well    Behavior During Therapy [facility] for tasks assessed/performed               Past Medical History:  Diagnosis Date   Cancer ([place]) 01/2017   left breast cancer, basal cell and 1 melanoma   Complication of anesthesia    heart rate spikeed in the PACU on her 2nd knee surgery   Environmental allergies    High cholesterol    OAB (overactive bladder) 02/2020   Osteoarthritis    Personal history of radiation therapy    PONV (postoperative nausea and vomiting)    with 1st knee surgery had n/v, none since   Raynaud disease    Raynauds disease-takes procardia   Rheumatoid arthritis ([place])    Past Surgical History:  Procedure Laterality Date   ABDOMINAL HYSTERECTOMY  1972   APPENDECTOMY  1953   BREAST BIOPSY Left 05/19/2020   benign   BREAST LUMPECTOMY Left 2019   BREAST LUMPECTOMY WITH RADIOACTIVE SEED AND SENTINEL LYMPH NODE BIOPSY Left 04/07/2017   Procedure: BREAST LUMPECTOMY WITH RADIOACTIVE SEED AND SENTINEL LYMPH NODE BIOPSY;  Surgeon: Erroll Luna, MD;  Location: [place];  Service: General;  Laterality: Left;   BUNIONECTOMY  1999   COLONOSCOPY     EYE SURGERY     bil cataract   GANGLION CYST EXCISION  1968   JOINT REPLACEMENT Left 2017    reverse shoulder replacement   REPLACEMENT TOTAL KNEE  2012, 2013   Right and Left   SHOULDER ARTHROSCOPY Left    [place]    Patient Active Problem List   Diagnosis Date Noted   Rheumatoid arthritis ([place])    Raynaud disease    PONV (postoperative nausea and vomiting)    Personal history of radiation therapy    Osteoarthritis    High cholesterol    Environmental allergies    Complication of anesthesia    OAB (overactive bladder) 02/2020   Age-related osteoporosis without current pathological fracture 10/31/2019   History of vertebral compression fracture 07/16/2019   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   Pleuritic chest pain 04/10/2018   GAD (generalized anxiety disorder) 04/25/2017   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   LBBB (left bundle branch block) 03/22/2017   Pre-operative cardiovascular examination 03/22/2017   Mixed hyperlipidemia 03/22/2017   Malignant neoplasm of central portion of left breast in female, estrogen receptor positive ([place]) 03/16/2017   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/2017   Osteopenia 03/03/2017   Cancer ([place]) 01/2017   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   Lumbar degenerative disc disease 02/27/2015   Lumbar spinal stenosis 02/27/2015   Osteoarthritis of spine with radiculopathy, lumbar region 02/27/2015   Basal cell carcinoma 12/15/2014   Closed compression fracture of  L1 lumbar vertebra, with routine healing, subsequent encounter 11/26/2013   Insomnia 02/02/2010   Hyperlipidemia 04/22/2009   Essential hypertension 09/19/2008   Allergic rhinitis 07/31/2007   IBS (irritable bowel syndrome) 08/18/2006   Raynaud's disease 08/18/2006    PCP: [name], MD  REFERRING PROVIDER: [name], MD  REFERRING DIAG: [phone] (ICD-10-CM) - Lumbar stenosis  Rationale for Evaluation and Treatment Rehabilitation  THERAPY DIAG:  Other low back pain  Cramp and spasm  Muscle weakness (generalized)  ONSET DATE: chronic since age 23  SUBJECTIVE:                                                                                                                                                                                            SUBJECTIVE STATEMENT: [name] reports "little bit of pain this morning which I blame on the barametric pressure, otherwise fine."  Standing is still the hardest thing, ok if can move around.   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  PAIN:  Are you having pain? Yes: NPRS scale: 1/10 Pain location: low back   Pain description: constant pain also gets "screaming meemees" 25/10 Aggravating factors: pressure/weather changes, walking more than 10 min, prolonged standing >10 min  Relieving factors: sitting down   PRECAUTIONS: None  WEIGHT BEARING RESTRICTIONS No  FALLS:  Has patient fallen in last 6 months? No  LIVING ENVIRONMENT: Lives with: lives with their spouse Lives in: House/apartment Stairs:  3rd floor apartment, lives in [place] Has following equipment at home: Gilford Rile - 2 wheeled  OCCUPATION: retired  PLOF: Independent  PATIENT GOALS improve pain   OBJECTIVE:   DIAGNOSTIC FINDINGS:  04/24/2019 COMPARISON: December 2016. September 2018.    INDICATION: Wedge compression fracture of unspecified thoracic vertebra, initial encounter for closed fracture (#).   TECHNIQUE: MRI SPINE THORACIC WO IV CONTRAST-- Multiplanar, multisequence MR imaging of the thoracic spine performed.   FINDINGS:  Osseous structures: Chronic mild L1 vertebral body compression fracture. Chronic minimal T11 inferior vertebral body compression fracture.  Alignment: Alignment maintained.  Spinal cord: No intrinsic spinal cord abnormality.  Paraspinal tissues:  Unremarkable.  Contrast: None given.  Incidental: Trace right pleural effusion. Small seroma left breast.   DISC SPACES:  Incompletely characterized severe lower cervical spine DDD/facet arthrosis/uncovertebral joint spurring. Moderate left posterior lateral quadrants disc protrusion at T11-T12. Tiny scattered thoracic  spine disc bulges/protrusions mostly concentrated in the upper thoracic spine.  PATIENT SURVEYS:  Modified Oswestry 19/50= 38% disability   SCREENING FOR RED FLAGS: Bowel or bladder incontinence: No Spinal tumors: No Cauda equina syndrome: No Compression fracture: Yes: closed L1 compression fracture Abdominal aneurysm: No  COGNITION:  Overall cognitive status: Within functional limits for tasks assessed     SENSATION: No numbness or parasthesias  MUSCLE LENGTH: Hamstrings: Right 90 deg; Left 90 deg  POSTURE: rounded shoulders, forward head, decreased lumbar lordosis, and increased thoracic kyphosis, forward flexed posture   PALPATION: Tenderness throughout lumbar paraspinals, QL, glutes, piriformis bil.  Decreased mobility throughout lumbar spine, decreased lordotic curvature but no scoliosis.   LUMBAR ROM:   Active  AROM  eval  Flexion To knees  Extension To neutral *  Right lateral flexion To knees *  Left lateral flexion To knees *  Right rotation WNL  Left rotation WNL   (Blank rows = not tested)  *increased pain  LOWER EXTREMITY ROM:    good hip mobility bil for IR/ER  LOWER EXTREMITY MMT:    MMT Right* eval Left* eval  Hip flexion 4+ 4+  Hip extension 5 5  Hip abduction 5 5  Hip adduction 5 5  Knee flexion 5 5  Knee extension 5 5  Ankle dorsiflexion 5 5  Ankle plantarflexion 5 5   (Blank rows = not tested) *tested in sitting  LUMBAR SPECIAL TESTS:  Straight leg raise test: Negative and FABER test: Negative  FUNCTIONAL TESTS:  5 times sit to stand: 15 seconds  GAIT: Distance walked: 55' Assistive device utilized: None Level of  assistance: Complete Independence Comments: forward flexed posture with gait.      TODAY'S TREATMENT  04/20/2022  Therapeutic Exercise: to improve strength and mobility.  Demo, verbal and tactile cues throughout for technique. Treadmill x 6 min at 2 mph, 3.0 incline Manual Therapy: to decrease muscle spasm and pain and improve mobility STM/TPR to bil lumbar paraspinals, R UPA mobs, STM/TPR to bil glutes, piriformis, and QL; skilled palpation and monitoring during dry needling. Trigger Point Dry-Needling  Treatment instructions: Expect mild to moderate muscle soreness. S/S of pneumothorax if dry needled over a lung field, and to seek immediate medical attention should they occur. Patient verbalized understanding of these instructions and education.  Patient Consent Given: Yes Education handout provided: Yes Muscles treated: bil lumbar multifidi L2-L5,bil glut med, &  piriformis Electrical stimulation performed: No Parameters: N/A Treatment response/outcome: Twitch Response Elicited and Palpable Increase in Muscle Length  04/13/2022  Therapeutic Exercise: to improve strength and mobility.  Demo, verbal and tactile cues throughout for technique. Treadmill x 5 min at 2 mph, 3.0 incline Manual Therapy: to decrease muscle spasm and pain and improve mobility STM/TPR to bil lumbar paraspinals, R UPA mobs, STM/TPR to bil glutes, piriformis, and QL; skilled palpation and monitoring during dry needling. Trigger Point Dry-Needling  Treatment instructions: Expect mild to moderate muscle soreness. S/S of pneumothorax if dry needled over a lung field, and to seek immediate medical attention should they occur. Patient verbalized understanding of these instructions and education.  Patient Consent Given: Yes Education handout provided: Yes Muscles treated: bil lumbar multifidi L2-L5,bil glut med, &  piriformis Electrical stimulation performed: No Parameters: N/A Treatment response/outcome: Twitch  Response Elicited and Palpable Increase in Muscle Length  04/06/2022  Therapeutic Exercise: to improve strength and mobility.  Demo, verbal and tactile cues throughout for technique. Treadmill x 5 min at 2 mph, 3.0 incline  Manual Therapy: to decrease muscle spasm and pain and improve mobility STM/TPR to bil lumbar paraspinals, R UPA mobs, STM/TPR to bil glutes, piriformis, and QL; skilled palpation and monitoring during dry needling. Trigger Point Dry-Needling  Treatment instructions: Expect mild to moderate muscle soreness. S/S of pneumothorax if dry needled over a lung field, and to seek immediate medical attention should they occur. Patient verbalized understanding of these instructions and education.  Patient Consent Given: Yes Education handout provided: Yes Muscles treated: bil lumbar multifidi L2-L5,bil glut med, &  piriformis Electrical stimulation performed: No Parameters: N/A Treatment response/outcome: Twitch Response Elicited and Palpable Increase in Muscle Length   PATIENT EDUCATION:  Education details: continue HEP as tolerated, Person educated: Patient and Child(ren) Education method: Explanation and Demonstration Education comprehension: verbalized understanding and returned demonstration   HOME EXERCISE PROGRAM: Access Code: [email or web address] URL: [email or web address] Date: 03/23/2022 Prepared by: Glenetta Hew  Exercises - Supine Lower Trunk Rotation  - 1 x daily - 7 x weekly - 2 sets - 10 reps - Supine Posterior Pelvic Tilt  - 1 x daily - 7 x weekly - 2 sets - 10 reps - Seated Posterior Pelvic Tilt  - 1 x daily - 7 x weekly - 2 sets - 10 reps - Supine Bridge  - 1 x daily - 7 x weekly - 2 sets - 10 reps - Supine Single Leg Lift  - 1 x daily - 7 x weekly - 2 sets - 10 reps - Hooklying Clamshell with Resistance  - 1 x daily - 7 x weekly - 2 sets - 10 reps - Supine March  - 1 x daily - 7 x weekly - 3 sets - 10 reps  ASSESSMENT:  CLINICAL  IMPRESSION: Syerra Biskner was not able to attend aquatic therapy this week due to doctor visits.  She reports annual visit to oncologist went well and will return in 1 year, no problems identified.  Her back pain continues to be well controlled with current plan of care, but still has difficulty standing for any prolonged period.  She responded well to manual therapy and reported no pain at end of session.    Derrek Monaco would benefit from skilled physical therapy to decrease low back pain and improve activity tolerance.    OBJECTIVE IMPAIRMENTS decreased activity tolerance, difficulty walking, decreased strength, increased muscle spasms, improper body mechanics, postural dysfunction, and pain.   ACTIVITY LIMITATIONS lifting, standing, stairs, and locomotion level  PARTICIPATION LIMITATIONS: meal prep, cleaning, and community activity  PERSONAL FACTORS Time since onset of injury/illness/exacerbation and 3+ comorbidities: lumbar stenosis, RA, OA, Raynauds, history breast cancer, abdominal hysterectomy  are also affecting patient's functional outcome.   REHAB POTENTIAL: Good  CLINICAL DECISION MAKING: Evolving/moderate complexity  EVALUATION COMPLEXITY: Moderate   GOALS: Goals reviewed with patient? Yes  SHORT TERM GOALS: Target date: 12/08/2021   Patient will be independent with initial HEP.  Baseline: needs Goal status: MET 12/14/21- given 12/17/21- good compliance but cues needed for seated pelvic tilts    LONG TERM GOALS: Target date: 05/25/2022    Patient will be independent with advanced/ongoing HEP to improve outcomes and carryover.  Baseline: needs progression  Goal status: MET 12/29/2021- completes 2x/day  2.  Patient will report 50% improvement in low back pain to improve QOL.  Baseline: 6-25/10 low back pain Goal status: MET  01/05/22- 75% improvement 03/16/22- 100% improvement.   3.  Patient will be able to stand/walk with upright posture without  increased LBP.  Baseline: forward flexed posture due to pian Goal status: IN PROGRESS 01/05/22 - able to walk short distances now without pain.   03/16/22- improving, able to stand/walk for longer periods now before having to "perch" to relieve back pain.   4.   Patient will report 12% improvement on Oswestry to demonstrate improved functional ability.  Baseline: 38% disability Goal status: IN PROGRESS  01/05/22- 34% 03/16/22- 28%   5.  Patient will tolerate 15 min of standing to perform ADLs. Baseline: 10 minutes before needing to sit Goal status: MET 01/05/22 - still limited to ~ 10 min 03/16/22- reports can stand for 15 minutes now.   6.  Patient will tolerate 15 min walking on treadmill to improve activity tolerance and exercise.  Baseline: 10 min max Goal status: IN PROGRESS 01/05/22- 6 minutes on treadmill  03/16/22- can walk halls for about 15 minutes now, still about 6 min on treadmill.    PLAN: PT FREQUENCY: 1x/week  PT DURATION: 10 weeks to 05/25/2022.   PLANNED INTERVENTIONS: Therapeutic exercises, Therapeutic activity, Neuromuscular re-education, Balance training, Gait training, Patient/Family education, Self Care, Joint mobilization, Dry Needling, Electrical stimulation, Spinal mobilization, Manual therapy, and Re-evaluation.  PLAN FOR NEXT SESSION:  manual therapy/TrDN to lumbar multifidi/glutes/QL.  Continue to progress activity tolerance.    Rennie Natter, PT, DPT  04/20/2022, 5:42 PM

## 2022-04-24 IMAGING — MG MM DIGITAL SCREENING BILAT W/ TOMO AND CAD
8 series · 9 of 24 positions shown · non-contrast
Comparison: Previous exam(s).

CLINICAL DATA: Screening. 81-year-old female with history of left
breast cancer post lumpectomy 6314 with progressive left breast
shrinkage and firmness with ultrasound-guided core biopsy ofp an
area in the left breast demonstrating dense stromal fibrosis.

EXAM:
DIGITAL SCREENING BILATERAL MAMMOGRAM WITH TOMOSYNTHESIS AND CAD
TECHNIQUE: Bilateral screening digital craniocaudal and mediolateral oblique
mammograms were obtained. Bilateral screening digital breast
tomosynthesis was performed. The images were evaluated with
computer-aided detection.

[R CC synth-2D]
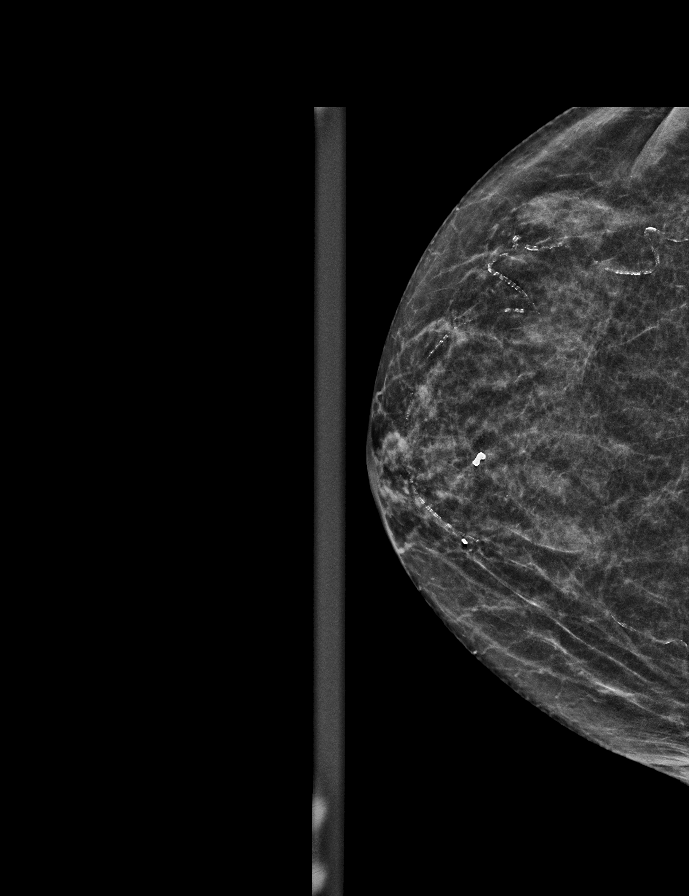

[L MLO synth-2D]
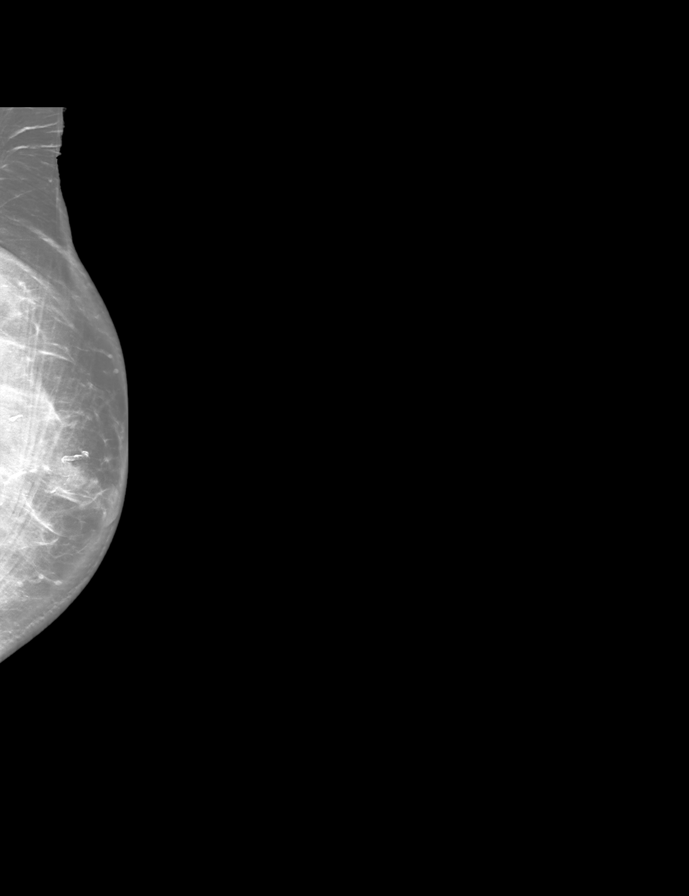

[R MLO synth-2D]
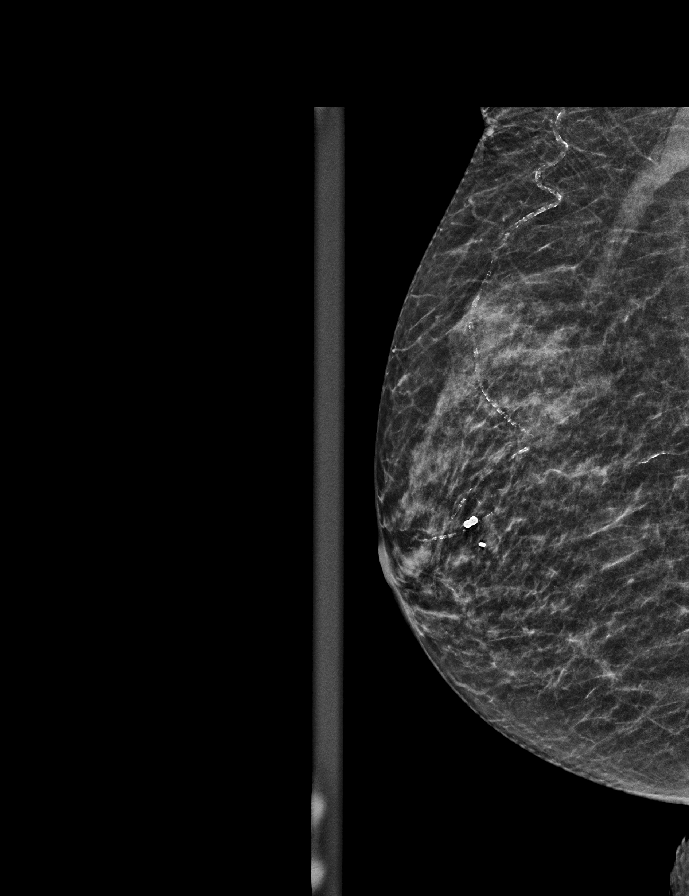

[L CC synth-2D]
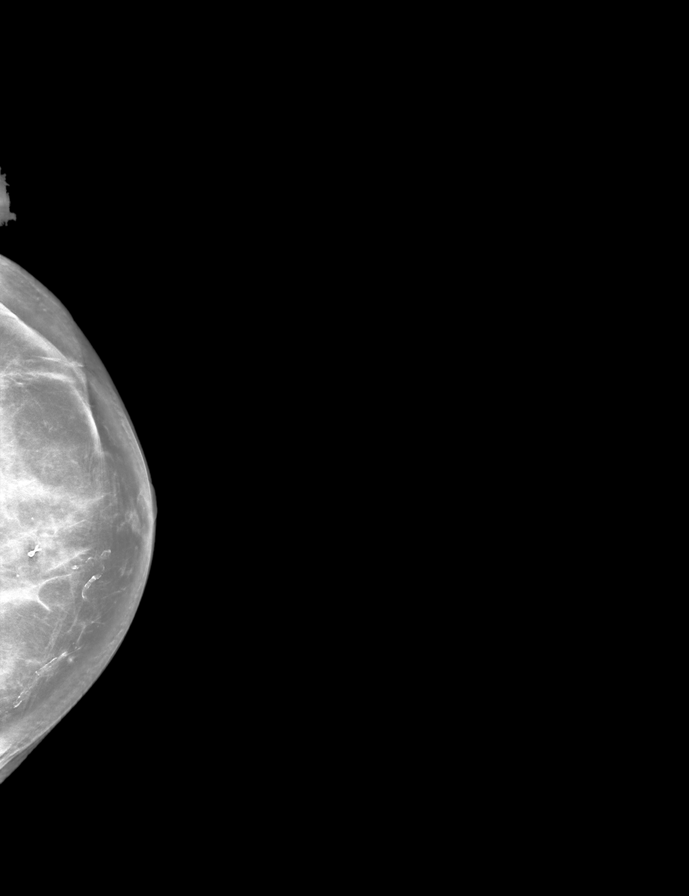

[L CC tomo · 2 of 93 frames shown]
[frame 30/93]
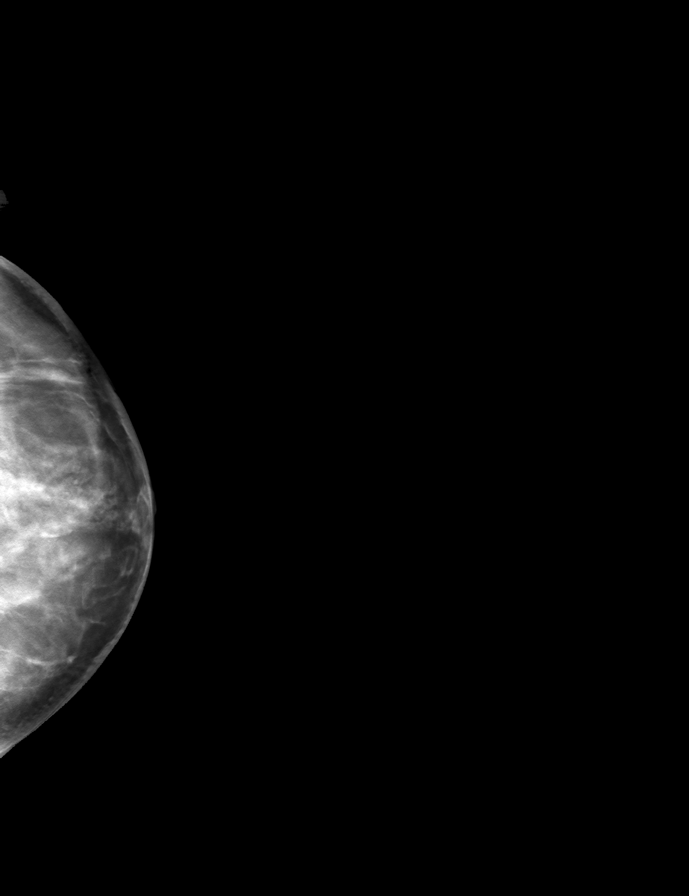
[frame 47/93]
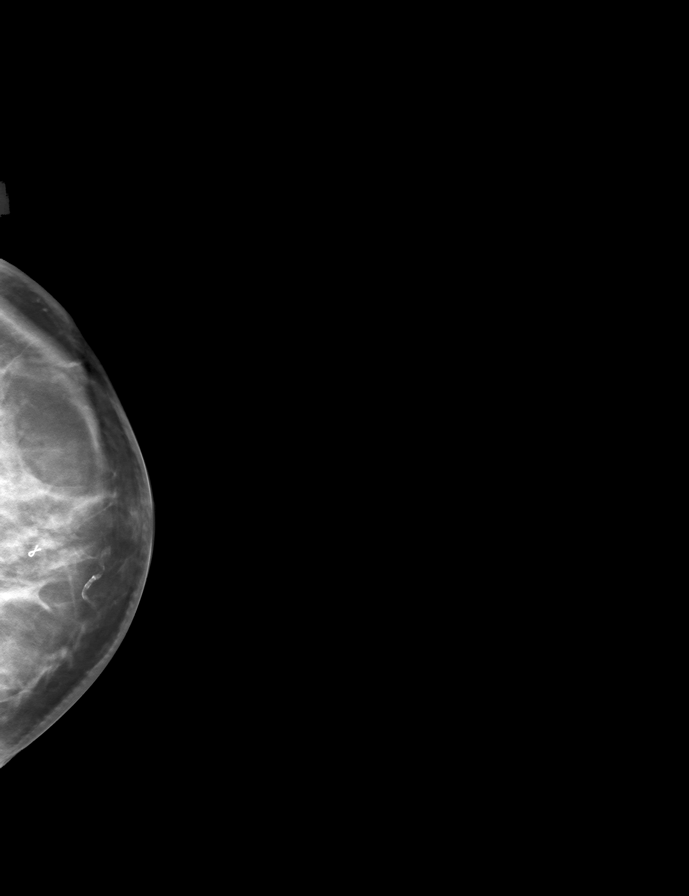

[R CC tomo · tomo slice 23/45.0]
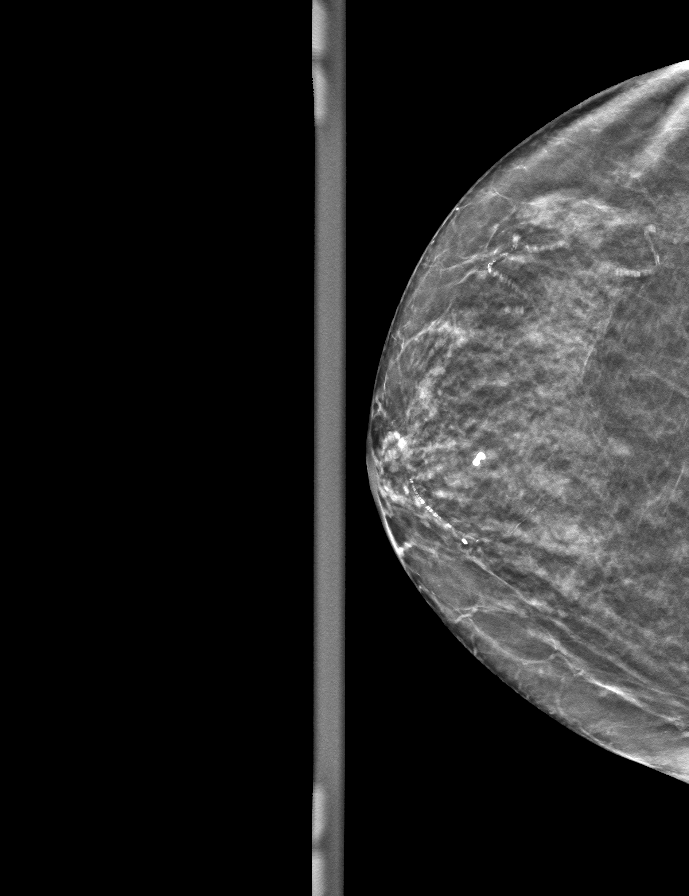

[L MLO tomo · tomo slice 40/79.0]
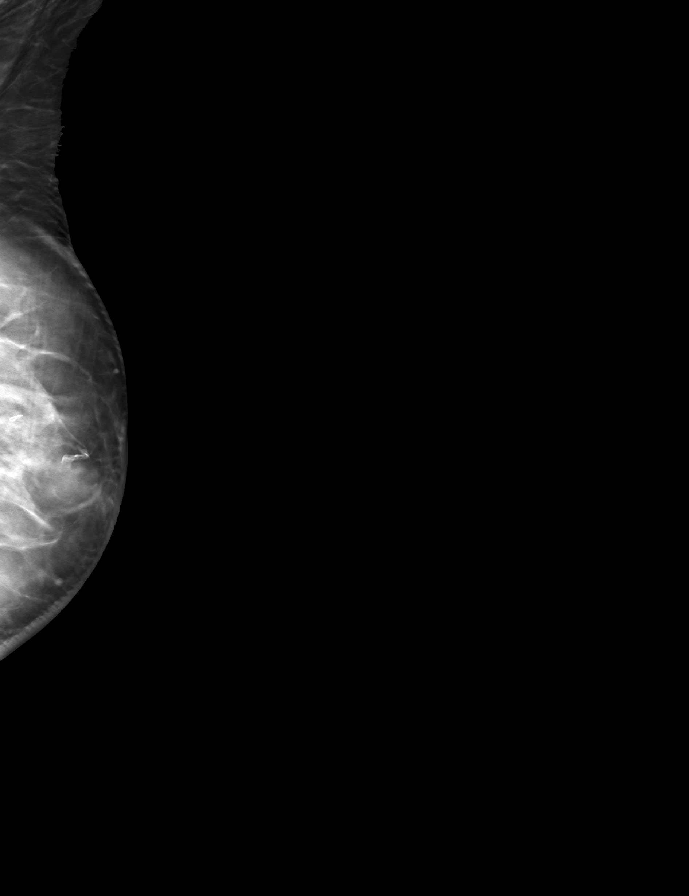

[R MLO tomo · tomo slice 23/44.0]
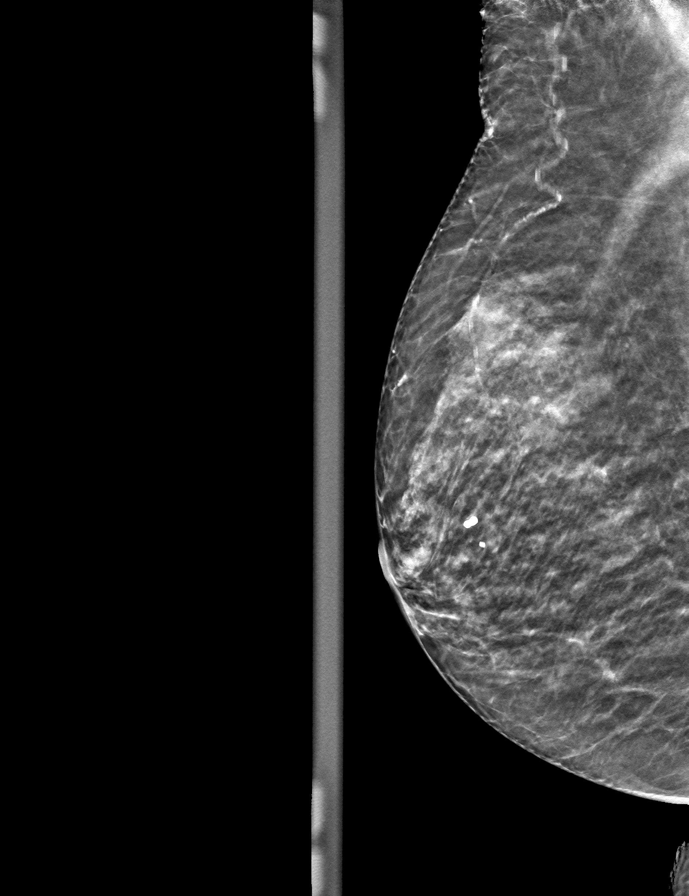

[9 of 24 positions shown; findings below may reference images not displayed]

ACR Breast Density Category c: The breast tissue is heterogeneously
dense, which may obscure small masses.
FINDINGS: There are no findings suspicious for malignancy.
IMPRESSION: No mammographic evidence of malignancy. A result letter of this
screening mammogram will be mailed directly to the patient.

RECOMMENDATION:
Screening mammogram in one year. (Code:[id number]-[id number])

BI-RADS CATEGORY  1: Negative.

## 2022-04-27 ENCOUNTER — Encounter: Payer: Self-pay | Admitting: Physical Therapy

## 2022-04-27 ENCOUNTER — Ambulatory Visit: Payer: Medicare PPO | Attending: Family Medicine | Admitting: Physical Therapy

## 2022-04-27 DIAGNOSIS — R252 Cramp and spasm: Secondary | ICD-10-CM | POA: Diagnosis present

## 2022-04-27 DIAGNOSIS — M6281 Muscle weakness (generalized): Secondary | ICD-10-CM | POA: Insufficient documentation

## 2022-04-27 DIAGNOSIS — M5459 Other low back pain: Secondary | ICD-10-CM | POA: Diagnosis present

## 2022-04-27 NOTE — Therapy (Signed)
OUTPATIENT PHYSICAL THERAPY TREATMENT     Patient Name: Heidi Lutz MRN: [id number] DOB:Jul 31, 1940, 82 y.o., female Today's Date: 04/27/2022   PT End of Session - 04/27/22 1400     Visit Number 20    Number of Visits 24    Date for PT Re-Evaluation 05/25/22    Authorization Type [employer] M[employer]    Authorization Time Period 10 additional visits to 05/25/2022    Authorization - Number of Visits 10    Progress Note Due on Visit 24    PT Start Time 1400    PT Stop Time 1440    PT Time Calculation (min) 40 min    Activity Tolerance Patient tolerated treatment well    Behavior During Therapy [facility] for tasks assessed/performed               Past Medical History:  Diagnosis Date   Cancer ([place]) 01/2017   left breast cancer, basal cell and 1 melanoma   Complication of anesthesia    heart rate spikeed in the PACU on her 2nd knee surgery   Environmental allergies    High cholesterol    OAB (overactive bladder) 02/2020   Osteoarthritis    Personal history of radiation therapy    PONV (postoperative nausea and vomiting)    with 1st knee surgery had n/v, none since   Raynaud disease    Raynauds disease-takes procardia   Rheumatoid arthritis ([place])    Past Surgical History:  Procedure Laterality Date   ABDOMINAL HYSTERECTOMY  1972   APPENDECTOMY  1953   BREAST BIOPSY Left 05/19/2020   benign   BREAST LUMPECTOMY Left 2019   BREAST LUMPECTOMY WITH RADIOACTIVE SEED AND SENTINEL LYMPH NODE BIOPSY Left 04/07/2017   Procedure: BREAST LUMPECTOMY WITH RADIOACTIVE SEED AND SENTINEL LYMPH NODE BIOPSY;  Surgeon: Erroll Luna, MD;  Location: [facility];  Service: General;  Laterality: Left;   BUNIONECTOMY  1999   COLONOSCOPY     EYE SURGERY     bil cataract   GANGLION CYST EXCISION  1968   JOINT REPLACEMENT Left 2017    reverse shoulder replacement   REPLACEMENT TOTAL KNEE  2012, 2013   Right and Left   SHOULDER ARTHROSCOPY Left    [place]    Patient Active Problem List   Diagnosis Date Noted   Rheumatoid arthritis ([place])    Raynaud disease    PONV (postoperative nausea and vomiting)    Personal history of radiation therapy    Osteoarthritis    High cholesterol    Environmental allergies    Complication of anesthesia    OAB (overactive bladder) 02/2020   Age-related osteoporosis without current pathological fracture 10/31/2019   History of vertebral compression fracture 07/16/2019   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   Pleuritic chest pain 04/10/2018   GAD (generalized anxiety disorder) 04/25/2017   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   LBBB (left bundle branch block) 03/22/2017   Pre-operative cardiovascular examination 03/22/2017   Mixed hyperlipidemia 03/22/2017   Malignant neoplasm of central portion of left breast in female, estrogen receptor positive ([place]) 03/16/2017   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/2017   Osteopenia 03/03/2017   Cancer ([place]) 01/2017   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   Lumbar degenerative disc disease 02/27/2015   Lumbar spinal stenosis 02/27/2015   Osteoarthritis of spine with radiculopathy, lumbar region 02/27/2015   Basal cell carcinoma 12/15/2014   Closed compression fracture of  L1 lumbar vertebra, with routine healing, subsequent encounter 11/26/2013   Insomnia 02/02/2010   Hyperlipidemia 04/22/2009   Essential hypertension 09/19/2008   Allergic rhinitis 07/31/2007   IBS (irritable bowel syndrome) 08/18/2006   Raynaud's disease 08/18/2006    PCP: [name], MD  REFERRING PROVIDER: [name], MD  REFERRING DIAG: [phone] (ICD-10-CM) - Lumbar stenosis  Rationale for Evaluation and Treatment Rehabilitation  THERAPY DIAG:  Other low back pain  Cramp and spasm  Muscle weakness (generalized)  ONSET DATE: chronic since age 75  SUBJECTIVE:                                                                                                                                                                                            SUBJECTIVE STATEMENT: Essiemae Mater reports she attended water aerobics for 45 minutes on Monday and today.  Had the "screaming meemies" getting out of water but back calmed back down after sitting down.    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  PAIN:  Are you having pain? Yes: NPRS scale: 1/10 Pain location: low back   Pain description: constant pain also gets "screaming meemees" 25/10 Aggravating factors: pressure/weather changes, walking more than 10 min, prolonged standing >10 min  Relieving factors: sitting down   PRECAUTIONS: None  WEIGHT BEARING RESTRICTIONS No  FALLS:  Has patient fallen in last 6 months? No  LIVING ENVIRONMENT: Lives with: lives with their spouse Lives in: House/apartment Stairs:  3rd floor apartment, lives in [place] Has following equipment at home: Gilford Rile - 2 wheeled  OCCUPATION: retired  PLOF: Independent  PATIENT GOALS improve pain   OBJECTIVE:   DIAGNOSTIC FINDINGS:  04/24/2019 COMPARISON: December 2016. September 2018.    INDICATION: Wedge compression fracture of unspecified thoracic vertebra, initial encounter for closed fracture (#).   TECHNIQUE: MRI SPINE THORACIC WO IV CONTRAST-- Multiplanar, multisequence MR imaging of the thoracic spine performed.   FINDINGS:  Osseous structures: Chronic mild L1 vertebral body compression fracture. Chronic minimal T11 inferior vertebral body compression fracture.  Alignment: Alignment maintained.  Spinal cord: No intrinsic spinal cord abnormality.  Paraspinal tissues:  Unremarkable.  Contrast: None given.  Incidental: Trace right pleural effusion. Small seroma left breast.   DISC SPACES:  Incompletely characterized severe lower cervical spine DDD/facet arthrosis/uncovertebral joint spurring. Moderate left posterior lateral quadrants disc protrusion at T11-T12. Tiny  scattered thoracic spine disc bulges/protrusions mostly concentrated in the upper thoracic spine.  PATIENT SURVEYS:  Modified Oswestry 19/50= 38% disability   SCREENING FOR RED FLAGS: Bowel or bladder incontinence: No Spinal tumors: No Cauda equina syndrome: No Compression fracture: Yes: closed L1 compression fracture Abdominal aneurysm: No  COGNITION:  Overall cognitive status: Within functional limits for tasks assessed     SENSATION: No numbness or parasthesias  MUSCLE LENGTH: Hamstrings: Right 90 deg; Left 90 deg  POSTURE: rounded shoulders, forward head, decreased lumbar lordosis, and increased thoracic kyphosis, forward flexed posture   PALPATION: Tenderness throughout lumbar paraspinals, QL, glutes, piriformis bil.  Decreased mobility throughout lumbar spine, decreased lordotic curvature but no scoliosis.   LUMBAR ROM:   Active  AROM  eval  Flexion To knees  Extension To neutral *  Right lateral flexion To knees *  Left lateral flexion To knees *  Right rotation WNL  Left rotation WNL   (Blank rows = not tested)  *increased pain  LOWER EXTREMITY ROM:    good hip mobility bil for IR/ER  LOWER EXTREMITY MMT:    MMT Right* eval Left* eval  Hip flexion 4+ 4+  Hip extension 5 5  Hip abduction 5 5  Hip adduction 5 5  Knee flexion 5 5  Knee extension 5 5  Ankle dorsiflexion 5 5  Ankle plantarflexion 5 5   (Blank rows = not tested) *tested in sitting  LUMBAR SPECIAL TESTS:  Straight leg raise test: Negative and FABER test: Negative  FUNCTIONAL TESTS:  5 times sit to stand: 15 seconds  GAIT: Distance walked: 83' Assistive device utilized: None Level of  assistance: Complete Independence Comments: forward flexed posture with gait.      TODAY'S TREATMENT   04/27/2022  Manual Therapy: to decrease muscle spasm and pain and improve mobility STM/TPR to bil lumbar paraspinals, R UPA mobs, STM/TPR to bil glutes, piriformis, and QL; skilled palpation and monitoring during dry needling. Trigger Point Dry-Needling  Treatment instructions: Expect mild to moderate muscle soreness. S/S of pneumothorax if dry needled over a lung field, and to seek immediate medical attention should they occur. Patient verbalized understanding of these instructions and education.  Patient Consent Given: Yes Education handout provided: Yes Muscles treated: bil lumbar multifidi L2-L5,bil glut med, &  piriformis Electrical stimulation performed: No Parameters: N/A Treatment response/outcome: Twitch Response Elicited and Palpable Increase in Muscle Length   04/20/2022  Therapeutic Exercise: to improve strength and mobility.  Demo, verbal and tactile cues throughout for technique. Treadmill x 6 min at 2 mph, 3.0 incline Manual Therapy: to decrease muscle spasm and pain and improve mobility STM/TPR to bil lumbar paraspinals, R UPA mobs, STM/TPR to bil glutes, piriformis, and QL; skilled palpation and monitoring during dry needling. Trigger Point Dry-Needling  Treatment instructions: Expect mild to moderate muscle soreness. S/S of pneumothorax if dry needled over a lung field, and to seek immediate medical attention should they occur. Patient verbalized understanding of these instructions and education.  Patient Consent Given: Yes Education handout provided: Yes Muscles treated: bil lumbar multifidi L2-L5,bil glut med, &  piriformis Electrical stimulation performed: No Parameters: N/A Treatment response/outcome: Twitch Response Elicited and Palpable Increase in Muscle Length  04/13/2022  Therapeutic Exercise: to improve strength and mobility.  Demo, verbal and tactile cues  throughout for technique. Treadmill x 5 min at 2 mph, 3.0 incline Manual Therapy: to decrease muscle spasm and pain and improve mobility STM/TPR to bil lumbar paraspinals, R UPA mobs, STM/TPR to  bil glutes, piriformis, and QL; skilled palpation and monitoring during dry needling. Trigger Point Dry-Needling  Treatment instructions: Expect mild to moderate muscle soreness. S/S of pneumothorax if dry needled over a lung field, and to seek immediate medical attention should they occur. Patient verbalized understanding of these instructions and education.  Patient Consent Given: Yes Education handout provided: Yes Muscles treated: bil lumbar multifidi L2-L5,bil glut med, &  piriformis Electrical stimulation performed: No Parameters: N/A Treatment response/outcome: Twitch Response Elicited and Palpable Increase in Muscle Length   PATIENT EDUCATION:  Education details: continue HEP as tolerated, Person educated: Patient and Child(ren) Education method: Explanation and Demonstration Education comprehension: verbalized understanding and returned demonstration   HOME EXERCISE PROGRAM: Access Code: [email or web address] URL: [email or web address] Date: 03/23/2022 Prepared by: Glenetta Hew  Exercises - Supine Lower Trunk Rotation  - 1 x daily - 7 x weekly - 2 sets - 10 reps - Supine Posterior Pelvic Tilt  - 1 x daily - 7 x weekly - 2 sets - 10 reps - Seated Posterior Pelvic Tilt  - 1 x daily - 7 x weekly - 2 sets - 10 reps - Supine Bridge  - 1 x daily - 7 x weekly - 2 sets - 10 reps - Supine Single Leg Lift  - 1 x daily - 7 x weekly - 2 sets - 10 reps - Hooklying Clamshell with Resistance  - 1 x daily - 7 x weekly - 2 sets - 10 reps - Supine March  - 1 x daily - 7 x weekly - 3 sets - 10 reps  ASSESSMENT:  CLINICAL IMPRESSION: Chera Fladung reports starting back to aquatic exercise this week, so more soreness and increased incidence of "screaming meemies" 10/10 pain, but  recovering quickly.  Today focused on manual therapy to decrease muscle tightness, noted more trigger points this week, followed by MHP after session while waiting for husband in waiting room.  No pain reported after interventions.   Derrek Monaco would benefit from skilled physical therapy to decrease low back pain and improve activity tolerance.    OBJECTIVE IMPAIRMENTS decreased activity tolerance, difficulty walking, decreased strength, increased muscle spasms, improper body mechanics, postural dysfunction, and pain.   ACTIVITY LIMITATIONS lifting, standing, stairs, and locomotion level  PARTICIPATION LIMITATIONS: meal prep, cleaning, and community activity  PERSONAL FACTORS Time since onset of injury/illness/exacerbation and 3+ comorbidities: lumbar stenosis, RA, OA, Raynauds, history breast cancer, abdominal hysterectomy  are also affecting patient's functional outcome.   REHAB POTENTIAL: Good  CLINICAL DECISION MAKING: Evolving/moderate complexity  EVALUATION COMPLEXITY: Moderate   GOALS: Goals reviewed with patient? Yes  SHORT TERM GOALS: Target date: 12/08/2021   Patient will be independent with initial HEP.  Baseline: needs Goal status: MET 12/14/21- given 12/17/21- good compliance but cues needed for seated pelvic tilts    LONG TERM GOALS: Target date: 05/25/2022    Patient will be independent with advanced/ongoing HEP to improve outcomes and carryover.  Baseline: needs progression  Goal status: MET 12/29/2021- completes 2x/day  2.  Patient will report 50% improvement in low back pain to improve QOL.  Baseline: 6-25/10 low back pain Goal status: MET  01/05/22- 75% improvement 03/16/22- 100% improvement.   3.  Patient will be able to stand/walk with upright posture without increased LBP.   Baseline: forward flexed posture due to pian Goal status: IN PROGRESS 01/05/22 - able to walk short distances now without pain.   03/16/22- improving, able to stand/walk for  longer periods now before having  to "perch" to relieve back pain.   4.   Patient will report 12% improvement on Oswestry to demonstrate improved functional ability.  Baseline: 38% disability Goal status: IN PROGRESS  01/05/22- 34% 03/16/22- 28%   5.  Patient will tolerate 15 min of standing to perform ADLs. Baseline: 10 minutes before needing to sit Goal status: MET 01/05/22 - still limited to ~ 10 min 03/16/22- reports can stand for 15 minutes now.   6.  Patient will tolerate 15 min walking on treadmill to improve activity tolerance and exercise.  Baseline: 10 min max Goal status: IN PROGRESS 01/05/22- 6 minutes on treadmill  03/16/22- can walk halls for about 15 minutes now, still about 6 min on treadmill.    PLAN: PT FREQUENCY: 1x/week  PT DURATION: 10 weeks to 05/25/2022.   PLANNED INTERVENTIONS: Therapeutic exercises, Therapeutic activity, Neuromuscular re-education, Balance training, Gait training, Patient/Family education, Self Care, Joint mobilization, Dry Needling, Electrical stimulation, Spinal mobilization, Manual therapy, and Re-evaluation.  PLAN FOR NEXT SESSION:  manual therapy/TrDN to lumbar multifidi/glutes/QL.  Continue to progress activity tolerance.    Rennie Natter, PT, DPT  04/27/2022, 3:18 PM

## 2022-05-04 ENCOUNTER — Encounter: Payer: Self-pay | Admitting: Physical Therapy

## 2022-05-04 ENCOUNTER — Ambulatory Visit: Payer: Medicare PPO | Admitting: Physical Therapy

## 2022-05-04 DIAGNOSIS — M5459 Other low back pain: Secondary | ICD-10-CM | POA: Diagnosis not present

## 2022-05-04 DIAGNOSIS — R252 Cramp and spasm: Secondary | ICD-10-CM

## 2022-05-04 DIAGNOSIS — M6281 Muscle weakness (generalized): Secondary | ICD-10-CM

## 2022-05-04 NOTE — Therapy (Signed)
OUTPATIENT PHYSICAL THERAPY TREATMENT     Patient Name: Heidi Lutz MRN: [id number] DOB:05/07/1940, 82 y.o., female Today's Date: 05/04/2022   PT End of Session - 05/04/22 1401     Visit Number 21    Number of Visits 24    Date for PT Re-Evaluation 05/25/22    Authorization Type [employer] M[employer]    Authorization Time Period 10 additional visits to 05/25/2022    Authorization - Number of Visits 10    Progress Note Due on Visit 24    PT Start Time [id number]    PT Stop Time 1435    PT Time Calculation (min) 30 min    Activity Tolerance Patient tolerated treatment well    Behavior During Therapy [facility] for tasks assessed/performed               Past Medical History:  Diagnosis Date   Cancer ([place]) 01/2017   left breast cancer, basal cell and 1 melanoma   Complication of anesthesia    heart rate spikeed in the PACU on her 2nd knee surgery   Environmental allergies    High cholesterol    OAB (overactive bladder) 02/2020   Osteoarthritis    Personal history of radiation therapy    PONV (postoperative nausea and vomiting)    with 1st knee surgery had n/v, none since   Raynaud disease    Raynauds disease-takes procardia   Rheumatoid arthritis ([place])    Past Surgical History:  Procedure Laterality Date   ABDOMINAL HYSTERECTOMY  1972   APPENDECTOMY  1953   BREAST BIOPSY Left 05/19/2020   benign   BREAST LUMPECTOMY Left 2019   BREAST LUMPECTOMY WITH RADIOACTIVE SEED AND SENTINEL LYMPH NODE BIOPSY Left 04/07/2017   Procedure: BREAST LUMPECTOMY WITH RADIOACTIVE SEED AND SENTINEL LYMPH NODE BIOPSY;  Surgeon: Erroll Luna, MD;  Location: [facility];  Service: General;  Laterality: Left;   BUNIONECTOMY  1999   COLONOSCOPY     EYE SURGERY     bil cataract   GANGLION CYST EXCISION  1968   JOINT REPLACEMENT Left 2017    reverse shoulder replacement   REPLACEMENT TOTAL KNEE  2012, 2013   Right and Left   SHOULDER ARTHROSCOPY Left    [place]    Patient Active Problem List   Diagnosis Date Noted   Rheumatoid arthritis ([place])    Raynaud disease    PONV (postoperative nausea and vomiting)    Personal history of radiation therapy    Osteoarthritis    High cholesterol    Environmental allergies    Complication of anesthesia    OAB (overactive bladder) 02/2020   Age-related osteoporosis without current pathological fracture 10/31/2019   History of vertebral compression fracture 07/16/2019   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   Pleuritic chest pain 04/10/2018   GAD (generalized anxiety disorder) 04/25/2017   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   LBBB (left bundle branch block) 03/22/2017   Pre-operative cardiovascular examination 03/22/2017   Mixed hyperlipidemia 03/22/2017   Malignant neoplasm of central portion of left breast in female, estrogen receptor positive ([employer]) 03/16/2017   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/2017   Osteopenia 03/03/2017   Cancer ([place]) 01/2017   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   Lumbar degenerative disc disease 02/27/2015   Lumbar spinal stenosis 02/27/2015   Osteoarthritis of spine with radiculopathy, lumbar region 02/27/2015   Basal cell carcinoma 12/15/2014   Closed compression fracture of  L1 lumbar vertebra, with routine healing, subsequent encounter 11/26/2013   Insomnia 02/02/2010   Hyperlipidemia 04/22/2009   Essential hypertension 09/19/2008   Allergic rhinitis 07/31/2007   IBS (irritable bowel syndrome) 08/18/2006   Raynaud's disease 08/18/2006    PCP: [name], MD  REFERRING PROVIDER: [name], MD  REFERRING DIAG: [phone] (ICD-10-CM) - Lumbar stenosis  Rationale for Evaluation and Treatment Rehabilitation  THERAPY DIAG:  Other low back pain  Cramp and spasm  Muscle weakness (generalized)  ONSET DATE: chronic since age 54  SUBJECTIVE:                                                                                                                                                                                            SUBJECTIVE STATEMENT: Heidi Lutz reports this has been her best week yet.  She was able to do water aerobics and play bridge on the same day without the screaming meemies.  Has also been not using the lidocaine patches as much which is nice since they break her skin out.   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  PAIN:  Are you having pain? Yes: NPRS scale: 1/10 Pain location: low back   Pain description: constant pain also gets "screaming meemees" 25/10 Aggravating factors: pressure/weather changes, walking more than 10 min, prolonged standing >10 min  Relieving factors: sitting down   PRECAUTIONS: None  WEIGHT BEARING RESTRICTIONS No  FALLS:  Has patient fallen in last 6 months? No  LIVING ENVIRONMENT: Lives with: lives with their spouse Lives in: House/apartment Stairs:  3rd floor apartment, lives in [place] Has following equipment at home: Gilford Rile - 2 wheeled  OCCUPATION: retired  PLOF: Independent  PATIENT GOALS improve pain   OBJECTIVE:   DIAGNOSTIC FINDINGS:  04/24/2019 COMPARISON: December 2016. September 2018.    INDICATION: Wedge compression fracture of unspecified thoracic vertebra, initial encounter for closed fracture (#).   TECHNIQUE: MRI SPINE THORACIC WO IV CONTRAST-- Multiplanar, multisequence MR imaging of the thoracic spine performed.   FINDINGS:  Osseous structures: Chronic mild L1 vertebral body compression fracture. Chronic minimal T11 inferior vertebral body compression fracture.  Alignment: Alignment maintained.   Spinal cord: No intrinsic spinal cord abnormality.  Paraspinal tissues: Unremarkable.  Contrast: None given.  Incidental: Trace right pleural effusion. Small seroma left breast.   DISC SPACES:  Incompletely characterized severe  lower cervical spine DDD/facet arthrosis/uncovertebral joint spurring. Moderate left posterior lateral quadrants disc protrusion at T11-T12. Tiny scattered thoracic spine disc bulges/protrusions mostly concentrated in the upper thoracic spine.  PATIENT SURVEYS:  Modified Oswestry 19/50= 38% disability   SCREENING FOR RED FLAGS: Bowel or bladder incontinence: No Spinal tumors: No Cauda equina syndrome: No Compression fracture: Yes: closed L1 compression fracture Abdominal aneurysm: No  COGNITION:  Overall cognitive status: Within functional limits for tasks assessed     SENSATION: No numbness or parasthesias  MUSCLE LENGTH: Hamstrings: Right 90 deg; Left 90 deg  POSTURE: rounded shoulders, forward head, decreased lumbar lordosis, and increased thoracic kyphosis, forward flexed posture   PALPATION: Tenderness throughout lumbar paraspinals, QL, glutes, piriformis bil.  Decreased mobility throughout lumbar spine, decreased lordotic curvature but no scoliosis.   LUMBAR ROM:   Active  AROM  eval  Flexion To knees  Extension To neutral *  Right lateral flexion To knees *  Left lateral flexion To knees *  Right rotation WNL  Left rotation WNL   (Blank rows = not tested)  *increased pain  LOWER EXTREMITY ROM:    good hip mobility bil for IR/ER  LOWER EXTREMITY MMT:    MMT Right* eval Left* eval  Hip flexion 4+ 4+  Hip extension 5 5  Hip abduction 5 5  Hip adduction 5 5  Knee flexion 5 5  Knee extension 5 5  Ankle dorsiflexion 5 5  Ankle plantarflexion 5 5   (Blank rows = not tested) *tested in sitting  LUMBAR SPECIAL TESTS:  Straight leg raise test: Negative and FABER test: Negative  FUNCTIONAL TESTS:  5 times sit to stand: 15  seconds  GAIT: Distance walked: 14' Assistive device utilized: None Level of assistance: Complete Independence Comments: forward flexed posture with gait.      TODAY'S TREATMENT  05/04/22  Manual Therapy: to decrease muscle spasm and pain and improve mobility STM/TPR to bil lumbar paraspinals, R UPA mobs, STM/TPR to bil glutes, piriformis, and QL; skilled palpation and monitoring during dry needling. Trigger Point Dry-Needling  Treatment instructions: Expect mild to moderate muscle soreness. S/S of pneumothorax if dry needled over a lung field, and to seek immediate medical attention should they occur. Patient verbalized understanding of these instructions and education.  Patient Consent Given: Yes Education handout provided: Yes Muscles treated: bil lumbar multifidi L2-L5,bil glut med &  piriformis Electrical stimulation performed: No Parameters: N/A Treatment response/outcome: Twitch Response Elicited and Palpable Increase in Muscle Length  04/27/2022  Manual Therapy: to decrease muscle spasm and pain and improve mobility STM/TPR to bil lumbar paraspinals, R UPA mobs, STM/TPR to bil glutes, piriformis, and QL; skilled palpation and monitoring during dry needling. Trigger Point Dry-Needling  Treatment instructions: Expect mild to moderate muscle soreness. S/S of pneumothorax if dry needled over a lung field, and to seek immediate medical attention should they occur. Patient verbalized understanding of these instructions and education.  Patient Consent Given: Yes Education handout provided: Yes Muscles treated: bil lumbar multifidi L2-L5,bil glut med, &  piriformis Electrical stimulation performed: No Parameters: N/A Treatment response/outcome: Twitch Response Elicited and Palpable Increase in Muscle Length   04/20/2022  Therapeutic Exercise: to improve strength and mobility.  Demo, verbal and tactile cues throughout for technique. Treadmill x 6 min at 2 mph, 3.0 incline Manual  Therapy: to decrease muscle spasm and pain and improve mobility STM/TPR to bil lumbar paraspinals, R UPA mobs, STM/TPR to bil glutes, piriformis, and QL; skilled palpation and monitoring  during dry needling. Trigger Point Dry-Needling  Treatment instructions: Expect mild to moderate muscle soreness. S/S of pneumothorax if dry needled over a lung field, and to seek immediate medical attention should they occur. Patient verbalized understanding of these instructions and education.  Patient Consent Given: Yes Education handout provided: Yes Muscles treated: bil lumbar multifidi L2-L5,bil glut med, &  piriformis Electrical stimulation performed: No Parameters: N/A Treatment response/outcome: Twitch Response Elicited and Palpable Increase in Muscle Length  PATIENT EDUCATION:  Education details: continue HEP as tolerated, Person educated: Patient and Child(ren) Education method: Explanation and Demonstration Education comprehension: verbalized understanding and returned demonstration   HOME EXERCISE PROGRAM: Access Code: [email or web address] URL: [email or web address] Date: 03/23/2022 Prepared by: Glenetta Hew  Exercises - Supine Lower Trunk Rotation  - 1 x daily - 7 x weekly - 2 sets - 10 reps - Supine Posterior Pelvic Tilt  - 1 x daily - 7 x weekly - 2 sets - 10 reps - Seated Posterior Pelvic Tilt  - 1 x daily - 7 x weekly - 2 sets - 10 reps - Supine Bridge  - 1 x daily - 7 x weekly - 2 sets - 10 reps - Supine Single Leg Lift  - 1 x daily - 7 x weekly - 2 sets - 10 reps - Hooklying Clamshell with Resistance  - 1 x daily - 7 x weekly - 2 sets - 10 reps - Supine March  - 1 x daily - 7 x weekly - 3 sets - 10 reps  ASSESSMENT:  CLINICAL IMPRESSION: Heidi Lutz reports continuing to attend water aerobics with good tolerance, working on balance in class as well.  Since she already exercises today again focused on manual therapy followed by [facility] after session while waiting  for husband in waiting room.  No pain reported after interventions.  She is reporting decreasing need for lidocaine patches now.  Heidi Lutz would benefit from skilled physical therapy to decrease low back pain and improve activity tolerance.    OBJECTIVE IMPAIRMENTS decreased activity tolerance, difficulty walking, decreased strength, increased muscle spasms, improper body mechanics, postural dysfunction, and pain.   ACTIVITY LIMITATIONS lifting, standing, stairs, and locomotion level  PARTICIPATION LIMITATIONS: meal prep, cleaning, and community activity  PERSONAL FACTORS Time since onset of injury/illness/exacerbation and 3+ comorbidities: lumbar stenosis, RA, OA, Raynauds, history breast cancer, abdominal hysterectomy  are also affecting patient's functional outcome.   REHAB POTENTIAL: Good  CLINICAL DECISION MAKING: Evolving/moderate complexity  EVALUATION COMPLEXITY: Moderate   GOALS: Goals reviewed with patient? Yes  SHORT TERM GOALS: Target date: 12/08/2021   Patient will be independent with initial HEP.  Baseline: needs Goal status: MET 12/14/21- given 12/17/21- good compliance but cues needed for seated pelvic tilts    LONG TERM GOALS: Target date: 05/25/2022    Patient will be independent with advanced/ongoing HEP to improve outcomes and carryover.  Baseline: needs progression  Goal status: MET 12/29/2021- completes 2x/day  2.  Patient will report 50% improvement in low back pain to improve QOL.  Baseline: 6-25/10 low back pain Goal status: MET  01/05/22- 75% improvement 03/16/22- 100% improvement.   3.  Patient will be able to stand/walk with upright posture without increased LBP.   Baseline: forward flexed posture due to pian Goal status: IN PROGRESS 01/05/22 - able to walk short distances now without pain.   03/16/22- improving, able to stand/walk for longer periods now before having to "perch" to relieve back pain.   4.   Patient will  report 12%  improvement on Oswestry to demonstrate improved functional ability.  Baseline: 38% disability Goal status: IN PROGRESS  01/05/22- 34% 03/16/22- 28%   5.  Patient will tolerate 15 min of standing to perform ADLs. Baseline: 10 minutes before needing to sit Goal status: MET 01/05/22 - still limited to ~ 10 min 03/16/22- reports can stand for 15 minutes now.   6.  Patient will tolerate 15 min walking on treadmill to improve activity tolerance and exercise.  Baseline: 10 min max Goal status: IN PROGRESS 01/05/22- 6 minutes on treadmill  03/16/22- can walk halls for about 15 minutes now, still about 6 min on treadmill.    PLAN: PT FREQUENCY: 1x/week  PT DURATION: 10 weeks to 05/25/2022.   PLANNED INTERVENTIONS: Therapeutic exercises, Therapeutic activity, Neuromuscular re-education, Balance training, Gait training, Patient/Family education, Self Care, Joint mobilization, Dry Needling, Electrical stimulation, Spinal mobilization, Manual therapy, and Re-evaluation.  PLAN FOR NEXT SESSION:  manual therapy/TrDN to lumbar multifidi/glutes/QL.  Continue to progress activity tolerance.    Rennie Natter, PT, DPT  05/04/2022, 3:28 PM

## 2022-05-11 ENCOUNTER — Encounter: Payer: Self-pay | Admitting: Physical Therapy

## 2022-05-11 ENCOUNTER — Ambulatory Visit: Payer: Medicare PPO | Admitting: Physical Therapy

## 2022-05-11 DIAGNOSIS — R252 Cramp and spasm: Secondary | ICD-10-CM

## 2022-05-11 DIAGNOSIS — M6281 Muscle weakness (generalized): Secondary | ICD-10-CM

## 2022-05-11 DIAGNOSIS — M5459 Other low back pain: Secondary | ICD-10-CM

## 2022-05-11 NOTE — Therapy (Signed)
OUTPATIENT PHYSICAL THERAPY TREATMENT     Patient Name: Heidi Lutz MRN: [id number] DOB:[birth year], 82 y.o., female Today's Date: 05/11/2022   PT End of Session - 05/11/22 1402     Visit Number 22    Number of Visits 24    Date for PT Re-Evaluation 05/25/22    Authorization Type [employer] M[employer]    Authorization Time Period 10 additional visits to 05/25/2022    Authorization - Number of Visits 10    Progress Note Due on Visit 24    PT Start Time [id number]    PT Stop Time [id number]    PT Time Calculation (min) 32 min    Activity Tolerance Patient tolerated treatment well    Behavior During Therapy [facility] for tasks assessed/performed               Past Medical History:  Diagnosis Date   Cancer ([place]) 01/2017   left breast cancer, basal cell and 1 melanoma   Complication of anesthesia    heart rate spikeed in the PACU on her 2nd knee surgery   Environmental allergies    High cholesterol    OAB (overactive bladder) 02/2020   Osteoarthritis    Personal history of radiation therapy    PONV (postoperative nausea and vomiting)    with 1st knee surgery had n/v, none since   Raynaud disease    Raynauds disease-takes procardia   Rheumatoid arthritis ([place])    Past Surgical History:  Procedure Laterality Date   ABDOMINAL HYSTERECTOMY  1972   APPENDECTOMY  1953   BREAST BIOPSY Left 05/19/2020   benign   BREAST LUMPECTOMY Left 2019   BREAST LUMPECTOMY WITH RADIOACTIVE SEED AND SENTINEL LYMPH NODE BIOPSY Left 04/07/2017   Procedure: BREAST LUMPECTOMY WITH RADIOACTIVE SEED AND SENTINEL LYMPH NODE BIOPSY;  Surgeon: Erroll Luna, MD;  Location: [place] [facility];  Service: General;  Laterality: Left;   BUNIONECTOMY  1999   COLONOSCOPY     EYE SURGERY     bil cataract   GANGLION CYST EXCISION  1968   JOINT REPLACEMENT Left 2017    reverse shoulder replacement   REPLACEMENT TOTAL KNEE  2012, 2013   Right and Left   SHOULDER ARTHROSCOPY Left    [place]    Patient Active Problem List   Diagnosis Date Noted   Rheumatoid arthritis ([place])    Raynaud disease    PONV (postoperative nausea and vomiting)    Personal history of radiation therapy    Osteoarthritis    High cholesterol    Environmental allergies    Complication of anesthesia    OAB (overactive bladder) 02/2020   Age-related osteoporosis without current pathological fracture 10/31/2019   History of vertebral compression fracture 07/16/2019   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   Pleuritic chest pain 04/10/2018   GAD (generalized anxiety disorder) 04/25/2017   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   LBBB (left bundle branch block) 03/22/2017   Pre-operative cardiovascular examination 03/22/2017   Mixed hyperlipidemia 03/22/2017   Malignant neoplasm of central portion of left breast in female, estrogen receptor positive ([place]) 03/16/2017   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/2017   Osteopenia 03/03/2017   Cancer ([place]) 01/2017   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   Lumbar degenerative disc disease 02/27/2015   Lumbar spinal stenosis 02/27/2015   Osteoarthritis of spine with radiculopathy, lumbar region 02/27/2015   Basal cell carcinoma 12/15/2014   Closed compression fracture of  L1 lumbar vertebra, with routine healing, subsequent encounter 11/26/2013   Insomnia 02/02/2010   Hyperlipidemia 04/22/2009   Essential hypertension 09/19/2008   Allergic rhinitis 07/31/2007   IBS (irritable bowel syndrome) 08/18/2006   Raynaud's disease 08/18/2006    PCP: [name], MD  REFERRING PROVIDER: [name], MD  REFERRING DIAG: [phone] (ICD-10-CM) - Lumbar stenosis  Rationale for Evaluation and Treatment Rehabilitation  THERAPY DIAG:  Other low back pain  Cramp and spasm  Muscle weakness (generalized)  ONSET DATE: chronic since age 52  SUBJECTIVE:                                                                                                                                                                                            SUBJECTIVE STATEMENT: "I'm ready for dry needling!"  PERTINENT HISTORY:  hx of left bundle branch block hypertension hyperlipidemia and a history of breast cancer with lumpectomy on the left adjuvant radiation therapy and tamoxifen therapy; history of compression fracture (L1) and osteroporosis; 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  PAIN:  Are you having pain? Yes: NPRS scale: 2/10 Pain location: low back   Pain description: constant pain also gets "screaming meemees" 25/10 Aggravating factors: pressure/weather changes, walking more than 10 min, prolonged standing >10 min  Relieving factors: sitting down   PRECAUTIONS: None  WEIGHT BEARING RESTRICTIONS No  FALLS:  Has patient fallen in last 6 months? No  LIVING ENVIRONMENT: Lives with: lives with their spouse Lives in: House/apartment Stairs:  3rd floor apartment, lives in [place] Has following equipment at home: Gilford Rile - 2 wheeled  OCCUPATION: retired  PLOF: Independent  PATIENT GOALS improve pain   OBJECTIVE:   DIAGNOSTIC FINDINGS:  04/24/2019 COMPARISON: December 2016. September 2018.    INDICATION: Wedge compression fracture of unspecified thoracic vertebra, initial encounter for closed fracture (#).   TECHNIQUE: MRI SPINE THORACIC WO IV CONTRAST-- Multiplanar, multisequence MR imaging of the thoracic spine performed.   FINDINGS:  Osseous structures: Chronic mild L1 vertebral body compression fracture. Chronic minimal T11 inferior vertebral body compression fracture.  Alignment: Alignment maintained.  Spinal cord: No intrinsic spinal cord abnormality.  Paraspinal tissues: Unremarkable.  Contrast: None given.  Incidental: Trace right pleural effusion. Small seroma left breast.   DISC SPACES:  Incompletely characterized severe  lower cervical spine DDD/facet arthrosis/uncovertebral joint spurring. Moderate left posterior lateral quadrants disc protrusion at T11-T12. Tiny scattered thoracic spine disc bulges/protrusions mostly concentrated in the upper thoracic spine.  PATIENT SURVEYS:  Modified Oswestry 19/50= 38% disability   SCREENING FOR RED FLAGS: Bowel or  bladder incontinence: No Spinal tumors: No Cauda equina syndrome: No Compression fracture: Yes: closed L1 compression fracture Abdominal aneurysm: No  COGNITION:  Overall cognitive status: Within functional limits for tasks assessed     SENSATION: No numbness or parasthesias  MUSCLE LENGTH: Hamstrings: Right 90 deg; Left 90 deg  POSTURE: rounded shoulders, forward head, decreased lumbar lordosis, and increased thoracic kyphosis, forward flexed posture   PALPATION: Tenderness throughout lumbar paraspinals, QL, glutes, piriformis bil.  Decreased mobility throughout lumbar spine, decreased lordotic curvature but no scoliosis.   LUMBAR ROM:   Active  AROM  eval  Flexion To knees  Extension To neutral *  Right lateral flexion To knees *  Left lateral flexion To knees *  Right rotation WNL  Left rotation WNL   (Blank rows = not tested)  *increased pain  LOWER EXTREMITY ROM:    good hip mobility bil for IR/ER  LOWER EXTREMITY MMT:    MMT Right* eval Left* eval  Hip flexion 4+ 4+  Hip extension 5 5  Hip abduction 5 5  Hip adduction 5 5  Knee flexion 5 5  Knee extension 5 5  Ankle dorsiflexion 5 5  Ankle plantarflexion 5 5   (Blank rows = not tested) *tested in sitting  LUMBAR SPECIAL TESTS:  Straight leg raise test: Negative and FABER test: Negative  FUNCTIONAL TESTS:  5 times sit to stand: 15 seconds  GAIT: Distance walked: 83' Assistive device utilized: None Level of assistance: Complete Independence Comments: forward flexed posture with gait.      TODAY'S TREATMENT  05/11/22  Manual Therapy: to decrease muscle spasm and  pain and improve mobility STM/TPR to bil lumbar paraspinals, R UPA mobs, STM/TPR to bil glutes, piriformis, and QL; skilled palpation and monitoring during dry needling. [place] Point Dry-Needling  Treatment instructions: Expect mild to moderate muscle soreness. S/S of pneumothorax if dry needled over a lung field, and to seek immediate medical attention should they occur. Patient verbalized understanding of these instructions and education.  Patient Consent Given: Yes Education handout provided: Yes Muscles treated: bil lumbar multifidi L2-L5,bil glut med &  piriformis Electrical stimulation performed: No Parameters: N/A Treatment response/outcome: Twitch Response Elicited and Palpable Increase in Muscle Length  05/04/22  Manual Therapy: to decrease muscle spasm and pain and improve mobility STM/TPR to bil lumbar paraspinals, R UPA mobs, STM/TPR to bil glutes, piriformis, and QL; skilled palpation and monitoring during dry needling. [place] Dry-Needling  Treatment instructions: Expect mild to moderate muscle soreness. S/S of pneumothorax if dry needled over a lung field, and to seek immediate medical attention should they occur. Patient verbalized understanding of these instructions and education.  Patient Consent Given: Yes Education handout provided: Yes Muscles treated: bil lumbar multifidi L2-L5,bil glut med &  piriformis Electrical stimulation performed: No Parameters: N/A Treatment response/outcome: Twitch Response Elicited and Palpable Increase in Muscle Length  04/27/2022  Manual Therapy: to decrease muscle spasm and pain and improve mobility STM/TPR to bil lumbar paraspinals, R UPA mobs, STM/TPR to bil glutes, piriformis, and QL; skilled palpation and monitoring during dry needling. [place] Dry-Needling  Treatment instructions: Expect mild to moderate muscle soreness. S/S of pneumothorax if dry needled over a lung field, and to seek immediate medical attention should  they occur. Patient verbalized understanding of these instructions and education.  Patient Consent Given: Yes Education handout provided: Yes Muscles treated: bil lumbar multifidi L2-L5,bil glut med, &  piriformis Electrical stimulation performed: No Parameters: N/A Treatment response/outcome: Twitch Response  Elicited and Palpable Increase in Muscle Length   PATIENT EDUCATION:  Education details: continue HEP as tolerated, Person educated: Patient and Child(ren) Education method: Customer service manager Education comprehension: verbalized understanding and returned demonstration   HOME EXERCISE PROGRAM: Access Code: [email or web address] URL: [email or web address] Date: 03/23/2022 Prepared by: Glenetta Hew  Exercises - Supine Lower Trunk Rotation  - 1 x daily - 7 x weekly - 2 sets - 10 reps - Supine Posterior Pelvic Tilt  - 1 x daily - 7 x weekly - 2 sets - 10 reps - Seated Posterior Pelvic Tilt  - 1 x daily - 7 x weekly - 2 sets - 10 reps - Supine Bridge  - 1 x daily - 7 x weekly - 2 sets - 10 reps - Supine Single Leg Lift  - 1 x daily - 7 x weekly - 2 sets - 10 reps - Hooklying Clamshell with Resistance  - 1 x daily - 7 x weekly - 2 sets - 10 reps - Supine March  - 1 x daily - 7 x weekly - 3 sets - 10 reps  ASSESSMENT:  CLINICAL IMPRESSION: Focused on manual therapy today to relieve muscle spasm with good response, no pain after interventions.   Heidi Lutz would benefit from skilled physical therapy to decrease low back pain and improve activity tolerance.    OBJECTIVE IMPAIRMENTS decreased activity tolerance, difficulty walking, decreased strength, increased muscle spasms, improper body mechanics, postural dysfunction, and pain.   ACTIVITY LIMITATIONS lifting, standing, stairs, and locomotion level  PARTICIPATION LIMITATIONS: meal prep, cleaning, and community activity  PERSONAL FACTORS Time since onset of injury/illness/exacerbation and 3+  comorbidities: lumbar stenosis, RA, OA, Raynauds, history breast cancer, abdominal hysterectomy  are also affecting patient's functional outcome.   REHAB POTENTIAL: Good  CLINICAL DECISION MAKING: Evolving/moderate complexity  EVALUATION COMPLEXITY: Moderate   GOALS: Goals reviewed with patient? Yes  SHORT TERM GOALS: Target date: 12/08/2021   Patient will be independent with initial HEP.  Baseline: needs Goal status: MET 12/14/21- given 12/17/21- good compliance but cues needed for seated pelvic tilts    LONG TERM GOALS: Target date: 05/25/2022    Patient will be independent with advanced/ongoing HEP to improve outcomes and carryover.  Baseline: needs progression  Goal status: MET 12/29/2021- completes 2x/day  2.  Patient will report 50% improvement in low back pain to improve QOL.  Baseline: 6-25/10 low back pain Goal status: MET  01/05/22- 75% improvement 03/16/22- 100% improvement.   3.  Patient will be able to stand/walk with upright posture without increased LBP.   Baseline: forward flexed posture due to pian Goal status: IN PROGRESS 01/05/22 - able to walk short distances now without pain.   03/16/22- improving, able to stand/walk for longer periods now before having to "perch" to relieve back pain.   4.   Patient will report 12% improvement on Oswestry to demonstrate improved functional ability.  Baseline: 38% disability Goal status: IN PROGRESS  01/05/22- 34% 03/16/22- 28%   5.  Patient will tolerate 15 min of standing to perform ADLs. Baseline: 10 minutes before needing to sit Goal status: MET 01/05/22 - still limited to ~ 10 min 03/16/22- reports can stand for 15 minutes now.   6.  Patient will tolerate 15 min walking on treadmill to improve activity tolerance and exercise.  Baseline: 10 min max Goal status: IN PROGRESS 01/05/22- 6 minutes on treadmill  03/16/22- can walk halls for about 15 minutes now, still about 6 min on treadmill.  PLAN: PT FREQUENCY:  1x/week  PT DURATION: 10 weeks to 05/25/2022.   PLANNED INTERVENTIONS: Therapeutic exercises, Therapeutic activity, Neuromuscular re-education, Balance training, Gait training, Patient/Family education, Self Care, Joint mobilization, Dry Needling, Electrical stimulation, Spinal mobilization, Manual therapy, and Re-evaluation.  PLAN FOR NEXT SESSION:  manual therapy/TrDN to lumbar multifidi/glutes/QL.  Continue to progress activity tolerance.    Rennie Natter, PT, DPT  05/11/2022, 3:19 PM

## 2022-05-18 ENCOUNTER — Ambulatory Visit: Payer: Medicare PPO | Admitting: Physical Therapy

## 2022-05-18 ENCOUNTER — Encounter: Payer: Self-pay | Admitting: Physical Therapy

## 2022-05-18 DIAGNOSIS — M6281 Muscle weakness (generalized): Secondary | ICD-10-CM

## 2022-05-18 DIAGNOSIS — M5459 Other low back pain: Secondary | ICD-10-CM | POA: Diagnosis not present

## 2022-05-18 DIAGNOSIS — R252 Cramp and spasm: Secondary | ICD-10-CM

## 2022-05-18 NOTE — Therapy (Signed)
OUTPATIENT PHYSICAL THERAPY TREATMENT     Patient Name: Heidi Lutz MRN: [id number] DOB:April 17, 1940, 82 y.o., female Today's Date: 05/18/2022   PT End of Session - 05/18/22 1441     Visit Number 23    Number of Visits 24    Date for PT Re-Evaluation 05/25/22    Authorization Type [employer] MCR    Authorization Time Period 10 additional visits to 05/25/2022    Authorization - Number of Visits 10    Progress Note Due on Visit 24    PT Start Time 1405    PT Stop Time 1440    PT Time Calculation (min) 35 min    Activity Tolerance Patient tolerated treatment well    Behavior During Therapy [facility] for tasks assessed/performed               Past Medical History:  Diagnosis Date   Cancer ([place]) 01/2017   left breast cancer, basal cell and 1 melanoma   Complication of anesthesia    heart rate spikeed in the PACU on her 2nd knee surgery   Environmental allergies    High cholesterol    OAB (overactive bladder) 02/2020   Osteoarthritis    Personal history of radiation therapy    PONV (postoperative nausea and vomiting)    with 1st knee surgery had n/v, none since   Raynaud disease    Raynauds disease-takes procardia   Rheumatoid arthritis ([place])    Past Surgical History:  Procedure Laterality Date   ABDOMINAL HYSTERECTOMY  1972   APPENDECTOMY  1953   BREAST BIOPSY Left 05/19/2020   benign   BREAST LUMPECTOMY Left 2019   BREAST LUMPECTOMY WITH RADIOACTIVE SEED AND SENTINEL LYMPH NODE BIOPSY Left 04/07/2017   Procedure: BREAST LUMPECTOMY WITH RADIOACTIVE SEED AND SENTINEL LYMPH NODE BIOPSY;  Surgeon: Erroll Luna, MD;  Location: [facility];  Service: General;  Laterality: Left;   BUNIONECTOMY  1999   COLONOSCOPY     EYE SURGERY     bil cataract   GANGLION CYST EXCISION  1968   JOINT REPLACEMENT Left 2017    reverse shoulder replacement   REPLACEMENT TOTAL KNEE  2012, 2013   Right and Left   SHOULDER ARTHROSCOPY Left    [place]    Patient Active Problem List   Diagnosis Date Noted   Rheumatoid arthritis ([place])    Raynaud disease    PONV (postoperative nausea and vomiting)    Personal history of radiation therapy    Osteoarthritis    High cholesterol    Environmental allergies    Complication of anesthesia    OAB (overactive bladder) 02/2020   Age-related osteoporosis without current pathological fracture 10/31/2019   History of vertebral compression fracture 07/16/2019   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   Pleuritic chest pain 04/10/2018   GAD (generalized anxiety disorder) 04/25/2017   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   LBBB (left bundle branch block) 03/22/2017   Pre-operative cardiovascular examination 03/22/2017   Mixed hyperlipidemia 03/22/2017   Malignant neoplasm of central portion of left breast in female, estrogen receptor positive ([place]) 03/16/2017   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/2017   Osteopenia 03/03/2017   Cancer ([place]) 01/2017   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   Lumbar degenerative disc disease 02/27/2015   Lumbar spinal stenosis 02/27/2015   Osteoarthritis of spine with radiculopathy, lumbar region 02/27/2015   Basal cell carcinoma 12/15/2014   Closed compression fracture of  L1 lumbar vertebra, with routine healing, subsequent encounter 11/26/2013   Insomnia 02/02/2010   Hyperlipidemia 04/22/2009   Essential hypertension 09/19/2008   Allergic rhinitis 07/31/2007   IBS (irritable bowel syndrome) 08/18/2006   Raynaud's disease 08/18/2006    PCP: [name], MD  REFERRING PROVIDER: [name], MD  REFERRING DIAG: [phone] (ICD-10-CM) - Lumbar stenosis  Rationale for Evaluation and Treatment Rehabilitation  THERAPY DIAG:  Other low back pain  Cramp and spasm  Muscle weakness (generalized)  ONSET DATE: chronic since age 8  SUBJECTIVE:                                                                                                                                                                                            SUBJECTIVE STATEMENT: Heidi Lutz reports she did 3k steps in the pool this morning for water aerobics, some pain getting out of the pool but then went away.   Did put lidocaine patches on her back though.   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  PAIN:  Are you having pain? Yes: NPRS scale: 1/10 Pain location: low back   Pain description: constant pain also gets "screaming meemees" 25/10 Aggravating factors: pressure/weather changes, walking more than 10 min, prolonged standing >10 min  Relieving factors: sitting down   PRECAUTIONS: None  WEIGHT BEARING RESTRICTIONS No  FALLS:  Has patient fallen in last 6 months? No  LIVING ENVIRONMENT: Lives with: lives with their spouse Lives in: House/apartment Stairs:  3rd floor apartment, lives in [place] Has following equipment at home: Gilford Rile - 2 wheeled  OCCUPATION: retired  PLOF: Independent  PATIENT GOALS improve pain   OBJECTIVE:   DIAGNOSTIC FINDINGS:  04/24/2019 COMPARISON: December 2016. September 2018.    INDICATION: Wedge compression fracture of unspecified thoracic vertebra, initial encounter for closed fracture (#).   TECHNIQUE: MRI SPINE THORACIC WO IV CONTRAST-- Multiplanar, multisequence MR imaging of the thoracic spine performed.   FINDINGS:  Osseous structures: Chronic mild L1 vertebral body compression fracture. Chronic minimal T11 inferior vertebral body compression fracture.  Alignment: Alignment maintained.  Spinal cord: No intrinsic spinal cord abnormality.  Paraspinal  tissues: Unremarkable.  Contrast: None given.  Incidental: Trace right pleural effusion. Small seroma left breast.   DISC SPACES:  Incompletely characterized severe lower cervical spine DDD/facet arthrosis/uncovertebral joint spurring. Moderate left posterior lateral quadrants disc  protrusion at T11-T12. Tiny scattered thoracic spine disc bulges/protrusions mostly concentrated in the upper thoracic spine.  PATIENT SURVEYS:  Modified Oswestry 19/50= 38% disability   SCREENING FOR RED FLAGS: Bowel or bladder incontinence: No Spinal tumors: No Cauda equina syndrome: No Compression fracture: Yes: closed L1 compression fracture Abdominal aneurysm: No  COGNITION:  Overall cognitive status: Within functional limits for tasks assessed     SENSATION: No numbness or parasthesias  MUSCLE LENGTH: Hamstrings: Right 90 deg; Left 90 deg  POSTURE: rounded shoulders, forward head, decreased lumbar lordosis, and increased thoracic kyphosis, forward flexed posture   PALPATION: Tenderness throughout lumbar paraspinals, QL, glutes, piriformis bil.  Decreased mobility throughout lumbar spine, decreased lordotic curvature but no scoliosis.   LUMBAR ROM:   Active  AROM  eval  Flexion To knees  Extension To neutral *  Right lateral flexion To knees *  Left lateral flexion To knees *  Right rotation WNL  Left rotation WNL   (Blank rows = not tested)  *increased pain  LOWER EXTREMITY ROM:    good hip mobility bil for IR/ER  LOWER EXTREMITY MMT:    MMT Right* eval Left* eval  Hip flexion 4+ 4+  Hip extension 5 5  Hip abduction 5 5  Hip adduction 5 5  Knee flexion 5 5  Knee extension 5 5  Ankle dorsiflexion 5 5  Ankle plantarflexion 5 5   (Blank rows = not tested) *tested in sitting  LUMBAR SPECIAL TESTS:  Straight leg raise test: Negative and FABER test: Negative  FUNCTIONAL TESTS:  5 times sit to stand: 15 seconds  GAIT: Distance walked: 88' Assistive device utilized:  None Level of assistance: Complete Independence Comments: forward flexed posture with gait.      TODAY'S TREATMENT   05/18/22  Manual Therapy: to decrease muscle spasm and pain and improve mobility STM/TPR to bil lumbar paraspinals, R UPA mobs, STM/TPR to bil glutes, piriformis, and QL; skilled palpation and monitoring during dry needling. Trigger Point Dry-Needling  Treatment instructions: Expect mild to moderate muscle soreness. S/S of pneumothorax if dry needled over a lung field, and to seek immediate medical attention should they occur. Patient verbalized understanding of these instructions and education.  Patient Consent Given: Yes Education handout provided: Yes Muscles treated: bil lumbar multifidi L2-L5,bil glut med &  piriformis Electrical stimulation performed: No Parameters: N/A Treatment response/outcome: Twitch Response Elicited and Palpable Increase in Muscle Length  05/11/22  Manual Therapy: to decrease muscle spasm and pain and improve mobility STM/TPR to bil lumbar paraspinals, R UPA mobs, STM/TPR to bil glutes, piriformis, and QL; skilled palpation and monitoring during dry needling. Trigger Point Dry-Needling  Treatment instructions: Expect mild to moderate muscle soreness. S/S of pneumothorax if dry needled over a lung field, and to seek immediate medical attention should they occur. Patient verbalized understanding of these instructions and education.  Patient Consent Given: Yes Education handout provided: Yes Muscles treated: bil lumbar multifidi L2-L5,bil glut med &  piriformis Electrical stimulation performed: No Parameters: N/A Treatment response/outcome: Twitch Response Elicited and Palpable Increase in Muscle Length  05/04/22  Manual Therapy: to decrease muscle spasm and pain and improve mobility STM/TPR to bil lumbar paraspinals, R UPA mobs, STM/TPR to bil glutes, piriformis, and QL; skilled palpation and monitoring during dry needling. Trigger Point  Dry-Needling  Treatment instructions: Expect mild to moderate muscle soreness. S/S of pneumothorax if dry needled over a lung field, and to seek immediate medical attention should they occur. Patient verbalized understanding of  these instructions and education.  Patient Consent Given: Yes Education handout provided: Yes Muscles treated: bil lumbar multifidi L2-L5,bil glut med &  piriformis Electrical stimulation performed: No Parameters: N/A Treatment response/outcome: Twitch Response Elicited and Palpable Increase in Muscle Length  PATIENT EDUCATION:  Education details: continue HEP as tolerated, Person educated: Patient and Child(ren) Education method: Explanation and Demonstration Education comprehension: verbalized understanding and returned demonstration   HOME EXERCISE PROGRAM: Access Code: [email or web address] URL: [email or web address] Date: 03/23/2022 Prepared by: Glenetta Hew  Exercises - Supine Lower Trunk Rotation  - 1 x daily - 7 x weekly - 2 sets - 10 reps - Supine Posterior Pelvic Tilt  - 1 x daily - 7 x weekly - 2 sets - 10 reps - Seated Posterior Pelvic Tilt  - 1 x daily - 7 x weekly - 2 sets - 10 reps - Supine Bridge  - 1 x daily - 7 x weekly - 2 sets - 10 reps - Supine Single Leg Lift  - 1 x daily - 7 x weekly - 2 sets - 10 reps - Hooklying Clamshell with Resistance  - 1 x daily - 7 x weekly - 2 sets - 10 reps - Supine March  - 1 x daily - 7 x weekly - 3 sets - 10 reps  ASSESSMENT:  CLINICAL IMPRESSION: Continued to focus on manual therapy today to relieve muscle spasm with good response, no pain after interventions.  Noted pain patches covering lower back today.  Derrek Monaco would benefit from skilled physical therapy to decrease low back pain and improve activity tolerance.    OBJECTIVE IMPAIRMENTS decreased activity tolerance, difficulty walking, decreased strength, increased muscle spasms, improper body mechanics, postural dysfunction, and  pain.   ACTIVITY LIMITATIONS lifting, standing, stairs, and locomotion level  PARTICIPATION LIMITATIONS: meal prep, cleaning, and community activity  PERSONAL FACTORS Time since onset of injury/illness/exacerbation and 3+ comorbidities: lumbar stenosis, RA, OA, Raynauds, history breast cancer, abdominal hysterectomy  are also affecting patient's functional outcome.   REHAB POTENTIAL: Good  CLINICAL DECISION MAKING: Evolving/moderate complexity  EVALUATION COMPLEXITY: Moderate   GOALS: Goals reviewed with patient? Yes  SHORT TERM GOALS: Target date: 12/08/2021   Patient will be independent with initial HEP.  Baseline: needs Goal status: MET 12/14/21- given 12/17/21- good compliance but cues needed for seated pelvic tilts    LONG TERM GOALS: Target date: 05/25/2022    Patient will be independent with advanced/ongoing HEP to improve outcomes and carryover.  Baseline: needs progression  Goal status: MET 12/29/2021- completes 2x/day  2.  Patient will report 50% improvement in low back pain to improve QOL.  Baseline: 6-25/10 low back pain Goal status: MET  01/05/22- 75% improvement 03/16/22- 100% improvement.   3.  Patient will be able to stand/walk with upright posture without increased LBP.   Baseline: forward flexed posture due to pian Goal status: IN PROGRESS 01/05/22 - able to walk short distances now without pain.   03/16/22- improving, able to stand/walk for longer periods now before having to "perch" to relieve back pain.   4.   Patient will report 12% improvement on Oswestry to demonstrate improved functional ability.  Baseline: 38% disability Goal status: IN PROGRESS  01/05/22- 34% 03/16/22- 28%   5.  Patient will tolerate 15 min of standing to perform ADLs. Baseline: 10 minutes before needing to sit Goal status: MET 01/05/22 - still limited to ~ 10 min 03/16/22- reports can stand for 15 minutes now.   6.  Patient will tolerate  15 min walking on treadmill to improve  activity tolerance and exercise.  Baseline: 10 min max Goal status: IN PROGRESS 01/05/22- 6 minutes on treadmill  03/16/22- can walk halls for about 15 minutes now, still about 6 min on treadmill.    PLAN: PT FREQUENCY: 1x/week  PT DURATION: 10 weeks to 05/25/2022.   PLANNED INTERVENTIONS: Therapeutic exercises, Therapeutic activity, Neuromuscular re-education, Balance training, Gait training, Patient/Family education, Self Care, Joint mobilization, Dry Needling, Electrical stimulation, Spinal mobilization, Manual therapy, and Re-evaluation.  PLAN FOR NEXT SESSION:  manual therapy/TrDN to lumbar multifidi/glutes/QL.  Continue to progress activity tolerance.    Rennie Natter, PT, DPT  05/18/2022, 2:59 PM

## 2022-05-25 ENCOUNTER — Ambulatory Visit: Payer: Medicare PPO | Admitting: Urology

## 2022-05-25 ENCOUNTER — Encounter: Payer: Self-pay | Admitting: Urology

## 2022-05-25 ENCOUNTER — Encounter: Payer: Medicare PPO | Admitting: Physical Therapy

## 2022-05-25 VITALS — BP 138/65 | HR 99 | Ht 62.0 in | Wt 122.0 lb

## 2022-05-25 DIAGNOSIS — Z87440 Personal history of urinary (tract) infections: Secondary | ICD-10-CM | POA: Diagnosis not present

## 2022-05-25 DIAGNOSIS — N3941 Urge incontinence: Secondary | ICD-10-CM

## 2022-05-25 HISTORY — DX: Urge incontinence: N39.41

## 2022-05-25 HISTORY — DX: Personal history of urinary (tract) infections: Z87.440

## 2022-05-25 LAB — URINALYSIS, ROUTINE W REFLEX MICROSCOPIC
Bilirubin, UA: NEGATIVE
Glucose, UA: NEGATIVE
Nitrite, UA: NEGATIVE
Specific Gravity, UA: 1.02 (ref 1.005–1.030)
Urobilinogen, Ur: 0.2 mg/dL (ref 0.2–1.0)
pH, UA: 6 (ref 5.0–7.5)

## 2022-05-25 LAB — MICROSCOPIC EXAMINATION
Cast Type: NONE SEEN
Casts: NONE SEEN /lpf
Crystal Type: NONE SEEN
Crystals: NONE SEEN
Mucus, UA: NONE SEEN
Trichomonas, UA: NONE SEEN
WBC, UA: >30 /hpf — AB (ref 0–5)

## 2022-05-25 MED ORDER — SOLIFENACIN SUCCINATE 10 MG PO TABS: 10.0000 mg | ORAL_TABLET | Freq: Every evening | ORAL | 11 refills | Status: DC

## 2022-05-25 NOTE — Progress Notes (Signed)
Assessment: 1. Urge incontinence   2. History of UTI     Plan: I personally reviewed the patient's chart including provider notes, lab results. I reviewed records from [facility]. D/C daily Cipro. Trial of Vesicare 10 mg in the PM to see if this may improve her nighttime incontinence. D/C vaginal hormone cream pending discussion with Dr. Lindi Adie given her history of breast cancer. Return to office in 3-4 weeks  Chief Complaint:  Chief Complaint  Patient presents with   Urinary Incontinence    History of Present Illness:  Heidi Lutz is a 82 y.o. female who is seen for continued evaluation of her incontinence and history of UTIs. She was previously followed in [facility] and was last seen by me in April 2022. She continues on Vesicare 10 mg daily.  She reports some worsening incontinence, primarily at night.  She also has noted a pressure in the bladder area with sitting and at times while lying down.  No noticeable vaginal bulge.  She is using vaginal hormone cream 2-3 times per week.  She is also taking ciprofloxacin 250 mg daily per her PCP, Dr. Darron Doom.  No recent urine culture results available.  She continues with symptoms of urgency, nocturia, urge incontinence.  No dysuria or gross hematuria.  Urologic history: At her initial visit in January 2022, she reported symptoms of bladder pressure, frequency, urgency, and nocturia x 2.  She also reported occasional dysuria.  She was having urge incontinence and using 3-6 pads per day.  Her last UTI was approximately 6 months prior.  She was not having any UTI symptoms.  No history of fecal incontinence.  No prior medical therapy for her urinary symptoms.  She was given a trial of Myrbetriq and instructed on a bladder diet.  She was subsequently changed to Vesicare 10 mg daily.  At her follow-up in April 2022 she was taking both Vesicare and Myrbetriq.  Her urinary symptoms were improved.  She was not having any  incontinence.  No UTI symptoms.  It was recommended that she discontinue the Myrbetriq and continue Vesicare. She was subsequently seen by Dr. Donne Hazel.  She was started on vaginal hormone cream.  Her primary care doctor had started her on low-dose ciprofloxacin for prophylaxis for UTIs.  Vesicare was working well for her at the time of her last visit in October 2023.  Urine culture results: 1/22 50-100 K E. Coli 2/22 >100 K Enterococcus   Past Medical History:  Past Medical History:  Diagnosis Date   Cancer ([place]) 01/2017   left breast cancer, basal cell and 1 melanoma   Complication of anesthesia    heart rate spikeed in the PACU on her 2nd knee surgery   Environmental allergies    High cholesterol    OAB (overactive bladder) 02/2020   Osteoarthritis    Personal history of radiation therapy    PONV (postoperative nausea and vomiting)    with 1st knee surgery had n/v, none since   Raynaud disease    Raynauds disease-takes procardia   Rheumatoid arthritis ([place])     Past Surgical History:  Past Surgical History:  Procedure Laterality Date   ABDOMINAL HYSTERECTOMY  1972   APPENDECTOMY  1953   BREAST BIOPSY Left 05/19/2020   benign   BREAST LUMPECTOMY Left 2019   BREAST LUMPECTOMY WITH RADIOACTIVE SEED AND SENTINEL LYMPH NODE BIOPSY Left 04/07/2017   Procedure: BREAST LUMPECTOMY WITH RADIOACTIVE SEED AND SENTINEL LYMPH NODE BIOPSY;  Surgeon: Brantley Stage,  Marcello Moores, MD;  Location: [place];  Service: General;  Laterality: Left;   BUNIONECTOMY  1999   COLONOSCOPY     EYE SURGERY     bil cataract   GANGLION CYST EXCISION  1968   JOINT REPLACEMENT Left 2017    reverse shoulder replacement   REPLACEMENT TOTAL KNEE  2012, 2013   Right and Left   SHOULDER ARTHROSCOPY Left    SYMPATHECTOMY  1970   TONSILLECTOMY  1949    Allergies:  Allergies  Allergen Reactions   Fluorouracil Hives   Codeine Nausea And Vomiting   Hydrocodone Nausea And Vomiting   Penicillins Swelling, Rash and Other  (See Comments)    Pt states allergic to all "cillin" drugs. Joint pain. Has patient had a PCN reaction causing immediate rash, facial/tongue/throat swelling, SOB or lightheadedness with hypotension: Yes Has patient had a PCN reaction causing severe rash involving mucus membranes or skin necrosis: No Has patient had a PCN reaction that required hospitalization: No Has patient had a PCN reaction occurring within the last 10 years: No If all of the above answers are "NO", then may proceed with Cephalosporin use.     Family History:  Family History  Adopted: Yes  Problem Relation Age of Onset   Breast cancer Neg Hx     Social History:  Social History   Tobacco Use   Smoking status: Former    Types: Cigarettes    Quit date: 02/22/1963    Years since quitting: 59.2    Passive exposure: Past   Smokeless tobacco: Never  Vaping Use   Vaping Use: Never used  Substance Use Topics   Alcohol use: Yes    Alcohol/week: 14.0 standard drinks of alcohol    Types: 14 Glasses of wine per week    Comment: 2 glasses of wine daily.    Drug use: No    Review of symptoms:  Constitutional:  Negative for unexplained weight loss, night sweats, fever, chills ENT:  Negative for nose bleeds, sinus pain, painful swallowing CV:  Negative for chest pain, shortness of breath, exercise intolerance, palpitations, loss of consciousness Resp:  Negative for cough, wheezing, shortness of breath GI:  Negative for nausea, vomiting, diarrhea, bloody stools GU:  Positives noted in HPI; otherwise negative for gross hematuria, dysuria Neuro:  Negative for seizures, poor balance, limb weakness, slurred speech Psych:  Negative for lack of energy, depression, anxiety Endocrine:  Negative for polydipsia, polyuria, symptoms of hypoglycemia (dizziness, hunger, sweating) Hematologic:  Negative for anemia, purpura, petechia, prolonged or excessive bleeding, use of anticoagulants  Allergic:  Negative for difficulty breathing  or choking as a result of exposure to anything; no shellfish allergy; no allergic response (rash/itch) to materials, foods  Physical exam: BP 138/65   Pulse 99   Ht 5\' 2"  (1.575 m)   Wt 122 lb (55.3 kg)   LMP  (LMP Unknown)   BMI 22.31 kg/m  GENERAL APPEARANCE:  Well appearing, well developed, well nourished, NAD HEENT: Atraumatic, Normocephalic, oropharynx clear. NECK: Supple without lymphadenopathy or thyromegaly. LUNGS: Clear to auscultation bilaterally. HEART: Regular Rate and Rhythm without murmurs, gallops, or rubs. ABDOMEN: Soft, non-tender, No Masses. EXTREMITIES: Moves all extremities well.  Without clubbing, cyanosis, or edema. NEUROLOGIC:  Alert and oriented x 3, normal gait, CN II-XII grossly intact.  MENTAL STATUS:  Appropriate. BACK:  Non-tender to palpation.  No CVAT SKIN:  Warm, dry and intact.    Results: Results for orders placed or performed in visit on 05/25/22 (from  the past 24 hour(s))  Urinalysis, Routine w reflex microscopic     Status: Abnormal   Collection Time: 05/25/22 11:02 AM  Result Value Ref Range   Specific Gravity, UA 1.020 1.005 - 1.030   pH, UA 6.0 5.0 - 7.5   Color, UA Yellow Yellow   Appearance Ur Hazy (A) Clear   Leukocytes,UA 3+ (A) Negative   Protein,UA 2+ (A) Negative/Trace   Glucose, UA Negative Negative   Ketones, UA Trace (A) Negative   RBC, UA 1+ (A) Negative   Bilirubin, UA Negative Negative   Urobilinogen, Ur 0.2 0.2 - 1.0 mg/dL   Nitrite, UA Negative Negative   Microscopic Examination See below:    Narrative   Performed at:  01 - [facility]  [facility] [place], [place], [place]  [id number] Lab Director: Everitt Amber MT, Phone:  [phone]  Microscopic Examination     Status: Abnormal   Collection Time: 05/25/22 11:02 AM   Urine  Result Value Ref Range   WBC, UA >30 (A) 0 - 5 /hpf   RBC, Urine 0-2 0 - 2 /hpf   Epithelial Cells (non renal) 0-10 0 - 10 /hpf   Renal Epithel, UA Present (A)  None seen /hpf   Casts None seen None seen /lpf   Cast Type None seen N/A   Crystals None seen N/A   Crystal Type None seen N/A   Mucus, UA None seen Not Estab.   Bacteria, UA Few (A) None seen/Few   Yeast, UA Present (A) None seen   Trichomonas, UA None seen None seen   Narrative   Performed at:  01 - [facility]  Urology [facility] [place], [place], [place]  [id number] Lab Director: Everitt Amber MT, Phone:  [phone]

## 2022-05-26 ENCOUNTER — Ambulatory Visit
Admission: RE | Admit: 2022-05-26 | Discharge: 2022-05-26 | Disposition: A | Discharge status: Home / Self Care | Payer: Medicare PPO | Source: Ambulatory Visit | Attending: Family Medicine | Admitting: Family Medicine

## 2022-05-26 ENCOUNTER — Encounter: Payer: Self-pay | Admitting: Urology

## 2022-05-26 DIAGNOSIS — Z1231 Encounter for screening mammogram for malignant neoplasm of breast: Secondary | ICD-10-CM

## 2022-06-01 ENCOUNTER — Encounter: Payer: Self-pay | Admitting: Physical Therapy

## 2022-06-01 ENCOUNTER — Ambulatory Visit: Payer: Medicare PPO | Attending: Family Medicine | Admitting: Physical Therapy

## 2022-06-01 DIAGNOSIS — M6281 Muscle weakness (generalized): Secondary | ICD-10-CM | POA: Insufficient documentation

## 2022-06-01 DIAGNOSIS — M5459 Other low back pain: Secondary | ICD-10-CM | POA: Insufficient documentation

## 2022-06-01 DIAGNOSIS — R252 Cramp and spasm: Secondary | ICD-10-CM | POA: Insufficient documentation

## 2022-06-01 NOTE — Therapy (Signed)
OUTPATIENT PHYSICAL THERAPY TREATMENT  Progress Note/Recert Reporting Period 03/16/2022 to 06/01/2022  See note below for Objective Data and Assessment of Progress/Goals.       Patient Name: Heidi Lutz MRN: [id number] DOB:05/05/1940, 82 y.o., female Today's Date: 06/01/2022   PT End of Session - 06/01/22 1745     Visit Number 24    Number of Visits 36    Date for PT Re-Evaluation 08/24/22    Authorization Type [employer] M[employer]    Authorization Time Period requesting reauthorization    Progress Note Due on Visit 30    PT Start Time 1405    PT Stop Time 1440    PT Time Calculation (min) 35 min    Activity Tolerance Patient tolerated treatment well    [facility] for tasks assessed/performed                Past Medical History:  Diagnosis Date   Cancer 01/2017   left breast cancer, basal cell and 1 melanoma   Complication of anesthesia    heart rate spikeed in the PACU on her 2nd knee surgery   Environmental allergies    High cholesterol    OAB (overactive bladder) 02/2020   Osteoarthritis    Personal history of radiation therapy    PONV (postoperative nausea and vomiting)    with 1st knee surgery had n/v, none since   Raynaud disease    Raynauds disease-takes procardia   Rheumatoid arthritis    Past Surgical History:  Procedure Laterality Date   ABDOMINAL HYSTERECTOMY  1972   APPENDECTOMY  1953   BREAST BIOPSY Left 05/19/2020   benign   BREAST LUMPECTOMY Left 2019   BREAST LUMPECTOMY WITH RADIOACTIVE SEED AND SENTINEL LYMPH NODE BIOPSY Left 04/07/2017   Procedure: BREAST LUMPECTOMY WITH RADIOACTIVE SEED AND SENTINEL LYMPH NODE BIOPSY;  Surgeon: Harriette Bouillonornett, Thomas, MD;  Location: [facility] OR;  Service: General;  Laterality: Left;   BUNIONECTOMY  1999   COLONOSCOPY     EYE SURGERY     bil cataract   GANGLION CYST EXCISION  1968   JOINT REPLACEMENT Left 2017    reverse shoulder replacement   REPLACEMENT TOTAL KNEE  2012, 2013   Right and Left    SHOULDER ARTHROSCOPY Left    SYMPATHECTOMY  1970   TONSILLECTOMY  1949   Patient Active Problem List   Diagnosis Date Noted   History of UTI 05/25/2022   Urge incontinence 05/25/2022   Rheumatoid arthritis    Raynaud disease    PONV (postoperative nausea and vomiting)    Personal history of radiation therapy    Osteoarthritis    High cholesterol    Environmental allergies    Complication of anesthesia    OAB (overactive bladder) 02/2020   Age-related osteoporosis without current pathological fracture 10/31/2019   History of vertebral compression fracture 07/16/2019   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   Pleuritic chest pain 04/10/2018   GAD (generalized anxiety disorder) 04/25/2017   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   LBBB (left bundle branch block) 03/22/2017   Pre-operative cardiovascular examination 03/22/2017   Mixed hyperlipidemia 03/22/2017   Malignant neoplasm of central portion of left breast in female, estrogen receptor positive 03/16/2017   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/2017   Osteopenia 03/03/2017   Cancer 01/2017   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   Lumbar degenerative disc disease 02/27/2015   Lumbar spinal stenosis 02/27/2015   Osteoarthritis of spine with  radiculopathy, lumbar region 02/27/2015   Basal cell carcinoma 12/15/2014   Closed compression fracture of L1 lumbar vertebra, with routine healing, subsequent encounter 11/26/2013   Insomnia 02/02/2010   Hyperlipidemia 04/22/2009   Essential hypertension 09/19/2008   Allergic rhinitis 07/31/2007   IBS (irritable bowel syndrome) 08/18/2006   Raynaud's disease 08/18/2006    PCP: Dois Davenport, MD  REFERRING PROVIDER: Dois Davenport, MD  REFERRING DIAG: [phone] (ICD-10-CM) - Lumbar stenosis  Rationale for Evaluation and Treatment Rehabilitation  THERAPY DIAG:  Other low back pain  Cramp and spasm  Muscle weakness  (generalized)  ONSET DATE: chronic since age 64  SUBJECTIVE:                                                                                                                                                                                           SUBJECTIVE STATEMENT: Heidi Lutz reports she did 2k+ steps in the pool this morning for water aerobics, some pain getting out of the pool but then went away.  She is still so happy how much better her back feels with PT.  She hasn't had any "screaming meemies" since February.  Still has to "perch" when walking any long distance.    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  PAIN:  Are you having pain? Yes: NPRS scale: 1/10 Pain location: low back   Pain description: constant pain also gets "screaming meemees" 25/10 Aggravating factors: pressure/weather changes, walking more than 10 min, prolonged standing >10 min  Relieving factors: sitting down   PRECAUTIONS: None  WEIGHT BEARING RESTRICTIONS No  FALLS:  Has patient fallen in last 6 months? No  LIVING ENVIRONMENT: Lives with: lives with their spouse Lives in: House/apartment Stairs:  3rd floor apartment, lives in [place] Has following equipment at home: Dan Humphreys - 2 wheeled  OCCUPATION: retired  PLOF: Independent  PATIENT GOALS improve pain   OBJECTIVE:   DIAGNOSTIC FINDINGS:  04/24/2019 COMPARISON: December 2016. September 2018.    INDICATION: Wedge compression fracture of unspecified thoracic vertebra, initial encounter for closed fracture (#).   TECHNIQUE: MRI SPINE THORACIC WO IV CONTRAST-- Multiplanar, multisequence MR imaging of the thoracic spine performed.    FINDINGS:  Osseous structures: Chronic mild L1 vertebral body compression fracture. Chronic minimal T11 inferior vertebral body compression fracture.  Alignment: Alignment maintained.  Spinal cord: No intrinsic spinal cord  abnormality.  Paraspinal tissues: Unremarkable.  Contrast: None given.  Incidental: Trace right pleural effusion. Small seroma left breast.   DISC SPACES:  Incompletely characterized severe lower cervical spine DDD/facet arthrosis/uncovertebral joint spurring. Moderate left posterior lateral quadrants disc protrusion at T11-T12. Tiny scattered thoracic spine disc bulges/protrusions mostly concentrated in the upper thoracic spine.  PATIENT SURVEYS:  Modified Oswestry 19/50= 38% disability   SCREENING FOR RED FLAGS: Bowel or bladder incontinence: No Spinal tumors: No Cauda equina syndrome: No Compression fracture: Yes: closed L1 compression fracture Abdominal aneurysm: No  COGNITION:  Overall cognitive status: Within functional limits for tasks assessed     SENSATION: No numbness or parasthesias  MUSCLE LENGTH: Hamstrings: Right 90 deg; Left 90 deg  POSTURE: rounded shoulders, forward head, decreased lumbar lordosis, and increased thoracic kyphosis, forward flexed posture   PALPATION: Tenderness throughout lumbar paraspinals, QL, glutes, piriformis bil.  Decreased mobility throughout lumbar spine, decreased lordotic curvature but no scoliosis.   LUMBAR ROM:   Active  AROM  eval  Flexion To knees  Extension To neutral *  Right lateral flexion To knees *  Left lateral flexion To knees *  Right rotation WNL  Left rotation WNL   (Blank rows = not tested)  *increased pain  LOWER EXTREMITY ROM:    good hip mobility bil for IR/ER  LOWER EXTREMITY MMT:    MMT Right* eval Left* eval  Hip flexion 4+ 4+  Hip extension 5 5  Hip abduction 5 5  Hip adduction 5 5  Knee flexion 5 5  Knee extension 5 5  Ankle dorsiflexion 5 5  Ankle plantarflexion 5 5    (Blank rows = not tested) *tested in sitting  LUMBAR SPECIAL TESTS:  Straight leg raise test: Negative and FABER test: Negative  FUNCTIONAL TESTS:  5 times sit to stand: 15 seconds  GAIT: Distance walked: 27' Assistive device utilized: None Level of assistance: Complete Independence Comments: forward flexed posture with gait.      TODAY'S TREATMENT  06/01/22  Manual Therapy: to decrease muscle spasm and pain and improve mobility STM/TPR to bil lumbar paraspinals, R UPA mobs, STM/TPR to bil glutes, piriformis, and QL; skilled palpation and monitoring during dry needling. Trigger Point Dry-Needling  Treatment instructions: Expect mild to moderate muscle soreness. S/S of pneumothorax if dry needled over a lung field, and to seek immediate medical attention should they occur. Patient verbalized understanding of these instructions and education.  Patient Consent Given: Yes Education handout provided: Yes Muscles treated: bil lumbar multifidi L2-L5,bil glut med &  piriformis Electrical stimulation performed: No Parameters: N/A Treatment response/outcome: Twitch Response Elicited and Palpable Increase in Muscle Length  05/18/22  Manual Therapy: to decrease muscle spasm and pain and improve mobility STM/TPR to bil lumbar paraspinals, R UPA mobs, STM/TPR to bil glutes, piriformis, and QL; skilled palpation and monitoring during dry needling. Trigger Point Dry-Needling  Treatment instructions: Expect mild to moderate muscle soreness. S/S of pneumothorax if dry needled over a lung field, and to seek immediate medical attention should they occur. Patient verbalized understanding of these instructions and education.  Patient Consent Given: Yes Education handout provided: Yes Muscles treated: bil lumbar multifidi L2-L5,bil glut med &  piriformis Electrical stimulation performed: No Parameters: N/A Treatment response/outcome: Twitch Response Elicited and Palpable Increase in Muscle  Length  05/11/22  Manual Therapy: to decrease muscle spasm and pain and improve mobility STM/TPR to bil lumbar paraspinals, R UPA mobs, STM/TPR to bil glutes, piriformis, and QL; skilled palpation and  monitoring during dry needling. Trigger Point Dry-Needling  Treatment instructions: Expect mild to moderate muscle soreness. S/S of pneumothorax if dry needled over a lung field, and to seek immediate medical attention should they occur. Patient verbalized understanding of these instructions and education.  Patient Consent Given: Yes Education handout provided: Yes Muscles treated: bil lumbar multifidi L2-L5,bil glut med &  piriformis Electrical stimulation performed: No Parameters: N/A Treatment response/outcome: Twitch Response Elicited and Palpable Increase in Muscle Length   PATIENT EDUCATION:  Education details: continue HEP as tolerated, Person educated: Patient and Child(ren) Education method: Explanation and Demonstration Education comprehension: verbalized understanding and returned demonstration   HOME EXERCISE PROGRAM: Access Code: [email or web address] URL: [email or web address] Date: 03/23/2022 Prepared by: Harrie Foreman  Exercises - Supine Lower Trunk Rotation  - 1 x daily - 7 x weekly - 2 sets - 10 reps - Supine Posterior Pelvic Tilt  - 1 x daily - 7 x weekly - 2 sets - 10 reps - Seated Posterior Pelvic Tilt  - 1 x daily - 7 x weekly - 2 sets - 10 reps - Supine Bridge  - 1 x daily - 7 x weekly - 2 sets - 10 reps - Supine Single Leg Lift  - 1 x daily - 7 x weekly - 2 sets - 10 reps - Hooklying Clamshell with Resistance  - 1 x daily - 7 x weekly - 2 sets - 10 reps - Supine March  - 1 x daily - 7 x weekly - 3 sets - 10 reps  ASSESSMENT:  CLINICAL IMPRESSION: Continued to focus on manual therapy today to relieve muscle spasm with good response, no pain after interventions.  Heidi Lutz reports overall significant improvement in pain with PT interventions.  She strongly  feels that she would like to continue as her QOL has improved significantly.  She still has limitations with prolonged standing and walking and would like to be able to walk again for exercise, but is tolerating and improving endurance with aquatic exercise.  She would benefit from continued skilled therapy of 1x/week decreasing visits as appropriate for additional 12 weeks.    Heidi Milo would benefit from skilled physical therapy to decrease low back pain and improve activity tolerance.    OBJECTIVE IMPAIRMENTS decreased activity tolerance, difficulty walking, decreased strength, increased muscle spasms, improper body mechanics, postural dysfunction, and pain.   ACTIVITY LIMITATIONS lifting, standing, stairs, and locomotion level  PARTICIPATION LIMITATIONS: meal prep, cleaning, and community activity  PERSONAL FACTORS Time since onset of injury/illness/exacerbation and 3+ comorbidities: lumbar stenosis, RA, OA, Raynauds, history breast cancer, abdominal hysterectomy  are also affecting patient's functional outcome.   REHAB POTENTIAL: Good  CLINICAL DECISION MAKING: Evolving/moderate complexity  EVALUATION COMPLEXITY: Moderate   GOALS: Goals reviewed with patient? Yes  SHORT TERM GOALS: Target date: 12/08/2021   Patient will be independent with initial HEP.  Baseline: needs Goal status: MET 12/14/21- given 12/17/21- good compliance but cues needed for seated pelvic tilts    LONG TERM GOALS: Target date: 05/25/2022    Patient will be independent with advanced/ongoing HEP to improve outcomes and carryover.  Baseline: needs progression  Goal status: MET 12/29/2021- completes 2x/day  2.  Patient will report 50% improvement in low back pain to improve QOL.  Baseline: 6-25/10 low back pain Goal status: MET  01/05/22- 75% improvement 03/16/22- 100% improvement.   3.  Patient will be able to stand/walk with upright posture without increased LBP.   Baseline: forward flexed  posture due to pian  Goal status: IN PROGRESS 01/05/22 - able to walk short distances now without pain.   03/16/22- improving, able to stand/walk for longer periods now before having to "perch" to relieve back pain.   4.   Patient will report 12% improvement on Oswestry to demonstrate improved functional ability.  Baseline: 38% disability Goal status: IN PROGRESS  01/05/22- 34% 03/16/22- 28%  06/01/22- 32%  5.  Patient will tolerate 15 min of standing to perform ADLs. Baseline: 10 minutes before needing to sit Goal status: IN PROGRESS 06/01/22 - 5-7 min before needs to sit or "perch" in kitchen with cooking.   6.  Patient will tolerate 15 min walking on treadmill to improve activity tolerance and exercise.  Baseline: 10 min max Goal status: IN PROGRESS 01/05/22- 6 minutes on treadmill  03/16/22- can walk halls for about 15 minutes now, still about 6 min on treadmill.  06/01/22 exercising regularly in pool again, but still limited with distance with walking.  Goal is 3000 steps per day.        PLAN: PT FREQUENCY: 1x/week decreasing as appropriate.    PT DURATION: 12 weeks to 08/24/22  PLANNED INTERVENTIONS: Therapeutic exercises, Therapeutic activity, Neuromuscular re-education, Balance training, Gait training, Patient/Family education, Self Care, Joint mobilization, Dry Needling, Electrical stimulation, Spinal mobilization, Manual therapy, and Re-evaluation.  PLAN FOR NEXT SESSION:  manual therapy/TrDN to lumbar multifidi/glutes/QL.  Continue to progress activity tolerance.    Jena Gauss, PT, DPT  06/01/2022, 5:53 PM

## 2022-06-08 ENCOUNTER — Ambulatory Visit: Payer: Medicare PPO | Admitting: Physical Therapy

## 2022-06-17 ENCOUNTER — Other Ambulatory Visit: Payer: Self-pay

## 2022-06-17 ENCOUNTER — Other Ambulatory Visit: Payer: Medicare PPO

## 2022-06-17 DIAGNOSIS — Z87440 Personal history of urinary (tract) infections: Secondary | ICD-10-CM

## 2022-06-20 LAB — URINE CULTURE

## 2022-06-22 ENCOUNTER — Ambulatory Visit: Payer: Medicare PPO | Admitting: Urology

## 2022-06-22 ENCOUNTER — Ambulatory Visit: Payer: Medicare PPO | Attending: Family Medicine | Admitting: Physical Therapy

## 2022-06-22 ENCOUNTER — Encounter: Payer: Self-pay | Admitting: Physical Therapy

## 2022-06-22 DIAGNOSIS — M6281 Muscle weakness (generalized): Secondary | ICD-10-CM | POA: Diagnosis present

## 2022-06-22 DIAGNOSIS — R252 Cramp and spasm: Secondary | ICD-10-CM | POA: Diagnosis present

## 2022-06-22 DIAGNOSIS — M5459 Other low back pain: Secondary | ICD-10-CM | POA: Diagnosis present

## 2022-06-22 NOTE — Therapy (Signed)
OUTPATIENT PHYSICAL THERAPY TREATMENT     Patient Name: Heidi Lutz MRN: [id number] DOB:December 31, 1940, 82 y.o., female Today's Date: 06/22/2022   PT End of Session - 06/22/22 1408     Visit Number 25    Number of Visits 36    Date for PT Re-Evaluation 08/24/22    Authorization Type [employer] M[employer]    Authorization Time Period requesting reauthorization    Progress Note Due on Visit 30    PT Start Time 1405    PT Stop Time 1448    PT Time Calculation (min) 43 min    Activity Tolerance Patient tolerated treatment well    Behavior During Therapy WFL for tasks assessed/performed                Past Medical History:  Diagnosis Date   Cancer (HCC) 01/2017   left breast cancer, basal cell and 1 melanoma   Complication of anesthesia    heart rate spikeed in the PACU on her 2nd knee surgery   Environmental allergies    High cholesterol    OAB (overactive bladder) 02/2020   Osteoarthritis    Personal history of radiation therapy    PONV (postoperative nausea and vomiting)    with 1st knee surgery had n/v, none since   Raynaud disease    Raynauds disease-takes procardia   Rheumatoid arthritis (HCC)    Past Surgical History:  Procedure Laterality Date   ABDOMINAL HYSTERECTOMY  1972   APPENDECTOMY  1953   BREAST BIOPSY Left 05/19/2020   benign   BREAST LUMPECTOMY Left 2019   BREAST LUMPECTOMY WITH RADIOACTIVE SEED AND SENTINEL LYMPH NODE BIOPSY Left 04/07/2017   Procedure: BREAST LUMPECTOMY WITH RADIOACTIVE SEED AND SENTINEL LYMPH NODE BIOPSY;  Surgeon: Harriette Bouillon, MD;  Location: [facility] OR;  Service: General;  Laterality: Left;   BUNIONECTOMY  1999   COLONOSCOPY     EYE SURGERY     bil cataract   GANGLION CYST EXCISION  1968   JOINT REPLACEMENT Left 2017    reverse shoulder replacement   REPLACEMENT TOTAL KNEE  2012, 2013   Right and Left   SHOULDER ARTHROSCOPY Left    SYMPATHECTOMY  1970   TONSILLECTOMY  1949   Patient Active Problem List   Diagnosis Date  Noted   History of UTI 05/25/2022   Urge incontinence 05/25/2022   Rheumatoid arthritis (HCC)    Raynaud disease    PONV (postoperative nausea and vomiting)    Personal history of radiation therapy    Osteoarthritis    High cholesterol    Environmental allergies    Complication of anesthesia    OAB (overactive bladder) 02/2020   Age-related osteoporosis without current pathological fracture 10/31/2019   History of vertebral compression fracture 07/16/2019   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   Pleuritic chest pain 04/10/2018   GAD (generalized anxiety disorder) 04/25/2017   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   LBBB (left bundle branch block) 03/22/2017   Pre-operative cardiovascular examination 03/22/2017   Mixed hyperlipidemia 03/22/2017   Malignant neoplasm of central portion of left breast in female, estrogen receptor positive (HCC) 03/16/2017   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/2017   Osteopenia 03/03/2017   Cancer (HCC) 01/2017   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   Lumbar degenerative disc disease 02/27/2015   Lumbar spinal stenosis 02/27/2015   Osteoarthritis of spine with radiculopathy, lumbar region 02/27/2015   Basal cell carcinoma 12/15/2014   Closed compression fracture of  L1 lumbar vertebra, with routine healing, subsequent encounter 11/26/2013   Insomnia 02/02/2010   Hyperlipidemia 04/22/2009   Essential hypertension 09/19/2008   Allergic rhinitis 07/31/2007   IBS (irritable bowel syndrome) 08/18/2006   Raynaud's disease 08/18/2006    PCP: Dois Davenport, MD  REFERRING PROVIDER: Dois Davenport, MD  REFERRING DIAG: [phone] (ICD-10-CM) - Lumbar stenosis  Rationale for Evaluation and Treatment Rehabilitation  THERAPY DIAG:  Other low back pain  Cramp and spasm  Muscle weakness (generalized)  ONSET DATE: chronic since age 61  SUBJECTIVE:                                                                                                                                                                                            SUBJECTIVE STATEMENT: Heidi Lutz was doing well until laid on the hard table for an MRI for an hour.  That made her pain  like an 110/10.   She hasn't gotten MRI results yet, but brought the imaging report for lumbar Xray.   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  PAIN:  Are you having pain? Yes: NPRS scale: 6/10 Pain location: low back   Pain description: constant pain also gets "screaming meemees" 25/10 Aggravating factors: pressure/weather changes, walking more than 10 min, prolonged standing >10 min  Relieving factors: sitting down   PRECAUTIONS: None  WEIGHT BEARING RESTRICTIONS No  FALLS:  Has patient fallen in last 6 months? No  LIVING ENVIRONMENT: Lives with: lives with their spouse Lives in: House/apartment Stairs:  3rd floor apartment, lives in [place] Has following equipment at home: Dan Humphreys - 2 wheeled  OCCUPATION: retired  PLOF: Independent  PATIENT GOALS improve pain   OBJECTIVE:   DIAGNOSTIC FINDINGS:  04/24/2019 COMPARISON: December 2016. September 2018.    INDICATION: Wedge compression fracture of unspecified thoracic vertebra, initial encounter for closed fracture (#).   TECHNIQUE: MRI SPINE THORACIC WO IV CONTRAST-- Multiplanar, multisequence MR imaging of the thoracic spine performed.   FINDINGS:  Osseous structures: Chronic mild L1 vertebral body compression fracture. Chronic minimal T11 inferior vertebral body compression fracture.  Alignment: Alignment maintained.  Spinal cord: No intrinsic spinal cord  abnormality.  Paraspinal tissues: Unremarkable.  Contrast: None given.  Incidental: Trace right pleural effusion. Small seroma left breast.   DISC SPACES:  Incompletely characterized severe lower cervical spine DDD/facet arthrosis/uncovertebral joint spurring.  Moderate left posterior lateral quadrants disc protrusion at T11-T12. Tiny scattered thoracic spine disc bulges/protrusions mostly concentrated in the upper thoracic spine.  PATIENT SURVEYS:  Modified Oswestry 19/50= 38% disability   SCREENING FOR RED FLAGS: Bowel or bladder incontinence: No Spinal tumors: No Cauda equina syndrome: No Compression fracture: Yes: closed L1 compression fracture Abdominal aneurysm: No  COGNITION:  Overall cognitive status: Within functional limits for tasks assessed     SENSATION: No numbness or parasthesias  MUSCLE LENGTH: Hamstrings: Right 90 deg; Left 90 deg  POSTURE: rounded shoulders, forward head, decreased lumbar lordosis, and increased thoracic kyphosis, forward flexed posture   PALPATION: Tenderness throughout lumbar paraspinals, QL, glutes, piriformis bil.  Decreased mobility throughout lumbar spine, decreased lordotic curvature but no scoliosis.   LUMBAR ROM:   Active  AROM  eval  Flexion To knees  Extension To neutral *  Right lateral flexion To knees *  Left lateral flexion To knees *  Right rotation WNL  Left rotation WNL   (Blank rows = not tested)  *increased pain  LOWER EXTREMITY ROM:    good hip mobility bil for IR/ER  LOWER EXTREMITY MMT:    MMT Right* eval Left* eval  Hip flexion 4+ 4+  Hip extension 5 5  Hip abduction 5 5  Hip adduction 5 5  Knee flexion 5 5  Knee extension 5 5  Ankle dorsiflexion 5 5  Ankle plantarflexion 5 5   (Blank rows = not tested) *tested in sitting  LUMBAR SPECIAL TESTS:  Straight leg raise test: Negative and FABER test: Negative  FUNCTIONAL TESTS:  5 times sit to stand: 15 seconds  GAIT: Distance walked:  63' Assistive device utilized: None Level of assistance: Complete Independence Comments: forward flexed posture with gait.      TODAY'S TREATMENT   06/22/22 Manual Therapy: to decrease muscle spasm and pain and improve mobility STM/TPR to bil lumbar paraspinals, R UPA mobs, STM/TPR to bil glutes, piriformis, and QL; skilled palpation and monitoring during dry needling. Trigger Point Dry-Needling  Treatment instructions: Expect mild to moderate muscle soreness. S/S of pneumothorax if dry needled over a lung field, and to seek immediate medical attention should they occur. Patient verbalized understanding of these instructions and education.  Patient Consent Given: Yes Education handout provided: Yes Muscles treated: bil lumbar multifidi L2-L5,bil glut med &  piriformis Electrical stimulation performed: No Parameters: N/A Treatment response/outcome: Twitch Response Elicited and Palpable Increase in Muscle Length  06/01/22  Manual Therapy: to decrease muscle spasm and pain and improve mobility STM/TPR to bil lumbar paraspinals, R UPA mobs, STM/TPR to bil glutes, piriformis, and QL; skilled palpation and monitoring during dry needling. Trigger Point Dry-Needling  Treatment instructions: Expect mild to moderate muscle soreness. S/S of pneumothorax if dry needled over a lung field, and to seek immediate medical attention should they occur. Patient verbalized understanding of these instructions and education.  Patient Consent Given: Yes Education handout provided: Yes Muscles treated: bil lumbar multifidi L2-L5,bil glut med &  piriformis Electrical stimulation performed: No Parameters: N/A Treatment response/outcome: Twitch Response Elicited and Palpable Increase in Muscle Length  05/18/22  Manual Therapy: to decrease muscle spasm and pain and improve mobility STM/TPR to bil lumbar paraspinals, R UPA mobs, STM/TPR to bil glutes, piriformis, and QL; skilled palpation and monitoring during dry  needling. Trigger Point Dry-Needling  Treatment instructions: Expect mild to moderate muscle soreness. S/S of pneumothorax if dry needled over a lung field, and to seek immediate medical attention should they  occur. Patient verbalized understanding of these instructions and education.  Patient Consent Given: Yes Education handout provided: Yes Muscles treated: bil lumbar multifidi L2-L5,bil glut med &  piriformis Electrical stimulation performed: No Parameters: N/A Treatment response/outcome: Twitch Response Elicited and Palpable Increase in Muscle Length  PATIENT EDUCATION:  Education details: continue HEP as tolerated, Person educated: Patient and Child(ren) Education method: Explanation and Demonstration Education comprehension: verbalized understanding and returned demonstration   HOME EXERCISE PROGRAM: Access Code: [email or web address] URL: [email or web address] Date: 03/23/2022 Prepared by: Harrie Foreman  Exercises - Supine Lower Trunk Rotation  - 1 x daily - 7 x weekly - 2 sets - 10 reps - Supine Posterior Pelvic Tilt  - 1 x daily - 7 x weekly - 2 sets - 10 reps - Seated Posterior Pelvic Tilt  - 1 x daily - 7 x weekly - 2 sets - 10 reps - Supine Bridge  - 1 x daily - 7 x weekly - 2 sets - 10 reps - Supine Single Leg Lift  - 1 x daily - 7 x weekly - 2 sets - 10 reps - Hooklying Clamshell with Resistance  - 1 x daily - 7 x weekly - 2 sets - 10 reps - Supine March  - 1 x daily - 7 x weekly - 3 sets - 10 reps  ASSESSMENT:  CLINICAL IMPRESSION: Jesusita Jocelyn reported increased pain today following laying on hard MRI table in 1 position for prolonged period, and not having PT for 3 weeks.  Continued to focus on manual therapy today to relieve muscle spasm with good response, followed by MHP to low back after session completed.  Murl reported significant decrease in pain after interventions.  We did discuss her lumbar x-ray report, there was no significant new  information and based on the report do not recommend change in interventions since normally her pain has been very well controlled by current treatment plan.  Lumbar MRI report was not available yet in the system. Shellee Milo would benefit from skilled physical therapy to decrease low back pain and improve activity tolerance.    OBJECTIVE IMPAIRMENTS decreased activity tolerance, difficulty walking, decreased strength, increased muscle spasms, improper body mechanics, postural dysfunction, and pain.   ACTIVITY LIMITATIONS lifting, standing, stairs, and locomotion level  PARTICIPATION LIMITATIONS: meal prep, cleaning, and community activity  PERSONAL FACTORS Time since onset of injury/illness/exacerbation and 3+ comorbidities: lumbar stenosis, RA, OA, Raynauds, history breast cancer, abdominal hysterectomy  are also affecting patient's functional outcome.   REHAB POTENTIAL: Good  CLINICAL DECISION MAKING: Evolving/moderate complexity  EVALUATION COMPLEXITY: Moderate   GOALS: Goals reviewed with patient? Yes  SHORT TERM GOALS: Target date: 12/08/2021   Patient will be independent with initial HEP.  Baseline: needs Goal status: MET 12/14/21- given 12/17/21- good compliance but cues needed for seated pelvic tilts    LONG TERM GOALS: Target date: 05/25/2022    Patient will be independent with advanced/ongoing HEP to improve outcomes and carryover.  Baseline: needs progression  Goal status: MET 12/29/2021- completes 2x/day  2.  Patient will report 50% improvement in low back pain to improve QOL.  Baseline: 6-25/10 low back pain Goal status: MET  01/05/22- 75% improvement 03/16/22- 100% improvement.   3.  Patient will be able to stand/walk with upright posture without increased LBP.   Baseline: forward flexed posture due to pian Goal status: IN PROGRESS 01/05/22 - able to walk short distances now without pain.   03/16/22- improving, able to stand/walk for longer periods  now  before having to "perch" to relieve back pain.   4.   Patient will report 12% improvement on Oswestry to demonstrate improved functional ability.  Baseline: 38% disability Goal status: IN PROGRESS  01/05/22- 34% 03/16/22- 28%  06/01/22- 32%  5.  Patient will tolerate 15 min of standing to perform ADLs. Baseline: 10 minutes before needing to sit Goal status: IN PROGRESS 06/01/22 - 5-7 min before needs to sit or "perch" in kitchen with cooking.   6.  Patient will tolerate 15 min walking on treadmill to improve activity tolerance and exercise.  Baseline: 10 min max Goal status: IN PROGRESS 01/05/22- 6 minutes on treadmill  03/16/22- can walk halls for about 15 minutes now, still about 6 min on treadmill.  06/01/22 exercising regularly in pool again, but still limited with distance with walking.  Goal is 3000 steps per day.    PLAN: PT FREQUENCY: 1x/week decreasing as appropriate.    PT DURATION: 12 weeks to 08/24/22  PLANNED INTERVENTIONS: Therapeutic exercises, Therapeutic activity, Neuromuscular re-education, Balance training, Gait training, Patient/Family education, Self Care, Joint mobilization, Dry Needling, Electrical stimulation, Spinal mobilization, Manual therapy, and Re-evaluation.  PLAN FOR NEXT SESSION:  manual therapy/TrDN to lumbar multifidi/glutes/QL.  Continue to progress activity tolerance.    Jena Gauss, PT, DPT  06/22/2022, 6:26 PM

## 2022-06-23 ENCOUNTER — Encounter: Payer: Self-pay | Admitting: Urology

## 2022-06-23 ENCOUNTER — Ambulatory Visit: Payer: Medicare PPO | Admitting: Urology

## 2022-06-23 VITALS — BP 149/74 | HR 106 | Ht 62.5 in | Wt 122.9 lb

## 2022-06-23 DIAGNOSIS — Z87440 Personal history of urinary (tract) infections: Secondary | ICD-10-CM

## 2022-06-23 DIAGNOSIS — N3941 Urge incontinence: Secondary | ICD-10-CM | POA: Diagnosis not present

## 2022-06-23 DIAGNOSIS — R8290 Unspecified abnormal findings in urine: Secondary | ICD-10-CM | POA: Diagnosis not present

## 2022-06-23 NOTE — Progress Notes (Signed)
Assessment: 1. History of UTI   2. Urge incontinence   3. Abnormal urine findings     Plan: Continue Vesicare 10 mg in the PM  Continue vaginal hormone cream  Bladder diet sheet given Res[facility] Mdx urine culture sent today Will call with results and to arrange next visit Consider cystoscopy pending results of culture  Chief Complaint:  Chief Complaint  Patient presents with   Urinary Incontinence    History of Present Illness:  Heidi Lutz is a 82 y.o. female who is seen for continued evaluation of her incontinence and history of UTIs. She was previously followed in [facility] and was last seen by me in April 2022. She continued on Vesicare 10 mg daily.  She reported some worsening incontinence, primarily at night.  She also noted a pressure in the bladder area with sitting and at times while lying down.  No noticeable vaginal bulge.  She continue to use vaginal hormone cream 2-3 times per week.  She was also taking ciprofloxacin 250 mg daily per her PCP, Dr. Hal Hope.  No recent urine culture results available.  She continued with symptoms of urgency, nocturia, urge incontinence.  No dysuria or gross hematuria. Urine culture from 06/20/2022 grew <10K colonies of mixed flora.  She returns today for follow-up.  She continues to have some intermittent dysuria.  She continues with the fairly constant pressure in the bladder area.  This is increased with the need to void.  She is taking the Vesicare at night and has noted an improvement in her incontinence.  Urologic history: At her initial visit in January 2022, she reported symptoms of bladder pressure, frequency, urgency, and nocturia x 2.  She also reported occasional dysuria.  She was having urge incontinence and using 3-6 pads per day.  Her last UTI was approximately 6 months prior.  She was not having any UTI symptoms.  No history of fecal incontinence.  No prior medical therapy for her urinary symptoms.  She was given a  trial of Myrbetriq and instructed on a bladder diet.  She was subsequently changed to Vesicare 10 mg daily.  At her follow-up in April 2022 she was taking both Vesicare and Myrbetriq.  Her urinary symptoms were improved.  She was not having any incontinence.  No UTI symptoms.  It was recommended that she discontinue the Myrbetriq and continue Vesicare. She was subsequently seen by Dr. Gretchen Short.  She was started on vaginal hormone cream.  Her primary care doctor had started her on low-dose ciprofloxacin for prophylaxis for UTIs.  Vesicare was working well for her at the time of her last visit in October 2023.  Urine culture results: 1/22 50-100 K E. Coli 2/22 >100 K Enterococcus  Portions of the above documentation were copied from a prior visit for review purposes only.   Past Medical History:  Past Medical History:  Diagnosis Date   Cancer (HCC) 01/2017   left breast cancer, basal cell and 1 melanoma   Complication of anesthesia    heart rate spikeed in the PACU on her 2nd knee surgery   Environmental allergies    High cholesterol    OAB (overactive bladder) 02/2020   Osteoarthritis    Personal history of radiation therapy    PONV (postoperative nausea and vomiting)    with 1st knee surgery had n/v, none since   Raynaud disease    Raynauds disease-takes procardia   Rheumatoid arthritis (HCC)     Past Surgical History:  Past  Surgical History:  Procedure Laterality Date   ABDOMINAL HYSTERECTOMY  1972   APPENDECTOMY  1953   BREAST BIOPSY Left 05/19/2020   benign   BREAST LUMPECTOMY Left 2019   BREAST LUMPECTOMY WITH RADIOACTIVE SEED AND SENTINEL LYMPH NODE BIOPSY Left 04/07/2017   Procedure: BREAST LUMPECTOMY WITH RADIOACTIVE SEED AND SENTINEL LYMPH NODE BIOPSY;  Surgeon: Harriette Bouillon, MD;  Location: [facility] OR;  Service: General;  Laterality: Left;   BUNIONECTOMY  1999   COLONOSCOPY     EYE SURGERY     bil cataract   GANGLION CYST EXCISION  1968   JOINT REPLACEMENT Left 2017     reverse shoulder replacement   REPLACEMENT TOTAL KNEE  2012, 2013   Right and Left   SHOULDER ARTHROSCOPY Left    SYMPATHECTOMY  1970   TONSILLECTOMY  1949    Allergies:  Allergies  Allergen Reactions   Fluorouracil Hives   Codeine Nausea And Vomiting   Hydrocodone Nausea And Vomiting   Penicillins Swelling, Rash and Other (See Comments)    Pt states allergic to all "cillin" drugs. Joint pain. Has patient had a PCN reaction causing immediate rash, facial/tongue/throat swelling, SOB or lightheadedness with hypotension: Yes Has patient had a PCN reaction causing severe rash involving mucus membranes or skin necrosis: No Has patient had a PCN reaction that required hospitalization: No Has patient had a PCN reaction occurring within the last 10 years: No If all of the above answers are "NO", then may proceed with Cephalosporin use.     Family History:  Family History  Adopted: Yes  Problem Relation Age of Onset   Breast cancer Neg Hx     Social History:  Social History   Tobacco Use   Smoking status: Former    Types: Cigarettes    Quit date: 02/22/1963    Years since quitting: 59.3    Passive exposure: Past   Smokeless tobacco: Never  Vaping Use   Vaping Use: Never used  Substance Use Topics   Alcohol use: Yes    Alcohol/week: 14.0 standard drinks of alcohol    Types: 14 Glasses of wine per week    Comment: 2 glasses of wine daily.    Drug use: No    ROS: Constitutional:  Negative for fever, chills, weight loss CV: Negative for chest pain, previous MI, hypertension Respiratory:  Negative for shortness of breath, wheezing, sleep apnea, frequent cough GI:  Negative for nausea, vomiting, bloody stool, GERD  Physical exam: BP (!) 149/74   Pulse (!) 106   Ht 5' 2.5" (1.588 m)   Wt 122 lb 14.4 oz (55.7 kg)   LMP  (LMP Unknown)   BMI 22.12 kg/m  GENERAL APPEARANCE:  Well appearing, well developed, well nourished, NAD HEENT:  Atraumatic, normocephalic,  oropharynx clear NECK:  Supple without lymphadenopathy or thyromegaly ABDOMEN:  Soft, non-tender, no masses EXTREMITIES:  Moves all extremities well, without clubbing, cyanosis, or edema NEUROLOGIC:  Alert and oriented x 3, normal gait, CN II-XII grossly intact MENTAL STATUS:  appropriate BACK:  Non-tender to palpation, No CVAT SKIN:  Warm, dry, and intact  Results: U/A:  >30 WBC, 3-10 RBC, many bacteria

## 2022-06-24 LAB — MICROSCOPIC EXAMINATION
Cast Type: NONE SEEN
Casts: NONE SEEN /lpf
Crystal Type: NONE SEEN
Crystals: NONE SEEN
Mucus, UA: NONE SEEN
Trichomonas, UA: NONE SEEN
WBC, UA: >30 /hpf — AB (ref 0–5)
Yeast, UA: NONE SEEN

## 2022-06-24 LAB — URINALYSIS, ROUTINE W REFLEX MICROSCOPIC
Bilirubin, UA: NEGATIVE
Glucose, UA: NEGATIVE
Ketones, UA: NEGATIVE
Nitrite, UA: NEGATIVE
Specific Gravity, UA: 1.015 (ref 1.005–1.030)
Urobilinogen, Ur: 0.2 mg/dL (ref 0.2–1.0)
pH, UA: 7.5 (ref 5.0–7.5)

## 2022-06-27 ENCOUNTER — Other Ambulatory Visit: Payer: Self-pay | Admitting: Urology

## 2022-06-27 ENCOUNTER — Encounter: Payer: Self-pay | Admitting: Urology

## 2022-06-27 DIAGNOSIS — R8290 Unspecified abnormal findings in urine: Secondary | ICD-10-CM

## 2022-06-27 MED ORDER — NITROFURANTOIN MONOHYD MACRO 100 MG PO CAPS
100.0000 mg | ORAL_CAPSULE | Freq: Two times a day (BID) | ORAL | 0 refills | Status: DC
Start: 2022-06-27 — End: 2022-07-19

## 2022-06-29 ENCOUNTER — Ambulatory Visit: Payer: Medicare PPO | Admitting: Physical Therapy

## 2022-06-29 ENCOUNTER — Encounter: Payer: Self-pay | Admitting: Physical Therapy

## 2022-06-29 DIAGNOSIS — R252 Cramp and spasm: Secondary | ICD-10-CM

## 2022-06-29 DIAGNOSIS — M6281 Muscle weakness (generalized): Secondary | ICD-10-CM

## 2022-06-29 DIAGNOSIS — M5459 Other low back pain: Secondary | ICD-10-CM | POA: Diagnosis not present

## 2022-06-29 NOTE — Therapy (Signed)
OUTPATIENT PHYSICAL THERAPY TREATMENT     Patient Name: Heidi Lutz MRN: [id number] DOB:Jul 04, 1940, 82 y.o., female Today's Date: 06/29/2022   PT End of Session - 06/29/22 1441     Visit Number 26    Number of Visits 36    Date for PT Re-Evaluation 08/24/22    Authorization Type [employer] MCR    Authorization Time Period 06/01/22-08/24/22    Authorization - Visit Number 3    Authorization - Number of Visits 8    Progress Note Due on Visit 30    PT Start Time 0205    PT Stop Time 0239    PT Time Calculation (min) 34 min    Activity Tolerance Patient tolerated treatment well    Behavior During Therapy [facility] for tasks assessed/performed                 Past Medical History:  Diagnosis Date   Cancer (HCC) 01/2017   left breast cancer, basal cell and 1 melanoma   Complication of anesthesia    heart rate spikeed in the PACU on her 2nd knee surgery   Environmental allergies    High cholesterol    OAB (overactive bladder) 02/2020   Osteoarthritis    Personal history of radiation therapy    PONV (postoperative nausea and vomiting)    with 1st knee surgery had n/v, none since   Raynaud disease    Raynauds disease-takes procardia   Rheumatoid arthritis (HCC)    Past Surgical History:  Procedure Laterality Date   ABDOMINAL HYSTERECTOMY  1972   APPENDECTOMY  1953   BREAST BIOPSY Left 05/19/2020   benign   BREAST LUMPECTOMY Left 2019   BREAST LUMPECTOMY WITH RADIOACTIVE SEED AND SENTINEL LYMPH NODE BIOPSY Left 04/07/2017   Procedure: BREAST LUMPECTOMY WITH RADIOACTIVE SEED AND SENTINEL LYMPH NODE BIOPSY;  Surgeon: Harriette Bouillon, MD;  Location: [facility] OR;  Service: General;  Laterality: Left;   BUNIONECTOMY  1999   COLONOSCOPY     EYE SURGERY     bil cataract   GANGLION CYST EXCISION  1968   JOINT REPLACEMENT Left 2017    reverse shoulder replacement   REPLACEMENT TOTAL KNEE  2012, 2013   Right and Left   SHOULDER ARTHROSCOPY Left    SYMPATHECTOMY  1970    TONSILLECTOMY  1949   Patient Active Problem List   Diagnosis Date Noted   History of UTI 05/25/2022   Urge incontinence 05/25/2022   Rheumatoid arthritis (HCC)    Raynaud disease    PONV (postoperative nausea and vomiting)    Personal history of radiation therapy    Osteoarthritis    High cholesterol    Environmental allergies    Complication of anesthesia    OAB (overactive bladder) 02/2020   Age-related osteoporosis without current pathological fracture 10/31/2019   History of vertebral compression fracture 07/16/2019   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   Pleuritic chest pain 04/10/2018   GAD (generalized anxiety disorder) 04/25/2017   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   LBBB (left bundle branch block) 03/22/2017   Pre-operative cardiovascular examination 03/22/2017   Mixed hyperlipidemia 03/22/2017   Malignant neoplasm of central portion of left breast in female, estrogen receptor positive (HCC) 03/16/2017   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/2017   Osteopenia 03/03/2017   Cancer (HCC) 01/2017   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   Lumbar degenerative disc disease 02/27/2015   Lumbar spinal stenosis 02/27/2015   Osteoarthritis of spine  with radiculopathy, lumbar region 02/27/2015   Basal cell carcinoma 12/15/2014   Closed compression fracture of L1 lumbar vertebra, with routine healing, subsequent encounter 11/26/2013   Insomnia 02/02/2010   Hyperlipidemia 04/22/2009   Essential hypertension 09/19/2008   Allergic rhinitis 07/31/2007   IBS (irritable bowel syndrome) 08/18/2006   Raynaud's disease 08/18/2006    PCP: Dois Davenport, MD  REFERRING PROVIDER: Dois Davenport, MD  REFERRING DIAG: [id number] (ICD-10-CM) - Lumbar stenosis  Rationale for Evaluation and Treatment Rehabilitation  THERAPY DIAG:  Other low back pain  Cramp and spasm  Muscle weakness (generalized)  ONSET DATE: chronic since age  39  SUBJECTIVE:                                                                                                                                                                                           SUBJECTIVE STATEMENT: Tangela Uccello reports some pain on Saturday with change in barametric pressure but doing well today.  Still doing water aerobics.    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  PAIN:  Are you having pain? Yes: NPRS scale: 6/10 Pain location: low back   Pain description: constant pain also gets "screaming meemees" 25/10 Aggravating factors: pressure/weather changes, walking more than 10 min, prolonged standing >10 min  Relieving factors: sitting down   PRECAUTIONS: None  WEIGHT BEARING RESTRICTIONS No  FALLS:  Has patient fallen in last 6 months? No  LIVING ENVIRONMENT: Lives with: lives with their spouse Lives in: House/apartment Stairs:  3rd floor apartment, lives in [place] Has following equipment at home: Dan Humphreys - 2 wheeled  OCCUPATION: retired  PLOF: Independent  PATIENT GOALS improve pain   OBJECTIVE:   DIAGNOSTIC FINDINGS:  04/24/2019 COMPARISON: December 2016. September 2018.    INDICATION: Wedge compression fracture of unspecified thoracic vertebra, initial encounter for closed fracture (#).   TECHNIQUE: MRI SPINE THORACIC WO IV CONTRAST-- Multiplanar, multisequence MR imaging of the thoracic spine performed.   FINDINGS:  Osseous structures: Chronic mild L1 vertebral body compression fracture. Chronic minimal T11 inferior vertebral body compression fracture.  Alignment: Alignment maintained.  Spinal cord: No intrinsic spinal cord  abnormality.  Paraspinal tissues: Unremarkable.  Contrast: None given.  Incidental: Trace right pleural effusion. Small seroma left breast.   DISC SPACES:  Incompletely characterized severe lower cervical spine DDD/facet arthrosis/uncovertebral joint spurring. Moderate left posterior  lateral quadrants disc protrusion at T11-T12. Tiny scattered thoracic spine disc bulges/protrusions mostly concentrated in the upper thoracic spine.  PATIENT SURVEYS:  Modified Oswestry 19/50= 38% disability   SCREENING FOR RED FLAGS: Bowel or bladder incontinence: No Spinal tumors: No Cauda equina syndrome: No Compression fracture: Yes: closed L1 compression fracture Abdominal aneurysm: No  COGNITION:  Overall cognitive status: Within functional limits for tasks assessed     SENSATION: No numbness or parasthesias  MUSCLE LENGTH: Hamstrings: Right 90 deg; Left 90 deg  POSTURE: rounded shoulders, forward head, decreased lumbar lordosis, and increased thoracic kyphosis, forward flexed posture   PALPATION: Tenderness throughout lumbar paraspinals, QL, glutes, piriformis bil.  Decreased mobility throughout lumbar spine, decreased lordotic curvature but no scoliosis.   LUMBAR ROM:   Active  AROM  eval  Flexion To knees  Extension To neutral *  Right lateral flexion To knees *  Left lateral flexion To knees *  Right rotation WNL  Left rotation WNL   (Blank rows = not tested)  *increased pain  LOWER EXTREMITY ROM:    good hip mobility bil for IR/ER  LOWER EXTREMITY MMT:    MMT Right* eval Left* eval  Hip flexion 4+ 4+  Hip extension 5 5  Hip abduction 5 5  Hip adduction 5 5  Knee flexion 5 5  Knee extension 5 5  Ankle dorsiflexion 5 5  Ankle plantarflexion 5 5   (Blank rows = not tested) *tested in sitting  LUMBAR SPECIAL TESTS:  Straight leg raise test: Negative and FABER test: Negative  FUNCTIONAL TESTS:  5 times sit to stand: 15 seconds  GAIT: Distance walked:  26' Assistive device utilized: None Level of assistance: Complete Independence Comments: forward flexed posture with gait.      TODAY'S TREATMENT  06/29/22 Manual Therapy: to decrease muscle spasm and pain and improve mobility STM/TPR to bil lumbar paraspinals, R UPA mobs, STM/TPR to bil glutes, piriformis, and QL; skilled palpation and monitoring during dry needling. Trigger Point Dry-Needling  Treatment instructions: Expect mild to moderate muscle soreness. S/S of pneumothorax if dry needled over a lung field, and to seek immediate medical attention should they occur. Patient verbalized understanding of these instructions and education.  Patient Consent Given: Yes Education handout provided: Yes Muscles treated: bil lumbar multifidi L2-L5,bil glut med &  piriformis Electrical stimulation performed: No Parameters: N/A Treatment response/outcome: Twitch Response Elicited and Palpable Increase in Muscle Length  06/22/22 Manual Therapy: to decrease muscle spasm and pain and improve mobility STM/TPR to bil lumbar paraspinals, R UPA mobs, STM/TPR to bil glutes, piriformis, and QL; skilled palpation and monitoring during dry needling. Trigger Point Dry-Needling  Treatment instructions: Expect mild to moderate muscle soreness. S/S of pneumothorax if dry needled over a lung field, and to seek immediate medical attention should they occur. Patient verbalized understanding of these instructions and education.  Patient Consent Given: Yes Education handout provided: Yes Muscles treated: bil lumbar multifidi L2-L5,bil glut med &  piriformis Electrical stimulation performed: No Parameters: N/A Treatment response/outcome: Twitch Response Elicited and Palpable Increase in Muscle Length  06/01/22  Manual Therapy: to decrease muscle spasm and pain and improve mobility STM/TPR to bil lumbar paraspinals, R UPA mobs, STM/TPR to bil glutes, piriformis, and QL; skilled palpation and monitoring during dry  needling. Trigger Point Dry-Needling  Treatment instructions: Expect mild to moderate muscle soreness. S/S of pneumothorax if dry needled over a lung field, and to seek immediate medical attention should they occur. Patient verbalized understanding of  these instructions and education.  Patient Consent Given: Yes Education handout provided: Yes Muscles treated: bil lumbar multifidi L2-L5,bil glut med &  piriformis Electrical stimulation performed: No Parameters: N/A Treatment response/outcome: Twitch Response Elicited and Palpable Increase in Muscle Length    PATIENT EDUCATION:  Education details: continue HEP as tolerated, Person educated: Patient and Child(ren) Education method: Explanation and Demonstration Education comprehension: verbalized understanding and returned demonstration   HOME EXERCISE PROGRAM: Access Code: [email or web address] URL: [email or web address] Date: 03/23/2022 Prepared by: Harrie Foreman  Exercises - Supine Lower Trunk Rotation  - 1 x daily - 7 x weekly - 2 sets - 10 reps - Supine Posterior Pelvic Tilt  - 1 x daily - 7 x weekly - 2 sets - 10 reps - Seated Posterior Pelvic Tilt  - 1 x daily - 7 x weekly - 2 sets - 10 reps - Supine Bridge  - 1 x daily - 7 x weekly - 2 sets - 10 reps - Supine Single Leg Lift  - 1 x daily - 7 x weekly - 2 sets - 10 reps - Hooklying Clamshell with Resistance  - 1 x daily - 7 x weekly - 2 sets - 10 reps - Supine March  - 1 x daily - 7 x weekly - 3 sets - 10 reps  ASSESSMENT:  CLINICAL IMPRESSION: Marcell Escovedo reports resolution of pain from MRI.  Her pain was well controlled this week except for change in barometric weather.  Tolerated interventions well today, although has a lot of difficulty with transitional movements on and off table.  Shellee Milo would benefit from skilled physical therapy to decrease low back pain and improve activity tolerance.    OBJECTIVE IMPAIRMENTS decreased activity  tolerance, difficulty walking, decreased strength, increased muscle spasms, improper body mechanics, postural dysfunction, and pain.   ACTIVITY LIMITATIONS lifting, standing, stairs, and locomotion level  PARTICIPATION LIMITATIONS: meal prep, cleaning, and community activity  PERSONAL FACTORS Time since onset of injury/illness/exacerbation and 3+ comorbidities: lumbar stenosis, RA, OA, Raynauds, history breast cancer, abdominal hysterectomy  are also affecting patient's functional outcome.   REHAB POTENTIAL: Good  CLINICAL DECISION MAKING: Evolving/moderate complexity  EVALUATION COMPLEXITY: Moderate   GOALS: Goals reviewed with patient? Yes  SHORT TERM GOALS: Target date: 12/08/2021   Patient will be independent with initial HEP.  Baseline: needs Goal status: MET 12/14/21- given 12/17/21- good compliance but cues needed for seated pelvic tilts    LONG TERM GOALS: Target date: 05/25/2022    Patient will be independent with advanced/ongoing HEP to improve outcomes and carryover.  Baseline: needs progression  Goal status: MET 12/29/2021- completes 2x/day  2.  Patient will report 50% improvement in low back pain to improve QOL.  Baseline: 6-25/10 low back pain Goal status: MET  01/05/22- 75% improvement 03/16/22- 100% improvement.   3.  Patient will be able to stand/walk with upright posture without increased LBP.   Baseline: forward flexed posture due to pian Goal status: IN PROGRESS 01/05/22 - able to walk short distances now without pain.   03/16/22- improving, able to stand/walk for longer periods now before having to "perch" to relieve back pain.   4.   Patient will report 12% improvement on Oswestry to demonstrate improved functional ability.  Baseline: 38% disability Goal status: IN PROGRESS  01/05/22- 34% 03/16/22- 28%  06/01/22- 32%  5.  Patient will tolerate 15 min of standing to perform ADLs. Baseline: 10 minutes before needing to sit Goal status: IN PROGRESS 06/01/22 -  5-7 min before needs to sit or "perch" in kitchen with cooking.   6.  Patient will tolerate 15 min walking on treadmill to improve activity tolerance and exercise.  Baseline: 10 min max Goal status: IN PROGRESS 01/05/22- 6 minutes on treadmill  03/16/22- can walk halls for about 15 minutes now, still about 6 min on treadmill.  06/01/22 exercising regularly in pool again, but still limited with distance with walking.  Goal is 3000 steps per day.    PLAN: PT FREQUENCY: 1x/week decreasing as appropriate.    PT DURATION: 12 weeks to 08/24/22  PLANNED INTERVENTIONS: Therapeutic exercises, Therapeutic activity, Neuromuscular re-education, Balance training, Gait training, Patient/Family education, Self Care, Joint mobilization, Dry Needling, Electrical stimulation, Spinal mobilization, Manual therapy, and Re-evaluation.  PLAN FOR NEXT SESSION:  manual therapy/TrDN to lumbar multifidi/glutes/QL.  Continue to progress activity tolerance.    Jena Gauss, PT, DPT  06/29/2022, 2:48 PM

## 2022-07-06 ENCOUNTER — Ambulatory Visit: Payer: Medicare PPO | Admitting: Physical Therapy

## 2022-07-06 ENCOUNTER — Encounter: Payer: Self-pay | Admitting: Physical Therapy

## 2022-07-06 DIAGNOSIS — M6281 Muscle weakness (generalized): Secondary | ICD-10-CM

## 2022-07-06 DIAGNOSIS — M5459 Other low back pain: Secondary | ICD-10-CM

## 2022-07-06 DIAGNOSIS — R252 Cramp and spasm: Secondary | ICD-10-CM

## 2022-07-06 NOTE — Therapy (Signed)
OUTPATIENT PHYSICAL THERAPY TREATMENT     Patient Name: Heidi Lutz MRN: [id number] DOB:[birth year], 82 y.o., female Today's Date: 07/06/2022   PT End of Session - 07/06/22 1436     Visit Number 27    Number of Visits 36    Date for PT Re-Evaluation 08/24/22    Authorization Type [employer] MCR    Authorization Time Period 06/01/22-08/24/22    Authorization - Visit Number 4    Authorization - Number of Visits 8    Progress Note Due on Visit 30    PT Start Time 1404    PT Stop Time 1434    PT Time Calculation (min) 30 min    Activity Tolerance Patient tolerated treatment well    Behavior During Therapy [facility] for tasks assessed/performed                 Past Medical History:  Diagnosis Date   Cancer (HCC) 01/2017   left breast cancer, basal cell and 1 melanoma   Complication of anesthesia    heart rate spikeed in the PACU on her 2nd knee surgery   Environmental allergies    High cholesterol    OAB (overactive bladder) 02/2020   Osteoarthritis    Personal history of radiation therapy    PONV (postoperative nausea and vomiting)    with 1st knee surgery had n/v, none since   Raynaud disease    Raynauds disease-takes procardia   Rheumatoid arthritis (HCC)    Past Surgical History:  Procedure Laterality Date   ABDOMINAL HYSTERECTOMY  1972   APPENDECTOMY  1953   BREAST BIOPSY Left 05/19/2020   benign   BREAST LUMPECTOMY Left 2019   BREAST LUMPECTOMY WITH RADIOACTIVE SEED AND SENTINEL LYMPH NODE BIOPSY Left 04/07/2017   Procedure: BREAST LUMPECTOMY WITH RADIOACTIVE SEED AND SENTINEL LYMPH NODE BIOPSY;  Surgeon: Harriette Bouillon, MD;  Location: [facility] OR;  Service: General;  Laterality: Left;   BUNIONECTOMY  1999   COLONOSCOPY     EYE SURGERY     bil cataract   GANGLION CYST EXCISION  1968   JOINT REPLACEMENT Left 2017    reverse shoulder replacement   REPLACEMENT TOTAL KNEE  2012, 2013   Right and Left   SHOULDER ARTHROSCOPY Left    SYMPATHECTOMY  1970    TONSILLECTOMY  1949   Patient Active Problem List   Diagnosis Date Noted   History of UTI 05/25/2022   Urge incontinence 05/25/2022   Rheumatoid arthritis (HCC)    Raynaud disease    PONV (postoperative nausea and vomiting)    Personal history of radiation therapy    Osteoarthritis    High cholesterol    Environmental allergies    Complication of anesthesia    OAB (overactive bladder) 02/2020   Age-related osteoporosis without current pathological fracture 10/31/2019   History of vertebral compression fracture 07/16/2019   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   Pleuritic chest pain 04/10/2018   GAD (generalized anxiety disorder) 04/25/2017   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   LBBB (left bundle branch block) 03/22/2017   Pre-operative cardiovascular examination 03/22/2017   Mixed hyperlipidemia 03/22/2017   Malignant neoplasm of central portion of left breast in female, estrogen receptor positive (HCC) 03/16/2017   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/2017   Osteopenia 03/03/2017   Cancer (HCC) 01/2017   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   Lumbar degenerative disc disease 02/27/2015   Lumbar spinal stenosis 02/27/2015   Osteoarthritis of spine  with radiculopathy, lumbar region 02/27/2015   Basal cell carcinoma 12/15/2014   Closed compression fracture of L1 lumbar vertebra, with routine healing, subsequent encounter 11/26/2013   Insomnia 02/02/2010   Hyperlipidemia 04/22/2009   Essential hypertension 09/19/2008   Allergic rhinitis 07/31/2007   IBS (irritable bowel syndrome) 08/18/2006   Raynaud's disease 08/18/2006    PCP: Dois Davenport, MD  REFERRING PROVIDER: Dois Davenport, MD  REFERRING DIAG: [phone] (ICD-10-CM) - Lumbar stenosis  Rationale for Evaluation and Treatment Rehabilitation  THERAPY DIAG:  Other low back pain  Cramp and spasm  Muscle weakness (generalized)  ONSET DATE: chronic since age  15  SUBJECTIVE:                                                                                                                                                                                           SUBJECTIVE STATEMENT: [name] reports yesterday was "awful" due to weather but otherwise doing well.  Tired from water aerobics today.    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  PAIN:  Are you having pain? Yes: NPRS scale: 2/10 Pain location: low back   Pain description: constant pain also gets "screaming meemees" 25/10 Aggravating factors: pressure/weather changes, walking more than 10 min, prolonged standing >10 min  Relieving factors: sitting down   PRECAUTIONS: None  WEIGHT BEARING RESTRICTIONS No  FALLS:  Has patient fallen in last 6 months? No  LIVING ENVIRONMENT: Lives with: lives with their spouse Lives in: House/apartment Stairs:  3rd floor apartment, lives in [place] Has following equipment at home: Dan Humphreys - 2 wheeled  OCCUPATION: retired  PLOF: Independent  PATIENT GOALS improve pain   OBJECTIVE:   DIAGNOSTIC FINDINGS:  04/24/2019 COMPARISON: December 2016. September 2018.    INDICATION: Wedge compression fracture of unspecified thoracic vertebra, initial encounter for closed fracture (#).   TECHNIQUE: MRI SPINE THORACIC WO IV CONTRAST-- Multiplanar, multisequence MR imaging of the thoracic spine performed.   FINDINGS:  Osseous structures: Chronic mild L1 vertebral body compression fracture. Chronic minimal T11 inferior vertebral body compression fracture.  Alignment: Alignment maintained.  Spinal cord: No intrinsic spinal cord  abnormality.  Paraspinal tissues: Unremarkable.  Contrast: None given.  Incidental: Trace right pleural effusion. Small seroma left breast.   DISC SPACES:  Incompletely characterized severe lower cervical spine DDD/facet arthrosis/uncovertebral joint spurring. Moderate left posterior lateral quadrants  disc protrusion at T11-T12. Tiny scattered thoracic spine disc bulges/protrusions mostly concentrated in the upper thoracic spine.  PATIENT SURVEYS:  Modified Oswestry 19/50= 38% disability   SCREENING FOR RED FLAGS: Bowel or bladder incontinence: No Spinal tumors: No Cauda equina syndrome: No Compression fracture: Yes: closed L1 compression fracture Abdominal aneurysm: No  COGNITION:  Overall cognitive status: Within functional limits for tasks assessed     SENSATION: No numbness or parasthesias  MUSCLE LENGTH: Hamstrings: Right 90 deg; Left 90 deg  POSTURE: rounded shoulders, forward head, decreased lumbar lordosis, and increased thoracic kyphosis, forward flexed posture   PALPATION: Tenderness throughout lumbar paraspinals, QL, glutes, piriformis bil.  Decreased mobility throughout lumbar spine, decreased lordotic curvature but no scoliosis.   LUMBAR ROM:   Active  AROM  eval  Flexion To knees  Extension To neutral *  Right lateral flexion To knees *  Left lateral flexion To knees *  Right rotation WNL  Left rotation WNL   (Blank rows = not tested)  *increased pain  LOWER EXTREMITY ROM:    good hip mobility bil for IR/ER  LOWER EXTREMITY MMT:    MMT Right* eval Left* eval  Hip flexion 4+ 4+  Hip extension 5 5  Hip abduction 5 5  Hip adduction 5 5  Knee flexion 5 5  Knee extension 5 5  Ankle dorsiflexion 5 5  Ankle plantarflexion 5 5   (Blank rows = not tested) *tested in sitting  LUMBAR SPECIAL TESTS:  Straight leg raise test: Negative and FABER test: Negative  FUNCTIONAL TESTS:  5 times sit to stand: 15 seconds  GAIT: Distance walked:  93' Assistive device utilized: None Level of assistance: Complete Independence Comments: forward flexed posture with gait.      TODAY'S TREATMENT  07/06/2022 Manual Therapy: to decrease muscle spasm and pain and improve mobility STM/TPR to bil lumbar paraspinals, R UPA mobs, STM/TPR to bil glutes, piriformis, and QL; skilled palpation and monitoring during dry needling. Trigger Point Dry-Needling  Treatment instructions: Expect mild to moderate muscle soreness. S/S of pneumothorax if dry needled over a lung field, and to seek immediate medical attention should they occur. Patient verbalized understanding of these instructions and education.  Patient Consent Given: Yes Education handout provided: Yes Muscles treated: bil lumbar multifidi L2-L5,bil glut med &  piriformis Electrical stimulation performed: No Parameters: N/A Treatment response/outcome: Twitch Response Elicited and Palpable Increase in Muscle Length  06/29/22 Manual Therapy: to decrease muscle spasm and pain and improve mobility STM/TPR to bil lumbar paraspinals, R UPA mobs, STM/TPR to bil glutes, piriformis, and QL; skilled palpation and monitoring during dry needling. Trigger Point Dry-Needling  Treatment instructions: Expect mild to moderate muscle soreness. S/S of pneumothorax if dry needled over a lung field, and to seek immediate medical attention should they occur. Patient verbalized understanding of these instructions and education.  Patient Consent Given: Yes Education handout provided: Yes Muscles treated: bil lumbar multifidi L2-L5,bil glut med &  piriformis Electrical stimulation performed: No Parameters: N/A Treatment response/outcome: Twitch Response Elicited and Palpable Increase in Muscle Length  06/22/22 Manual Therapy: to decrease muscle spasm and pain and improve mobility STM/TPR to bil lumbar paraspinals, R UPA mobs, STM/TPR to bil glutes, piriformis, and QL; skilled palpation and monitoring during dry  needling. Trigger Point Dry-Needling  Treatment instructions: Expect mild to moderate muscle soreness. S/S of pneumothorax if dry needled over a lung field, and to seek immediate medical attention should they occur. Patient verbalized understanding of these instructions and  education.  Patient Consent Given: Yes Education handout provided: Yes Muscles treated: bil lumbar multifidi L2-L5,bil glut med &  piriformis Electrical stimulation performed: No Parameters: N/A Treatment response/outcome: Twitch Response Elicited and Palpable Increase in Muscle Length    PATIENT EDUCATION:  Education details: continue HEP as tolerated, Person educated: Patient and Child(ren) Education method: Explanation and Demonstration Education comprehension: verbalized understanding and returned demonstration   HOME EXERCISE PROGRAM: Access Code: [email or web address] URL: [email or web address] Date: 03/23/2022 Prepared by: Harrie Foreman  Exercises - Supine Lower Trunk Rotation  - 1 x daily - 7 x weekly - 2 sets - 10 reps - Supine Posterior Pelvic Tilt  - 1 x daily - 7 x weekly - 2 sets - 10 reps - Seated Posterior Pelvic Tilt  - 1 x daily - 7 x weekly - 2 sets - 10 reps - Supine Bridge  - 1 x daily - 7 x weekly - 2 sets - 10 reps - Supine Single Leg Lift  - 1 x daily - 7 x weekly - 2 sets - 10 reps - Hooklying Clamshell with Resistance  - 1 x daily - 7 x weekly - 2 sets - 10 reps - Supine March  - 1 x daily - 7 x weekly - 3 sets - 10 reps  ASSESSMENT:  CLINICAL IMPRESSION: Ibeth Zenor reports increased pain from weather changes.  Noted more trigger points today, responded well to interventions with no pain afterwards.   Shellee Milo would benefit from skilled physical therapy to decrease low back pain and improve activity tolerance.    OBJECTIVE IMPAIRMENTS decreased activity tolerance, difficulty walking, decreased strength, increased muscle spasms, improper body mechanics,  postural dysfunction, and pain.   ACTIVITY LIMITATIONS lifting, standing, stairs, and locomotion level  PARTICIPATION LIMITATIONS: meal prep, cleaning, and community activity  PERSONAL FACTORS Time since onset of injury/illness/exacerbation and 3+ comorbidities: lumbar stenosis, RA, OA, Raynauds, history breast cancer, abdominal hysterectomy  are also affecting patient's functional outcome.   REHAB POTENTIAL: Good  CLINICAL DECISION MAKING: Evolving/moderate complexity  EVALUATION COMPLEXITY: Moderate   GOALS: Goals reviewed with patient? Yes  SHORT TERM GOALS: Target date: 12/08/2021   Patient will be independent with initial HEP.  Baseline: needs Goal status: MET 12/14/21- given 12/17/21- good compliance but cues needed for seated pelvic tilts    LONG TERM GOALS: Target date: 05/25/2022    Patient will be independent with advanced/ongoing HEP to improve outcomes and carryover.  Baseline: needs progression  Goal status: MET 12/29/2021- completes 2x/day  2.  Patient will report 50% improvement in low back pain to improve QOL.  Baseline: 6-25/10 low back pain Goal status: MET  01/05/22- 75% improvement 03/16/22- 100% improvement.   3.  Patient will be able to stand/walk with upright posture without increased LBP.   Baseline: forward flexed posture due to pian Goal status: IN PROGRESS 01/05/22 - able to walk short distances now without pain.   03/16/22- improving, able to stand/walk for longer periods now before having to "perch" to relieve back pain.   4.   Patient will report 12% improvement on Oswestry to demonstrate improved functional ability.  Baseline: 38% disability Goal status: IN PROGRESS  01/05/22- 34% 03/16/22- 28%  06/01/22- 32%  5.  Patient will tolerate 15 min of standing to perform ADLs. Baseline: 10 minutes before needing to sit Goal status: IN PROGRESS 06/01/22 - 5-7 min before needs to sit or "perch" in kitchen with cooking.   6.  Patient will tolerate 15 min  walking on treadmill to improve activity tolerance and exercise.  Baseline: 10 min max Goal status: IN PROGRESS 01/05/22- 6 minutes on treadmill  03/16/22- can walk halls for about 15 minutes now, still about 6 min on treadmill.  06/01/22 exercising regularly in pool again, but still limited with distance with walking.  Goal is 3000 steps per day.    PLAN: PT FREQUENCY: 1x/week decreasing as appropriate.    PT DURATION: 12 weeks to 08/24/22  PLANNED INTERVENTIONS: Therapeutic exercises, Therapeutic activity, Neuromuscular re-education, Balance training, Gait training, Patient/Family education, Self Care, Joint mobilization, Dry Needling, Electrical stimulation, Spinal mobilization, Manual therapy, and Re-evaluation.  PLAN FOR NEXT SESSION:  manual therapy/TrDN to lumbar multifidi/glutes/QL.  Continue to progress activity tolerance.    Jena Gauss, PT, DPT  07/06/2022, 2:43 PM

## 2022-07-13 ENCOUNTER — Encounter: Payer: Self-pay | Admitting: Physical Therapy

## 2022-07-13 ENCOUNTER — Ambulatory Visit: Payer: Medicare PPO | Admitting: Physical Therapy

## 2022-07-13 DIAGNOSIS — M5459 Other low back pain: Secondary | ICD-10-CM | POA: Diagnosis not present

## 2022-07-13 DIAGNOSIS — R252 Cramp and spasm: Secondary | ICD-10-CM

## 2022-07-13 DIAGNOSIS — M6281 Muscle weakness (generalized): Secondary | ICD-10-CM

## 2022-07-13 DIAGNOSIS — M47812 Spondylosis without myelopathy or radiculopathy, cervical region: Secondary | ICD-10-CM

## 2022-07-13 HISTORY — DX: Spondylosis without myelopathy or radiculopathy, cervical region: M47.812

## 2022-07-13 NOTE — Therapy (Addendum)
OUTPATIENT PHYSICAL THERAPY TREATMENT     Patient Name: Heidi Lutz MRN: [id number] DOB:[birth year], 82 y.o., female Today's Date: 07/13/2022   PT End of Session - 07/13/22 1408     Visit Number 28    Number of Visits 36    Date for PT Re-Evaluation 08/24/22    Authorization Type [employer] MCR    Authorization Time Period 06/01/22-08/24/22    Authorization - Number of Visits 8    Progress Note Due on Visit 30    PT Start Time 1406    PT Stop Time 1436    PT Time Calculation (min) 30 min    Activity Tolerance Patient tolerated treatment well    Behavior During Therapy [facility] for tasks assessed/performed                 Past Medical History:  Diagnosis Date   Cancer (HCC) 01/2017   left breast cancer, basal cell and 1 melanoma   Complication of anesthesia    heart rate spikeed in the PACU on her 2nd knee surgery   Environmental allergies    High cholesterol    OAB (overactive bladder) 02/2020   Osteoarthritis    Personal history of radiation therapy    PONV (postoperative nausea and vomiting)    with 1st knee surgery had n/v, none since   Raynaud disease    Raynauds disease-takes procardia   Rheumatoid arthritis (HCC)    Past Surgical History:  Procedure Laterality Date   ABDOMINAL HYSTERECTOMY  1972   APPENDECTOMY  1953   BREAST BIOPSY Left 05/19/2020   benign   BREAST LUMPECTOMY Left 2019   BREAST LUMPECTOMY WITH RADIOACTIVE SEED AND SENTINEL LYMPH NODE BIOPSY Left 04/07/2017   Procedure: BREAST LUMPECTOMY WITH RADIOACTIVE SEED AND SENTINEL LYMPH NODE BIOPSY;  Surgeon: Harriette Bouillon, MD;  Location: [facility] OR;  Service: General;  Laterality: Left;   BUNIONECTOMY  1999   COLONOSCOPY     EYE SURGERY     bil cataract   GANGLION CYST EXCISION  1968   JOINT REPLACEMENT Left 2017    reverse shoulder replacement   REPLACEMENT TOTAL KNEE  2012, 2013   Right and Left   SHOULDER ARTHROSCOPY Left    SYMPATHECTOMY  1970   TONSILLECTOMY  1949   Patient Active  Problem List   Diagnosis Date Noted   History of UTI 05/25/2022   Urge incontinence 05/25/2022   Rheumatoid arthritis (HCC)    Raynaud disease    PONV (postoperative nausea and vomiting)    Personal history of radiation therapy    Osteoarthritis    High cholesterol    Environmental allergies    Complication of anesthesia    OAB (overactive bladder) 02/2020   Age-related osteoporosis without current pathological fracture 10/31/2019   History of vertebral compression fracture 07/16/2019   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   Pleuritic chest pain 04/10/2018   GAD (generalized anxiety disorder) 04/25/2017   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   LBBB (left bundle branch block) 03/22/2017   Pre-operative cardiovascular examination 03/22/2017   Mixed hyperlipidemia 03/22/2017   Malignant neoplasm of central portion of left breast in female, estrogen receptor positive (HCC) 03/16/2017   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/2017   Osteopenia 03/03/2017   Cancer (HCC) 01/2017   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   Lumbar degenerative disc disease 02/27/2015   Lumbar spinal stenosis 02/27/2015   Osteoarthritis of spine with radiculopathy, lumbar region 02/27/2015   Basal  cell carcinoma 12/15/2014   Closed compression fracture of L1 lumbar vertebra, with routine healing, subsequent encounter 11/26/2013   Insomnia 02/02/2010   Hyperlipidemia 04/22/2009   Essential hypertension 09/19/2008   Allergic rhinitis 07/31/2007   IBS (irritable bowel syndrome) 08/18/2006   Raynaud's disease 08/18/2006    PCP: Dois Davenport, MD  REFERRING PROVIDER: Dois Davenport, MD  REFERRING DIAG: [phone] (ICD-10-CM) - Lumbar stenosis  Rationale for Evaluation and Treatment Rehabilitation  THERAPY DIAG:  Other low back pain  Cramp and spasm  Muscle weakness (generalized)  ONSET DATE: chronic since age 32  SUBJECTIVE:                                                                                                                                                                                            SUBJECTIVE STATEMENT: Heidi Lutz reports back is doing well this week.  Saw orthopedist about her neck, told she could to therapy for it "wonder what that would be like?"  PERTINENT HISTORY:  hx of left bundle branch block hypertension hyperlipidemia and a history of breast cancer with lumpectomy on the left adjuvant radiation therapy and tamoxifen therapy; history of compression fracture (L1) and osteroporosis; 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  PAIN:  Are you having pain? Yes: NPRS scale: 1-2/10 Pain location: low back   Pain description: constant pain also gets "screaming meemees" 25/10 Aggravating factors: pressure/weather changes, walking more than 10 min, prolonged standing >10 min  Relieving factors: sitting down   PRECAUTIONS: None  WEIGHT BEARING RESTRICTIONS No  FALLS:  Has patient fallen in last 6 months? No  LIVING ENVIRONMENT: Lives with: lives with their spouse Lives in: House/apartment Stairs:  3rd floor apartment, lives in [place] Has following equipment at home: Dan Humphreys - 2 wheeled  OCCUPATION: retired  PLOF: Independent  PATIENT GOALS improve pain   OBJECTIVE:   DIAGNOSTIC FINDINGS:  04/24/2019 COMPARISON: December 2016. September 2018.    INDICATION: Wedge compression fracture of unspecified thoracic vertebra, initial encounter for closed fracture (#).   TECHNIQUE: MRI SPINE THORACIC WO IV CONTRAST-- Multiplanar, multisequence MR imaging of the thoracic spine performed.   FINDINGS:  Osseous structures: Chronic mild L1 vertebral body compression fracture. Chronic minimal T11 inferior vertebral body compression fracture.  Alignment: Alignment maintained.  Spinal cord: No intrinsic spinal cord abnormality.   Paraspinal tissues: Unremarkable.  Contrast: None given.  Incidental: Trace right pleural effusion. Small seroma left breast.   DISC SPACES:  Incompletely characterized severe lower cervical spine DDD/facet arthrosis/uncovertebral joint spurring. Moderate left posterior lateral quadrants disc  protrusion at T11-T12. Tiny scattered thoracic spine disc bulges/protrusions mostly concentrated in the upper thoracic spine.  PATIENT SURVEYS:  Modified Oswestry 19/50= 38% disability   SCREENING FOR RED FLAGS: Bowel or bladder incontinence: No Spinal tumors: No Cauda equina syndrome: No Compression fracture: Yes: closed L1 compression fracture Abdominal aneurysm: No  COGNITION:  Overall cognitive status: Within functional limits for tasks assessed     SENSATION: No numbness or parasthesias  MUSCLE LENGTH: Hamstrings: Right 90 deg; Left 90 deg  POSTURE: rounded shoulders, forward head, decreased lumbar lordosis, and increased thoracic kyphosis, forward flexed posture   PALPATION: Tenderness throughout lumbar paraspinals, QL, glutes, piriformis bil.  Decreased mobility throughout lumbar spine, decreased lordotic curvature but no scoliosis.   LUMBAR ROM:   Active  AROM  eval  Flexion To knees  Extension To neutral *  Right lateral flexion To knees *  Left lateral flexion To knees *  Right rotation WNL  Left rotation WNL   (Blank rows = not tested)  *increased pain  LOWER EXTREMITY ROM:    good hip mobility bil for IR/ER  LOWER EXTREMITY MMT:    MMT Right* eval Left* eval  Hip flexion 4+ 4+  Hip extension 5 5  Hip abduction 5 5  Hip adduction 5 5  Knee flexion 5 5  Knee extension 5 5  Ankle dorsiflexion 5 5  Ankle plantarflexion 5 5   (Blank rows = not tested) *tested in sitting  LUMBAR SPECIAL TESTS:  Straight leg raise test: Negative and FABER test: Negative  FUNCTIONAL TESTS:  5 times sit to stand: 15 seconds  GAIT: Distance walked: 30' Assistive device  utilized: None Level of assistance: Complete Independence Comments: forward flexed posture with gait.      TODAY'S TREATMENT  07/13/22 Manual Therapy: to decrease muscle spasm and pain and improve mobility STM/TPR to bil lumbar paraspinals, R UPA mobs, STM/TPR to bil glutes, piriformis, and QL; skilled palpation and monitoring during dry needling. Trigger Point Dry-Needling  Treatment instructions: Expect mild to moderate muscle soreness. S/S of pneumothorax if dry needled over a lung field, and to seek immediate medical attention should they occur. Patient verbalized understanding of these instructions and education.  Patient Consent Given: Yes Education handout provided: Yes Muscles treated: bil lumbar multifidi L2-L5,bil glut med &  piriformis Electrical stimulation performed: No Parameters: N/A Treatment response/outcome: Twitch Response Elicited and Palpable Increase in Muscle Length  07/06/2022 Manual Therapy: to decrease muscle spasm and pain and improve mobility STM/TPR to bil lumbar paraspinals, R UPA mobs, STM/TPR to bil glutes, piriformis, and QL; skilled palpation and monitoring during dry needling. Trigger Point Dry-Needling  Treatment instructions: Expect mild to moderate muscle soreness. S/S of pneumothorax if dry needled over a lung field, and to seek immediate medical attention should they occur. Patient verbalized understanding of these instructions and education.  Patient Consent Given: Yes Education handout provided: Yes Muscles treated: bil lumbar multifidi L2-L5,bil glut med &  piriformis Electrical stimulation performed: No Parameters: N/A Treatment response/outcome: Twitch Response Elicited and Palpable Increase in Muscle Length  06/29/22 Manual Therapy: to decrease muscle spasm and pain and improve mobility STM/TPR to bil lumbar paraspinals, R UPA mobs, STM/TPR to bil glutes, piriformis, and QL; skilled palpation and monitoring during dry needling. Trigger Point  Dry-Needling  Treatment instructions: Expect mild to moderate muscle soreness. S/S of pneumothorax if dry needled over a lung field, and to seek immediate medical attention should they occur. Patient verbalized understanding of these instructions and education.  Patient Consent Given: Yes Education handout provided: Yes Muscles treated: bil lumbar multifidi L2-L5,bil glut med &  piriformis Electrical stimulation performed: No Parameters: N/A Treatment response/outcome: Twitch Response Elicited and Palpable Increase in Muscle Length   PATIENT EDUCATION:  Education details: continue HEP as tolerated, Person educated: Patient and Child(ren) Education method: Explanation and Demonstration Education comprehension: verbalized understanding and returned demonstration   HOME EXERCISE PROGRAM: Access Code: [email or web address] URL: [email or web address] Date: 03/23/2022 Prepared by: Harrie Foreman  Exercises - Supine Lower Trunk Rotation  - 1 x daily - 7 x weekly - 2 sets - 10 reps - Supine Posterior Pelvic Tilt  - 1 x daily - 7 x weekly - 2 sets - 10 reps - Seated Posterior Pelvic Tilt  - 1 x daily - 7 x weekly - 2 sets - 10 reps - Supine Bridge  - 1 x daily - 7 x weekly - 2 sets - 10 reps - Supine Single Leg Lift  - 1 x daily - 7 x weekly - 2 sets - 10 reps - Hooklying Clamshell with Resistance  - 1 x daily - 7 x weekly - 2 sets - 10 reps - Supine March  - 1 x daily - 7 x weekly - 3 sets - 10 reps  ASSESSMENT:  CLINICAL IMPRESSION: Fatemeh Deveraux reported good pain control this week and had much less irritability in her back today, so discussed trying to decrease visits and going slightly longer periods between sessions.  Also discussed her neck, as she had never complained about her neck pain before, she reports now that her back pain is better controlled she is noticing it more.    Shellee Milo would benefit from skilled physical therapy to decrease low back pain and  improve activity tolerance.    OBJECTIVE IMPAIRMENTS decreased activity tolerance, difficulty walking, decreased strength, increased muscle spasms, improper body mechanics, postural dysfunction, and pain.   ACTIVITY LIMITATIONS lifting, standing, stairs, and locomotion level  PARTICIPATION LIMITATIONS: meal prep, cleaning, and community activity  PERSONAL FACTORS Time since onset of injury/illness/exacerbation and 3+ comorbidities: lumbar stenosis, RA, OA, Raynauds, history breast cancer, abdominal hysterectomy  are also affecting patient's functional outcome.   REHAB POTENTIAL: Good  CLINICAL DECISION MAKING: Evolving/moderate complexity  EVALUATION COMPLEXITY: Moderate   GOALS: Goals reviewed with patient? Yes  SHORT TERM GOALS: Target date: 12/08/2021   Patient will be independent with initial HEP.  Baseline: needs Goal status: MET 12/14/21- given 12/17/21- good compliance but cues needed for seated pelvic tilts    LONG TERM GOALS: Target date: 05/25/2022    Patient will be independent with advanced/ongoing HEP to improve outcomes and carryover.  Baseline: needs progression  Goal status: MET 12/29/2021- completes 2x/day  2.  Patient will report 50% improvement in low back pain to improve QOL.  Baseline: 6-25/10 low back pain Goal status: MET  01/05/22- 75% improvement 03/16/22- 100% improvement.   3.  Patient will be able to stand/walk with upright posture without increased LBP.   Baseline: forward flexed posture due to pian Goal status: IN PROGRESS 01/05/22 - able to walk short distances now without pain.   03/16/22- improving, able to stand/walk for longer periods now before having to "perch" to relieve back pain.   4.   Patient will report 12% improvement on Oswestry to demonstrate improved functional ability.  Baseline: 38% disability Goal status: IN PROGRESS  01/05/22- 34% 03/16/22- 28%  06/01/22- 32%  5.  Patient will tolerate 15 min of standing to  perform  ADLs. Baseline: 10 minutes before needing to sit Goal status: IN PROGRESS 06/01/22 - 5-7 min before needs to sit or "perch" in kitchen with cooking.   6.  Patient will tolerate 15 min walking on treadmill to improve activity tolerance and exercise.  Baseline: 10 min max Goal status: IN PROGRESS 01/05/22- 6 minutes on treadmill  03/16/22- can walk halls for about 15 minutes now, still about 6 min on treadmill.  06/01/22 exercising regularly in pool again, but still limited with distance with walking.  Goal is 3000 steps per day.    PLAN: PT FREQUENCY: 1x/week decreasing as appropriate.    PT DURATION: 12 weeks to 08/24/22  PLANNED INTERVENTIONS: Therapeutic exercises, Therapeutic activity, Neuromuscular re-education, Balance training, Gait training, Patient/Family education, Self Care, Joint mobilization, Dry Needling, Electrical stimulation, Spinal mobilization, Manual therapy, and Re-evaluation.  PLAN FOR NEXT SESSION:  manual therapy/TrDN to lumbar multifidi/glutes/QL.  Continue to progress activity tolerance.    Jena Gauss, PT, DPT  07/13/2022, 4:04 PM   PHYSICAL THERAPY DISCHARGE SUMMARY  Visits from Start of Care: 28  Current functional level related to goals / functional outcomes: Significant decrease in intensity and frequency of LBP   Remaining deficits: Increased LBP with prolonged standing   Education / Equipment: HEP  Plan: Patient agrees to discharge.   Patient is being discharged by request, she cancelled all visits to go on extended leave.    Jena Gauss, PT 4:04 PM 08/18/2022

## 2022-07-19 ENCOUNTER — Encounter: Payer: Self-pay | Admitting: Urology

## 2022-07-19 ENCOUNTER — Ambulatory Visit: Payer: Medicare PPO | Admitting: Urology

## 2022-07-19 VITALS — BP 130/75 | HR 91 | Ht 62.0 in | Wt 124.0 lb

## 2022-07-19 DIAGNOSIS — R8281 Pyuria: Secondary | ICD-10-CM

## 2022-07-19 DIAGNOSIS — N3941 Urge incontinence: Secondary | ICD-10-CM

## 2022-07-19 DIAGNOSIS — Z87440 Personal history of urinary (tract) infections: Secondary | ICD-10-CM | POA: Diagnosis not present

## 2022-07-19 NOTE — Progress Notes (Signed)
Assessment: 1. History of UTI   2. Urge incontinence   3. Pyuria     Plan: Continue Vesicare 10 mg in the PM  Continue vaginal hormone cream  Urine culture on cath specimen sent. Will call with results May need to consider further evaluation with cystoscopy.  Chief Complaint:  Chief Complaint  Patient presents with   History of UTI    History of Present Illness:  Heidi Lutz is a 82 y.o. female who is seen for continued evaluation of her incontinence and history of UTIs. She was previously followed in [facility] and was last seen by me in April 2022. She continued on Vesicare 10 mg daily.  She reported some worsening incontinence, primarily at night.  She also noted a pressure in the bladder area with sitting and at times while lying down.  No noticeable vaginal bulge.  She continue to use vaginal hormone cream 2-3 times per week.  She was also taking ciprofloxacin 250 mg daily per her PCP, Dr. Hal Hope.  No recent urine culture results available.  She continued with symptoms of urgency, nocturia, urge incontinence.  No dysuria or gross hematuria. Urine culture from 06/20/2022 grew <10K colonies of mixed flora. At her visit in 5/24, she continued to have some intermittent dysuria.  She continued with constant pressure in the bladder area, increased with the need to void.  She was taking the Vesicare at night and had noted an improvement in her incontinence. Resolve MDX culture from 06/23/2022 grew Enterococcus and Staphylococcus.  She was treated with Macrobid x 7 days.  She returns today for follow-up.  She has noted improvement in her symptoms after completing the Macrobid.  She has had 2 episodes of UTI symptoms which resolved spontaneously.  She is not having any current bladder pain.  She continues on Solifenacin.  No recent dysuria or gross hematuria.  Urologic history: At her initial visit in January 2022, she reported symptoms of bladder pressure, frequency, urgency,  and nocturia x 2.  She also reported occasional dysuria.  She was having urge incontinence and using 3-6 pads per day.  Her last UTI was approximately 6 months prior.  She was not having any UTI symptoms.  No history of fecal incontinence.  No prior medical therapy for her urinary symptoms.  She was given a trial of Myrbetriq and instructed on a bladder diet.  She was subsequently changed to Vesicare 10 mg daily.  At her follow-up in April 2022 she was taking both Vesicare and Myrbetriq.  Her urinary symptoms were improved.  She was not having any incontinence.  No UTI symptoms.  It was recommended that she discontinue the Myrbetriq and continue Vesicare. She was subsequently seen by Dr. Gretchen Short.  She was started on vaginal hormone cream.  Her primary care doctor had started her on low-dose ciprofloxacin for prophylaxis for UTIs.  Vesicare was working well for her at the time of her last visit in October 2023.  Urine culture results: 1/22 50-100 K E. Coli 2/22 >100 K Enterococcus  Portions of the above documentation were copied from a prior visit for review purposes only.   Past Medical History:  Past Medical History:  Diagnosis Date   Cancer (HCC) 01/2017   left breast cancer, basal cell and 1 melanoma   Complication of anesthesia    heart rate spikeed in the PACU on her 2nd knee surgery   Environmental allergies    High cholesterol    OAB (overactive bladder) 02/2020  Osteoarthritis    Personal history of radiation therapy    PONV (postoperative nausea and vomiting)    with 1st knee surgery had n/v, none since   Raynaud disease    Raynauds disease-takes procardia   Rheumatoid arthritis (HCC)     Past Surgical History:  Past Surgical History:  Procedure Laterality Date   ABDOMINAL HYSTERECTOMY  1972   APPENDECTOMY  1953   BREAST BIOPSY Left 05/19/2020   benign   BREAST LUMPECTOMY Left 2019   BREAST LUMPECTOMY WITH RADIOACTIVE SEED AND SENTINEL LYMPH NODE BIOPSY Left  04/07/2017   Procedure: BREAST LUMPECTOMY WITH RADIOACTIVE SEED AND SENTINEL LYMPH NODE BIOPSY;  Surgeon: Harriette Bouillon, MD;  Location: [facility] OR;  Service: General;  Laterality: Left;   BUNIONECTOMY  1999   COLONOSCOPY     EYE SURGERY     bil cataract   GANGLION CYST EXCISION  1968   JOINT REPLACEMENT Left 2017    reverse shoulder replacement   REPLACEMENT TOTAL KNEE  2012, 2013   Right and Left   SHOULDER ARTHROSCOPY Left    SYMPATHECTOMY  1970   TONSILLECTOMY  1949    Allergies:  Allergies  Allergen Reactions   Fluorouracil Hives   Codeine Nausea And Vomiting   Hydrocodone Nausea And Vomiting   Penicillins Swelling, Rash and Other (See Comments)    Pt states allergic to all "cillin" drugs. Joint pain. Has patient had a PCN reaction causing immediate rash, facial/tongue/throat swelling, SOB or lightheadedness with hypotension: Yes Has patient had a PCN reaction causing severe rash involving mucus membranes or skin necrosis: No Has patient had a PCN reaction that required hospitalization: No Has patient had a PCN reaction occurring within the last 10 years: No If all of the above answers are "NO", then may proceed with Cephalosporin use.     Family History:  Family History  Adopted: Yes  Problem Relation Age of Onset   Breast cancer Neg Hx     Social History:  Social History   Tobacco Use   Smoking status: Former    Types: Cigarettes    Quit date: 02/22/1963    Years since quitting: 59.4    Passive exposure: Past   Smokeless tobacco: Never  Vaping Use   Vaping Use: Never used  Substance Use Topics   Alcohol use: Yes    Alcohol/week: 14.0 standard drinks of alcohol    Types: 14 Glasses of wine per week    Comment: 2 glasses of wine daily.    Drug use: No    ROS: Constitutional:  Negative for fever, chills, weight loss CV: Negative for chest pain, previous MI, hypertension Respiratory:  Negative for shortness of breath, wheezing, sleep apnea, frequent  cough GI:  Negative for nausea, vomiting, bloody stool, GERD  Physical exam: BP 130/75   Pulse 91   Ht 5\' 2"  (1.575 m)   Wt 124 lb (56.2 kg)   LMP  (LMP Unknown)   BMI 22.68 kg/m  GENERAL APPEARANCE:  Well appearing, well developed, well nourished, NAD HEENT:  Atraumatic, normocephalic, oropharynx clear NECK:  Supple without lymphadenopathy or thyromegaly ABDOMEN:  Soft, non-tender, no masses EXTREMITIES:  Moves all extremities well, without clubbing, cyanosis, or edema NEUROLOGIC:  Alert and oriented x 3, normal gait, CN II-XII grossly intact MENTAL STATUS:  appropriate BACK:  Non-tender to palpation, No CVAT SKIN:  Warm, dry, and intact GU: Urethra: no mass or discharge; no leakage with cough or valsalva Vagina: atrophic changes; grade 2 cystocele  A chaperone was present during the examination.   Results: U/A:  >30 WBC, 3-10 RBC, many bacteria, nitrite negative Cath U/A:  >30 WBC, 0-2 RBC, mod bacteria  I&O catheterization: 15 ml

## 2022-07-21 LAB — MICROSCOPIC EXAMINATION
Crystal Type: NONE SEEN
Crystals: NONE SEEN
Mucus, UA: NONE SEEN
Trichomonas, UA: NONE SEEN
WBC, UA: >30 /hpf — AB (ref 0–5)
Yeast, UA: NONE SEEN

## 2022-07-21 LAB — URINALYSIS, MICROSCOPIC ONLY
Cast Type: NONE SEEN
Casts: NONE SEEN /lpf
Crystal Type: NONE SEEN
Crystals: NONE SEEN
Trichomonas, UA: NONE SEEN
WBC, UA: >30 /hpf — AB (ref 0–5)
Yeast, UA: NONE SEEN

## 2022-07-21 LAB — URINALYSIS, ROUTINE W REFLEX MICROSCOPIC
Bilirubin, UA: NEGATIVE
Glucose, UA: NEGATIVE
Nitrite, UA: NEGATIVE
Specific Gravity, UA: 1.02 (ref 1.005–1.030)
Urobilinogen, Ur: 0.2 mg/dL (ref 0.2–1.0)
pH, UA: 7 (ref 5.0–7.5)

## 2022-07-21 LAB — URINE CULTURE

## 2022-07-22 ENCOUNTER — Encounter: Payer: Self-pay | Admitting: Urology

## 2022-07-22 MED ORDER — FOSFOMYCIN TROMETHAMINE 3 G PO PACK
PACK | ORAL | 0 refills | Status: DC
Start: 2022-07-22 — End: 2022-08-16

## 2022-07-22 MED ORDER — CIPROFLOXACIN HCL 500 MG PO TABS
500.0000 mg | ORAL_TABLET | Freq: Two times a day (BID) | ORAL | 0 refills | Status: AC
Start: 2022-07-22 — End: 2022-07-27

## 2022-07-22 NOTE — Addendum Note (Signed)
Addended by: Milderd Meager on: 07/22/2022 04:08 PM   Modules accepted: Orders

## 2022-07-27 ENCOUNTER — Ambulatory Visit: Payer: Medicare PPO | Admitting: Physical Therapy

## 2022-08-03 ENCOUNTER — Encounter: Payer: Medicare PPO | Admitting: Physical Therapy

## 2022-08-04 ENCOUNTER — Other Ambulatory Visit: Payer: Self-pay

## 2022-08-10 ENCOUNTER — Encounter: Payer: Medicare PPO | Admitting: Physical Therapy

## 2022-08-16 ENCOUNTER — Encounter: Payer: Self-pay | Admitting: Urology

## 2022-08-16 ENCOUNTER — Ambulatory Visit: Payer: Medicare PPO | Admitting: Urology

## 2022-08-16 ENCOUNTER — Encounter: Payer: Self-pay | Admitting: Cardiology

## 2022-08-16 ENCOUNTER — Ambulatory Visit: Payer: Medicare PPO | Attending: Cardiology | Admitting: Cardiology

## 2022-08-16 VITALS — BP 126/62 | HR 104 | Ht 62.6 in | Wt 123.1 lb

## 2022-08-16 VITALS — BP 123/70 | HR 94 | Ht 62.0 in | Wt 120.0 lb

## 2022-08-16 DIAGNOSIS — I10 Essential (primary) hypertension: Secondary | ICD-10-CM | POA: Diagnosis not present

## 2022-08-16 DIAGNOSIS — E782 Mixed hyperlipidemia: Secondary | ICD-10-CM | POA: Diagnosis not present

## 2022-08-16 DIAGNOSIS — I700 Atherosclerosis of aorta: Secondary | ICD-10-CM

## 2022-08-16 DIAGNOSIS — I447 Left bundle-branch block, unspecified: Secondary | ICD-10-CM

## 2022-08-16 DIAGNOSIS — R8281 Pyuria: Secondary | ICD-10-CM | POA: Diagnosis not present

## 2022-08-16 DIAGNOSIS — N3941 Urge incontinence: Secondary | ICD-10-CM | POA: Diagnosis not present

## 2022-08-16 DIAGNOSIS — Z87440 Personal history of urinary (tract) infections: Secondary | ICD-10-CM | POA: Diagnosis not present

## 2022-08-16 HISTORY — DX: Atherosclerosis of aorta: I70.0

## 2022-08-16 LAB — MICROSCOPIC EXAMINATION
Crystal Type: NONE SEEN
Crystals: NONE SEEN
Epithelial Cells (non renal): >10 /hpf — AB (ref 0–10)
Renal Epithel, UA: >10 /hpf — AB
Trichomonas, UA: NONE SEEN
Yeast, UA: NONE SEEN

## 2022-08-16 LAB — URINALYSIS, ROUTINE W REFLEX MICROSCOPIC
Bilirubin, UA: NEGATIVE
Glucose, UA: NEGATIVE
Ketones, UA: NEGATIVE
Nitrite, UA: NEGATIVE
RBC, UA: NEGATIVE
Specific Gravity, UA: 1.015 (ref 1.005–1.030)
Urobilinogen, Ur: 0.2 mg/dL (ref 0.2–1.0)
pH, UA: 6.5 (ref 5.0–7.5)

## 2022-08-16 MED ORDER — FOSFOMYCIN TROMETHAMINE 3 G PO PACK: PACK | ORAL | 2 refills | Status: DC

## 2022-08-16 NOTE — Patient Instructions (Signed)
Medication Instructions:  Your physician recommends that you continue on your current medications as directed. Please refer to the Current Medication list given to you today.  *If you need a refill on your cardiac medications before your next appointment, please call your pharmacy*   Lab Work: None ordered If you have labs (blood work) drawn today and your tests are completely normal, you will receive your results only by: MyChart Message (if you have MyChart) OR A paper copy in the mail If you have any lab test that is abnormal or we need to change your treatment, we will call you to review the results.   Testing/Procedures: None ordered   Follow-Up: At [facility], you and your health needs are our priority.  As part of our continuing mission to provide you with exceptional heart care, we have created designated Provider Care Teams.  These Care Teams include your primary Cardiologist (physician) and Advanced Practice Providers (APPs -  Physician Assistants and Nurse Practitioners) who all work together to provide you with the care you need, when you need it.  We recommend signing up for the patient portal called "MyChart".  Sign up information is provided on this After Visit Summary.  MyChart is used to connect with patients for Virtual Visits (Telemedicine).  Patients are able to view lab/test results, encounter notes, upcoming appointments, etc.  Non-urgent messages can be sent to your provider as well.   To learn more about what you can do with MyChart, go to [email or web address].    Your next appointment:   9 month(s)  The format for your next appointment:   In Person  Provider:   Rajan Revankar, MD    Other Instructions none  Important Information About Sugar       

## 2022-08-16 NOTE — Progress Notes (Signed)
Assessment: 1. History of UTI   2. Urge incontinence   3. Pyuria     Plan: Continue Vesicare 10 mg in the PM  Continue vaginal hormone cream  The pyuria is significantly improved.  Her urinalysis today would suggest vaginal contamination with increased epithelial cells. Recommend beginning fosfomycin 3 g every 10 days for UTI prevention. Return to office in 6 weeks.  Chief Complaint:  Chief Complaint  Patient presents with   History of UTI    History of Present Illness:  Heidi Lutz is a 82 y.o. female who is seen for continued evaluation of her incontinence and history of UTIs. She was previously followed in [facility] and was last seen by me in April 2022. She continued on Vesicare 10 mg daily.  She reported some worsening incontinence, primarily at night.  She also noted a pressure in the bladder area with sitting and at times while lying down.  No noticeable vaginal bulge.  She continue to use vaginal hormone cream 2-3 times per week.  She was also taking ciprofloxacin 250 mg daily per her PCP, Dr. Hal Hope.  No recent urine culture results available.  She continued with symptoms of urgency, nocturia, urge incontinence.  No dysuria or gross hematuria. Urine culture from 06/20/2022 grew <10K colonies of mixed flora. At her visit in 5/24, she continued to have some intermittent dysuria.  She continued with constant pressure in the bladder area, increased with the need to void.  She was taking the Vesicare at night and had noted an improvement in her incontinence. Resolve MDX culture from 06/23/2022 grew Enterococcus and Staphylococcus.  She was treated with Macrobid x 7 days. She noted improvement in her symptoms after completing the Macrobid.  She had 2 episodes of UTI symptoms which resolved spontaneously. She continued on Solifenacin.  No recent dysuria or gross hematuria. Urinalysis from 07/19/2022 again showed pyuria.  Cath urine confirmed presence of pyuria.  Pelvic exam  demonstrated atrophic changes and a grade 2 cystocele.  Residual was 15 mL. Urine culture grew 25-50 K E. coli on cath specimen.  She was treated with Cipro x 5 days and started on fosfomycin 3 g every 10 days for UTI prevention.   She presents today for follow-up.  She has not started the fosfomycin suppression yet due to scheduled spinal injections.  She is not having any UTI symptoms at the present time.  She continues on Solifenacin with good control of her urinary incontinence.  Urologic history: At her initial visit in January 2022, she reported symptoms of bladder pressure, frequency, urgency, and nocturia x 2.  She also reported occasional dysuria.  She was having urge incontinence and using 3-6 pads per day.  Her last UTI was approximately 6 months prior.  She was not having any UTI symptoms.  No history of fecal incontinence.  No prior medical therapy for her urinary symptoms.  She was given a trial of Myrbetriq and instructed on a bladder diet.  She was subsequently changed to Vesicare 10 mg daily.  At her follow-up in April 2022 she was taking both Vesicare and Myrbetriq.  Her urinary symptoms were improved.  She was not having any incontinence.  No UTI symptoms.  It was recommended that she discontinue the Myrbetriq and continue Vesicare. She was subsequently seen by Dr. Gretchen Short.  She was started on vaginal hormone cream.  Her primary care doctor had started her on low-dose ciprofloxacin for prophylaxis for UTIs.  Vesicare was working well for her  at the time of her last visit in October 2023.  Urine culture results: 1/22 50-100 K E. Coli 2/22 >100 K Enterococcus  Portions of the above documentation were copied from a prior visit for review purposes only.   Past Medical History:  Past Medical History:  Diagnosis Date   Age-related osteoporosis without current pathological fracture 10/31/2019   Allergic rhinitis 07/31/2007   Overview:   S/p allergy shots, 20 years ago Dr. Michaela Corner      10/1 IMO update   Basal cell carcinoma 12/15/2014   Overview:   Managed by Dr. Sharyn Lull, derm   Cancer [facility]) 01/2017   left breast cancer, basal cell and 1 melanoma   Cervical spondylosis 07/13/2022   Closed compression fracture of L1 lumbar vertebra, with routine healing, subsequent encounter 11/26/2013   Overview:   Normal DEXA 2015     Last Assessment & Plan:   New order for DEXA written today. Patient is getting epidural steroid injections with pain management  Overview:   Overview:   Normal DEXA 2015     Last Assessment & Plan:   continued improvement in pain, will refill tramadol and robaxin today.  Will return to PCP for follow-up   Complication of anesthesia    heart rate spikeed in the PACU on her 2nd knee surgery   Environmental allergies    Essential hypertension 09/19/2008   Last Assessment & Plan:   Blood pressure is elevated today. I reviewed her last few pain management notes her blood pressure was normal. Patient is asymptomatic and I suspect today's blood pressure is an outlier. She will continue home monitoring at her offices and will return for further evaluation if remains elevated   GAD (generalized anxiety disorder) 04/25/2017   Last Assessment & Plan:   See a/p above   High cholesterol    History of UTI 05/25/2022   History of vertebral compression fracture 07/16/2019   Hyperlipidemia 04/22/2009   Last Assessment & Plan:   Compliant with statin, continue   IBS (irritable bowel syndrome) 08/18/2006   Overview:   with IBS        Last Assessment & Plan:   Refill Bentyl for when necessary use. No evidence of infectious or inflammatory, or malignancy symptoms. Advised to follow-up for further evaluation if more frequent or new symptoms   Insomnia 02/02/2010   Last Assessment & Plan:   Refilled Ambien quantity #30 for the next calendar year. She denies excessive sedation and is aware to not drive when taking medication.   LBBB (left bundle branch block) 03/22/2017    Lumbar degenerative disc disease 02/27/2015   Lumbar spinal stenosis 02/27/2015   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   Last Assessment & Plan:   Depression and anxiety triggered by recent diagnosis of breast cancer and upcoming treatments.  Will start Lexapro today.  We discussed interim use of benzodiazepines for pre-procedural or severe episodes.  She reports she has had panic attacks in the past but has not had them recently.  She never took diazepam that was previously prescribed for a procedure.  We discussed   Malignant neoplasm of central portion of left breast in female, estrogen receptor positive (HCC) 03/16/2017   Mixed hyperlipidemia 03/22/2017   OAB (overactive bladder) 02/2020   Osteoarthritis    Osteoarthritis of spine with radiculopathy, lumbar region 02/27/2015   Osteopenia 03/03/2017   Overview:   DEXA 02/2017   Personal history of radiation therapy    Pleuritic chest pain 04/10/2018   PONV (postoperative  nausea and vomiting)    with 1st knee surgery had n/v, none since   Pre-operative cardiovascular examination 03/22/2017   Raynaud disease    Raynauds disease-takes procardia   Raynaud's disease 08/18/2006   Rheumatoid arthritis (HCC)    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 Valentina Lucks -- [place], [place]   Urge incontinence 05/25/2022    Past Surgical History:  Past Surgical History:  Procedure Laterality Date    ABDOMINAL HYSTERECTOMY  1972   APPENDECTOMY  1953   BREAST BIOPSY Left 05/19/2020   benign   BREAST LUMPECTOMY Left 2019   BREAST LUMPECTOMY WITH RADIOACTIVE SEED AND SENTINEL LYMPH NODE BIOPSY Left 04/07/2017   Procedure: BREAST LUMPECTOMY WITH RADIOACTIVE SEED AND SENTINEL LYMPH NODE BIOPSY;  Surgeon: Harriette Bouillon, MD;  Location: [facility] OR;  Service: General;  Laterality: Left;   BUNIONECTOMY  1999   COLONOSCOPY     EYE SURGERY     bil cataract   GANGLION CYST EXCISION  1968   JOINT REPLACEMENT Left 2017    reverse shoulder replacement   REPLACEMENT TOTAL KNEE  2012, 2013   Right and Left   SHOULDER ARTHROSCOPY Left    SYMPATHECTOMY  1970   TONSILLECTOMY  1949    Allergies:  Allergies  Allergen Reactions   Fluorouracil Hives   Codeine Nausea And Vomiting   Hydrocodone Nausea And Vomiting   Penicillins Swelling, Rash and Other (See Comments)    Pt states allergic to all "cillin" drugs. Joint pain. Has patient had a PCN reaction causing immediate rash, facial/tongue/throat swelling, SOB or lightheadedness with hypotension: Yes Has patient had a PCN reaction causing severe rash involving mucus membranes or skin necrosis: No Has patient had a PCN reaction that required hospitalization: No Has patient had a PCN reaction occurring within the last 10 years: No If all of the above answers are "NO", then may proceed with Cephalosporin use.     Family History:  Family History  Adopted: Yes  Problem Relation Age of Onset   Breast cancer Neg Hx     Social History:  Social History   Tobacco Use   Smoking status: Former    Types: Cigarettes    Quit date: 02/22/1963    Years since quitting: 59.5    Passive exposure: Past   Smokeless tobacco: Never  Vaping Use   Vaping Use: Never used  Substance Use Topics   Alcohol use: Yes    Alcohol/week: 14.0 standard drinks of alcohol    Types: 14 Glasses of wine per week    Comment: 2 glasses of wine daily.    Drug use: No     ROS: Constitutional:  Negative for fever, chills, weight loss CV: Negative for chest pain, previous MI, hypertension Respiratory:  Negative for shortness of breath, wheezing, sleep apnea, frequent cough GI:  Negative for nausea, vomiting, bloody stool, GERD  Physical exam: BP 123/70   Pulse 94  Ht 5\' 2"  (1.575 m)   Wt 120 lb (54.4 kg)   LMP  (LMP Unknown)   BMI 21.95 kg/m  GENERAL APPEARANCE:  Well appearing, well developed, well nourished, NAD HEENT:  Atraumatic, normocephalic, oropharynx clear NECK:  Supple without lymphadenopathy or thyromegaly ABDOMEN:  Soft, non-tender, no masses EXTREMITIES:  Moves all extremities well, without clubbing, cyanosis, or edema NEUROLOGIC:  Alert and oriented x 3, normal gait, CN II-XII grossly intact MENTAL STATUS:  appropriate BACK:  Non-tender to palpation, No CVAT SKIN:  Warm, dry, and intact   Results: U/A:  6-10 WBC, 0-2 RBC, >10 epithelial cells, moderate bacteria

## 2022-08-16 NOTE — Progress Notes (Signed)
Cardiology Office Note:    Date:  08/16/2022   ID:  Heidi Lutz, Heidi Lutz 03/19/1940, MRN [id number]  PCP:  Dois Davenport, MD  Cardiologist:  Garwin Brothers, MD   Referring MD: Dois Davenport, MD    ASSESSMENT:    1. Essential hypertension   2. LBBB (left bundle branch block)   3. Mixed dyslipidemia   4. Aortic atherosclerosis (HCC)    PLAN:    In order of problems listed above:  Primary prevention stressed with the patient.  Importance of compliance with diet medication stressed and patient verbalized standing. Patient ambulates well and is appropriately and encouraged her to continue doing this.  She has had blood work recently done by primary care and we will get a copy of it. Left bundle branch block: Stable and asymptomatic.  Medical management. Essential hypertension: Blood pressure is stable and diet was emphasized. Mixed dyslipidemia: On lipid-lowering medications diet emphasized. Aortic atherosclerosis: Stable.  Lipids will be reviewed and intervene if necessary. Patient had multiple questions which were answered to her satisfaction.   Medication Adjustments/Labs and Tests Ordered: Current medicines are reviewed at length with the patient today.  Concerns regarding medicines are outlined above.  Orders Placed This Encounter  Procedures   EKG 12-Lead   No orders of the defined types were placed in this encounter.    No chief complaint on file.    History of Present Illness:    Heidi Lutz is a 82 y.o. female.  Patient has past medical history of essential hypertension, dyslipidemia, left bundle branch block and Raynaud's disease.  She has history of aortic atherosclerosis.  She denies any problems at this time and takes care of activities of daily living.  No chest pain orthopnea or PND.  She walks on a regular basis.  She ambulates age appropriately.  Past Medical History:  Diagnosis Date   Age-related osteoporosis without current  pathological fracture 10/31/2019   Allergic rhinitis 07/31/2007   Overview:   S/p allergy shots, 20 years ago Dr. Michaela Corner     10/1 IMO update   Basal cell carcinoma 12/15/2014   Overview:   Managed by Dr. Sharyn Lull, derm   Cancer [facility]) 01/2017   left breast cancer, basal cell and 1 melanoma   Cervical spondylosis 07/13/2022   Closed compression fracture of L1 lumbar vertebra, with routine healing, subsequent encounter 11/26/2013   Overview:   Normal DEXA 2015     Last Assessment & Plan:   New order for DEXA written today. Patient is getting epidural steroid injections with pain management  Overview:   Overview:   Normal DEXA 2015     Last Assessment & Plan:   continued improvement in pain, will refill tramadol and robaxin today.  Will return to PCP for follow-up   Complication of anesthesia    heart rate spikeed in the PACU on her 2nd knee surgery   Environmental allergies    Essential hypertension 09/19/2008   Last Assessment & Plan:   Blood pressure is elevated today. I reviewed her last few pain management notes her blood pressure was normal. Patient is asymptomatic and I suspect today's blood pressure is an outlier. She will continue home monitoring at her offices and will return for further evaluation if remains elevated   GAD (generalized anxiety disorder) 04/25/2017   Last Assessment & Plan:   See a/p above   High cholesterol    History of UTI 05/25/2022   History of vertebral compression fracture  07/16/2019   Hyperlipidemia 04/22/2009   Last Assessment & Plan:   Compliant with statin, continue   IBS (irritable bowel syndrome) 08/18/2006   Overview:   with IBS        Last Assessment & Plan:   Refill Bentyl for when necessary use. No evidence of infectious or inflammatory, or malignancy symptoms. Advised to follow-up for further evaluation if more frequent or new symptoms   Insomnia 02/02/2010   Last Assessment & Plan:   Refilled Ambien quantity #30 for the next calendar year. She  denies excessive sedation and is aware to not drive when taking medication.   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  Will start Lexapro today.  We discussed interim use of benzodiazepines for pre-procedural or severe episodes.  She reports she has had panic attacks in the past but has not had them recently.  She never took diazepam that was previously prescribed for a procedure.  We discussed   Malignant neoplasm of central portion of left breast in female, estrogen receptor positive (HCC) 03/16/2017   Mixed hyperlipidemia 03/22/2017   OAB (overactive bladder) 02/2020   Osteoarthritis    Osteoarthritis of spine with radiculopathy, lumbar region 02/27/2015   Osteopenia 03/03/2017   Overview:   DEXA 02/2017   Personal history of radiation therapy    Pleuritic chest pain 04/10/2018   PONV (postoperative nausea and vomiting)    with 1st knee surgery had n/v, none since   Pre-operative cardiovascular examination 03/22/2017   Raynaud disease    Raynauds disease-takes procardia   Raynaud's disease 08/18/2006   Rheumatoid arthritis (HCC)    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 Valentina Lucks --  [place], [place]   Urge incontinence 05/25/2022    Past Surgical History:  Procedure Laterality Date   ABDOMINAL HYSTERECTOMY  1972   APPENDECTOMY  1953   BREAST BIOPSY Left 05/19/2020   benign   BREAST LUMPECTOMY Left 2019   BREAST LUMPECTOMY WITH RADIOACTIVE SEED AND SENTINEL LYMPH NODE BIOPSY Left 04/07/2017   Procedure: BREAST LUMPECTOMY WITH RADIOACTIVE SEED AND SENTINEL LYMPH NODE BIOPSY;  Surgeon: Harriette Bouillon, MD;  Location: [facility] OR;  Service: General;  Laterality: Left;   BUNIONECTOMY  1999   COLONOSCOPY     EYE SURGERY     bil cataract   GANGLION CYST EXCISION  1968   JOINT REPLACEMENT Left 2017    reverse shoulder replacement   REPLACEMENT TOTAL KNEE  2012, 2013   Right and Left   SHOULDER ARTHROSCOPY Left    SYMPATHECTOMY  1970   TONSILLECTOMY  1949    Current Medications: Current Meds  Medication Sig   acetaminophen (TYLENOL) 500 MG tablet Take 1,000 mg by mouth every  6 (six) hours as needed for moderate pain or headache.    atorvastatin (LIPITOR) 20 MG tablet Take 20 mg by mouth daily.   Black Cohosh 540 MG CAPS Take 540 mg by mouth daily.    calcium gluconate 500 MG tablet Take 1 tablet (500 mg total) by mouth 2 (two) times daily.   cetirizine (ZYRTEC) 10 MG tablet Take 10 mg by mouth daily.   CRANBERRY PO Take 1 capsule by mouth daily.    denosumab (PROLIA) 60 MG/ML SOSY injection Inject 60 mg into the skin every 6 (six) months.   estradiol (ESTRACE) 0.1 MG/GM vaginal cream Place 1 Applicatorful vaginally at bedtime.   fosfomycin (MONUROL) 3 g PACK Take 1 packet by mouth every 10 days   Glucosamine-Chondroit-Vit C-Mn (GLUCOSAMINE-CHONDROITIN MAX ST) CAPS Take 1 capsule by mouth daily.    lidocaine (LIDODERM) 5 % APPLY 2 PATCHES TO SKIN EVERY DAY, REMOVE AND REPLACE PATCH AFTER 12 HRS   losartan (COZAAR) 25 MG tablet Take 25 mg by mouth daily.   Magnesium 200 MG TABS Take 200 mg by mouth daily.    Multiple Vitamins-Minerals (CENTRUM ADULTS PO) Take 1 tablet by  mouth daily.    NIFEdipine (PROCARDIA XL/ADALAT-CC) 90 MG 24 hr tablet Take 90 mg by mouth daily.    Probiotic Product (PROBIOTIC ADVANCED PO) Take 1 capsule by mouth daily.    solifenacin (VESICARE) 10 MG tablet Take 1 tablet (10 mg total) by mouth every evening.   tamoxifen (NOLVADEX) 20 MG tablet Take 1 tablet (20 mg total) by mouth daily.   traMADol (ULTRAM) 50 MG tablet Take 0.5 tablets (25 mg total) by mouth daily as needed.     Allergies:   Fluorouracil, Codeine, Hydrocodone, and Penicillins   Social History   Socioeconomic History   Marital status: Married    Spouse name: Not on file   Number of children: Not on file   Years of education: Not on file   Highest education level: Not on file  Occupational History   Not on file  Tobacco Use   Smoking status: Former    Types: Cigarettes    Quit date: 02/22/1963    Years since quitting: 59.5    Passive exposure: Past   Smokeless tobacco: Never  Vaping Use   Vaping Use: Never used  Substance and Sexual Activity   Alcohol use: Yes    Alcohol/week: 14.0 standard drinks of alcohol    Types: 14 Glasses of wine per week    Comment: 2 glasses of wine daily.    Drug use: No   Sexual activity: Not on file  Other Topics Concern   Not on file  Social History Narrative   Not on file   Social Determinants of Health   Financial Resource Strain: Not on file  Food Insecurity: Not on file  Transportation Needs: Not on file  Physical Activity: Not on file  Stress: Not on file  Social Connections: Not on file     Family History: The patient's family history is negative for Breast cancer. She was adopted.  ROS:   Please see the history of present illness.    All other systems reviewed and are negative.  EKGs/Labs/Other Studies Reviewed:    The following studies were reviewed today: EKG reveals sinus bradycardia PVCs and left bundle branch block and nonspecific ST-T changes   Recent Labs: No results found for requested  labs within last 365 days.  Recent Lipid Panel No results found for: "CHOL", "TRIG", "  HDL", "CHOLHDL", "VLDL", "LDLCALC", "LDLDIRECT"  Physical Exam:    VS:  BP 126/62   Pulse (!) 104   Ht 5' 2.6" (1.59 m)   Wt 123 lb 1.3 oz (55.8 kg)   LMP  (LMP Unknown)   SpO2 95%   BMI 22.08 kg/m     Wt Readings from Last 3 Encounters:  08/16/22 123 lb 1.3 oz (55.8 kg)  08/16/22 120 lb (54.4 kg)  07/19/22 124 lb (56.2 kg)     GEN: Patient is in no acute distress HEENT: Normal NECK: No JVD; No carotid bruits LYMPHATICS: No lymphadenopathy CARDIAC: Hear sounds regular, 2/6 systolic murmur at the apex. RESPIRATORY:  Clear to auscultation without rales, wheezing or rhonchi  ABDOMEN: Soft, non-tender, non-distended MUSCULOSKELETAL:  No edema; No deformity  SKIN: Warm and dry NEUROLOGIC:  Alert and oriented x 3 PSYCHIATRIC:  Normal affect   Signed, Garwin Brothers, MD  08/16/2022 2:12 PM    [facility]

## 2022-08-17 ENCOUNTER — Ambulatory Visit: Payer: Medicare PPO | Admitting: Physical Therapy

## 2022-10-04 ENCOUNTER — Encounter: Payer: Self-pay | Admitting: Urology

## 2022-10-04 ENCOUNTER — Ambulatory Visit: Payer: Medicare PPO | Admitting: Urology

## 2022-10-04 VITALS — BP 125/77 | HR 91 | Ht 62.0 in | Wt 123.0 lb

## 2022-10-04 DIAGNOSIS — Z87440 Personal history of urinary (tract) infections: Secondary | ICD-10-CM | POA: Diagnosis not present

## 2022-10-04 DIAGNOSIS — N3941 Urge incontinence: Secondary | ICD-10-CM

## 2022-10-04 MED ORDER — FOSFOMYCIN TROMETHAMINE 3 G PO PACK
PACK | ORAL | 3 refills | Status: DC
Start: 2022-10-04 — End: 2023-01-04

## 2022-10-04 NOTE — Progress Notes (Signed)
Assessment: 1. Urge incontinence   2. History of UTI     Plan: Continue Vesicare 10 mg in the PM  Continue vaginal hormone cream  Continue fosfomycin 3 g every 10 days for UTI prevention. Return to office in 3 months  Chief Complaint:  Chief Complaint  Patient presents with   Urinary Incontinence    History of Present Illness:  Heidi Lutz is a 82 y.o. female who is seen for continued evaluation of her incontinence and history of UTIs. She was previously followed in [facility] and was last seen by me there in April 2022. She continued on Vesicare 10 mg daily.  She reported some worsening incontinence, primarily at night.  She also noted a pressure in the bladder area with sitting and at times while lying down.  No noticeable vaginal bulge.  She continue to use vaginal hormone cream 2-3 times per week.  She was also taking ciprofloxacin 250 mg daily per her PCP, Dr. Hal Hope.  No recent urine culture results available.  She continued with symptoms of urgency, nocturia, urge incontinence.  No dysuria or gross hematuria. Urine culture from 06/20/2022 grew <10K colonies of mixed flora. At her visit in 5/24, she continued to have some intermittent dysuria.  She continued with constant pressure in the bladder area, increased with the need to void.  She was taking the Vesicare at night and had noted an improvement in her incontinence. Resolve MDX culture from 06/23/2022 grew Enterococcus and Staphylococcus.  She was treated with Macrobid x 7 days. She noted improvement in her symptoms after completing the Macrobid.  She had 2 episodes of UTI symptoms which resolved spontaneously. She continued on Solifenacin.  No recent dysuria or gross hematuria. Urinalysis from 07/19/2022 again showed pyuria.  Cath urine confirmed presence of pyuria.  Pelvic exam demonstrated atrophic changes and a grade 2 cystocele.  Residual was 15 mL. Urine culture grew 25-50 K E. coli on cath specimen.  She was  treated with Cipro x 5 days and started on fosfomycin 3 g every 10 days for UTI prevention.  At her visit in June 2024, she was not having any UTI symptoms.  She continued on solifenacin with good control of her urinary symptoms.  She had not started the fosfomycin.  She returns today for follow-up.  She is doing very well from a urinary standpoint.  No UTI symptoms.  She is using the fosfomycin every 10 days without side effects.  She continues on the vaginal hormone cream and Solifenacin.  Her incontinence symptoms are well-controlled.  No dysuria or gross hematuria.   Urologic history: At her initial visit in January 2022, she reported symptoms of bladder pressure, frequency, urgency, and nocturia x 2.  She also reported occasional dysuria.  She was having urge incontinence and using 3-6 pads per day.  Her last UTI was approximately 6 months prior.  She was not having any UTI symptoms.  No history of fecal incontinence.  No prior medical therapy for her urinary symptoms.  She was given a trial of Myrbetriq and instructed on a bladder diet.  She was subsequently changed to Vesicare 10 mg daily.  At her follow-up in April 2022 she was taking both Vesicare and Myrbetriq.  Her urinary symptoms were improved.  She was not having any incontinence.  No UTI symptoms.  It was recommended that she discontinue the Myrbetriq and continue Vesicare. She was subsequently seen by Dr. Gretchen Short.  She was started on vaginal hormone cream.  Her primary care doctor had started her on low-dose ciprofloxacin for prophylaxis for UTIs.  Vesicare was working well for her at the time of her last visit in October 2023.  Urine culture results: 1/22 50-100 K E. Coli 2/22 >100 K Enterococcus  Portions of the above documentation were copied from a prior visit for review purposes only.   Past Medical History:  Past Medical History:  Diagnosis Date   Age-related osteoporosis without current pathological fracture 10/31/2019    Allergic rhinitis 07/31/2007   Overview:   S/p allergy shots, 20 years ago Dr. Michaela Corner     10/1 IMO update   Basal cell carcinoma 12/15/2014   Overview:   Managed by Dr. Sharyn Lull, derm   Cancer [facility]) 01/2017   left breast cancer, basal cell and 1 melanoma   Cervical spondylosis 07/13/2022   Closed compression fracture of L1 lumbar vertebra, with routine healing, subsequent encounter 11/26/2013   Overview:   Normal DEXA 2015     Last Assessment & Plan:   New order for DEXA written today. Patient is getting epidural steroid injections with pain management  Overview:   Overview:   Normal DEXA 2015     Last Assessment & Plan:   continued improvement in pain, will refill tramadol and robaxin today.  Will return to PCP for follow-up   Complication of anesthesia    heart rate spikeed in the PACU on her 2nd knee surgery   Environmental allergies    Essential hypertension 09/19/2008   Last Assessment & Plan:   Blood pressure is elevated today. I reviewed her last few pain management notes her blood pressure was normal. Patient is asymptomatic and I suspect today's blood pressure is an outlier. She will continue home monitoring at her offices and will return for further evaluation if remains elevated   GAD (generalized anxiety disorder) 04/25/2017   Last Assessment & Plan:   See a/p above   High cholesterol    History of UTI 05/25/2022   History of vertebral compression fracture 07/16/2019   Hyperlipidemia 04/22/2009   Last Assessment & Plan:   Compliant with statin, continue   IBS (irritable bowel syndrome) 08/18/2006   Overview:   with IBS        Last Assessment & Plan:   Refill Bentyl for when necessary use. No evidence of infectious or inflammatory, or malignancy symptoms. Advised to follow-up for further evaluation if more frequent or new symptoms   Insomnia 02/02/2010   Last Assessment & Plan:   Refilled Ambien quantity #30 for the next calendar year. She denies excessive sedation and is  aware to not drive when taking medication.   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  Will start Lexapro today.  We discussed interim use of benzodiazepines for pre-procedural or severe episodes.  She reports she has had panic attacks in the past but has not had them recently.  She never took diazepam that was previously prescribed for a procedure.  We discussed   Malignant neoplasm of central portion of left breast in female, estrogen receptor positive (HCC) 03/16/2017   Mixed hyperlipidemia 03/22/2017   OAB (overactive bladder) 02/2020   Osteoarthritis    Osteoarthritis of spine with radiculopathy, lumbar region 02/27/2015   Osteopenia 03/03/2017   Overview:  DEXA 02/2017   Personal history of radiation therapy    Pleuritic chest pain 04/10/2018   PONV (postoperative nausea and vomiting)    with 1st knee surgery had n/v, none since   Pre-operative cardiovascular examination 03/22/2017   Raynaud disease    Raynauds disease-takes procardia   Raynaud's disease 08/18/2006   Rheumatoid arthritis (HCC)    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 Valentina Lucks -- [place], [place]   Urge incontinence  05/25/2022    Past Surgical History:  Past Surgical History:  Procedure Laterality Date   ABDOMINAL HYSTERECTOMY  1972   APPENDECTOMY  1953   BREAST BIOPSY Left 05/19/2020   benign   BREAST LUMPECTOMY Left 2019   BREAST LUMPECTOMY WITH RADIOACTIVE SEED AND SENTINEL LYMPH NODE BIOPSY Left 04/07/2017   Procedure: BREAST LUMPECTOMY WITH RADIOACTIVE SEED AND SENTINEL LYMPH NODE BIOPSY;  Surgeon: Harriette Bouillon, MD;  Location: [facility] OR;  Service: General;  Laterality: Left;   BUNIONECTOMY  1999   COLONOSCOPY     EYE SURGERY     bil cataract   GANGLION CYST EXCISION  1968   JOINT REPLACEMENT Left 2017    reverse shoulder replacement   REPLACEMENT TOTAL KNEE  2012, 2013   Right and Left   SHOULDER ARTHROSCOPY Left    SYMPATHECTOMY  1970   TONSILLECTOMY  1949    Allergies:  Allergies  Allergen Reactions   Fluorouracil Hives   Codeine Nausea And Vomiting   Hydrocodone Nausea And Vomiting   Penicillins Swelling, Rash and Other (See Comments)    Pt states allergic to all "cillin" drugs. Joint pain. Has patient had a PCN reaction causing immediate rash, facial/tongue/throat swelling, SOB or lightheadedness with hypotension: Yes Has patient had a PCN reaction causing severe rash involving mucus membranes or skin necrosis: No Has patient had a PCN reaction that required hospitalization: No Has patient had a PCN reaction occurring within the last 10 years: No If all of the above answers are "NO", then may proceed with Cephalosporin use.     Family History:  Family History  Adopted: Yes  Problem Relation Age of Onset   Breast cancer Neg Hx     Social History:  Social History   Tobacco Use   Smoking status: Former    Current packs/day: 0.00    Types: Cigarettes    Quit date: 02/22/1963    Years since quitting: 59.6    Passive exposure: Past   Smokeless tobacco: Never  Vaping Use   Vaping status: Never Used  Substance Use Topics   Alcohol use: Yes    Alcohol/week: 14.0  standard drinks of alcohol    Types: 14 Glasses of wine per week    Comment: 2 glasses of wine daily.    Drug use: No    ROS: Constitutional:  Negative for fever, chills, weight loss CV: Negative for chest pain, previous MI, hypertension Respiratory:  Negative for shortness of  breath, wheezing, sleep apnea, frequent cough GI:  Negative for nausea, vomiting, bloody stool, GERD  Physical exam: BP 125/77   Pulse 91   Ht 5\' 2"  (1.575 m)   Wt 123 lb (55.8 kg)   LMP  (LMP Unknown)   BMI 22.50 kg/m  GENERAL APPEARANCE:  Well appearing, well developed, well nourished, NAD HEENT:  Atraumatic, normocephalic, oropharynx clear NECK:  Supple without lymphadenopathy or thyromegaly ABDOMEN:  Soft, non-tender, no masses EXTREMITIES:  Moves all extremities well, without clubbing, cyanosis, or edema NEUROLOGIC:  Alert and oriented x 3, normal gait, CN II-XII grossly intact MENTAL STATUS:  appropriate BACK:  Non-tender to palpation, No CVAT SKIN:  Warm, dry, and intact   Results: U/A:  0-5 WBC, 0-2 RBC

## 2022-11-08 ENCOUNTER — Other Ambulatory Visit (HOSPITAL_COMMUNITY): Payer: Self-pay | Admitting: Family Medicine

## 2022-11-08 DIAGNOSIS — I129 Hypertensive chronic kidney disease with stage 1 through stage 4 chronic kidney disease, or unspecified chronic kidney disease: Secondary | ICD-10-CM

## 2022-11-08 DIAGNOSIS — I10 Essential (primary) hypertension: Secondary | ICD-10-CM

## 2022-11-16 ENCOUNTER — Ambulatory Visit (HOSPITAL_COMMUNITY)
Admission: RE | Admit: 2022-11-16 | Discharge: 2022-11-16 | Disposition: A | Discharge status: Home / Self Care | Payer: Medicare PPO | Source: Ambulatory Visit | Attending: Cardiology | Admitting: Cardiology

## 2022-11-16 DIAGNOSIS — I129 Hypertensive chronic kidney disease with stage 1 through stage 4 chronic kidney disease, or unspecified chronic kidney disease: Secondary | ICD-10-CM | POA: Diagnosis not present

## 2022-11-16 DIAGNOSIS — I10 Essential (primary) hypertension: Secondary | ICD-10-CM | POA: Diagnosis present

## 2022-11-16 DIAGNOSIS — N183 Chronic kidney disease, stage 3 unspecified: Secondary | ICD-10-CM | POA: Diagnosis present

## 2022-12-19 ENCOUNTER — Other Ambulatory Visit: Payer: Self-pay | Admitting: Hematology and Oncology

## 2023-01-04 ENCOUNTER — Ambulatory Visit: Payer: Medicare PPO | Admitting: Urology

## 2023-01-04 ENCOUNTER — Encounter: Payer: Self-pay | Admitting: Urology

## 2023-01-04 VITALS — BP 142/74 | HR 93 | Ht 62.0 in

## 2023-01-04 DIAGNOSIS — N3941 Urge incontinence: Secondary | ICD-10-CM | POA: Diagnosis not present

## 2023-01-04 DIAGNOSIS — Z87440 Personal history of urinary (tract) infections: Secondary | ICD-10-CM

## 2023-01-04 LAB — URINALYSIS, ROUTINE W REFLEX MICROSCOPIC
Bilirubin, UA: NEGATIVE
Glucose, UA: NEGATIVE
Leukocytes,UA: NEGATIVE
Nitrite, UA: NEGATIVE
Protein,UA: NEGATIVE
RBC, UA: NEGATIVE
Specific Gravity, UA: 1.02 (ref 1.005–1.030)
Urobilinogen, Ur: 0.2 mg/dL (ref 0.2–1.0)
pH, UA: 5.5 (ref 5.0–7.5)

## 2023-01-04 LAB — MICROSCOPIC EXAMINATION
Epithelial Cells (non renal): >10 /[HPF] — ABNORMAL HIGH (ref 0–10)
RBC, Urine: NONE SEEN /[HPF] (ref 0–2)

## 2023-01-04 MED ORDER — FOSFOMYCIN TROMETHAMINE 3 G PO PACK
PACK | ORAL | 3 refills | Status: DC
Start: 2023-01-04 — End: 2023-05-10

## 2023-01-04 NOTE — Progress Notes (Signed)
Assessment: 1. Urge incontinence   2. History of UTI    Plan: Continue Vesicare 10 mg in the PM  Continue vaginal hormone cream  Continue fosfomycin 3 g every 10 days for UTI prevention. Return to office in 3 months  Chief Complaint:  Chief Complaint  Patient presents with   Urinary Incontinence    History of Present Illness:  Heidi Lutz is a 82 y.o. female who is seen for continued evaluation of her incontinence and history of UTIs. She was previously followed in [facility] and was last seen by me there in April 2022. She continued on Vesicare 10 mg daily.  She reported some worsening incontinence, primarily at night.  She also noted a pressure in the bladder area with sitting and at times while lying down.  No noticeable vaginal bulge.  She continue to use vaginal hormone cream 2-3 times per week.  She was also taking ciprofloxacin 250 mg daily per her PCP, Dr. Hal Hope.  No recent urine culture results available.  She continued with symptoms of urgency, nocturia, urge incontinence.  No dysuria or gross hematuria. Urine culture from 06/20/2022 grew <10K colonies of mixed flora. At her visit in 5/24, she continued to have some intermittent dysuria.  She continued with constant pressure in the bladder area, increased with the need to void.  She was taking the Vesicare at night and had noted an improvement in her incontinence. Resolve MDX culture from 06/23/2022 grew Enterococcus and Staphylococcus.  She was treated with Macrobid x 7 days. She noted improvement in her symptoms after completing the Macrobid.  She had 2 episodes of UTI symptoms which resolved spontaneously. She continued on Solifenacin.  No recent dysuria or gross hematuria. Urinalysis from 07/19/2022 again showed pyuria.  Cath urine confirmed presence of pyuria.  Pelvic exam demonstrated atrophic changes and a grade 2 cystocele.  Residual was 15 mL. Urine culture grew 25-50 K E. coli on cath specimen.  She was treated  with Cipro x 5 days and started on fosfomycin 3 g every 10 days for UTI prevention.  At her visit in June 2024, she was not having any UTI symptoms.  She continued on solifenacin with good control of her urinary symptoms.  She had not started the fosfomycin.  She returns today for follow-up.  She reports that she is doing very well from a urinary standpoint.  No recent UTI symptoms.  She continues to use the fosfomycin every 10 days.  She reports that her incontinence is not a problem for her.  She does have occasional incontinence but reports it is "not bad".  She continues on Solifenacin and vaginal hormone cream.  No dysuria or gross hematuria.   Urologic history: At her initial visit in January 2022, she reported symptoms of bladder pressure, frequency, urgency, and nocturia x 2.  She also reported occasional dysuria.  She was having urge incontinence and using 3-6 pads per day.  Her last UTI was approximately 6 months prior.  She was not having any UTI symptoms.  No history of fecal incontinence.  No prior medical therapy for her urinary symptoms.  She was given a trial of Myrbetriq and instructed on a bladder diet.  She was subsequently changed to Vesicare 10 mg daily.  At her follow-up in April 2022 she was taking both Vesicare and Myrbetriq.  Her urinary symptoms were improved.  She was not having any incontinence.  No UTI symptoms.  It was recommended that she discontinue the Myrbetriq and  continue Vesicare. She was subsequently seen by Dr. Gretchen Short.  She was started on vaginal hormone cream.  Her primary care doctor had started her on low-dose ciprofloxacin for prophylaxis for UTIs.  Vesicare was working well for her at the time of her last visit in October 2023.  Urine culture results: 1/22 50-100 K E. Coli 2/22 >100 K Enterococcus  Portions of the above documentation were copied from a prior visit for review purposes only.   Past Medical History:  Past Medical History:  Diagnosis Date    Age-related osteoporosis without current pathological fracture 10/31/2019   Allergic rhinitis 07/31/2007   Overview:   S/p allergy shots, 20 years ago Dr. Michaela Corner     10/1 IMO update   Basal cell carcinoma 12/15/2014   Overview:   Managed by Dr. Sharyn Lull, derm   Cancer [facility]) 01/2017   left breast cancer, basal cell and 1 melanoma   Cervical spondylosis 07/13/2022   Closed compression fracture of L1 lumbar vertebra, with routine healing, subsequent encounter 11/26/2013   Overview:   Normal DEXA 2015     Last Assessment & Plan:   New order for DEXA written today. Patient is getting epidural steroid injections with pain management  Overview:   Overview:   Normal DEXA 2015     Last Assessment & Plan:   continued improvement in pain, will refill tramadol and robaxin today.  Will return to PCP for follow-up   Complication of anesthesia    heart rate spikeed in the PACU on her 2nd knee surgery   Environmental allergies    Essential hypertension 09/19/2008   Last Assessment & Plan:   Blood pressure is elevated today. I reviewed her last few pain management notes her blood pressure was normal. Patient is asymptomatic and I suspect today's blood pressure is an outlier. She will continue home monitoring at her offices and will return for further evaluation if remains elevated   GAD (generalized anxiety disorder) 04/25/2017   Last Assessment & Plan:   See a/p above   High cholesterol    History of UTI 05/25/2022   History of vertebral compression fracture 07/16/2019   Hyperlipidemia 04/22/2009   Last Assessment & Plan:   Compliant with statin, continue   IBS (irritable bowel syndrome) 08/18/2006   Overview:   with IBS        Last Assessment & Plan:   Refill Bentyl for when necessary use. No evidence of infectious or inflammatory, or malignancy symptoms. Advised to follow-up for further evaluation if more frequent or new symptoms   Insomnia 02/02/2010   Last Assessment & Plan:   Refilled Ambien  quantity #30 for the next calendar year. She denies excessive sedation and is aware to not drive when taking medication.   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  Will start Lexapro today.  We discussed interim use of benzodiazepines for pre-procedural or severe episodes.  She reports she has had panic attacks in the past but has not had them recently.  She never took diazepam that was previously prescribed for a procedure.  We discussed   Malignant neoplasm of central portion of left breast in female, estrogen receptor positive (HCC) 03/16/2017   Mixed hyperlipidemia 03/22/2017   OAB (overactive bladder) 02/2020   Osteoarthritis  Osteoarthritis of spine with radiculopathy, lumbar region 02/27/2015   Osteopenia 03/03/2017   Overview:   DEXA 02/2017   Personal history of radiation therapy    Pleuritic chest pain 04/10/2018   PONV (postoperative nausea and vomiting)    with 1st knee surgery had n/v, none since   Pre-operative cardiovascular examination 03/22/2017   Raynaud disease    Raynauds disease-takes procardia   Raynaud's disease 08/18/2006   Rheumatoid arthritis (HCC)    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 Valentina Lucks -- [place], [place]   Urge incontinence 05/25/2022    Past Surgical History:  Past Surgical History:  Procedure Laterality Date   ABDOMINAL HYSTERECTOMY  1972   APPENDECTOMY  1953   BREAST BIOPSY Left 05/19/2020   benign   BREAST LUMPECTOMY Left 2019   BREAST LUMPECTOMY WITH RADIOACTIVE SEED AND SENTINEL LYMPH NODE BIOPSY Left 04/07/2017   Procedure: BREAST LUMPECTOMY WITH RADIOACTIVE SEED AND SENTINEL LYMPH NODE BIOPSY;  Surgeon: Harriette Bouillon, MD;  Location: [facility] OR;  Service: General;  Laterality: Left;   BUNIONECTOMY  1999   COLONOSCOPY     EYE SURGERY     bil cataract   GANGLION CYST EXCISION  1968   JOINT REPLACEMENT Left 2017    reverse shoulder replacement   REPLACEMENT TOTAL KNEE  2012, 2013   Right and Left   SHOULDER ARTHROSCOPY Left    SYMPATHECTOMY  1970   TONSILLECTOMY  1949    Allergies:  Allergies  Allergen Reactions   Fluorouracil Hives   Codeine Nausea And Vomiting   Hydrocodone Nausea And Vomiting   Penicillins Swelling, Rash and Other (See Comments)    Pt states allergic to all "cillin" drugs. Joint pain. Has patient had a PCN reaction causing immediate rash, facial/tongue/throat swelling, SOB or lightheadedness with hypotension: Yes Has patient had a PCN reaction causing severe rash involving mucus membranes or skin necrosis: No Has patient had a PCN reaction that required hospitalization: No Has patient had a PCN reaction occurring within the last 10 years: No If all of the above answers are "NO", then may proceed with Cephalosporin use.     Family History:  Family History  Adopted: Yes  Problem Relation Age of Onset   Breast cancer Neg Hx     Social History:  Social History   Tobacco Use   Smoking status: Former    Current packs/day: 0.00    Types: Cigarettes    Quit date: 02/22/1963    Years since quitting: 59.9    Passive exposure: Past   Smokeless tobacco: Never  Vaping Use   Vaping status:  Never Used  Substance Use Topics   Alcohol use: Yes    Alcohol/week: 14.0 standard drinks of alcohol    Types: 14 Glasses of wine per week    Comment: 2 glasses of wine daily.    Drug use: No    ROS: Constitutional:  Negative for fever,  chills, weight loss CV: Negative for chest pain, previous MI, hypertension Respiratory:  Negative for shortness of breath, wheezing, sleep apnea, frequent cough GI:  Negative for nausea, vomiting, bloody stool, GERD  Physical exam: BP (!) 142/74   Pulse 93   Ht 5\' 2"  (1.575 m)   LMP  (LMP Unknown)   BMI 22.50 kg/m  GENERAL APPEARANCE:  Well appearing, well developed, well nourished, NAD HEENT:  Atraumatic, normocephalic, oropharynx clear NECK:  Supple without lymphadenopathy or thyromegaly ABDOMEN:  Soft, non-tender, no masses EXTREMITIES:  Moves all extremities well, without clubbing, cyanosis, or edema NEUROLOGIC:  Alert and oriented x 3, normal gait, CN II-XII grossly intact MENTAL STATUS:  appropriate BACK:  Non-tender to palpation, No CVAT SKIN:  Warm, dry, and intact   Results: U/A: 0-5 WBC. >10 epithelial cells, few bacteria

## 2023-04-05 ENCOUNTER — Ambulatory Visit: Payer: Medicare PPO | Admitting: Urology

## 2023-04-05 NOTE — Progress Notes (Deleted)
 Assessment: 1. Urge incontinence   2. History of UTI     Plan: Continue Vesicare 10 mg in the PM  Continue vaginal hormone cream  Continue fosfomycin 3 g every 10 days for UTI prevention. Return to office in 3 months  Chief Complaint:  No chief complaint on file.   History of Present Illness:  Heidi Lutz is a 83 y.o. female who is seen for continued evaluation of her incontinence and history of UTIs. She was previously followed in [facility] and was last seen by me there in April 2022. She continued on Vesicare 10 mg daily.  She reported some worsening incontinence, primarily at night.  She also noted a pressure in the bladder area with sitting and at times while lying down.  No noticeable vaginal bulge.  She continue to use vaginal hormone cream 2-3 times per week.  She was also taking ciprofloxacin 250 mg daily per her PCP, Dr. Hal Hope.  No recent urine culture results available.  She continued with symptoms of urgency, nocturia, urge incontinence.  No dysuria or gross hematuria. Urine culture from 06/20/2022 grew <10K colonies of mixed flora. At her visit in 5/24, she continued to have some intermittent dysuria.  She continued with constant pressure in the bladder area, increased with the need to void.  She was taking the Vesicare at night and had noted an improvement in her incontinence. Resolve MDX culture from 06/23/2022 grew Enterococcus and Staphylococcus.  She was treated with Macrobid x 7 days. She noted improvement in her symptoms after completing the Macrobid.  She had 2 episodes of UTI symptoms which resolved spontaneously. She continued on Solifenacin.  No recent dysuria or gross hematuria. Urinalysis from 07/19/2022 again showed pyuria.  Cath urine confirmed presence of pyuria.  Pelvic exam demonstrated atrophic changes and a grade 2 cystocele.  Residual was 15 mL. Urine culture grew 25-50 K E. coli on cath specimen.  She was treated with Cipro x 5 days and started on  fosfomycin 3 g every 10 days for UTI prevention.  At her visit in June 2024, she was not having any UTI symptoms.  She continued on solifenacin with good control of her urinary symptoms.  She had not started the fosfomycin.  At her visit in November 2024, she was doing very well from a urinary standpoint.  No recent UTI symptoms.  She continued to use the fosfomycin every 10 days.  Her incontinence was not a problem for her.  She was having occasional incontinence but reports it is "not bad".  She continued on Solifenacin and vaginal hormone cream.  No dysuria or gross hematuria.   Urologic history: At her initial visit in January 2022, she reported symptoms of bladder pressure, frequency, urgency, and nocturia x 2.  She also reported occasional dysuria.  She was having urge incontinence and using 3-6 pads per day.  Her last UTI was approximately 6 months prior.  She was not having any UTI symptoms.  No history of fecal incontinence.  No prior medical therapy for her urinary symptoms.  She was given a trial of Myrbetriq and instructed on a bladder diet.  She was subsequently changed to Vesicare 10 mg daily.  At her follow-up in April 2022 she was taking both Vesicare and Myrbetriq.  Her urinary symptoms were improved.  She was not having any incontinence.  No UTI symptoms.  It was recommended that she discontinue the Myrbetriq and continue Vesicare. She was subsequently seen by Dr. Gretchen Short.  She  was started on vaginal hormone cream.  Her primary care doctor had started her on low-dose ciprofloxacin for prophylaxis for UTIs.  Vesicare was working well for her at the time of her last visit in October 2023.  Urine culture results: 1/22 50-100 K E. Coli 2/22 >100 K Enterococcus  Portions of the above documentation were copied from a prior visit for review purposes only.   Past Medical History:  Past Medical History:  Diagnosis Date   Age-related osteoporosis without current pathological fracture  10/31/2019   Allergic rhinitis 07/31/2007   Overview:   S/p allergy shots, 20 years ago Dr. Michaela Corner     10/1 IMO update   Basal cell carcinoma 12/15/2014   Overview:   Managed by Dr. Sharyn Lull, derm   Cancer [facility]) 01/2017   left breast cancer, basal cell and 1 melanoma   Cervical spondylosis 07/13/2022   Closed compression fracture of L1 lumbar vertebra, with routine healing, subsequent encounter 11/26/2013   Overview:   Normal DEXA 2015     Last Assessment & Plan:   New order for DEXA written today. Patient is getting epidural steroid injections with pain management  Overview:   Overview:   Normal DEXA 2015     Last Assessment & Plan:   continued improvement in pain, will refill tramadol and robaxin today.  Will return to PCP for follow-up   Complication of anesthesia    heart rate spikeed in the PACU on her 2nd knee surgery   Environmental allergies    Essential hypertension 09/19/2008   Last Assessment & Plan:   Blood pressure is elevated today. I reviewed her last few pain management notes her blood pressure was normal. Patient is asymptomatic and I suspect today's blood pressure is an outlier. She will continue home monitoring at her offices and will return for further evaluation if remains elevated   GAD (generalized anxiety disorder) 04/25/2017   Last Assessment & Plan:   See a/p above   High cholesterol    History of UTI 05/25/2022   History of vertebral compression fracture 07/16/2019   Hyperlipidemia 04/22/2009   Last Assessment & Plan:   Compliant with statin, continue   IBS (irritable bowel syndrome) 08/18/2006   Overview:   with IBS        Last Assessment & Plan:   Refill Bentyl for when necessary use. No evidence of infectious or inflammatory, or malignancy symptoms. Advised to follow-up for further evaluation if more frequent or new symptoms   Insomnia 02/02/2010   Last Assessment & Plan:   Refilled Ambien quantity #30 for the next calendar year. She denies excessive  sedation and is aware to not drive when taking medication.   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  Will start Lexapro today.  We discussed interim use of benzodiazepines for pre-procedural or severe episodes.  She reports she has had panic attacks in the past but has not had them recently.  She never took diazepam that was previously prescribed for a procedure.  We discussed   Malignant neoplasm of central portion of left breast in female, estrogen receptor positive (HCC) 03/16/2017   Mixed hyperlipidemia 03/22/2017   OAB (overactive bladder) 02/2020   Osteoarthritis    Osteoarthritis of spine with radiculopathy, lumbar region 02/27/2015  Osteopenia 03/03/2017   Overview:   DEXA 02/2017   Personal history of radiation therapy    Pleuritic chest pain 04/10/2018   PONV (postoperative nausea and vomiting)    with 1st knee surgery had n/v, none since   Pre-operative cardiovascular examination 03/22/2017   Raynaud disease    Raynauds disease-takes procardia   Raynaud's disease 08/18/2006   Rheumatoid arthritis (HCC)    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 Valentina Lucks -- [place], [place]    Urge incontinence 05/25/2022    Past Surgical History:  Past Surgical History:  Procedure Laterality Date   ABDOMINAL HYSTERECTOMY  1972   APPENDECTOMY  1953   BREAST BIOPSY Left 05/19/2020   benign   BREAST LUMPECTOMY Left 2019   BREAST LUMPECTOMY WITH RADIOACTIVE SEED AND SENTINEL LYMPH NODE BIOPSY Left 04/07/2017   Procedure: BREAST LUMPECTOMY WITH RADIOACTIVE SEED AND SENTINEL LYMPH NODE BIOPSY;  Surgeon: Harriette Bouillon, MD;  Location: [facility] OR;  Service: General;  Laterality: Left;   BUNIONECTOMY  1999   COLONOSCOPY     EYE SURGERY     bil cataract   GANGLION CYST EXCISION  1968   JOINT REPLACEMENT Left 2017    reverse shoulder replacement   REPLACEMENT TOTAL KNEE  2012, 2013   Right and Left   SHOULDER ARTHROSCOPY Left    SYMPATHECTOMY  1970   TONSILLECTOMY  1949    Allergies:  Allergies  Allergen Reactions   Fluorouracil Hives   Codeine Nausea And Vomiting   Hydrocodone Nausea And Vomiting   Penicillins Swelling, Rash and Other (See Comments)    Pt states allergic to all "cillin" drugs. Joint pain. Has patient had a PCN reaction causing immediate rash, facial/tongue/throat swelling, SOB or lightheadedness with hypotension: Yes Has patient had a PCN reaction causing severe rash involving mucus membranes or skin necrosis: No Has patient had a PCN reaction that required hospitalization: No Has patient had a PCN reaction occurring within the last 10 years: No If all of the above answers are "NO", then may proceed with Cephalosporin use.     Family History:  Family History  Adopted: Yes  Problem Relation Age of Onset   Breast cancer Neg Hx     Social History:  Social History   Tobacco Use   Smoking status: Former    Current packs/day: 0.00    Types: Cigarettes    Quit date: 02/22/1963    Years since quitting: 60.1    Passive exposure: Past   Smokeless tobacco: Never  Vaping Use   Vaping status: Never Used  Substance Use Topics   Alcohol use: Yes     Alcohol/week: 14.0 standard drinks of alcohol    Types: 14 Glasses of wine per week    Comment: 2 glasses of wine daily.    Drug use: No    ROS: Constitutional:  Negative for fever, chills, weight loss CV: Negative for chest pain, previous MI,  hypertension Respiratory:  Negative for shortness of breath, wheezing, sleep apnea, frequent cough GI:  Negative for nausea, vomiting, bloody stool, GERD  Physical exam: LMP  (LMP Unknown)  GENERAL APPEARANCE:  Well appearing, well developed, well nourished, NAD HEENT:  Atraumatic, normocephalic, oropharynx clear NECK:  Supple without lymphadenopathy or thyromegaly ABDOMEN:  Soft, non-tender, no masses EXTREMITIES:  Moves all extremities well, without clubbing, cyanosis, or edema NEUROLOGIC:  Alert and oriented x 3, normal gait, CN II-XII grossly intact MENTAL STATUS:  appropriate BACK:  Non-tender to palpation, No CVAT SKIN:  Warm, dry, and intact   Results: U/A:

## 2023-04-17 ENCOUNTER — Other Ambulatory Visit: Payer: Self-pay | Admitting: Family Medicine

## 2023-04-17 DIAGNOSIS — Z1231 Encounter for screening mammogram for malignant neoplasm of breast: Secondary | ICD-10-CM

## 2023-04-24 ENCOUNTER — Ambulatory Visit: Payer: Medicare PPO | Admitting: Hematology and Oncology

## 2023-05-09 ENCOUNTER — Inpatient Hospital Stay: Payer: Medicare PPO | Attending: Hematology and Oncology | Admitting: Hematology and Oncology

## 2023-05-09 VITALS — BP 119/63 | HR 108 | Temp 97.9°F

## 2023-05-09 DIAGNOSIS — N898 Other specified noninflammatory disorders of vagina: Secondary | ICD-10-CM | POA: Insufficient documentation

## 2023-05-09 DIAGNOSIS — C50112 Malignant neoplasm of central portion of left female breast: Secondary | ICD-10-CM | POA: Diagnosis not present

## 2023-05-09 DIAGNOSIS — J439 Emphysema, unspecified: Secondary | ICD-10-CM | POA: Insufficient documentation

## 2023-05-09 DIAGNOSIS — Z170 Estrogen receptor positive status [ER+]: Secondary | ICD-10-CM | POA: Insufficient documentation

## 2023-05-09 DIAGNOSIS — Z79810 Long term (current) use of selective estrogen receptor modulators (SERMs): Secondary | ICD-10-CM | POA: Diagnosis not present

## 2023-05-09 NOTE — Assessment & Plan Note (Signed)
 04/07/2017:left breast lumpectomy with sentinel lymph node sampling  pT1b pN0, stage IA invasive ductal carcinoma, grade II, with negative margins, total 1 lymph node removed from the axilla, ER 100%, PR 70%, HER2 negative ratio 1.39, Ki-67 2% adjuvant radiation 05/10/2017 - 06/06/2017   Current treatment: Tamoxifen started 03/17/2017 Plan of treatment: 10 years (patient preference)   Breast cancer surveillance: 1.  Breast MRI 11/11/2019: No evidence of malignancy  2. mammogram 05/27/2022: Benign breast density category C 3.  Breast exam 05/09/2023: Firmness in the left breast related to prior radiation scarring.  Otherwise no palpable lumps or nodules of concern.   Return to clinic in 1 year for follow-up

## 2023-05-09 NOTE — Progress Notes (Signed)
 Patient Care Team: Dois Davenport, MD as PCP - General (Family Medicine) Magrinat, Valentino Hue, MD (Inactive) as Consulting Physician (Oncology) Harriette Bouillon, MD as Consulting Physician (General Surgery)  DIAGNOSIS:  Encounter Diagnosis  Name Primary?   Malignant neoplasm of central portion of left breast in female, estrogen receptor positive (HCC) Yes    SUMMARY OF ONCOLOGIC HISTORY: Oncology History  Malignant neoplasm of central portion of left breast in female, estrogen receptor positive (HCC)  03/08/2017 Initial Diagnosis   Central left breast biopsy for a clinical T1b N0, stage IA i IDC, grade 1, e ER/PR positive, HER-2 not amplified, with an Ki-67 of 2%   03/16/2017 Cancer Staging   Staging form: Breast, AJCC 8th Edition - Clinical stage from 03/16/2017: Stage IA (cT1b, cN0, cM0, G1, ER+, PR+, HER2-) - Signed by Serena Croissant, MD on 04/19/2022 Method of lymph node assessment: Clinical Histologic grading system: 3 grade system Laterality: Left Tumor size (mm): 8   04/07/2017 Surgery   left breast lumpectomy with sentinel lymph node sampling  pT1b pN0, stage IA invasive ductal carcinoma, grade II, with negative margins, total 1 lymph node removed from the axilla     CHIEF COMPLIANT: Follow-up on tamoxifen  HISTORY OF PRESENT ILLNESS:   History of Present Illness The patient, with a history of breast cancer and emphysema, has been on tamoxifen for six years without any reported side effects. She has been compliant with her medication and plans to continue it for the recommended ten years. She has not experienced any hot flashes, muscle cramps, or mood swings beyond her usual baseline. She has been using estradiol cream for vaginal dryness, which has been effective. She reports discomfort in her breast, describing it as hard, but not painful. She expresses concern about the density of her breast tissue, which was noted in her last mammogram a year ago. She is due for another  mammogram in three weeks.     ALLERGIES:  is allergic to fluorouracil, codeine, hydrocodone, and penicillins.  MEDICATIONS:  Current Outpatient Medications  Medication Sig Dispense Refill   acetaminophen (TYLENOL) 500 MG tablet Take 1,000 mg by mouth every 6 (six) hours as needed for moderate pain or headache.      atorvastatin (LIPITOR) 20 MG tablet Take 20 mg by mouth daily.     Black Cohosh 540 MG CAPS Take 540 mg by mouth daily.      calcium gluconate 500 MG tablet Take 1 tablet (500 mg total) by mouth 2 (two) times daily.     cetirizine (ZYRTEC) 10 MG tablet Take 10 mg by mouth daily.     CRANBERRY PO Take 1 capsule by mouth daily.      denosumab (PROLIA) 60 MG/ML SOSY injection Inject 60 mg into the skin every 6 (six) months.     estradiol (ESTRACE) 0.1 MG/GM vaginal cream Place 1 Applicatorful vaginally at bedtime.     fosfomycin (MONUROL) 3 g PACK Take 1 packet by mouth every 10 days 9 g 3   Glucosamine-Chondroit-Vit C-Mn (GLUCOSAMINE-CHONDROITIN MAX ST) CAPS Take 1 capsule by mouth daily.      lidocaine (LIDODERM) 5 % APPLY 2 PATCHES TO SKIN EVERY DAY, REMOVE AND REPLACE PATCH AFTER 12 HRS 180 patch 0   losartan (COZAAR) 25 MG tablet Take 25 mg by mouth daily.     Magnesium 200 MG TABS Take 200 mg by mouth daily.      Multiple Vitamins-Minerals (CENTRUM ADULTS PO) Take 1 tablet by mouth daily.  NIFEdipine (PROCARDIA XL/ADALAT-CC) 90 MG 24 hr tablet Take 90 mg by mouth daily.      Probiotic Product (PROBIOTIC ADVANCED PO) Take 1 capsule by mouth daily.      solifenacin (VESICARE) 10 MG tablet Take 1 tablet (10 mg total) by mouth every evening. 30 tablet 11   tamoxifen (NOLVADEX) 20 MG tablet TAKE 1 TABLET EVERY DAY 90 tablet 3   traMADol (ULTRAM) 50 MG tablet Take 0.5 tablets (25 mg total) by mouth daily as needed. 30 tablet 0   No current facility-administered medications for this visit.    PHYSICAL EXAMINATION: ECOG PERFORMANCE STATUS: 1 - Symptomatic but completely  ambulatory  Vitals:   05/09/23 1158  BP: 119/63  Pulse: (!) 108  Temp: 97.9 F (36.6 C)  SpO2: 99%   There were no vitals filed for this visit.  Physical Exam BREAST: Breasts are firm and difficult to examine.  (exam performed in the presence of a chaperone)  LABORATORY DATA:  I have reviewed the data as listed    Latest Ref Rng & Units 04/19/2021   10:43 AM 04/15/2020   11:11 AM 04/15/2019   11:05 AM  CMP  Glucose 70 - 99 mg/dL 409  811  99   BUN 8 - 23 mg/dL 24  25  17    Creatinine 0.44 - 1.00 mg/dL 9.14  7.82  9.56   Sodium 135 - 145 mmol/L 141  138  143   Potassium 3.5 - 5.1 mmol/L 4.7  4.3  4.8   Chloride 98 - 111 mmol/L 106  102  104   CO2 22 - 32 mmol/L 26  25  27    Calcium 8.9 - 10.3 mg/dL 9.8  9.8  9.3   Total Protein 6.5 - 8.1 g/dL 7.3  7.2  7.2   Total Bilirubin 0.3 - 1.2 mg/dL 0.6  0.6  0.6   Alkaline Phos 38 - 126 U/L 36  47  51   AST 15 - 41 U/L 17  18  16    ALT 0 - 44 U/L 6  6  7      Lab Results  Component Value Date   WBC 6.8 04/19/2021   HGB 12.1 04/19/2021   HCT 35.8 (L) 04/19/2021   MCV 98.4 04/19/2021   PLT 239 04/19/2021   NEUTROABS 3.2 04/19/2021    ASSESSMENT & PLAN:  Malignant neoplasm of central portion of left breast in female, estrogen receptor positive (HCC) 04/07/2017:left breast lumpectomy with sentinel lymph node sampling  pT1b pN0, stage IA invasive ductal carcinoma, grade II, with negative margins, total 1 lymph node removed from the axilla, ER 100%, PR 70%, HER2 negative ratio 1.39, Ki-67 2% adjuvant radiation 05/10/2017 - 06/06/2017   Current treatment: Tamoxifen started 03/17/2017 Plan of treatment: 10 years (patient preference)   Breast cancer surveillance: 1.  Breast MRI 11/11/2019: No evidence of malignancy  2. mammogram 05/27/2022: Benign breast density category C 3.  Breast exam 05/09/2023: Firmness in the left breast related to prior radiation scarring.  Otherwise no palpable lumps or nodules of concern.   Return to clinic  in 1 year for follow-up ------------------------------------- Assessment and Plan Assessment & Plan Malignant neoplasm of central portion of left breast, estrogen receptor positive She has been on tamoxifen for six years with no significant side effects. Continuing tamoxifen for ten years is based on studies showing increased benefit with extended use. She is aware of potential risks, including blood clots, and the lack of evidence for benefit beyond  ten years. - Continue tamoxifen until 2029. - Switch upcoming mammogram to contrast-enhanced mammogram on April 9th. - Schedule follow-up in one year.  Breast tissue density Breast tissue is classified as category C (dense), complicating mammogram imaging. The new contrast-enhanced mammogram is expected to provide better imaging results.  Vaginal dryness She is using estradiol cream effectively.  Emphysema Diagnosis of emphysema made approximately a year ago.      Orders Placed This Encounter  Procedures   MM 2D DIAG BILAT WITH CONTRAST BCG ONLY    Standing Status:   Future    Expected Date:   05/31/2023    Expiration Date:   05/08/2024    Scheduling Instructions:     Please change her regular mammogram to [facility]    Reason for Exam (SYMPTOM  OR DIAGNOSIS REQUIRED):   high density annual mammogram    If indicated for the ordered procedure, I authorize the administration of contrast media per Radiology protocol:   Yes    Does the patient have a contrast media/X-ray dye allergy?:   No    [facility]    Release to patient:   Immediate   The patient has a good understanding of the overall plan. she agrees with it. she will call with any problems that may develop before the next visit here. Total time spent: 30 mins including face to face time and time spent for planning, charting and co-ordination of care   Tamsen Meek, MD 05/09/23

## 2023-05-10 ENCOUNTER — Encounter: Payer: Self-pay | Admitting: Urology

## 2023-05-10 ENCOUNTER — Ambulatory Visit: Payer: Medicare PPO | Admitting: Urology

## 2023-05-10 VITALS — BP 119/63 | Ht 62.5 in | Wt 113.1 lb

## 2023-05-10 DIAGNOSIS — N3941 Urge incontinence: Secondary | ICD-10-CM | POA: Diagnosis not present

## 2023-05-10 DIAGNOSIS — Z87440 Personal history of urinary (tract) infections: Secondary | ICD-10-CM

## 2023-05-10 LAB — URINALYSIS, ROUTINE W REFLEX MICROSCOPIC
Glucose, UA: NEGATIVE
Leukocytes,UA: NEGATIVE
Nitrite, UA: NEGATIVE
RBC, UA: NEGATIVE
Specific Gravity, UA: 1.02 (ref 1.005–1.030)
Urobilinogen, Ur: 0.2 mg/dL (ref 0.2–1.0)
pH, UA: 5.5 (ref 5.0–7.5)

## 2023-05-10 LAB — MICROSCOPIC EXAMINATION

## 2023-05-10 MED ORDER — SOLIFENACIN SUCCINATE 10 MG PO TABS: 10.0000 mg | ORAL_TABLET | Freq: Every evening | ORAL | 3 refills | Status: AC

## 2023-05-10 MED ORDER — FOSFOMYCIN TROMETHAMINE 3 G PO PACK: PACK | ORAL | 6 refills | Status: DC

## 2023-05-10 NOTE — Progress Notes (Addendum)
 Assessment: 1. Urge incontinence   2. History of UTI     Plan: Continue Vesicare 10 mg in the PM  Continue vaginal hormone cream  Continue fosfomycin 3 g every 10 days for UTI prevention. Return to office in 6 months  Chief Complaint:  Chief Complaint  Patient presents with   Urinary Incontinence    History of Present Illness:  Heidi Lutz is a 83 y.o. female who is seen for continued evaluation of her incontinence and history of UTIs. She was previously followed in [facility] and was last seen by me there in April 2022. She continued on Vesicare 10 mg daily.  She reported some worsening incontinence, primarily at night.  She also noted a pressure in the bladder area with sitting and at times while lying down.  No noticeable vaginal bulge.  She continue to use vaginal hormone cream 2-3 times per week.  She was also taking ciprofloxacin 250 mg daily per her PCP, Dr. Hal Hope.  No recent urine culture results available.  She continued with symptoms of urgency, nocturia, urge incontinence.  No dysuria or gross hematuria. Urine culture from 06/20/2022 grew <10K colonies of mixed flora. At her visit in 5/24, she continued to have some intermittent dysuria.  She continued with constant pressure in the bladder area, increased with the need to void.  She was taking the Vesicare at night and had noted an improvement in her incontinence. Resolve MDX culture from 06/23/2022 grew Enterococcus and Staphylococcus.  She was treated with Macrobid x 7 days. She noted improvement in her symptoms after completing the Macrobid.  She had 2 episodes of UTI symptoms which resolved spontaneously. She continued on Solifenacin.  No recent dysuria or gross hematuria. Urinalysis from 07/19/2022 again showed pyuria.  Cath urine confirmed presence of pyuria.  Pelvic exam demonstrated atrophic changes and a grade 2 cystocele.  Residual was 15 mL. Urine culture grew 25-50 K E. coli on cath specimen.  She was  treated with Cipro x 5 days and started on fosfomycin 3 g every 10 days for UTI prevention.  At her visit in June 2024, she was not having any UTI symptoms.  She continued on solifenacin with good control of her urinary symptoms.  She had not started the fosfomycin.  At her visit in 11/24, she was doing very well from a urinary standpoint.  No recent UTI symptoms.  She continued to use the fosfomycin every 10 days.  Her incontinence was not a problem for her.  She had occasional incontinence. She continued on Solifenacin and vaginal hormone cream.  No dysuria or gross hematuria.  She returns today for follow-up.  She continues to do very well on her current regimen of fosfomycin 3 g every 10 days.  She has not had any recent UTI symptoms.  She also continues on Solifenacin 10 mg nightly.  This is controlling her incontinence very well.  She is also using vaginal hormone cream 2 times per week.  Overall, she is very pleased with her current situation.  Urologic history: At her initial visit in January 2022, she reported symptoms of bladder pressure, frequency, urgency, and nocturia x 2.  She also reported occasional dysuria.  She was having urge incontinence and using 3-6 pads per day.  Her last UTI was approximately 6 months prior.  She was not having any UTI symptoms.  No history of fecal incontinence.  No prior medical therapy for her urinary symptoms.  She was given a trial of Myrbetriq  and instructed on a bladder diet.  She was subsequently changed to Vesicare 10 mg daily.  At her follow-up in April 2022 she was taking both Vesicare and Myrbetriq.  Her urinary symptoms were improved.  She was not having any incontinence.  No UTI symptoms.  It was recommended that she discontinue the Myrbetriq and continue Vesicare. She was subsequently seen by Dr. Gretchen Short.  She was started on vaginal hormone cream.  Her primary care doctor had started her on low-dose ciprofloxacin for prophylaxis for UTIs.  Vesicare  was working well for her at the time of her last visit in October 2023.  Urine culture results: 1/22 50-100 K E. Coli 2/22 >100 K Enterococcus  Portions of the above documentation were copied from a prior visit for review purposes only.   Past Medical History:  Past Medical History:  Diagnosis Date   Age-related osteoporosis without current pathological fracture 10/31/2019   Allergic rhinitis 07/31/2007   Overview:   S/p allergy shots, 20 years ago Dr. Michaela Corner     10/1 IMO update   Basal cell carcinoma 12/15/2014   Overview:   Managed by Dr. Sharyn Lull, derm   Cancer [facility]) 01/2017   left breast cancer, basal cell and 1 melanoma   Cervical spondylosis 07/13/2022   Closed compression fracture of L1 lumbar vertebra, with routine healing, subsequent encounter 11/26/2013   Overview:   Normal DEXA 2015     Last Assessment & Plan:   New order for DEXA written today. Patient is getting epidural steroid injections with pain management  Overview:   Overview:   Normal DEXA 2015     Last Assessment & Plan:   continued improvement in pain, will refill tramadol and robaxin today.  Will return to PCP for follow-up   Complication of anesthesia    heart rate spikeed in the PACU on her 2nd knee surgery   Environmental allergies    Essential hypertension 09/19/2008   Last Assessment & Plan:   Blood pressure is elevated today. I reviewed her last few pain management notes her blood pressure was normal. Patient is asymptomatic and I suspect today's blood pressure is an outlier. She will continue home monitoring at her offices and will return for further evaluation if remains elevated   GAD (generalized anxiety disorder) 04/25/2017   Last Assessment & Plan:   See a/p above   High cholesterol    History of UTI 05/25/2022   History of vertebral compression fracture 07/16/2019   Hyperlipidemia 04/22/2009   Last Assessment & Plan:   Compliant with statin, continue   IBS (irritable bowel syndrome)  08/18/2006   Overview:   with IBS        Last Assessment & Plan:   Refill Bentyl for when necessary use. No evidence of infectious or inflammatory, or malignancy symptoms. Advised to follow-up for further evaluation if more frequent or new symptoms   Insomnia 02/02/2010   Last Assessment & Plan:   Refilled Ambien quantity #30 for the next calendar year. She denies excessive sedation and is aware to not drive when taking medication.   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  Will start Lexapro today.  We discussed interim use of benzodiazepines for pre-procedural or severe episodes.  She reports she has had panic  attacks in the past but has not had them recently.  She never took diazepam that was previously prescribed for a procedure.  We discussed   Malignant neoplasm of central portion of left breast in female, estrogen receptor positive (HCC) 03/16/2017   Mixed hyperlipidemia 03/22/2017   OAB (overactive bladder) 02/2020   Osteoarthritis    Osteoarthritis of spine with radiculopathy, lumbar region 02/27/2015   Osteopenia 03/03/2017   Overview:   DEXA 02/2017   Personal history of radiation therapy    Pleuritic chest pain 04/10/2018   PONV (postoperative nausea and vomiting)    with 1st knee surgery had n/v, none since   Pre-operative cardiovascular examination 03/22/2017   Raynaud disease    Raynauds disease-takes procardia   Raynaud's disease 08/18/2006   Rheumatoid arthritis (HCC)    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 Valentina Lucks -- [place], [place]   Urge incontinence 05/25/2022    Past Surgical History:  Past Surgical History:  Procedure Laterality Date   ABDOMINAL HYSTERECTOMY  1972   APPENDECTOMY  1953   BREAST BIOPSY Left 05/19/2020   benign   BREAST LUMPECTOMY Left 2019   BREAST LUMPECTOMY WITH RADIOACTIVE SEED AND SENTINEL LYMPH NODE BIOPSY Left 04/07/2017   Procedure: BREAST LUMPECTOMY WITH RADIOACTIVE SEED AND SENTINEL LYMPH NODE BIOPSY;  Surgeon: Harriette Bouillon, MD;  Location: [facility] OR;  Service: General;  Laterality: Left;   BUNIONECTOMY  1999   COLONOSCOPY     EYE SURGERY     bil cataract   GANGLION CYST EXCISION  1968   JOINT REPLACEMENT Left 2017    reverse shoulder replacement   REPLACEMENT TOTAL KNEE  2012, 2013   Right and Left   SHOULDER ARTHROSCOPY Left    SYMPATHECTOMY  1970   TONSILLECTOMY  1949    Allergies:  Allergies  Allergen Reactions   Fluorouracil Hives   Codeine Nausea And Vomiting   Hydrocodone Nausea And Vomiting   Penicillins Swelling, Rash and Other (See Comments)    Pt states allergic to all "cillin" drugs. Joint pain. Has patient had a PCN reaction causing immediate rash, facial/tongue/throat swelling, SOB or lightheadedness with hypotension: Yes Has patient had a PCN reaction causing severe rash involving mucus membranes or skin necrosis: No Has patient had a PCN reaction that required hospitalization: No Has patient had a PCN reaction occurring within the last 10 years: No If all of the above answers are "NO", then may proceed with Cephalosporin use.     Family History:  Family History  Adopted: Yes  Problem  Relation Age of Onset   Breast cancer Neg Hx     Social History:  Social History   Tobacco Use   Smoking status: Former    Current packs/day: 0.00    Types: Cigarettes    Quit date: 02/22/1963    Years since quitting: 60.2    Passive exposure: Past   Smokeless tobacco: Never  Vaping  Use   Vaping status: Never Used  Substance Use Topics   Alcohol use: Yes    Alcohol/week: 14.0 standard drinks of alcohol    Types: 14 Glasses of wine per week    Comment: 2 glasses of wine daily.    Drug use: No    ROS: Constitutional:  Negative for fever, chills, weight loss CV: Negative for chest pain, previous MI, hypertension Respiratory:  Negative for shortness of breath, wheezing, sleep apnea, frequent cough GI:  Negative for nausea, vomiting, bloody stool, GERD  Physical exam: BP 119/63 Comment: pt reported  Ht 5' 2.5" (1.588 m)   Wt 113 lb 1.6 oz (51.3 kg)   LMP  (LMP Unknown)   BMI 20.36 kg/m  GENERAL APPEARANCE:  Well appearing, well developed, well nourished, NAD HEENT:  Atraumatic, normocephalic, oropharynx clear NECK:  Supple without lymphadenopathy or thyromegaly ABDOMEN:  Soft, non-tender, no masses EXTREMITIES:  Moves all extremities well, without clubbing, cyanosis, or edema NEUROLOGIC:  Alert and oriented x 3, normal gait, CN II-XII grossly intact MENTAL STATUS:  appropriate BACK:  Non-tender to palpation, No CVAT SKIN:  Warm, dry, and intact   Results: U/A:  0-5 WBC, 0-2 RBC, few bacteria

## 2023-05-31 ENCOUNTER — Ambulatory Visit
Admission: RE | Admit: 2023-05-31 | Discharge: 2023-05-31 | Disposition: A | Discharge status: Home / Self Care | Payer: Medicare PPO | Source: Ambulatory Visit | Attending: Family Medicine | Admitting: Family Medicine

## 2023-05-31 DIAGNOSIS — Z1231 Encounter for screening mammogram for malignant neoplasm of breast: Secondary | ICD-10-CM

## 2023-06-20 ENCOUNTER — Ambulatory Visit: Attending: Cardiology | Admitting: Cardiology

## 2023-06-20 ENCOUNTER — Encounter: Payer: Self-pay | Admitting: Cardiology

## 2023-06-20 VITALS — BP 124/62 | HR 88 | Ht 62.6 in | Wt 118.0 lb

## 2023-06-20 DIAGNOSIS — I700 Atherosclerosis of aorta: Secondary | ICD-10-CM | POA: Diagnosis not present

## 2023-06-20 DIAGNOSIS — I447 Left bundle-branch block, unspecified: Secondary | ICD-10-CM | POA: Diagnosis not present

## 2023-06-20 DIAGNOSIS — I10 Essential (primary) hypertension: Secondary | ICD-10-CM | POA: Diagnosis not present

## 2023-06-20 DIAGNOSIS — E782 Mixed hyperlipidemia: Secondary | ICD-10-CM

## 2023-06-20 NOTE — Patient Instructions (Signed)
 Medication Instructions:  Your physician recommends that you continue on your current medications as directed. Please refer to the Current Medication list given to you today.  *If you need a refill on your cardiac medications before your next appointment, please call your pharmacy*  Lab Work: None If you have labs (blood work) drawn today and your tests are completely normal, you will receive your results only by: MyChart Message (if you have MyChart) OR A paper copy in the mail If you have any lab test that is abnormal or we need to change your treatment, we will call you to review the results.  Testing/Procedures: None  Follow-Up: At [facility], you and your health needs are our priority.  As part of our continuing mission to provide you with exceptional heart care, our providers are all part of one team.  This team includes your primary Cardiologist (physician) and Advanced Practice Providers or APPs (Physician Assistants and Nurse Practitioners) who all work together to provide you with the care you need, when you need it.  Your next appointment:   9 month(s)  Provider:   Hillis Lu, MD    We recommend signing up for the patient portal called "MyChart".  Sign up information is provided on this After Visit Summary.  MyChart is used to connect with patients for Virtual Visits (Telemedicine).  Patients are able to view lab/test results, encounter notes, upcoming appointments, etc.  Non-urgent messages can be sent to your provider as well.   To learn more about what you can do with MyChart, go to [email or web address].   Other Instructions Please keep a BP log for 2 weeks and send by MyChart or mail.                        Name and DOB__________________________ Dr. Lafayette Pierre [address] [place], [place] [address]  Blood Pressure Record Sheet To take your blood pressure, you will need a blood pressure machine. You can buy a blood pressure machine (blood pressure  monitor) at your clinic, drug store, or online. When choosing one, consider: An automatic monitor that has an arm cuff. A cuff that wraps snugly around your upper arm. You should be able to fit only one finger between your arm and the cuff. A device that stores blood pressure reading results. Do not choose a monitor that measures your blood pressure from your wrist or finger. Follow your health care provider's instructions for how to take your blood pressure. To use this form: Get one reading in the morning (a.m.) 1-2 hours after you take any medicines. Get one reading in the evening (p.m.) before supper.   Blood pressure log Date: _______________________  a.m. _____________________(1st reading) HR___________            p.m. _____________________(2nd reading) HR__________  Date: _______________________  a.m. _____________________(1st reading) HR___________            p.m. _____________________(2nd reading) HR__________  Date: _______________________  a.m. _____________________(1st reading) HR___________            p.m. _____________________(2nd reading) HR__________  Date: _______________________  a.m. _____________________(1st reading) HR___________            p.m. _____________________(2nd reading) HR__________  Date: _______________________  a.m. _____________________(1st reading) HR___________            p.m. _____________________(2nd reading) HR__________  Date: _______________________  a.m. _____________________(1st reading) HR___________            p.m. _____________________(2nd reading) HR__________  Date: _______________________  a.m. _____________________(1st reading) HR___________            p.m. _____________________(2nd reading) HR__________   This information is not intended to replace advice given to you by your health care provider. Make sure you discuss any questions you have with your health care provider. Document Revised: 05/29/2019 Document  Reviewed: 05/29/2019 Elsevier Patient Education  2021 [employer].

## 2023-06-20 NOTE — Progress Notes (Signed)
 Cardiology Office Note:    Date:  06/20/2023   ID:  Heidi Lutz, Heidi Lutz 11-Nov-1940, MRN [id number]  PCP:  Allene Ivan, MD  Cardiologist:  Nelia Balzarine, MD   Referring MD: Allene Ivan, MD    ASSESSMENT:    1. Essential hypertension   2. Aortic atherosclerosis (HCC)   3. LBBB (left bundle branch block)   4. Mixed dyslipidemia    PLAN:    In order of problems listed above:  Primary prevention stressed with the patient.  Importance of compliance with diet medication stressed and patient verbalized standing. She was advised to ambulate to the best of her ability. Essential hypertension: Blood pressure is stable and diet was emphasized. Heart rate is elevated and she was advised to keep a track of her pulse blood pressure at home within the next 2 weeks and send it to us .  We will titrate medications if necessary. Mixed dyslipidemia: On lipid-lowering medications followed by primary care.  Goal LDL less than 70. Patient will be seen in follow-up appointment in 6 months or earlier if the patient has any concerns.    Medication Adjustments/Labs and Tests Ordered: Current medicines are reviewed at length with the patient today.  Concerns regarding medicines are outlined above.  No orders of the defined types were placed in this encounter.  No orders of the defined types were placed in this encounter.    No chief complaint on file.    History of Present Illness:    Heidi Lutz is a 83 y.o. female.  Patient has past medical history of aortic atherosclerosis,, essential hypertension and mixed dyslipidemia.  She denies any problems at this time and takes care of activities of daily living.  No chest pain orthopnea or PND.  She ambulates age appropriately.  They live in assisted living facility.  At the time of my evaluation, the patient is alert awake oriented and in no distress.  Past Medical History:  Diagnosis Date   Age-related osteoporosis without  current pathological fracture 10/31/2019   Allergic rhinitis 07/31/2007   Overview:   S/p allergy shots, 20 years ago Dr. Emmer Harlem     10/1 IMO update   Aortic atherosclerosis (HCC) 08/16/2022   Basal cell carcinoma 12/15/2014   Overview:   Managed by Dr. Dorisann Garre, derm   Cancer [facility]) 01/2017   left breast cancer, basal cell and 1 melanoma   Cervical spondylosis 07/13/2022   Closed compression fracture of L1 lumbar vertebra, with routine healing, subsequent encounter 11/26/2013   Overview:   Normal DEXA 2015     Last Assessment & Plan:   New order for DEXA written today. Patient is getting epidural steroid injections with pain management  Overview:   Overview:   Normal DEXA 2015     Last Assessment & Plan:   continued improvement in pain, will refill tramadol  and robaxin today.  Will return to PCP for follow-up   Complication of anesthesia    heart rate spikeed in the PACU on her 2nd knee surgery   Environmental allergies    Essential hypertension 09/19/2008   Last Assessment & Plan:   Blood pressure is elevated today. I reviewed her last few pain management notes her blood pressure was normal. Patient is asymptomatic and I suspect today's blood pressure is an outlier. She will continue home monitoring at her offices and will return for further evaluation if remains elevated   GAD (generalized anxiety disorder) 04/25/2017   Last Assessment & Plan:  See a/p above   High cholesterol    History of UTI 05/25/2022   History of vertebral compression fracture 07/16/2019   Hyperlipidemia 04/22/2009   Last Assessment & Plan:   Compliant with statin, continue   IBS (irritable bowel syndrome) 08/18/2006   Overview:   with IBS        Last Assessment & Plan:   Refill Bentyl for when necessary use. No evidence of infectious or inflammatory, or malignancy symptoms. Advised to follow-up for further evaluation if more frequent or new symptoms   Insomnia 02/02/2010   Last Assessment & Plan:   Refilled  Ambien quantity #30 for the next calendar year. She denies excessive sedation and is aware to not drive when taking medication.   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  Will start Lexapro today.  We discussed interim use of benzodiazepines for pre-procedural or severe episodes.  She reports she has had panic attacks in the past but has not had them recently.  She never took diazepam that was previously prescribed for a procedure.  We discussed   Malignant neoplasm of central portion of left breast in female, estrogen receptor positive (HCC) 03/16/2017   Mixed dyslipidemia 03/22/2017   Mixed hyperlipidemia 03/22/2017   OAB (overactive bladder) 02/2020   Osteoarthritis    Osteoarthritis of spine with radiculopathy, lumbar region 02/27/2015   Osteopenia 03/03/2017   Overview:   DEXA 02/2017   Personal history of radiation therapy    Pleuritic chest pain 04/10/2018   PONV (postoperative nausea and vomiting)    with 1st knee surgery had n/v, none since   Pre-operative cardiovascular examination 03/22/2017   Raynaud disease    Raynauds disease-takes procardia   Raynaud's disease 08/18/2006   Rheumatoid arthritis (HCC)    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 Manfred Seed -- [place], [place]   Urge incontinence 05/25/2022    Past Surgical History:  Procedure Laterality Date   ABDOMINAL HYSTERECTOMY  1972   APPENDECTOMY  1953   BREAST BIOPSY Left 05/19/2020   benign   BREAST LUMPECTOMY Left 2019   BREAST LUMPECTOMY WITH RADIOACTIVE SEED AND SENTINEL LYMPH NODE BIOPSY Left 04/07/2017   Procedure: BREAST LUMPECTOMY WITH RADIOACTIVE SEED AND SENTINEL LYMPH NODE BIOPSY;  Surgeon: Sim Dryer, MD;  Location: [facility] OR;  Service: General;  Laterality: Left;   BUNIONECTOMY  1999   COLONOSCOPY     EYE SURGERY     bil cataract   GANGLION CYST EXCISION  1968   JOINT REPLACEMENT Left 2017    reverse shoulder replacement   REPLACEMENT TOTAL KNEE  2012, 2013   Right and Left   SHOULDER ARTHROSCOPY Left    SYMPATHECTOMY  1970  TONSILLECTOMY  1949    Current Medications: Current Meds  Medication Sig   acetaminophen  (TYLENOL ) 500 MG tablet Take 1,000 mg by mouth every 6 (six) hours as needed for moderate pain or headache.    atorvastatin (LIPITOR) 20 MG tablet Take 20 mg by mouth daily.   Black Cohosh 540 MG CAPS Take 540 mg by mouth daily.    BREZTRI AEROSPHERE 160-9-4.8 MCG/ACT AERO inhaler Inhale 2 puffs into the lungs 2 (two) times daily.   calcium  gluconate 500 MG tablet Take 1 tablet (500 mg total) by mouth 2 (two) times daily.   cetirizine (ZYRTEC) 10 MG tablet Take 10 mg by mouth daily.   CRANBERRY PO Take 1 capsule by mouth daily.    denosumab (PROLIA) 60 MG/ML SOSY injection Inject 60 mg into the skin every 6 (six) months.   estradiol (ESTRACE) 0.1 MG/GM vaginal cream Place 1 Applicatorful vaginally at bedtime.   famotidine (PEPCID) 40 MG tablet Take 40 mg by mouth daily.   fosfomycin (MONUROL ) 3 g PACK Take 1 packet by mouth every 10 days   Glucosamine-Chondroit-Vit C-Mn (GLUCOSAMINE-CHONDROITIN MAX ST) CAPS Take 1 capsule by mouth daily.    lidocaine  (LIDODERM ) 5 %  APPLY 2 PATCHES TO SKIN EVERY DAY, REMOVE AND REPLACE PATCH AFTER 12 HRS   losartan (COZAAR) 25 MG tablet Take 25 mg by mouth daily.   Magnesium 200 MG TABS Take 200 mg by mouth daily.    multivitamin (RENA-VIT) TABS tablet Take 1 tablet by mouth daily.   NIFEdipine (PROCARDIA XL/ADALAT-CC) 90 MG 24 hr tablet Take 90 mg by mouth daily.    Probiotic Product (PROBIOTIC ADVANCED PO) Take 1 capsule by mouth daily.    solifenacin  (VESICARE ) 10 MG tablet Take 1 tablet (10 mg total) by mouth every evening.   tamoxifen  (NOLVADEX ) 20 MG tablet TAKE 1 TABLET EVERY DAY   traMADol  (ULTRAM ) 50 MG tablet Take 0.5 tablets (25 mg total) by mouth daily as needed.     Allergies:   Fluorouracil, Clavulanic acid, Codeine, Hydrocodone , and Penicillins   Social History   Socioeconomic History   Marital status: Married    Spouse name: Not on file   Number of children: Not on file   Years of education: Not on file   Highest education level: Not on file  Occupational History   Not on file  Tobacco Use   Smoking status: Former    Current packs/day: 0.00    Types: Cigarettes    Quit date: 02/22/1963    Years since quitting: 60.3    Passive exposure: Past   Smokeless tobacco: Never  Vaping Use   Vaping status: Never Used  Substance and Sexual Activity   Alcohol use: Yes    Alcohol/week: 14.0 standard drinks of alcohol    Types: 14 Glasses of wine per week    Comment: 2 glasses of wine daily.    Drug use: No   Sexual activity: Not on file  Other Topics Concern   Not on file  Social History Narrative   Not on file   Social Drivers of Health   Financial Resource Strain: Low Risk  (05/03/2021)   Received from [facility], [facility]   Overall Financial Resource Strain (CARDIA)    Difficulty of Paying Living Expenses: Not hard at all  Food Insecurity: Low Risk  (12/22/2022)   Received from [facility]   Hunger Vital Sign    Worried About Running Out of Food in the Last Year: Never true  Ran Out of Food in the Last Year: Never true  Transportation Needs: No Transportation Needs (12/22/2022)   Received from [facility]    In the past 12 months, has lack of reliable transportation kept you from medical appointments, meetings, work or from getting things needed for daily living? : No  Physical Activity: Insufficiently Active (05/03/2021)   Received from [facility], [facility]   Exercise Vital Sign    Days of Exercise per Week: 3 days    Minutes of Exercise per Session: 40 min  Stress: No Stress Concern Present (05/03/2021)   Received from [facility], [facility] - Occupational Stress Questionnaire    Feeling of Stress : Not at all  Social Connections: Unknown (05/06/2022)   Received from [facility], [facility]   Social Network    Social Network: Not on file     Family History: The patient's family history is negative for Breast cancer. She was adopted.  ROS:   Please see the history of present illness.    All other systems reviewed and are negative.  EKGs/Labs/Other Studies Reviewed:    The following studies were reviewed today: I discussed my findings with the patient at length   Recent Labs: No results found for requested labs within last 365 days.  Recent Lipid Panel No results found for: "CHOL", "TRIG", "HDL", "CHOLHDL", "VLDL", "LDLCALC", "LDLDIRECT"  Physical Exam:    VS:  BP 124/62   Pulse 88   Ht 5' 2.6" (1.59 m)   Wt 118 lb (53.5 kg)   LMP  (LMP Unknown)   SpO2 96%   BMI 21.17 kg/m     Wt Readings from Last 3 Encounters:  06/20/23 118 lb (53.5 kg)  05/10/23 113 lb 1.6 oz (51.3 kg)  10/04/22 123 lb (55.8 kg)     GEN: Patient is in no acute distress HEENT: Normal NECK: No JVD; No carotid bruits LYMPHATICS: No lymphadenopathy CARDIAC: Hear sounds regular, 2/6 systolic murmur at the apex. RESPIRATORY:  Clear to auscultation without rales, wheezing or rhonchi   ABDOMEN: Soft, non-tender, non-distended MUSCULOSKELETAL:  No edema; No deformity  SKIN: Warm and dry NEUROLOGIC:  Alert and oriented x 3 PSYCHIATRIC:  Normal affect   Signed, Nelia Balzarine, MD  06/20/2023 11:30 AM    [facility]

## 2023-07-12 ENCOUNTER — Encounter: Payer: Self-pay | Admitting: Cardiology

## 2023-08-17 ENCOUNTER — Other Ambulatory Visit: Payer: Self-pay | Admitting: Hematology and Oncology

## 2023-09-01 ENCOUNTER — Other Ambulatory Visit: Payer: Self-pay | Admitting: Neurosurgery

## 2023-09-01 DIAGNOSIS — M4722 Other spondylosis with radiculopathy, cervical region: Secondary | ICD-10-CM

## 2023-09-04 ENCOUNTER — Encounter: Payer: Self-pay | Admitting: Neurosurgery

## 2023-09-20 ENCOUNTER — Emergency Department (HOSPITAL_BASED_OUTPATIENT_CLINIC_OR_DEPARTMENT_OTHER)

## 2023-09-20 ENCOUNTER — Other Ambulatory Visit: Payer: Self-pay

## 2023-09-20 ENCOUNTER — Encounter (HOSPITAL_BASED_OUTPATIENT_CLINIC_OR_DEPARTMENT_OTHER): Payer: Self-pay

## 2023-09-20 ENCOUNTER — Emergency Department (HOSPITAL_BASED_OUTPATIENT_CLINIC_OR_DEPARTMENT_OTHER)
Admission: EM | Admit: 2023-09-20 | Discharge: 2023-09-20 | Disposition: A | Discharge status: Home / Self Care | Attending: Emergency Medicine | Admitting: Emergency Medicine

## 2023-09-20 DIAGNOSIS — M542 Cervicalgia: Secondary | ICD-10-CM | POA: Diagnosis present

## 2023-09-20 DIAGNOSIS — S40011A Contusion of right shoulder, initial encounter: Secondary | ICD-10-CM | POA: Insufficient documentation

## 2023-09-20 DIAGNOSIS — W19XXXA Unspecified fall, initial encounter: Secondary | ICD-10-CM

## 2023-09-20 DIAGNOSIS — W1839XA Other fall on same level, initial encounter: Secondary | ICD-10-CM | POA: Insufficient documentation

## 2023-09-20 DIAGNOSIS — Y9301 Activity, walking, marching and hiking: Secondary | ICD-10-CM | POA: Insufficient documentation

## 2023-09-20 DIAGNOSIS — I10 Essential (primary) hypertension: Secondary | ICD-10-CM | POA: Insufficient documentation

## 2023-09-20 DIAGNOSIS — S12100A Unspecified displaced fracture of second cervical vertebra, initial encounter for closed fracture: Secondary | ICD-10-CM | POA: Diagnosis not present

## 2023-09-20 DIAGNOSIS — S12000A Unspecified displaced fracture of first cervical vertebra, initial encounter for closed fracture: Secondary | ICD-10-CM | POA: Diagnosis not present

## 2023-09-20 DIAGNOSIS — D649 Anemia, unspecified: Secondary | ICD-10-CM | POA: Insufficient documentation

## 2023-09-20 DIAGNOSIS — E871 Hypo-osmolality and hyponatremia: Secondary | ICD-10-CM | POA: Insufficient documentation

## 2023-09-20 DIAGNOSIS — S0083XA Contusion of other part of head, initial encounter: Secondary | ICD-10-CM | POA: Insufficient documentation

## 2023-09-20 LAB — CBC WITH DIFFERENTIAL/PLATELET
Abs Immature Granulocytes: 0.04 K/uL (ref 0.00–0.07)
Basophils Absolute: 0.1 K/uL (ref 0.0–0.1)
Basophils Relative: 1 %
Eosinophils Absolute: 0.1 K/uL (ref 0.0–0.5)
Eosinophils Relative: 1 %
HCT: 21.6 % — ABNORMAL LOW (ref 36.0–46.0)
Hemoglobin: 7.2 g/dL — ABNORMAL LOW (ref 12.0–15.0)
Immature Granulocytes: 1 %
Lymphocytes Relative: 18 %
Lymphs Abs: 1.1 K/uL (ref 0.7–4.0)
MCH: 38.3 pg — ABNORMAL HIGH (ref 26.0–34.0)
MCHC: 33.3 g/dL (ref 30.0–36.0)
MCV: 114.9 fL — ABNORMAL HIGH (ref 80.0–100.0)
Monocytes Absolute: 1.2 K/uL — ABNORMAL HIGH (ref 0.1–1.0)
Monocytes Relative: 20 %
Neutro Abs: 3.5 K/uL (ref 1.7–7.7)
Neutrophils Relative %: 59 %
Platelets: 190 K/uL (ref 150–400)
RBC: 1.88 MIL/uL — ABNORMAL LOW (ref 3.87–5.11)
RDW: 15.9 % — ABNORMAL HIGH (ref 11.5–15.5)
WBC: 6 K/uL (ref 4.0–10.5)
nRBC: 0 % (ref 0.0–0.2)

## 2023-09-20 LAB — COMPREHENSIVE METABOLIC PANEL WITH GFR
ALT: 11 U/L (ref 0–44)
AST: 50 U/L — ABNORMAL HIGH (ref 15–41)
Albumin: 3.9 g/dL (ref 3.5–5.0)
Alkaline Phosphatase: 45 U/L (ref 38–126)
Anion gap: 13 (ref 5–15)
BUN: 23 mg/dL (ref 8–23)
CO2: 22 mmol/L (ref 22–32)
Calcium: 9 mg/dL (ref 8.9–10.3)
Chloride: 98 mmol/L (ref 98–111)
Creatinine, Ser: 1.41 mg/dL — ABNORMAL HIGH (ref 0.44–1.00)
GFR, Estimated: 37 mL/min — ABNORMAL LOW (ref >60)
Glucose, Bld: 115 mg/dL — ABNORMAL HIGH (ref 70–99)
Potassium: 4.6 mmol/L (ref 3.5–5.1)
Sodium: 133 mmol/L — ABNORMAL LOW (ref 135–145)
Total Bilirubin: 0.8 mg/dL (ref 0.0–1.2)
Total Protein: 6.9 g/dL (ref 6.5–8.1)

## 2023-09-20 LAB — IRON AND TIBC
Iron: 49 ug/dL (ref 28–170)
Saturation Ratios: 27 % (ref 10.4–31.8)
TIBC: 182 ug/dL — ABNORMAL LOW (ref 250–450)
UIBC: 133 ug/dL

## 2023-09-20 LAB — VITAMIN B12: Vitamin B-12: 1308 pg/mL — ABNORMAL HIGH (ref 180–914)

## 2023-09-20 LAB — RETICULOCYTES
Immature Retic Fract: 16.4 % — ABNORMAL HIGH (ref 2.3–15.9)
RBC.: 1.89 MIL/uL — ABNORMAL LOW (ref 3.87–5.11)
Retic Count, Absolute: 37.6 K/uL (ref 19.0–186.0)
Retic Ct Pct: 2 % (ref 0.4–3.1)

## 2023-09-20 LAB — CBG MONITORING, ED: Glucose-Capillary: 121 mg/dL — ABNORMAL HIGH (ref 70–99)

## 2023-09-20 LAB — FERRITIN: Ferritin: 494 ng/mL — ABNORMAL HIGH (ref 11–307)

## 2023-09-20 LAB — FOLATE: Folate: >40 ng/mL (ref >5.9)

## 2023-09-20 MED ORDER — FENTANYL CITRATE PF 50 MCG/ML IJ SOSY
50.0000 ug | PREFILLED_SYRINGE | Freq: Once | INTRAMUSCULAR | Status: AC
  Administered 2023-09-20: 50 ug via INTRAVENOUS
  Filled 2023-09-20: qty 1

## 2023-09-20 MED ORDER — TRAMADOL HCL 50 MG PO TABS: 50.0000 mg | ORAL_TABLET | Freq: Four times a day (QID) | ORAL | 0 refills | Status: DC | PRN

## 2023-09-20 NOTE — ED Provider Notes (Signed)
 [place] [facility] Provider Note   CSN: [id number] Arrival date & time: 09/20/23  1050     Patient presents with: Heidi Lutz is a 83 y.o. female presents today after a unwitnessed fall while walking to the bathroom approximately 1 week ago.  Patient is unsure how she fell or if she lost consciousness.  Patient reports bruising and hematoma to the right side of her face but denies pain in this area.  Patient reports pain in her middle and left neck.  Patient also reports pain in her right shoulder and abrasion.  Patient denies dizziness, diplopia, headache, numbness, weakness, chest pain, shortness of breath, abdominal pain, nausea, vomiting, diarrhea, or blood thinner use.    Fall       Prior to Admission medications   Medication Sig Start Date End Date Taking? Authorizing Provider  traMADol  (ULTRAM ) 50 MG tablet Take 1 tablet (50 mg total) by mouth every 6 (six) hours as needed. 09/20/23  Yes Magdalyn Arenivas N, PA-C  acetaminophen  (TYLENOL ) 500 MG tablet Take 1,000 mg by mouth every 6 (six) hours as needed for moderate pain or headache.     [provider]  atorvastatin (LIPITOR) 20 MG tablet Take 20 mg by mouth daily.    [provider]  Black Cohosh 540 MG CAPS Take 540 mg by mouth daily.     [provider]  BREZTRI AEROSPHERE 160-9-4.8 MCG/ACT AERO inhaler Inhale 2 puffs into the lungs 2 (two) times daily. 05/15/23   [provider]  calcium  gluconate 500 MG tablet Take 1 tablet (500 mg total) by mouth 2 (two) times daily. 04/19/21   Gudena, Vinay, MD  cetirizine (ZYRTEC) 10 MG tablet Take 10 mg by mouth daily.    [provider]  CRANBERRY PO Take 1 capsule by mouth daily.     [provider]  denosumab (PROLIA) 60 MG/ML SOSY injection Inject 60 mg into the skin every 6 (six) months.    [provider]  estradiol (ESTRACE) 0.1 MG/GM vaginal cream Place 1 Applicatorful  vaginally at bedtime. 06/26/22   [provider]  famotidine (PEPCID) 40 MG tablet Take 40 mg by mouth daily. 04/17/23   [provider]  fosfomycin (MONUROL ) 3 g PACK Take 1 packet by mouth every 10 days 05/10/23   Stoneking, Adine PARAS., MD  Glucosamine-Chondroit-Vit C-Mn (GLUCOSAMINE-CHONDROITIN MAX ST) CAPS Take 1 capsule by mouth daily.     [provider]  lidocaine  (LIDODERM ) 5 % APPLY 2 PATCHES TO SKIN EVERY DAY, REMOVE AND REPLACE PATCH AFTER 12 HRS 08/13/20   Raulkar, Krutika P, MD  losartan (COZAAR) 25 MG tablet Take 25 mg by mouth daily. 03/29/19   [provider]  Magnesium 200 MG TABS Take 200 mg by mouth daily.     [provider]  multivitamin (RENA-VIT) TABS tablet Take 1 tablet by mouth daily. 05/01/23   [provider]  NIFEdipine (PROCARDIA XL/ADALAT-CC) 90 MG 24 hr tablet Take 90 mg by mouth daily.     [provider]  Probiotic Product (PROBIOTIC ADVANCED PO) Take 1 capsule by mouth daily.     [provider]  solifenacin  (VESICARE ) 10 MG tablet Take 1 tablet (10 mg total) by mouth every evening. 05/10/23   Stoneking, Adine PARAS., MD  tamoxifen  (NOLVADEX ) 20 MG tablet TAKE 1 TABLET EVERY DAY 08/18/23   Gudena, Vinay, MD  traMADol  (ULTRAM ) 50 MG tablet Take 0.5 tablets (25 mg  total) by mouth daily as needed. 04/19/21   Gudena, Vinay, MD    Allergies: Fluorouracil, Clavulanic acid, Codeine, Hydrocodone , and Penicillins    Review of Systems  Musculoskeletal:  Positive for arthralgias and neck pain.    Updated Vital Signs BP (!) 120/53   Pulse 96   Temp 98 F (36.7 C) (Oral)   Resp 16   LMP  (LMP Unknown)   SpO2 93%   Physical Exam Vitals and nursing note reviewed.  Constitutional:      General: She is not in acute distress.    Appearance: She is well-developed.  HENT:     Head: Normocephalic.     Right Ear: External ear normal.     Left Ear: External ear normal.  Eyes:     Conjunctiva/sclera:  Conjunctivae normal.  Neck:     Comments: Tenderness to palpation of the midline neck and left paraspinal muscles without obvious deformity or step-off.  No ecchymosis or erythema noted on exam. Cardiovascular:     Rate and Rhythm: Normal rate and regular rhythm.     Pulses: Normal pulses.     Heart sounds: Normal heart sounds. No murmur heard. Pulmonary:     Effort: Pulmonary effort is normal. No respiratory distress.     Breath sounds: Normal breath sounds.  Abdominal:     Palpations: Abdomen is soft.     Tenderness: There is no abdominal tenderness.  Musculoskeletal:        General: No swelling.     Cervical back: Neck supple. Tenderness present.     Comments: Patient denies bony tenderness to palpation of the T-spine, L-spine, and sacral spine.  No ecchymosis, step-offs, deformity, or erythema noted on exam.  Skin:    General: Skin is warm and dry.     Capillary Refill: Capillary refill takes less than 2 seconds.     Findings: Bruising present.     Comments: Patient with extensive ecchymosis on the right side of her face without obvious deformity, large hematoma also noted to patient's right temple.  Bruising down the side of her right neck and into her shoulder where she has an abrasion.  Neurological:     General: No focal deficit present.     Mental Status: She is alert and oriented to person, place, and time.     Sensory: No sensory deficit.     Motor: No weakness.  Psychiatric:        Mood and Affect: Mood normal.     (all labs ordered are listed, but only abnormal results are displayed) Labs Reviewed  CBC WITH DIFFERENTIAL/PLATELET - Abnormal; Notable for the following components:      Result Value   RBC 1.88 (*)    Hemoglobin 7.2 (*)    HCT 21.6 (*)    MCV 114.9 (*)    MCH 38.3 (*)    RDW 15.9 (*)    Monocytes Absolute 1.2 (*)    All other components within normal limits  COMPREHENSIVE METABOLIC PANEL WITH GFR - Abnormal; Notable for the following components:    Sodium 133 (*)    Glucose, Bld 115 (*)    Creatinine, Ser 1.41 (*)    AST 50 (*)    GFR, Estimated 37 (*)    All other components within normal limits  RETICULOCYTES - Abnormal; Notable for the following components:   RBC. 1.89 (*)    Immature Retic Fract 16.4 (*)    All other components within normal limits  CBG  MONITORING, ED - Abnormal; Notable for the following components:   Glucose-Capillary 121 (*)    All other components within normal limits  URINALYSIS, ROUTINE W REFLEX MICROSCOPIC  VITAMIN B12  FOLATE  IRON AND TIBC  FERRITIN    EKG: EKG Interpretation Date/Time:  Wednesday September 20 2023 11:20:55 EDT Ventricular Rate:  95 PR Interval:  209 QRS Duration:  136 QT Interval:  381 QTC Calculation: 479 R Axis:   -36  Text Interpretation: Sinus rhythm Borderline prolonged PR interval Consider right atrial enlargement Left bundle branch block Confirmed by Ruthe Cornet [phone]) on 09/20/2023 11:22:38 AM  Radiology: CT CERVICAL SPINE WO CONTRAST Addendum Date: 09/20/2023 ADDENDUM REPORT: 09/20/2023 12:26 ADDENDUM: Study discussed by telephone with Ruthe Cornet, DO on 09/20/2023 at 1212 hours. Electronically Signed   By: VEAR Hurst M.D.   On: 09/20/2023 12:26   Result Date: 09/20/2023 CLINICAL DATA:  83 year old female status post fall while walking to bathroom 1 week ago. Continued pain and bruising. EXAM: CT CERVICAL SPINE WITHOUT CONTRAST TECHNIQUE: Multidetector CT imaging of the cervical spine was performed without intravenous contrast. Multiplanar CT image reconstructions were also generated. RADIATION DOSE REDUCTION: This exam was performed according to the departmental dose-optimization program which includes automated exposure control, adjustment of the mA and/or kV according to patient size and/or use of iterative reconstruction technique. COMPARISON:  Head and face CT today reported separately. FINDINGS: Alignment: Abnormal C1-C2 alignment, detailed below. Underlying  dextroconvex upper and levoconvex lower cervical scoliosis. Degenerative anterolisthesis of C3 on C4 measuring up to 5 mm. 3 mm of degenerative anterolisthesis of C4 on C5 and C7 on T1. Skull base and vertebrae: Visualized skull base is intact. No atlanto-occipital dissociation. Oblique minimally displaced fracture through the right anterior C1 ring on series 609, image 17 and coronal image 47. Superimposed comminuted type 2 odontoid fracture, left lateral displaced odontoid by 3-4 mm (coronal image 45). Similar anterior displacement of the odontoid fragment by up to 3 mm. Anterior C1-odontoid alignment relatively maintained. C2 body, pedicles and posterior elements intact. Superimposed degenerative bilateral C2-C3 facet ankylosis. No other acute cervical spine fracture identified, but additional degenerative cervical ankylosis detailed below. Soft tissues and spinal canal: No visible canal hematoma. Mild prevertebral soft tissue swelling at the C2 level on series 4, image 24. Disc levels: Degenerative ankylosis of C2-C3, C5-C6, and developing at C6-C7 and C7-T1. Degenerative spondylolisthesis of the unfused C4-C5 level, and also C3-C4 - although evidence of developing right side facet ankylosis at that level also. Upper chest: Advanced upper thoracic spine disc and endplate degeneration. Visible upper thoracic levels appear intact. Negative lung apices. Calcified aortic atherosclerosis. IMPRESSION: 1. Positive for acute anterior C1 ring and type 2 Odontoid fractures, the latter displaced up to 3 mm. 2. No other acute cervical spine fracture identified. Underlying widespread degenerative cervical spinal ankylosis, and degenerative spondylolisthesis at the levels not currently fused. 3. Head and face CT reported separately. Electronically Signed: By: VEAR Hurst M.D. On: 09/20/2023 12:07   DG Shoulder Right Result Date: 09/20/2023 CLINICAL DATA:  83 year old female status post fall while walking to bathroom 1 week  ago. Continued pain and bruising. EXAM: RIGHT SHOULDER - 2+ VIEW COMPARISON:  CT chest 04/20/2022. FINDINGS: Three views 1148 hours. Superior subluxation of the right humeral head. No glenohumeral joint dislocation. Proximal right humerus intact. Advanced degeneration also at the right [facility] joint. But the right clavicle and scapula appear to be intact. Negative visible right lung. No acute right rib fracture identified. IMPRESSION: Chronic  severe degeneration at the right shoulder but no acute fracture or dislocation identified. Electronically Signed   By: VEAR Hurst M.D.   On: 09/20/2023 12:09   CT Maxillofacial Wo Contrast Result Date: 09/20/2023 CLINICAL DATA:  83 year old female status post fall while walking to bathroom 1 week ago. Continued pain and bruising. EXAM: CT MAXILLOFACIAL WITHOUT CONTRAST TECHNIQUE: Multidetector CT imaging of the maxillofacial structures was performed. Multiplanar CT image reconstructions were also generated. RADIATION DOSE REDUCTION: This exam was performed according to the departmental dose-optimization program which includes automated exposure control, adjustment of the mA and/or kV according to patient size and/or use of iterative reconstruction technique. COMPARISON:  Head and cervical spine today reported separately. FINDINGS: Osseous: Upper cervical spine fractures reported separately. Mandible intact and normally located. TMJ degeneration greater on the left. No acute dental finding identified. Bilateral maxilla, zygoma, pterygoid, nasal bones appear intact. Visible calvarium intact. Visible central skull base intact. Orbits: Intact orbital walls. Postoperative changes to both globes. Orbits soft tissues otherwise normal. Sinuses: Visualized paranasal sinuses and mastoids are clear. Soft tissues: Negative visible noncontrast larynx, pharynx, parapharyngeal spaces, sublingual space, submandibular spaces, masticator and parotid spaces. Large right lateral face/lateral orbit  intermediate to hyperdense subcutaneous mass overlying the lower right temporalis muscle is up to 4 cm in length and 15 mm in thickness. This is nonspecific but hematoma is favored. Limited intracranial: Reported separately. IMPRESSION: 1. Upper Cervical Spine Fractures, reported separately. 2. Large up to 4 cm length, 1.5 cm thick subcutaneous mass overlying the lower right temporalis area is nonspecific but favor Hematoma. 3. No acute facial fracture identified. Electronically Signed   By: VEAR Hurst M.D.   On: 09/20/2023 12:01   CT HEAD WO CONTRAST Result Date: 09/20/2023 CLINICAL DATA:  83 year old female status post fall while walking to bathroom 1 week ago. Continued pain and bruising. EXAM: CT HEAD WITHOUT CONTRAST TECHNIQUE: Contiguous axial images were obtained from the base of the skull through the vertex without intravenous contrast. RADIATION DOSE REDUCTION: This exam was performed according to the departmental dose-optimization program which includes automated exposure control, adjustment of the mA and/or kV according to patient size and/or use of iterative reconstruction technique. COMPARISON:  Face and cervical spine CT today reported separately. FINDINGS: Brain: No midline shift, ventriculomegaly, mass effect, evidence of mass lesion, intracranial hemorrhage or evidence of cortically based acute infarction. Patchy mild to moderate for age bilateral cerebral white matter hypodensity. No cortical encephalomalacia identified. Vascular: Calcified atherosclerosis at the skull base. No suspicious intracranial vascular hyperdensity. Skull: Upper cervical spine fractures, see cervical spine CT reported separately. No skull fracture identified. Face CT also reported separately. Sinuses/Orbits: Visualized paranasal sinuses and mastoids are clear. Other: Large right lateral orbit, temporalis region up to 15 mm thick hyperdense mass or collection in the subcutaneous soft tissues, favor hematoma in this setting.  Orbits soft tissues appear negative. IMPRESSION: 1. Cervical spine fractures, see Cervical Spine CT reported separately. 2. Large hyperdense subcutaneous mass overlying the right temporalis, favor hematoma. No skull fracture identified. 3. No acute intracranial abnormality. Electronically Signed   By: VEAR Hurst M.D.   On: 09/20/2023 11:58     Procedures   Medications Ordered in the ED  fentaNYL  (SUBLIMAZE ) injection 50 mcg (50 mcg Intravenous Given 09/20/23 1329)                                    Medical Decision Making  Amount and/or Complexity of Data Reviewed Labs: ordered. Radiology: ordered.  Risk Prescription drug management.   This patient presents to the ED for concern of fall, this involves an extensive number of treatment options, and is a complaint that carries with it a high risk of complications and morbidity.  The differential diagnosis includes musculoskeletal pain, hematoma, shoulder fracture, facial bone fracture, C-spine injury, anemia, arrhythmia, electrolyte abnormality, UTI, hypoglycemia   Co morbidities / Chronic conditions that complicate the patient evaluation  RA, osteoarthritis, hypertension, LBBB   Additional history obtained:  Additional history obtained from EMR External records from outside source obtained and reviewed including Care Everywhere   Lab Tests:  I Ordered, and personally interpreted labs.  The pertinent results include: Anemia at 7.2, mild hyponatremia at 133, elevated creatinine 1.41 which is chronic per historical values, elevated AST at 50, mildly increased immature reticulocyte fraction at 16.4   Imaging Studies ordered:  I ordered imaging studies including CT head, maxillofacial, C-spine Noncon I independently visualized and interpreted imaging which showed acute anterior ring and type II odontoid fractures, latter displaced up to 3 mm.  No other acute cervical spine fractures identified.  Large hyperdense subcutaneous mass  overlying the right temporalis, favor hematoma.  No skull fractures identified.  No acute intracranial abnormality no acute facial fracture identified. I agree with the radiologist interpretation Right shoulder x-ray which showed no acute fracture or dislocation of the right shoulder   Cardiac Monitoring: / EKG:  The patient was maintained on a cardiac monitor.  I personally viewed and interpreted the cardiac monitored which showed an underlying rhythm of: Sinus rhythm, LBBB   Problem List / ED Course / Critical interventions / Medication management  I ordered medication including fentanyl  I have reviewed the patients home medicines and have made adjustments as needed Patient placed in c-collar   Consultations Obtained:  I requested consultation with the neurosurgery, Dr. Mavis, and discussed lab and imaging findings as well as pertinent plan - they recommend: Hard c-collar and follow-up in clinic, or they would see the patient in patient if admitted.   Test / Admission - Considered:  Discussed with patient and family admission for pain control and trending of hemoglobin.  Patient and family at this time would prefer discharge with oral pain control and close follow-up with PCP to trend hemoglobin and outpatient follow-up with neurosurgery.  Patient prescribed outpatient course of tramadol  for pain control.  Patient to follow-up with Dr. Burney and Dr. Mavis for further evaluation and treatment.  Patient given return precautions.  I feel patient is safe for discharge at this time.     Final diagnoses:  Fall, initial encounter  Closed displaced fracture of first cervical vertebra, unspecified fracture morphology, initial encounter (HCC)  Closed displaced fracture of second cervical vertebra, unspecified fracture morphology, initial encounter (HCC)  Anemia, unspecified type    ED Discharge Orders          Ordered    traMADol  (ULTRAM ) 50 MG tablet  Every 6 hours PRN         09/20/23 1348               Francis Ileana SAILOR, PA-C 09/20/23 1351    Ruthe Cornet, DO 09/20/23 1414

## 2023-09-20 NOTE — Discharge Instructions (Addendum)
 Today you were seen after a fall.  You were found to have C1 and C2 fractures.  Please keep your neck brace in place at all times.  Please pick up your pain medicine and take as prescribed.  Please be careful when ambulating on these medications as you may be more off balance and prone to falling.  You were also found to be anemic on your labs, please follow-up with your PCP so that these labs can be trended and further assessed.  Please follow-up with Dr. Mavis of neurosurgery for further evaluation and workup.  Please return to the ED if you have worsening pain, numbness, paralysis, or loss of bowel or bladder control.  Thank you for letting us  treat you today. After reviewing your labs and imaging, I feel you are safe to go home. Please follow up with your PCP in the next several days and provide them with your records from this visit. Return to the Emergency Room if pain becomes severe or symptoms worsen.

## 2023-09-20 NOTE — ED Triage Notes (Signed)
 Reports fall while walking to bathroom 1 week ago. Unknown how pt fell or if LOC Bruising and knot noted to R side of face.  Back of neck and L side of neck pain since.  Denies headache, dizziness, visual changes   Takes aspirin daily

## 2023-09-22 ENCOUNTER — Observation Stay (HOSPITAL_COMMUNITY)

## 2023-09-22 ENCOUNTER — Other Ambulatory Visit: Payer: Self-pay

## 2023-09-22 ENCOUNTER — Encounter (HOSPITAL_COMMUNITY): Payer: Self-pay

## 2023-09-22 ENCOUNTER — Observation Stay (HOSPITAL_COMMUNITY)
Admission: EM | Admit: 2023-09-22 | Discharge: 2023-09-24 | Disposition: A | Discharge status: Home / Self Care | Source: Ambulatory Visit | Attending: Family Medicine | Admitting: Family Medicine

## 2023-09-22 DIAGNOSIS — S12001A Unspecified nondisplaced fracture of first cervical vertebra, initial encounter for closed fracture: Secondary | ICD-10-CM | POA: Diagnosis not present

## 2023-09-22 DIAGNOSIS — D649 Anemia, unspecified: Secondary | ICD-10-CM | POA: Diagnosis not present

## 2023-09-22 DIAGNOSIS — R2681 Unsteadiness on feet: Secondary | ICD-10-CM | POA: Insufficient documentation

## 2023-09-22 DIAGNOSIS — W19XXXA Unspecified fall, initial encounter: Secondary | ICD-10-CM | POA: Diagnosis not present

## 2023-09-22 DIAGNOSIS — Z79899 Other long term (current) drug therapy: Secondary | ICD-10-CM | POA: Diagnosis not present

## 2023-09-22 DIAGNOSIS — N183 Chronic kidney disease, stage 3 unspecified: Secondary | ICD-10-CM | POA: Insufficient documentation

## 2023-09-22 DIAGNOSIS — S12100A Unspecified displaced fracture of second cervical vertebra, initial encounter for closed fracture: Secondary | ICD-10-CM

## 2023-09-22 DIAGNOSIS — M6281 Muscle weakness (generalized): Secondary | ICD-10-CM | POA: Diagnosis not present

## 2023-09-22 DIAGNOSIS — I129 Hypertensive chronic kidney disease with stage 1 through stage 4 chronic kidney disease, or unspecified chronic kidney disease: Secondary | ICD-10-CM | POA: Diagnosis not present

## 2023-09-22 DIAGNOSIS — Z853 Personal history of malignant neoplasm of breast: Secondary | ICD-10-CM | POA: Diagnosis not present

## 2023-09-22 LAB — ABO/RH: ABO/RH(D): O POS

## 2023-09-22 LAB — COMPREHENSIVE METABOLIC PANEL WITH GFR
ALT: 15 U/L (ref 0–44)
AST: 34 U/L (ref 15–41)
Albumin: 3.4 g/dL — ABNORMAL LOW (ref 3.5–5.0)
Alkaline Phosphatase: 41 U/L (ref 38–126)
Anion gap: 9 (ref 5–15)
BUN: 19 mg/dL (ref 8–23)
CO2: 24 mmol/L (ref 22–32)
Calcium: 8.8 mg/dL — ABNORMAL LOW (ref 8.9–10.3)
Chloride: 101 mmol/L (ref 98–111)
Creatinine, Ser: 1.21 mg/dL — ABNORMAL HIGH (ref 0.44–1.00)
GFR, Estimated: 44 mL/min — ABNORMAL LOW (ref >60)
Glucose, Bld: 145 mg/dL — ABNORMAL HIGH (ref 70–99)
Potassium: 4.2 mmol/L (ref 3.5–5.1)
Sodium: 134 mmol/L — ABNORMAL LOW (ref 135–145)
Total Bilirubin: 1.3 mg/dL — ABNORMAL HIGH (ref 0.0–1.2)
Total Protein: 7.2 g/dL (ref 6.5–8.1)

## 2023-09-22 LAB — CBC WITH DIFFERENTIAL/PLATELET
Abs Granulocyte: 3 K/uL (ref 1.5–6.5)
Abs Immature Granulocytes: 0.03 K/uL (ref 0.00–0.07)
Basophils Absolute: 0.1 K/uL (ref 0.0–0.1)
Basophils Relative: 1 %
Eosinophils Absolute: 0 K/uL (ref 0.0–0.5)
Eosinophils Relative: 1 %
HCT: 24.1 % — ABNORMAL LOW (ref 36.0–46.0)
Hemoglobin: 7.6 g/dL — ABNORMAL LOW (ref 12.0–15.0)
Immature Granulocytes: 1 %
Lymphocytes Relative: 20 %
Lymphs Abs: 1.1 K/uL (ref 0.7–4.0)
MCH: 37.1 pg — ABNORMAL HIGH (ref 26.0–34.0)
MCHC: 31.5 g/dL (ref 30.0–36.0)
MCV: 117.6 fL — ABNORMAL HIGH (ref 80.0–100.0)
Monocytes Absolute: 1.1 K/uL — ABNORMAL HIGH (ref 0.1–1.0)
Monocytes Relative: 21 %
Neutro Abs: 3 K/uL (ref 1.7–7.7)
Neutrophils Relative %: 56 %
Platelets: 229 K/uL (ref 150–400)
RBC: 2.05 MIL/uL — ABNORMAL LOW (ref 3.87–5.11)
RDW: 15.9 % — ABNORMAL HIGH (ref 11.5–15.5)
WBC: 5.3 K/uL (ref 4.0–10.5)
nRBC: 0 % (ref 0.0–0.2)

## 2023-09-22 LAB — POC OCCULT BLOOD, ED: Fecal Occult Bld: POSITIVE — AB

## 2023-09-22 MED ORDER — ONDANSETRON HCL 4 MG PO TABS: 4.0000 mg | ORAL_TABLET | Freq: Four times a day (QID) | ORAL | Status: DC | PRN

## 2023-09-22 MED ORDER — ACETAMINOPHEN 650 MG RE SUPP: 650.0000 mg | Freq: Four times a day (QID) | RECTAL | Status: DC | PRN

## 2023-09-22 MED ORDER — ACETAMINOPHEN 325 MG PO TABS: 650.0000 mg | ORAL_TABLET | Freq: Four times a day (QID) | ORAL | Status: DC | PRN

## 2023-09-22 MED ORDER — FAMOTIDINE 20 MG PO TABS
40.0000 mg | ORAL_TABLET | Freq: Every day | ORAL | Status: DC
  Administered 2023-09-22: 40 mg via ORAL
  Filled 2023-09-22: qty 2

## 2023-09-22 MED ORDER — TRAZODONE HCL 50 MG PO TABS
25.0000 mg | ORAL_TABLET | Freq: Every evening | ORAL | Status: DC | PRN
Start: 2023-09-22 — End: 2023-09-24

## 2023-09-22 MED ORDER — FLUTICASONE PROPIONATE 50 MCG/ACT NA SUSP
1.0000 | Freq: Every day | NASAL | Status: DC
  Administered 2023-09-22 – 2023-09-24 (×3): 1 via NASAL
  Filled 2023-09-22: qty 16

## 2023-09-22 MED ORDER — FESOTERODINE FUMARATE ER 4 MG PO TB24
4.0000 mg | ORAL_TABLET | Freq: Every day | ORAL | Status: DC
  Administered 2023-09-22 – 2023-09-23 (×2): 4 mg via ORAL
  Filled 2023-09-22 (×3): qty 1

## 2023-09-22 MED ORDER — HEPARIN SODIUM (PORCINE) 5000 UNIT/ML IJ SOLN
5000.0000 [IU] | Freq: Three times a day (TID) | INTRAMUSCULAR | Status: DC
  Filled 2023-09-22: qty 1

## 2023-09-22 MED ORDER — LORATADINE 10 MG PO TABS
10.0000 mg | ORAL_TABLET | Freq: Every day | ORAL | Status: DC
  Administered 2023-09-23 – 2023-09-24 (×2): 10 mg via ORAL
  Filled 2023-09-22 (×2): qty 1

## 2023-09-22 MED ORDER — NIFEDIPINE ER OSMOTIC RELEASE 30 MG PO TB24
90.0000 mg | ORAL_TABLET | Freq: Every day | ORAL | Status: DC
  Administered 2023-09-23 – 2023-09-24 (×2): 90 mg via ORAL
  Filled 2023-09-22 (×2): qty 1

## 2023-09-22 MED ORDER — ALBUTEROL SULFATE (2.5 MG/3ML) 0.083% IN NEBU: 2.5000 mg | INHALATION_SOLUTION | RESPIRATORY_TRACT | Status: DC | PRN

## 2023-09-22 MED ORDER — PREGABALIN 50 MG PO CAPS
50.0000 mg | ORAL_CAPSULE | Freq: Three times a day (TID) | ORAL | Status: DC
  Administered 2023-09-22 – 2023-09-24 (×5): 50 mg via ORAL
  Filled 2023-09-22 (×6): qty 1

## 2023-09-22 MED ORDER — BUDESON-GLYCOPYRROL-FORMOTEROL 160-9-4.8 MCG/ACT IN AERO
2.0000 | INHALATION_SPRAY | Freq: Two times a day (BID) | RESPIRATORY_TRACT | Status: DC
  Administered 2023-09-22 – 2023-09-24 (×4): 2 via RESPIRATORY_TRACT
  Filled 2023-09-22: qty 5.9

## 2023-09-22 MED ORDER — IOHEXOL 300 MG/ML  SOLN
80.0000 mL | Freq: Once | INTRAMUSCULAR | Status: AC | PRN
  Administered 2023-09-22: 80 mL via INTRAVENOUS

## 2023-09-22 MED ORDER — ATORVASTATIN CALCIUM 10 MG PO TABS
20.0000 mg | ORAL_TABLET | Freq: Every day | ORAL | Status: DC
  Administered 2023-09-22 – 2023-09-23 (×2): 20 mg via ORAL
  Filled 2023-09-22 (×2): qty 2

## 2023-09-22 MED ORDER — IOHEXOL 300 MG/ML  SOLN: 75.0000 mL | Freq: Once | INTRAMUSCULAR | Status: DC | PRN

## 2023-09-22 MED ORDER — HYDROCODONE-ACETAMINOPHEN 5-325 MG PO TABS
1.0000 | ORAL_TABLET | Freq: Four times a day (QID) | ORAL | Status: DC | PRN
  Administered 2023-09-22 – 2023-09-24 (×3): 1 via ORAL
  Filled 2023-09-22 (×3): qty 1

## 2023-09-22 MED ORDER — ONDANSETRON HCL 4 MG/2ML IJ SOLN: 4.0000 mg | Freq: Four times a day (QID) | INTRAMUSCULAR | Status: DC | PRN

## 2023-09-22 MED ORDER — ESTRADIOL 0.1 MG/GM VA CREA
1.0000 | TOPICAL_CREAM | Freq: Every day | VAGINAL | Status: DC
  Filled 2023-09-22: qty 42.5

## 2023-09-22 MED ORDER — BENZONATATE 100 MG PO CAPS: 200.0000 mg | ORAL_CAPSULE | Freq: Three times a day (TID) | ORAL | Status: DC | PRN

## 2023-09-22 NOTE — Plan of Care (Signed)

## 2023-09-22 NOTE — Progress Notes (Signed)
 Orthopedic Tech Progress Note Patient Details:  Heidi Lutz 1940/10/09 [id number] Short neck aspen cervical collar has been ordered from [facility].  Patient ID: [name], female   DOB: [birth year], 83 y.o.   MRN: [id number]  [name] Bar 09/22/2023, 12:54 PM

## 2023-09-22 NOTE — H&P (Signed)
 History and Physical  Heidi Lutz FMW:[id number] DOB: [birth year] DOA: 09/22/2023  PCP: Burney Darice CROME, MD   Chief Complaint: Anemia  HPI: Heidi Lutz is a 83 y.o. female with medical history significant for essential hypertension, CKD, generalized anxiety, breast cancer on tamoxifen  being admitted to the hospital for workup of sudden onset anemia.  Patient actually had a fall while visiting the beach with family about 1 week ago, states that she was feeling completely normal suddenly fell, hitting her face.  Denies any prodrome, denies feeling dizziness, palpitations, or chest pain prior.  Does not think that she passed out, denies any confusion or seizure-like activity, family including her husband and daughter who are at the bedside confirms this.  She presented for evaluation at the [facility] ER on 7/30, lab work on that day showed a hemoglobin of 7.2 reportedly according to her PCP her baseline hemoglobin last year was about 11.  She was also found on the day to have C1-C2 fracture, ER provider discussed with neurosurgery Dr. Mavis, who recommended c-collar which patient has been wearing and outpatient follow-up.  On that day, ER provider recommended observation admission but patient declined.  Patient followed up yesterday with her PCP, who insisted that she come to the ER for evaluation.  She eventually came to the ER for evaluation this morning.  Review of Systems: Please see HPI for pertinent positives and negatives. A complete 10 system review of systems is positive for reduced appetite and some weight loss in the last couple of months, is otherwise negative.  She specifically denies any dark stools, vomiting, or gross hematuria.  Past Medical History:  Diagnosis Date   Age-related osteoporosis without current pathological fracture 10/31/2019   Allergic rhinitis 07/31/2007   Overview:   S/p allergy shots, 20 years ago Dr. Mariea     10/1 IMO update    Aortic atherosclerosis (HCC) 08/16/2022   Basal cell carcinoma 12/15/2014   Overview:   Managed by Dr. Tricia, derm   Cancer [facility]) 01/2017   left breast cancer, basal cell and 1 melanoma   Cervical spondylosis 07/13/2022   Closed compression fracture of L1 lumbar vertebra, with routine healing, subsequent encounter 11/26/2013   Overview:   Normal DEXA 2015     Last Assessment & Plan:   New order for DEXA written today. Patient is getting epidural steroid injections with pain management  Overview:   Overview:   Normal DEXA 2015     Last Assessment & Plan:   continued improvement in pain, will refill tramadol  and robaxin today.  Will return to PCP for follow-up   Complication of anesthesia    heart rate spikeed in the PACU on her 2nd knee surgery   Environmental allergies    Essential hypertension 09/19/2008   Last Assessment & Plan:   Blood pressure is elevated today. I reviewed her last few pain management notes her blood pressure was normal. Patient is asymptomatic and I suspect today's blood pressure is an outlier. She will continue home monitoring at her offices and will return for further evaluation if remains elevated   GAD (generalized anxiety disorder) 04/25/2017   Last Assessment & Plan:   See a/p above   High cholesterol    History of UTI 05/25/2022   History of vertebral compression fracture 07/16/2019   Hyperlipidemia 04/22/2009   Last Assessment & Plan:   Compliant with statin, continue   IBS (irritable bowel syndrome) 08/18/2006   Overview:   with  IBS        Last Assessment & Plan:   Refill Bentyl for when necessary use. No evidence of infectious or inflammatory, or malignancy symptoms. Advised to follow-up for further evaluation if more frequent or new symptoms   Insomnia 02/02/2010   Last Assessment & Plan:   Refilled Ambien quantity #30 for the next calendar year. She denies excessive sedation and is aware to not drive when taking medication.   LBBB (left bundle branch  block) 03/22/2017   Lumbar degenerative disc disease 02/27/2015   Lumbar spinal stenosis 02/27/2015   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   Last Assessment & Plan:   Depression and anxiety triggered by recent diagnosis of breast cancer and upcoming treatments.  Will start Lexapro today.  We discussed interim use of benzodiazepines for pre-procedural or severe episodes.  She reports she has had panic attacks in the past but has not had them recently.  She never took diazepam that was previously prescribed for a procedure.  We discussed   Malignant neoplasm of central portion of left breast in female, estrogen receptor positive (HCC) 03/16/2017   Mixed dyslipidemia 03/22/2017   Mixed hyperlipidemia 03/22/2017   OAB (overactive bladder) 02/2020   Osteoarthritis    Osteoarthritis of spine with radiculopathy, lumbar region 02/27/2015   Osteopenia 03/03/2017   Overview:   DEXA 02/2017   Personal history of radiation therapy    Pleuritic chest pain 04/10/2018   PONV (postoperative nausea and vomiting)    with 1st knee surgery had n/v, none since   Pre-operative cardiovascular examination 03/22/2017   Raynaud disease    Raynauds disease-takes procardia   Raynaud's disease 08/18/2006   Rheumatoid arthritis (HCC)    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 Signa -- [place], [place]   Urge incontinence 05/25/2022   Past Surgical History:   Procedure Laterality Date   ABDOMINAL HYSTERECTOMY  1972   APPENDECTOMY  1953   BREAST BIOPSY Left 05/19/2020   benign   BREAST LUMPECTOMY Left 2019   BREAST LUMPECTOMY WITH RADIOACTIVE SEED AND SENTINEL LYMPH NODE BIOPSY Left 04/07/2017   Procedure: BREAST LUMPECTOMY WITH RADIOACTIVE SEED AND SENTINEL LYMPH NODE BIOPSY;  Surgeon: Vanderbilt Ned, MD;  Location: [facility] OR;  Service: General;  Laterality: Left;   BUNIONECTOMY  1999   COLONOSCOPY     EYE SURGERY     bil cataract   GANGLION CYST EXCISION  1968   JOINT REPLACEMENT Left 2017    reverse shoulder replacement   REPLACEMENT TOTAL KNEE  2012, 2013   Right and Left   SHOULDER ARTHROSCOPY Left    SYMPATHECTOMY  1970   TONSILLECTOMY  1949   Social History:  reports that she quit smoking about 60 years ago. Her smoking use included cigarettes. She has been exposed to tobacco smoke. She has never used smokeless tobacco. She reports current alcohol use of about 14.0 standard drinks of alcohol per week. She reports that  she does not use drugs.  Allergies  Allergen Reactions   Fluorouracil Hives   Clavulanic Acid Other (See Comments)    Clavulanic acid is a medication that can be used in conjunction with amoxicillin to manage and treat bacterial infections, specifically bacteria that are beta-lactamase producers. It is in the beta-lactamase inhibitor class of medications.   Codeine Nausea And Vomiting   Hydrocodone  Nausea And Vomiting   Penicillins Swelling, Rash and Other (See Comments)    Pt states allergic to all cillin drugs. Joint pain. Has patient had a PCN reaction causing immediate rash, facial/tongue/throat swelling, SOB or lightheadedness with hypotension: Yes Has patient had a PCN reaction causing severe rash involving mucus membranes or skin necrosis: No Has patient had a PCN reaction that required hospitalization: No Has patient had a PCN reaction occurring within the last 10 years: No If all of the above answers are  NO, then may proceed with Cephalosporin use.     Family History  Adopted: Yes  Problem Relation Age of Onset   Breast cancer Neg Hx      Prior to Admission medications   Medication Sig Start Date End Date Taking? Authorizing Provider  acetaminophen  (TYLENOL ) 500 MG tablet Take 1,000 mg by mouth every 6 (six) hours as needed for moderate pain or headache.     [provider]  atorvastatin (LIPITOR) 20 MG tablet Take 20 mg by mouth daily.    [provider]  Black Cohosh 540 MG CAPS Take 540 mg by mouth daily.     [provider]  BREZTRI AEROSPHERE 160-9-4.8 MCG/ACT AERO inhaler Inhale 2 puffs into the lungs 2 (two) times daily. 05/15/23   [provider]  calcium  gluconate 500 MG tablet Take 1 tablet (500 mg total) by mouth 2 (two) times daily. 04/19/21   Gudena, Vinay, MD  cetirizine (ZYRTEC) 10 MG tablet Take 10 mg by mouth daily.    [provider]  CRANBERRY PO Take 1 capsule by mouth daily.     [provider]  denosumab (PROLIA) 60 MG/ML SOSY injection Inject 60 mg into the skin every 6 (six) months.    [provider]  estradiol (ESTRACE) 0.1 MG/GM vaginal cream Place 1 Applicatorful vaginally at bedtime. 06/26/22   [provider]  famotidine (PEPCID) 40 MG tablet Take 40 mg by mouth daily. 04/17/23   [provider]  fosfomycin (MONUROL ) 3 g PACK Take 1 packet by mouth every 10 days 05/10/23   Stoneking, Adine PARAS., MD  Glucosamine-Chondroit-Vit C-Mn (GLUCOSAMINE-CHONDROITIN MAX ST) CAPS Take 1 capsule by mouth daily.     [provider]  lidocaine  (LIDODERM ) 5 % APPLY 2 PATCHES TO SKIN EVERY DAY, REMOVE AND REPLACE PATCH AFTER 12 HRS 08/13/20   Raulkar, Krutika P, MD  losartan (COZAAR) 25 MG tablet Take 25 mg by mouth daily. 03/29/19   [provider]  Magnesium 200 MG TABS Take 200 mg by mouth daily.     [provider]  multivitamin (RENA-VIT) TABS tablet Take 1 tablet by mouth  daily. 05/01/23   [provider]  NIFEdipine (PROCARDIA XL/ADALAT-CC) 90 MG 24 hr tablet Take 90 mg by mouth daily.     [provider]  Probiotic Product (PROBIOTIC ADVANCED PO) Take 1 capsule by mouth daily.     [provider]  solifenacin  (VESICARE ) 10 MG tablet Take 1 tablet (10 mg total) by mouth every evening. 05/10/23   Stoneking, Adine PARAS., MD  tamoxifen  (NOLVADEX ) 20 MG  tablet TAKE 1 TABLET EVERY DAY 08/18/23   Gudena, Vinay, MD  traMADol  (ULTRAM ) 50 MG tablet Take 0.5 tablets (25 mg total) by mouth daily as needed. 04/19/21   Gudena, Vinay, MD  traMADol  (ULTRAM ) 50 MG tablet Take 1 tablet (50 mg total) by mouth every 6 (six) hours as needed. 09/20/23   Francis Ileana SAILOR, PA-C    Physical Exam: BP (!) 116/56 (BP Location: Left Arm)   Pulse 90   Temp 98.1 F (36.7 C) (Oral)   Resp 16   Ht 5' 2 (1.575 m)   Wt 53.5 kg   LMP  (LMP Unknown)   SpO2 100%   BMI 21.58 kg/m  General:  Alert, oriented, calm, in no acute distress  HEENT: She has a large healing wound on the right side of her face, is wearing cervical collar Cardiovascular: RRR, no murmurs or rubs, no peripheral edema  Respiratory: clear to auscultation bilaterally, no wheezes, no crackles  Abdomen: soft, nontender, nondistended, normal bowel tones heard  Skin: dry, no rashes  Musculoskeletal: no joint effusions, normal range of motion  Psychiatric: appropriate affect, normal speech  Neurologic: extraocular muscles intact, clear speech, moving all extremities with intact sensorium         Labs on Admission:  Basic Metabolic Panel: Recent Labs  Lab 09/20/23 1201 09/22/23 1136  NA 133* 134*  K 4.6 4.2  CL 98 101  CO2 22 24  GLUCOSE 115* 145*  BUN 23 19  CREATININE 1.41* 1.21*  CALCIUM  9.0 8.8*   Liver Function Tests: Recent Labs  Lab 09/20/23 1201 09/22/23 1136  AST 50* 34  ALT 11 15  ALKPHOS 45 41  BILITOT 0.8 1.3*  PROT 6.9 7.2  ALBUMIN 3.9 3.4*   No results for input(s):  LIPASE, AMYLASE in the last 168 hours. No results for input(s): AMMONIA in the last 168 hours. CBC: Recent Labs  Lab 09/20/23 1201 09/22/23 1136  WBC 6.0 5.3  NEUTROABS 3.5 3.0  HGB 7.2* 7.6*  HCT 21.6* 24.1*  MCV 114.9* 117.6*  PLT 190 229   Cardiac Enzymes: No results for input(s): CKTOTAL, CKMB, CKMBINDEX, TROPONINI in the last 168 hours. BNP (last 3 results) No results for input(s): BNP in the last 8760 hours.  ProBNP (last 3 results) No results for input(s): PROBNP in the last 8760 hours.  CBG: Recent Labs  Lab 09/20/23 1153  GLUCAP 121*    Radiological Exams on Admission: No results found.  Assessment/Plan Heidi Lutz is a 83 y.o. female with medical history significant for essential hypertension, CKD, generalized anxiety, breast cancer on tamoxifen  being admitted to the hospital for workup of sudden onset anemia.   Symptomatic anemia-hemoglobin appears to be relatively stable, iron studies indicate possible anemia of chronic disease.  Given her history of breast cancer, this is concerning for possible metastatic disease, or perhaps MDS. -Observation admission -Monitor hemoglobin with daily labs, currently there is no indication for blood transfusion -Discussed with Dr. Odean, recommends CT chest abdomen pelvis, if normal patient may require bone marrow biopsy  Fall with injury-etiology of this is unclear, she denies significant weakness, orthopnea or dizziness prior to her fall.  May be related to her anemia. -PT evaluation  History of breast cancer-no obvious evidence of recurrence, tolerating tamoxifen  well for the last 6 years.  She is followed by Dr. Gudena.  CKD stage III-renal function appears to be at baseline  DVT prophylaxis: Subcutaneous heparin    Code Status: Full Code  Consults  called: Discussed with Dr. Odean  Admission status: Observation  Time spent: 52 minutes  Heidi Mclaurin CHRISTELLA Gail MD [facility] Pager  [phone]  If 7PM-7AM, please contact night-coverage [email or web address] Password [email or web address]  09/22/2023, 2:19 PM

## 2023-09-22 NOTE — ED Triage Notes (Signed)
 Pt reports having a hgb of 7.2 per her provider. Pt had a fall a week ago and has injuries to her face and neck.

## 2023-09-22 NOTE — ED Provider Notes (Signed)
 [place] EMERGENCY DEPARTMENT AT [facility] Provider Note   CSN: [id number] Arrival date & time: 09/22/23  1130     Patient presents with: abnormal labs   Heidi Lutz is a 83 y.o. female.   Patient is a 83 year old female with a history of rheumatoid arthritis, hyperlipidemia, hypertension, IBS, prior breast cancer who presents with anemia.  She had a fall about a week ago.  She is not sure what led to the fall.  She was seen in the ED on July 30.  She was noted to have a large hematoma to the right side of her face.  Imaging showed no evidence of intracranial hemorrhage but there was a C1/C2 fracture.  This was discussed with Dr. Mavis with neurosurgery and patient was placed in an Aspen collar.  Also during the ED visit, her hemoglobin was noted to be 7.2.  She was advised to be admitted for observation but declined.  She went to her PCP yesterday who advised her to go back to the ED for admission.  Reportedly her hemoglobin was around 11 in September 2024 per her family at bedside.  She does not report any melena or hematochezia.  She does report some fatigue.  No shortness of breath or chest pain.       Prior to Admission medications   Medication Sig Start Date End Date Taking? Authorizing Provider  acetaminophen  (TYLENOL ) 500 MG tablet Take 1,000 mg by mouth every 6 (six) hours as needed for moderate pain or headache.     [provider]  atorvastatin (LIPITOR) 20 MG tablet Take 20 mg by mouth daily.    [provider]  Black Cohosh 540 MG CAPS Take 540 mg by mouth daily.     [provider]  BREZTRI AEROSPHERE 160-9-4.8 MCG/ACT AERO inhaler Inhale 2 puffs into the lungs 2 (two) times daily. 05/15/23   [provider]  calcium  gluconate 500 MG tablet Take 1 tablet (500 mg total) by mouth 2 (two) times daily. 04/19/21   Gudena, Vinay, MD  cetirizine (ZYRTEC) 10 MG tablet Take 10 mg by mouth daily.    [provider]   CRANBERRY PO Take 1 capsule by mouth daily.     [provider]  denosumab (PROLIA) 60 MG/ML SOSY injection Inject 60 mg into the skin every 6 (six) months.    [provider]  estradiol (ESTRACE) 0.1 MG/GM vaginal cream Place 1 Applicatorful vaginally at bedtime. 06/26/22   [provider]  famotidine (PEPCID) 40 MG tablet Take 40 mg by mouth daily. 04/17/23   [provider]  fosfomycin (MONUROL ) 3 g PACK Take 1 packet by mouth every 10 days 05/10/23   Stoneking, Adine PARAS., MD  Glucosamine-Chondroit-Vit C-Mn (GLUCOSAMINE-CHONDROITIN MAX ST) CAPS Take 1 capsule by mouth daily.     [provider]  lidocaine  (LIDODERM ) 5 % APPLY 2 PATCHES TO SKIN EVERY DAY, REMOVE AND REPLACE PATCH AFTER 12 HRS 08/13/20   Raulkar, Krutika P, MD  losartan (COZAAR) 25 MG tablet Take 25 mg by mouth daily. 03/29/19   [provider]  Magnesium 200 MG TABS Take 200 mg by mouth daily.     [provider]  multivitamin (RENA-VIT) TABS tablet Take 1 tablet by mouth daily. 05/01/23   [provider]  NIFEdipine (PROCARDIA XL/ADALAT-CC) 90 MG 24 hr tablet Take 90 mg by mouth daily.     [provider]  Probiotic Product (PROBIOTIC ADVANCED PO) Take 1 capsule by  mouth daily.     [provider]  solifenacin  (VESICARE ) 10 MG tablet Take 1 tablet (10 mg total) by mouth every evening. 05/10/23   Stoneking, Adine PARAS., MD  tamoxifen  (NOLVADEX ) 20 MG tablet TAKE 1 TABLET EVERY DAY 08/18/23   Odean Potts, MD  traMADol  (ULTRAM ) 50 MG tablet Take 0.5 tablets (25 mg total) by mouth daily as needed. 04/19/21   Gudena, Vinay, MD  traMADol  (ULTRAM ) 50 MG tablet Take 1 tablet (50 mg total) by mouth every 6 (six) hours as needed. 09/20/23   Keith, Kayla N, PA-C    Allergies: Fluorouracil, Clavulanic acid, Codeine, Hydrocodone , and Penicillins    Review of Systems  Constitutional:  Positive for fatigue. Negative for chills and diaphoresis.  HENT:   Negative for congestion, rhinorrhea and sneezing.   Eyes: Negative.   Respiratory:  Negative for cough, chest tightness and shortness of breath.   Cardiovascular:  Negative for chest pain and leg swelling.  Gastrointestinal:  Negative for abdominal pain, anal bleeding, blood in stool, diarrhea, nausea and vomiting.  Genitourinary:  Negative for difficulty urinating, flank pain, frequency and hematuria.  Musculoskeletal:  Positive for neck pain. Negative for arthralgias and back pain.  Skin:  Negative for rash.  Neurological:  Negative for dizziness, speech difficulty, weakness, numbness and headaches.    Updated Vital Signs BP (!) 116/56 (BP Location: Left Arm)   Pulse 90   Temp 98.1 F (36.7 C) (Oral)   Resp 16   Ht 5' 2 (1.575 m)   Wt 53.5 kg   LMP  (LMP Unknown)   SpO2 100%   BMI 21.58 kg/m   Physical Exam Constitutional:      Appearance: She is well-developed.  HENT:     Head: Normocephalic.     Mouth/Throat:     Comments: Large area of ecchymosis to the right face and right neck Eyes:     Pupils: Pupils are equal, round, and reactive to light.  Cardiovascular:     Rate and Rhythm: Normal rate and regular rhythm.     Heart sounds: Normal heart sounds.  Pulmonary:     Effort: Pulmonary effort is normal. No respiratory distress.     Breath sounds: Normal breath sounds. No wheezing or rales.  Chest:     Chest wall: No tenderness.  Abdominal:     General: Bowel sounds are normal.     Palpations: Abdomen is soft.     Tenderness: There is no abdominal tenderness. There is no guarding or rebound.  Genitourinary:    Comments: No significant stool was obtained on rectal exam.  No gross blood Musculoskeletal:        General: Normal range of motion.     Cervical back: Normal range of motion and neck supple.  Lymphadenopathy:     Cervical: No cervical adenopathy.  Skin:    General: Skin is warm and dry.     Findings: No rash.  Neurological:     General: No focal  deficit present.     Mental Status: She is alert and oriented to person, place, and time.     (all labs ordered are listed, but only abnormal results are displayed) Labs Reviewed  CBC WITH DIFFERENTIAL/PLATELET - Abnormal; Notable for the following components:      Result Value   RBC 2.05 (*)    Hemoglobin 7.6 (*)    HCT 24.1 (*)    MCV 117.6 (*)    MCH 37.1 (*)    RDW  15.9 (*)    Monocytes Absolute 1.1 (*)    All other components within normal limits  COMPREHENSIVE METABOLIC PANEL WITH GFR - Abnormal; Notable for the following components:   Sodium 134 (*)    Glucose, Bld 145 (*)    Creatinine, Ser 1.21 (*)    Calcium  8.8 (*)    Albumin 3.4 (*)    Total Bilirubin 1.3 (*)    GFR, Estimated 44 (*)    All other components within normal limits  RESP PANEL BY RT-PCR (RSV, FLU A&B, COVID)  RVPGX2  POC OCCULT BLOOD, ED  TYPE AND SCREEN  ABO/RH    EKG: None  Radiology: No results found.   Procedures   Medications Ordered in the ED - No data to display                                  Medical Decision Making Amount and/or Complexity of Data Reviewed Labs: ordered.   This patient presents to the ED for concern of anemia, this involves an extensive number of treatment options, and is a complaint that carries with it a high risk of complications and morbidity.  I considered the following differential and admission for this acute, potentially life threatening condition.  The differential diagnosis includes vitamin deficiency, kidney disease, blood loss, chronic anemia, myelodysplastic disorder  MDM:    Patient is a 83 year old who presents after recent fall.  She was not sure what made her fall.  She does not know if she passed out.  She was seen for the fall 2 days ago and had imaging of her head, maxillofacial bones and cervical spine.  She was found to have a C1 ring fracture as well as a odontoid fracture.  Dr. Mavis was consulted and was advised to have patient placed  in the Aspen collar.  On arrival here, the Aspen collar is not fitting well and is up over her chin, covering her mouth.  We attempted to refit it but it was not fitting well.  Have consulted the Ortho tech to get a better fitting collar.  She does not have any neurologic deficits.  Rectal exam shows no gross blood.  Her hemoglobin is 7.6.  It was 7.22 days ago.  She was told by her PCP that it was 11 in September and that she should come back to be admitted.  She is amenable to admission currently.  She overall feels fatigued.  Will discuss findings with the hospitalist.  Dr. Roxane to admit the patient for further treatment.  (Labs, imaging, consults)  Labs: I Ordered, and personally interpreted labs.  The pertinent results include: Anemia  Imaging Studies ordered: I ordered imaging studies including reviewed CT head, cervical spine, maxillofacial bones from 2 days ago I independently visualized and interpreted imaging. I agree with the radiologist interpretation  Additional history obtained from family.  External records from outside source obtained and reviewed including history  Cardiac Monitoring: The patient was maintained on a cardiac monitor.  If on the cardiac monitor, I personally viewed and interpreted the cardiac monitored which showed an underlying rhythm of: Sinus rhythm  Reevaluation: After the interventions noted above, I reevaluated the patient and found that they have :improved  Social Determinants of Health:    Disposition: Admit to hospital  Co morbidities that complicate the patient evaluation  Past Medical History:  Diagnosis Date   Age-related osteoporosis without current pathological fracture  10/31/2019   Allergic rhinitis 07/31/2007   Overview:   S/p allergy shots, 20 years ago Dr. Mariea     10/1 IMO update   Aortic atherosclerosis (HCC) 08/16/2022   Basal cell carcinoma 12/15/2014   Overview:   Managed by Dr. Tricia, derm   Cancer [facility]) 01/2017    left breast cancer, basal cell and 1 melanoma   Cervical spondylosis 07/13/2022   Closed compression fracture of L1 lumbar vertebra, with routine healing, subsequent encounter 11/26/2013   Overview:   Normal DEXA 2015     Last Assessment & Plan:   New order for DEXA written today. Patient is getting epidural steroid injections with pain management  Overview:   Overview:   Normal DEXA 2015     Last Assessment & Plan:   continued improvement in pain, will refill tramadol  and robaxin today.  Will return to PCP for follow-up   Complication of anesthesia    heart rate spikeed in the PACU on her 2nd knee surgery   Environmental allergies    Essential hypertension 09/19/2008   Last Assessment & Plan:   Blood pressure is elevated today. I reviewed her last few pain management notes her blood pressure was normal. Patient is asymptomatic and I suspect today's blood pressure is an outlier. She will continue home monitoring at her offices and will return for further evaluation if remains elevated   GAD (generalized anxiety disorder) 04/25/2017   Last Assessment & Plan:   See a/p above   High cholesterol    History of UTI 05/25/2022   History of vertebral compression fracture 07/16/2019   Hyperlipidemia 04/22/2009   Last Assessment & Plan:   Compliant with statin, continue   IBS (irritable bowel syndrome) 08/18/2006   Overview:   with IBS        Last Assessment & Plan:   Refill Bentyl for when necessary use. No evidence of infectious or inflammatory, or malignancy symptoms. Advised to follow-up for further evaluation if more frequent or new symptoms   Insomnia 02/02/2010   Last Assessment & Plan:   Refilled Ambien quantity #30 for the next calendar year. She denies excessive sedation and is aware to not drive when taking medication.   LBBB (left bundle branch block) 03/22/2017   Lumbar degenerative disc disease 02/27/2015   Lumbar spinal stenosis 02/27/2015   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   Last Assessment  & Plan:   Depression and anxiety triggered by recent diagnosis of breast cancer and upcoming treatments.  Will start Lexapro today.  We discussed interim use of benzodiazepines for pre-procedural or severe episodes.  She reports she has had panic attacks in the past but has not had them recently.  She never took diazepam that was previously prescribed for a procedure.  We discussed   Malignant neoplasm of central portion of left breast in female, estrogen receptor positive (HCC) 03/16/2017   Mixed dyslipidemia 03/22/2017   Mixed hyperlipidemia 03/22/2017   OAB (overactive bladder) 02/2020   Osteoarthritis    Osteoarthritis of spine with radiculopathy, lumbar region 02/27/2015   Osteopenia 03/03/2017   Overview:   DEXA 02/2017   Personal history of radiation therapy    Pleuritic chest pain 04/10/2018   PONV (postoperative nausea and vomiting)    with 1st knee surgery had n/v, none since   Pre-operative cardiovascular examination 03/22/2017   Raynaud disease    Raynauds disease-takes procardia   Raynaud's disease 08/18/2006   Rheumatoid arthritis (HCC)    Right rotator cuff tear arthropathy  01/04/2019    LEFT shoulder glenohumeral injection-06/23/2014     Last Assessment & Plan:      INFORMED CONSENT:  Surgical Precedure:  LEFT reverse total shoulder arthroplasty  The indications, risks, alternatives, and expectations of the planned surgical procedure were discussed in detail. 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 Signa -- [place], [place]   Urge incontinence 05/25/2022     Medicines No orders of the defined types were placed in this encounter.   I have reviewed the patients home medicines and have made adjustments as needed  Problem List  / ED Course: Problem List Items Addressed This Visit   None Visit Diagnoses       Fall, initial encounter    -  Primary     Closed nondisplaced fracture of first cervical vertebra, unspecified fracture morphology, initial encounter (HCC)         Closed displaced fracture of second cervical vertebra, unspecified fracture morphology, initial encounter (HCC)         Anemia, unspecified type                    Final diagnoses:  Fall, initial encounter  Closed nondisplaced fracture of first cervical vertebra, unspecified fracture morphology, initial encounter (HCC)  Closed displaced fracture of second cervical vertebra, unspecified fracture morphology, initial encounter (HCC)  Anemia, unspecified type    ED Discharge Orders     None          Lenor Hollering, MD 09/22/23 1343

## 2023-09-23 DIAGNOSIS — W19XXXA Unspecified fall, initial encounter: Secondary | ICD-10-CM

## 2023-09-23 DIAGNOSIS — D649 Anemia, unspecified: Secondary | ICD-10-CM | POA: Diagnosis not present

## 2023-09-23 DIAGNOSIS — S12100A Unspecified displaced fracture of second cervical vertebra, initial encounter for closed fracture: Secondary | ICD-10-CM

## 2023-09-23 LAB — BASIC METABOLIC PANEL WITH GFR
Anion gap: 9 (ref 5–15)
BUN: 18 mg/dL (ref 8–23)
CO2: 23 mmol/L (ref 22–32)
Calcium: 8.4 mg/dL — ABNORMAL LOW (ref 8.9–10.3)
Chloride: 104 mmol/L (ref 98–111)
Creatinine, Ser: 1.36 mg/dL — ABNORMAL HIGH (ref 0.44–1.00)
GFR, Estimated: 38 mL/min — ABNORMAL LOW (ref >60)
Glucose, Bld: 99 mg/dL (ref 70–99)
Potassium: 4.3 mmol/L (ref 3.5–5.1)
Sodium: 136 mmol/L (ref 135–145)

## 2023-09-23 LAB — CBC
HCT: 20.8 % — ABNORMAL LOW (ref 36.0–46.0)
Hemoglobin: 6.5 g/dL — CL (ref 12.0–15.0)
MCH: 37.4 pg — ABNORMAL HIGH (ref 26.0–34.0)
MCHC: 31.3 g/dL (ref 30.0–36.0)
MCV: 119.5 fL — ABNORMAL HIGH (ref 80.0–100.0)
Platelets: 197 K/uL (ref 150–400)
RBC: 1.74 MIL/uL — ABNORMAL LOW (ref 3.87–5.11)
RDW: 16 % — ABNORMAL HIGH (ref 11.5–15.5)
WBC: 4.5 K/uL (ref 4.0–10.5)
nRBC: 0 % (ref 0.0–0.2)

## 2023-09-23 LAB — PREPARE RBC (CROSSMATCH)

## 2023-09-23 LAB — HEMOGLOBIN AND HEMATOCRIT, BLOOD
HCT: 26.1 % — ABNORMAL LOW (ref 36.0–46.0)
Hemoglobin: 8.1 g/dL — ABNORMAL LOW (ref 12.0–15.0)

## 2023-09-23 MED ORDER — SODIUM CHLORIDE 0.9% IV SOLUTION: Freq: Once | INTRAVENOUS | Status: AC

## 2023-09-23 MED ORDER — FAMOTIDINE 20 MG PO TABS
20.0000 mg | ORAL_TABLET | Freq: Every day | ORAL | Status: DC
  Administered 2023-09-23: 20 mg via ORAL
  Filled 2023-09-23: qty 1

## 2023-09-23 NOTE — TOC Initial Note (Signed)
 Transition of Care [facility]) - Initial/Assessment Note    Patient Details  Name: Heidi Lutz MRN: [id number] Date of Birth: 03-29-1940  [facility] of [facility]) CM/SW Contact:    Jon ONEIDA Anon, RN Phone Number: 09/23/2023, 1:14 PM  Clinical Narrative:                 Pt is from independent living with spouse at [facility]. Pt is very sleepy but awakes to voice while NCM at bedside. Pt spouse and daughter offer pt information. Spouse states daughter helps with all healthcare matters and comfortable with NCM to speak with  her. States pt has rollator that she uses for ambulation at baseline. Denies any HH or DME or home O2 needs at this time. PT consulted, awaiting any new recommendations. MOON complete. Care Management following.     Expected Discharge Plan: Home/Self Care Barriers to Discharge: Continued Medical Work up   Patient Goals and CMS Choice Patient states their goals for this hospitalization and ongoing recovery are:: To return to [facility] in Independent living per spouse and daughter at bedside CMS Medicare.gov Compare Post Acute Care list provided to:: Patient Represenative (must comment) (Spouse) Choice offered to / list presented to : Spouse [place] ownership interest in [facility].provided to:: Spouse    Expected Discharge Plan and Services In-house Referral: NA Discharge Planning Services: CM Consult Post Acute Care Choice: Durable Medical Equipment (Rollator) Living arrangements for the past 2 months: Independent Living Facility                 DME Arranged: N/A DME Agency: NA       HH Arranged: NA HH Agency: NA        Prior Living Arrangements/Services Living arrangements for the past 2 months: Independent Living Facility Lives with:: Spouse Patient language and need for interpreter reviewed:: Yes Do you feel safe going back to the place where you live?: Yes      Need for Family Participation in Patient Care: Yes (Comment) Care  giver support system in place?: Yes (comment) Current home services: DME (Rollator) Criminal Activity/Legal Involvement Pertinent to Current Situation/Hospitalization: No - Comment as needed  Activities of Daily Living   ADL Screening (condition at time of admission) Independently performs ADLs?: Yes (appropriate for developmental age) Is the patient deaf or have difficulty hearing?: Yes Does the patient have difficulty seeing, even when wearing glasses/contacts?: No Does the patient have difficulty concentrating, remembering, or making decisions?: No  Permission Sought/Granted Permission sought to share information with : Family Supports, Oceanographer granted to share information with : Yes, Verbal Permission Granted  Share Information with NAME: Ronald, Vinsant (Spouse)  [phone]  Permission granted to share info w AGENCY: [facility]        Emotional Assessment Appearance:: Appears stated age Attitude/Demeanor/Rapport: Lethargic Affect (typically observed): Unable to Assess Orientation: :  (Unable to assess) Alcohol / Substance Use: Not Applicable Psych Involvement: No (comment)  Admission diagnosis:  Fall, initial encounter [W19.XXXA] Symptomatic anemia [D64.9] Anemia, unspecified type [D64.9] Closed displaced fracture of second cervical vertebra, unspecified fracture morphology, initial encounter (HCC) [S12.100A] Closed nondisplaced fracture of first cervical vertebra, unspecified fracture morphology, initial encounter [facility]) [S12.001A] Patient Active Problem List   Diagnosis Date Noted   Symptomatic anemia 09/22/2023   Aortic atherosclerosis (HCC) 08/16/2022   Cervical spondylosis 07/13/2022   History of UTI 05/25/2022   Urge incontinence 05/25/2022   Rheumatoid arthritis (HCC)    Raynaud disease    PONV (postoperative  nausea and vomiting)    Personal history of radiation therapy    Osteoarthritis    High cholesterol    Environmental  allergies    Complication of anesthesia    OAB (overactive bladder) 02/2020   Age-related osteoporosis without current pathological fracture 10/31/2019   History of vertebral compression fracture 07/16/2019   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   Pleuritic chest pain 04/10/2018   GAD (generalized anxiety disorder) 04/25/2017   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   LBBB (left bundle branch block) 03/22/2017   Pre-operative cardiovascular examination 03/22/2017   Mixed dyslipidemia 03/22/2017   Mixed hyperlipidemia 03/22/2017   Malignant neoplasm of central portion of left breast in female, estrogen receptor positive (HCC) 03/16/2017   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/2017   Osteopenia 03/03/2017   Cancer (HCC) 01/2017   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   Lumbar degenerative disc disease 02/27/2015   Lumbar spinal stenosis 02/27/2015   Osteoarthritis of spine with radiculopathy, lumbar region 02/27/2015   Basal cell carcinoma 12/15/2014   Closed compression fracture of L1 lumbar vertebra, with routine healing, subsequent encounter 11/26/2013   Insomnia 02/02/2010   Hyperlipidemia 04/22/2009   Essential hypertension 09/19/2008   Allergic rhinitis 07/31/2007   IBS (irritable bowel syndrome) 08/18/2006   Raynaud's disease 08/18/2006   PCP:  Burney Darice CROME, MD Pharmacy:   [id number] - [place], [place] - [address] [place] [address] Phone: [phone] Fax: [phone]     Social Drivers of Health (SDOH) Social History: SDOH Screenings   Food Insecurity: No Food Insecurity (09/22/2023)  Housing: Low Risk  (09/22/2023)  Transportation Needs: No Transportation Needs (09/22/2023)  Utilities: Not At Risk (09/22/2023)  Depression (PHQ2-9): Low Risk  (03/20/2020)  Financial Resource Strain: Low Risk  (05/03/2021)   Received from [facility]   Physical Activity: Insufficiently Active (05/03/2021)   Received from [facility]  Social Connections: Unknown (09/22/2023)  Stress: No Stress Concern Present (05/03/2021)   Received from [facility]  Tobacco Use: Medium Risk (09/22/2023)   SDOH Interventions:     Readmission Risk Interventions     No data to display

## 2023-09-23 NOTE — Progress Notes (Signed)
 [facility]  PROGRESS NOTE  Heidi Lutz FMW:[id number] DOB: [birth year] DOA: 09/22/2023 PCP: Burney Darice CROME, MD   Brief HPI:     83 y.o. female with medical history significant for essential hypertension, CKD, generalized anxiety, breast cancer on tamoxifen  being admitted to the hospital for workup of sudden onset anemia.  Patient actually had a fall while [place] the beach with [place] about 1 week ago, states that she was feeling completely normal suddenly fell, hitting her face.  Denies any prodrome, denies feeling dizziness, palpitations, or chest pain prior.  Does not think that she passed out, denies any confusion or seizure-like activity, family including her husband and daughter who are at the bedside confirms this.  She presented for evaluation at the [facility] ER on 7/30, lab work on that day showed a hemoglobin of 7.2 reportedly according to her PCP her baseline hemoglobin last year was about 11.  She was also found on the day to have C1-C2 fracture, ER provider discussed with neurosurgery Dr. Mavis, who recommended c-collar which patient has been wearing and outpatient follow-up.  On that day, ER provider recommended observation admission but patient declined.  Patient followed up yesterday with her PCP, who insisted that she come to the ER for evaluation.  She eventually came to the ER for evaluation this morning.     Assessment/Plan:    Heidi Lutz is a 83 y.o. female with medical history significant for essential hypertension, CKD, generalized anxiety, breast cancer on tamoxifen  being admitted to the hospital for workup of sudden onset anemia.    Symptomatic anemia-hemoglobin appears to be relatively stable, iron studies indicate possible anemia of chronic disease.  Given her history of breast cancer, this is concerning for possible metastatic disease, or perhaps MDS. -Hemoglobin dropped to 6.5 this morning.  S/p 1 unit PRBC -FOBT was positive,  [facility] consulted -Plan for further evaluation and workup as outpatient -Admitting provider discussed with Dr. Odean, recommends CT chest abdomen pelvis, if normal patient may require bone marrow biopsy -CT abdomen/pelvis/chest is unremarkable; showed nonspecific several subcentimeter hepatic hypodensities. -Also patient has large subcutaneous hematoma over the right temporalis; which could be the cause of patient's anemia -Follow CBC in a.m.   Fall with injury-etiology of this is unclear, she denies significant weakness, orthopnea or dizziness prior to her fall.  May be related to her anemia. -PT evaluation   History of breast cancer-no obvious evidence of recurrence, tolerating tamoxifen  well for the last 6 years.  She is followed by Dr. Gudena.   CKD stage III-renal function appears to be at baseline   Medications     sodium chloride    Intravenous Once   atorvastatin   20 mg Oral QHS   budesonide -glycopyrrolate -formoterol   2 puff Inhalation BID   estradiol   1 Applicatorful Vaginal QHS   famotidine   40 mg Oral QHS   fesoterodine   4 mg Oral q1800   fluticasone   1 spray Each Nare Daily   loratadine   10 mg Oral Daily   NIFEdipine   90 mg Oral Daily   pregabalin   50 mg Oral TID     Data Reviewed:   CBG:  Recent Labs  Lab 09/20/23 1153  GLUCAP 121*    SpO2: 96 %    Vitals:   09/22/23 1514 09/22/23 1938 09/22/23 2033 09/23/23 0637  BP: 99/64  (!) 100/45 (!) 121/53  Pulse: 85  92 86  Resp: 16     Temp: 98.7 F (37.1 C)  99.5 F (37.5 C) 98.5 F (36.9 C)  TempSrc: Oral     SpO2: 95% 92% 96% 96%  Weight:      Height:          Data Reviewed:  Basic Metabolic Panel: Recent Labs  Lab 09/20/23 1201 09/22/23 1136 09/23/23 0522  NA 133* 134* 136  K 4.6 4.2 4.3  CL 98 101 104  CO2 22 24 23   GLUCOSE 115* 145* 99  BUN 23 19 18   CREATININE 1.41* 1.21* 1.36*  CALCIUM  9.0 8.8* 8.4*    CBC: Recent Labs  Lab 09/20/23 1201 09/22/23 1136  09/23/23 0522  WBC 6.0 5.3 4.5  NEUTROABS 3.5 3.0  --   HGB 7.2* 7.6* 6.5*  HCT 21.6* 24.1* 20.8*  MCV 114.9* 117.6* 119.5*  PLT 190 229 197    LFT Recent Labs  Lab 09/20/23 1201 09/22/23 1136  AST 50* 34  ALT 11 15  ALKPHOS 45 41  BILITOT 0.8 1.3*  PROT 6.9 7.2  ALBUMIN 3.9 3.4*     Antibiotics: Anti-infectives (From admission, onward)    None        DVT prophylaxis: SCDs  Code Status: Full code  Family Communication: Discussed with family members at bedside   CONSULTS gastroenterology   Subjective   No new complaints   Objective    Physical Examination:   General-appears in no acute distress Heart-S1-S2, regular, no murmur auscultated Lungs-clear to auscultation bilaterally, no wheezing or crackles auscultated Abdomen-soft, nontender, no organomegaly Extremities-no edema in the lower extremities Neuro-alert, oriented x3, no focal deficit noted  Status is: Inpatient:             Sabas GORMAN Brod   [facility] If 7PM-7AM, please contact night-coverage at [email or web address], Office  [phone]   09/23/2023, 8:40 AM  LOS: 0 days

## 2023-09-23 NOTE — Consult Note (Signed)
 Referring Provider: [facility] Primary Care Physician:  Burney Darice CROME, MD Primary Gastroenterologist: Sampson  Reason for Consultation: Anemia, FOBT positive  HPI: Heidi Lutz is a 83 y.o. female with past medical history of breast cancer currently on tamoxifen , history of chronic kidney disease, anxiety and hypertension presented to the hospital for workup for anemia.  Patient with recent fall.  Upon arriving to ED she was found to have anemia with hemoglobin of 7.2.  Hemoglobin normal in the past.  Hemoglobin dropped to 6.5 today.  FOBT positive.  Patient has elevated MCV of 119.  Normal platelet count.  Normal WBCs.  Iron studies showed elevated ferritin, normal iron saturation, normal iron binding capacity but mild decrease in TIBC.  Normal vitamin B12 and folate level.  CT chest abdomen pelvis with IV contrast yesterday showed no acute changes.  No evidence of malignancy or metastasis.  GI is consulted for further evaluation.  Patient seen and examined at bedside.  Patient denies seeing any blood in the stool or black stool.  Denies any abdominal pain, nausea or vomiting.  Having intermittent trouble swallowing mostly with solid foods for many years.  Denies any diarrhea or constipation.  Last colonoscopy in 2014 was normal and was told not to have another colonoscopy done.  Also had negative Cologuard in the past.  Past Medical History:  Diagnosis Date   Age-related osteoporosis without current pathological fracture 10/31/2019   Allergic rhinitis 07/31/2007   Overview:   S/p allergy shots, 20 years ago Dr. Mariea     10/1 IMO update   Aortic atherosclerosis (HCC) 08/16/2022   Basal cell carcinoma 12/15/2014   Overview:   Managed by Dr. Tricia, derm   Cancer [facility]) 01/2017   left breast cancer, basal cell and 1 melanoma   Cervical spondylosis 07/13/2022   Closed compression fracture of L1 lumbar vertebra, with routine healing, subsequent encounter 11/26/2013   Overview:    Normal DEXA 2015     Last Assessment & Plan:   New order for DEXA written today. Patient is getting epidural steroid injections with pain management  Overview:   Overview:   Normal DEXA 2015     Last Assessment & Plan:   continued improvement in pain, will refill tramadol  and robaxin today.  Will return to PCP for follow-up   Complication of anesthesia    heart rate spikeed in the PACU on her 2nd knee surgery   Environmental allergies    Essential hypertension 09/19/2008   Last Assessment & Plan:   Blood pressure is elevated today. I reviewed her last few pain management notes her blood pressure was normal. Patient is asymptomatic and I suspect today's blood pressure is an outlier. She will continue home monitoring at her offices and will return for further evaluation if remains elevated   GAD (generalized anxiety disorder) 04/25/2017   Last Assessment & Plan:   See a/p above   High cholesterol    History of UTI 05/25/2022   History of vertebral compression fracture 07/16/2019   Hyperlipidemia 04/22/2009   Last Assessment & Plan:   Compliant with statin, continue   IBS (irritable bowel syndrome) 08/18/2006   Overview:   with IBS        Last Assessment & Plan:   Refill Bentyl for when necessary use. No evidence of infectious or inflammatory, or malignancy symptoms. Advised to follow-up for further evaluation if more frequent or new symptoms   Insomnia 02/02/2010   Last Assessment & Plan:   Refilled  Ambien quantity #30 for the next calendar year. She denies excessive sedation and is aware to not drive when taking medication.   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  Will start Lexapro today.  We discussed interim use of benzodiazepines for pre-procedural or  severe episodes.  She reports she has had panic attacks in the past but has not had them recently.  She never took diazepam that was previously prescribed for a procedure.  We discussed   Malignant neoplasm of central portion of left breast in female, estrogen receptor positive (HCC) 03/16/2017   Mixed dyslipidemia 03/22/2017   Mixed hyperlipidemia 03/22/2017   OAB (overactive bladder) 02/2020   Osteoarthritis    Osteoarthritis of spine with radiculopathy, lumbar region 02/27/2015   Osteopenia 03/03/2017   Overview:   DEXA 02/2017   Personal history of radiation therapy    Pleuritic chest pain 04/10/2018   PONV (postoperative nausea and vomiting)    with 1st knee surgery had n/v, none since   Pre-operative cardiovascular examination 03/22/2017   Raynaud disease    Raynauds disease-takes procardia    Raynaud's disease 08/18/2006   Rheumatoid arthritis (HCC)    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 Signa -- [employer], [place]   Urge incontinence 05/25/2022    Past Surgical History:  Procedure Laterality Date   ABDOMINAL HYSTERECTOMY  1972   APPENDECTOMY  1953   BREAST BIOPSY Left 05/19/2020   benign   BREAST LUMPECTOMY Left 2019   BREAST LUMPECTOMY WITH RADIOACTIVE SEED AND SENTINEL LYMPH NODE BIOPSY Left 04/07/2017   Procedure: BREAST LUMPECTOMY WITH RADIOACTIVE SEED AND SENTINEL LYMPH NODE BIOPSY;  Surgeon: Vanderbilt Ned, MD;  Location: [facility] OR;  Service: General;  Laterality: Left;   BUNIONECTOMY  1999   COLONOSCOPY     EYE SURGERY     bil cataract   GANGLION CYST EXCISION  1968   JOINT REPLACEMENT Left 2017    reverse shoulder replacement   REPLACEMENT TOTAL KNEE  2012, 2013   Right and Left   SHOULDER ARTHROSCOPY Left    SYMPATHECTOMY  1970   TONSILLECTOMY  1949    Prior to Admission medications   Medication Sig Start Date End Date Taking? Authorizing Provider  albuterol  (VENTOLIN  HFA) 108 (90 Base) MCG/ACT inhaler Inhale 2 puffs into the lungs every 6 (six) hours as needed for wheezing or shortness of breath.   Yes [provider]  atorvastatin  (LIPITOR) 20 MG tablet Take 20 mg by mouth at bedtime.   Yes [provider]  benzonatate  (TESSALON ) 200 MG capsule Take 200 mg by mouth 3 (three) times daily as needed for cough.   Yes [provider]  Black Cohosh 540 MG CAPS Take  540 mg by mouth daily.    Yes [provider]  BREZTRI  AEROSPHERE 160-9-4.8 MCG/ACT AERO inhaler Inhale 2 puffs into the lungs 2 (two) times daily. 05/15/23  Yes [provider]  Calcium  Carb-Cholecalciferol (CALCIUM  + D3 PO) Take 1 tablet by mouth 2 (two) times daily.   Yes [provider]  cetirizine (ZYRTEC) 10 MG tablet Take 10 mg by mouth daily.   Yes [provider]  clobetasol cream (TEMOVATE) 0.05 % Apply 1 Application topically See admin instructions. Apply to affected areas 2 times a day   Yes [provider]  CRANBERRY PO Take 1 capsule by mouth daily.    Yes [provider]  denosumab (PROLIA) 60 MG/ML SOSY injection Inject 60 mg into the skin every 6 (six) months.   Yes [provider]  Diclofenac Sodium 3 % GEL Apply 2 g topically 4 (four) times daily as needed (for pain- affected sites).   Yes [provider]  ECOTRIN LOW STRENGTH 81 MG tablet Take 81 mg by mouth every evening. Swallow whole.   Yes [provider]  estradiol  (ESTRACE ) 0.1 MG/GM vaginal cream Place 1 Applicatorful vaginally at bedtime. 06/26/22  Yes [provider]  famotidine  (PEPCID ) 40 MG tablet Take 40 mg by mouth at bedtime. 04/17/23  Yes [provider]  fosfomycin (MONUROL ) 3 g PACK Take 1 packet by mouth every 10 days Patient taking differently: Take 3 g by mouth See admin instructions. Take 3 grams (1 packet) by mouth every 10 days 05/10/23  Yes Stoneking, Adine PARAS., MD  Glucosamine-Chondroit-Vit C-Mn (GLUCOSAMINE-CHONDROITIN MAX ST) CAPS Take 1 capsule by mouth daily.    Yes [provider]  HYDROcodone -acetaminophen  (NORCO/VICODIN) 5-325 MG tablet Take 1 tablet by mouth See admin instructions. Take 1 tablet by mouth twice a day, alternating with 1,300 mg (of) Tylenol    Yes [provider]  KERENDIA 10 MG TABS Take 10 mg by mouth in the morning.   Yes [provider]  lidocaine  (LIDODERM ) 5 % APPLY 2 PATCHES TO SKIN EVERY DAY, REMOVE AND REPLACE PATCH AFTER 12 HRS 08/13/20  Yes Raulkar, Sven SQUIBB, MD  Magnesium 200 MG TABS Take 200 mg by mouth daily.    Yes [provider]  multivitamin (RENA-VIT) TABS tablet Take 1 tablet by mouth daily. 05/01/23  Yes [provider]  NIFEdipine  (PROCARDIA  XL/ADALAT -CC) 90 MG 24 hr tablet Take 90 mg by mouth daily.    Yes [provider]  pregabalin  (LYRICA ) 50 MG capsule Take 50 mg by mouth 3 (three) times daily.   Yes [provider]  Probiotic Product (PROBIOTIC ADVANCED PO) Take 1 capsule by mouth daily.    Yes [provider]  solifenacin  (VESICARE ) 10 MG tablet Take 1 tablet (10 mg total) by mouth every evening. 05/10/23  Yes Stoneking, Adine PARAS., MD  tamoxifen  (NOLVADEX ) 20 MG tablet TAKE 1 TABLET EVERY DAY Patient taking differently: Take 20 mg by mouth in the morning. 08/18/23  Yes Odean Potts, MD  traMADol  (ULTRAM ) 50 MG tablet Take 1 tablet (50 mg total) by mouth every 6 (six) hours as  needed. Patient taking differently: Take 50 mg by mouth every 6 (six) hours as needed (for pain). 09/20/23  Yes Francis Ileana SAILOR, PA-C  TYLENOL  8 HOUR ARTHRITIS PAIN 650 MG CR tablet Take 1,300 mg by mouth See admin instructions. Take 1,300 mg by mouth twice a day, alternating with Norco 5-325   Yes [provider]  calcium  gluconate  500 MG tablet Take 1 tablet (500 mg total) by mouth 2 (two) times daily. Patient not taking: Reported on 09/22/2023 04/19/21   Gudena, Vinay, MD  traMADol  (ULTRAM ) 50 MG tablet Take 0.5 tablets (25 mg total) by mouth daily as needed. Patient not taking: Reported on 09/22/2023 04/19/21   Odean Potts, MD    Scheduled Meds:  atorvastatin   20 mg Oral QHS   budesonide -glycopyrrolate -formoterol   2 puff Inhalation BID   estradiol   1 Applicatorful Vaginal QHS   famotidine   40 mg Oral QHS   fesoterodine   4 mg Oral q1800   fluticasone   1 spray Each Nare Daily   loratadine   10 mg Oral Daily   NIFEdipine   90 mg Oral Daily   pregabalin   50 mg Oral TID   Continuous Infusions: PRN Meds:.acetaminophen  **OR** acetaminophen , albuterol , benzonatate , HYDROcodone -acetaminophen , ondansetron  **OR** ondansetron  (ZOFRAN ) IV, traZODone   Allergies as of 09/22/2023 - Review Complete 09/22/2023  Allergen Reaction Noted   Fluorouracil Hives 02/03/2015   Other Hives, Itching, Swelling, Rash, and Other (See Comments) 09/22/2023   Clavulanic acid Other (See Comments) 04/25/2023   Codeine Nausea And Vomiting 12/12/2013   Hydrocodone  Nausea And Vomiting 12/12/2013   Penicillins Hives, Swelling, Rash, and Other (See Comments) 10/30/2012    Family History  Adopted: Yes  Problem Relation Age of Onset   Breast cancer Neg Hx     Social History   Socioeconomic History   Marital status: Married    Spouse name: Not on file   Number of children: Not on file   Years of education: Not on file   Highest education level: Not on file  Occupational History   Not on file  Tobacco Use    Smoking status: Former    Current packs/day: 0.00    Types: Cigarettes    Quit date: 02/22/1963    Years since quitting: 60.6    Passive exposure: Past   Smokeless tobacco: Never  Vaping Use   Vaping status: Never Used  Substance and Sexual Activity   Alcohol use: Yes    Alcohol/week: 14.0 standard drinks of alcohol    Types: 14 Glasses of wine per week    Comment: 2 glasses of wine daily.    Drug use: No   Sexual activity: Not on file  Other Topics Concern   Not on file  Social History Narrative   Not on file   Social Drivers of Health   Financial Resource Strain: Low Risk  (05/03/2021)   Received from [facility]   Overall Financial Resource Strain (CARDIA)    Difficulty of Paying Living Expenses: Not hard at all  Food Insecurity: No Food Insecurity (09/22/2023)   Hunger Vital Sign    Worried About Running Out of Food in the Last Year: Never true    Ran Out of Food in the Last Year: Never true  Transportation Needs: No Transportation Needs (09/22/2023)   PRAPARE - Administrator, Civil Service (Medical): No    Lack of Transportation (Non-Medical): No  Physical Activity: Insufficiently Active (05/03/2021)   Received from [facility]   Exercise Vital Sign    On average, how many days per week do you engage in moderate to strenuous exercise (like a brisk walk)?: 3 days    On average, how many minutes do you engage in exercise at this level?: 40 min  Stress: No Stress Concern Present (05/03/2021)   Received from [facility] - Occupational Stress  Questionnaire    Feeling of Stress : Not at all  Social Connections: Unknown (09/22/2023)   Social Connection and Isolation Panel    Frequency of Communication with Friends and Family: Patient declined    Frequency of Social Gatherings with Friends and Family: Patient declined    Attends Religious Services: Patient declined    Database administrator or Organizations: Patient  declined    Attends Banker Meetings: Patient declined    Marital Status: Married  Catering manager Violence: Unknown (09/22/2023)   Humiliation, Afraid, Rape, and Kick questionnaire    Fear of Current or Ex-Partner: No    Emotionally Abused: No    Physically Abused: Not on file    Sexually Abused: No    Review of Systems: All negative except as stated above in HPI.  Physical Exam: Vital signs: Vitals:   09/23/23 1202 09/23/23 1300  BP: 114/71 (!) 132/48  Pulse: 84 81  Resp: 16 16  Temp: 98.3 F (36.8 C) 98.3 F (36.8 C)  SpO2: 97% 100%     General:   Elderly patient currently in a cervical collar with multiple bruises over the face and upper extremity. Extraocular movement intact, no scleral icterus Lungs: No visible respiratory distress Heart:  Regular rate and rhythm; no murmurs, clicks, rubs,  or gallops. Abdomen: Soft, nontender, nondistended, bowel sound present, no peritoneal signs Mood and affect normal Alert and oriented x 3 Rectal:  Deferred  GI:  Lab Results: Recent Labs    09/22/23 1136 09/23/23 0522  WBC 5.3 4.5  HGB 7.6* 6.5*  HCT 24.1* 20.8*  PLT 229 197   BMET Recent Labs    09/22/23 1136 09/23/23 0522  NA 134* 136  K 4.2 4.3  CL 101 104  CO2 24 23  GLUCOSE 145* 99  BUN 19 18  CREATININE 1.21* 1.36*  CALCIUM  8.8* 8.4*   LFT Recent Labs    09/22/23 1136  PROT 7.2  ALBUMIN 3.4*  AST 34  ALT 15  ALKPHOS 41  BILITOT 1.3*   PT/INR No results for input(s): LABPROT, INR in the last 72 hours.   Studies/Results: CT CHEST ABDOMEN PELVIS W CONTRAST Result Date: 09/22/2023 CLINICAL DATA:  Metastatic disease evaluation Pt reports having a hgb of 7.2 per her provider. Pt had a fall a week ago and has injuries to her face and neck. EXAM: CT CHEST, ABDOMEN, AND PELVIS WITH CONTRAST TECHNIQUE: Multidetector CT imaging of the chest, abdomen and pelvis was performed following the standard protocol during bolus administration of  intravenous contrast. RADIATION DOSE REDUCTION: This exam was performed according to the departmental dose-optimization program which includes automated exposure control, adjustment of the mA and/or kV according to patient size and/or use of iterative reconstruction technique. CONTRAST:  80mL OMNIPAQUE  IOHEXOL  300 MG/ML  SOLN COMPARISON:  CT chest 05/04/2018 FINDINGS: CHEST: Cardiovascular: No aortic injury. The thoracic aorta is normal in caliber. The heart is normal in size. No significant pericardial effusion. Severe atherosclerotic plaque. Aortic valve leaflet calcification. Four-vessel coronary artery calcification. Mediastinum/Nodes: No pneumomediastinum. No mediastinal hematoma. The esophagus is unremarkable. The thyroid is unremarkable. The central airways are patent. No mediastinal, hilar, or axillary lymphadenopathy. Lungs/Pleura: No focal consolidation. No pulmonary nodule. No pulmonary mass. No pulmonary contusion or laceration. No pneumatocele formation. No pleural effusion. No pneumothorax. No hemothorax. Musculoskeletal/Chest wall: No chest wall mass. Left breast surgical changes with chronic 4 x 1.7 cm fluid density lesion (2:38). No acute rib or sternal fracture. Old nonunionized healed  left rib fracture. No spinal fracture. Total reverse left shoulder arthroplasty. Severe degenerative changes of the right shoulder. Fluid within the right shoulder bursa. ABDOMEN / PELVIS: Hepatobiliary: Not enlarged. Nonspecific several subcentimeter hypodensities. No laceration or subcapsular hematoma. The gallbladder is otherwise unremarkable with no radio-opaque gallstones. No biliary ductal dilatation. Pancreas: Normal pancreatic contour. No main pancreatic duct dilatation. Spleen: Not enlarged. No focal lesion. No laceration, subcapsular hematoma, or vascular injury. Adrenals/Urinary Tract: No nodularity bilaterally. Bilateral kidneys enhance symmetrically. Bilateral renal cortical scarring. No hydronephrosis.  No contusion, laceration, or subcapsular hematoma. No injury to the vascular structures or collecting systems. No hydroureter. The urinary bladder is unremarkable. Stomach/Bowel: No small or large bowel wall thickening or dilatation. Colonic diverticulosis. The appendix is not definitely identified with no inflammatory changes in the right lower quadrant to suggest acute appendicitis. Vasculature/Lymphatics: Severe atherosclerotic plaque. No abdominal aorta or iliac aneurysm. No active contrast extravasation or pseudoaneurysm. No abdominal, pelvic, inguinal lymphadenopathy. Reproductive: Status post hysterectomy.  No adnexal mass. Other: No simple free fluid ascites. No pneumoperitoneum. No hemoperitoneum. No mesenteric hematoma identified. No organized fluid collection. Musculoskeletal: No significant soft tissue hematoma. Seventy fat containing umbilical hernia. No acute pelvic fracture. No spinal fracture. Multilevel severe degenerative changes of the spine with intervertebral disc space vacuum phenomenon. Old superior endplate L2 compression fracture. Other ports and devices: None. IMPRESSION: 1. No acute intrathoracic, intra-abdominal, intrapelvic traumatic injury. 2. No acute fracture or traumatic malalignment of the thoracic or lumbar spine. 3. Other imaging findings of potential clinical significance: No findings of malignancy or metastases. Nonspecific several subcentimeter hepatic hypodensities. Colonic diverticulosis with no acute diverticulitis. Severe right shoulder degenerative changes with likely bursitis. Aortic Atherosclerosis (ICD10-I70.0) -severe, including coronary artery and aortic valve leaflet (correlate for aortic stenosis). Leftbreast surgical changes with chronic 4 x 1.7 cm fluid density lesion Electronically Signed   By: Morgane  Naveau M.D.   On: 09/22/2023 19:26    Impression/Plan: -Anemia with hemoglobin down to 6.5.  Elevated MCV.  FOBT positive but no overt bleeding. - Recent  fall with acute anterior cervical fracture. - Large subcutaneous hematoma over the right temporalis.  Recommendations ----------------------------- - Patient with anemia but no overt bleeding.  CT scan showed large subcutaneous hematoma over right temporalis.  Also has acute cervical spine fracture. - Patient and family would like to avoid invasive procedures if possible - Given lack of overt bleeding, I think is reasonable to do conservative management. - Recommend transfusion to keep hemoglobin more than 7 - Recommend outpatient follow-up with GI in 2 months for further evaluation of anemia depending on overall clinical situation. - GI will sign off.  Call us  back if needed.   LOS: 0 days   Layla Lah  MD, FACP 09/23/2023, 1:27 PM  Contact #  [phone]

## 2023-09-23 NOTE — Plan of Care (Signed)

## 2023-09-23 NOTE — Progress Notes (Signed)
 PT Cancellation Note  Patient Details Name: Heidi Lutz MRN: [id number] DOB: December 28, 1940   Cancelled Treatment:    Reason Eval/Treat Not Completed: Medical issues which prohibited therapy  To get transfusion for 6.5 HGB. Will check back another time after infusion. Darice Potters PT Acute Rehabilitation Services Office [phone]  Potters Darice Norris 09/23/2023, 8:18 AM

## 2023-09-23 NOTE — Care Management Obs Status (Signed)
 MEDICARE OBSERVATION STATUS NOTIFICATION   Patient Details  Name: Heidi Lutz MRN: [id number] Date of Birth: 1940-03-01   Medicare Observation Status Notification Given:  Yes    Jon ONEIDA Anon, RN 09/23/2023, 1:07 PM

## 2023-09-24 DIAGNOSIS — S12001A Unspecified nondisplaced fracture of first cervical vertebra, initial encounter for closed fracture: Secondary | ICD-10-CM

## 2023-09-24 DIAGNOSIS — D649 Anemia, unspecified: Secondary | ICD-10-CM | POA: Diagnosis not present

## 2023-09-24 DIAGNOSIS — S12100A Unspecified displaced fracture of second cervical vertebra, initial encounter for closed fracture: Secondary | ICD-10-CM | POA: Diagnosis not present

## 2023-09-24 LAB — CBC
HCT: 25.5 % — ABNORMAL LOW (ref 36.0–46.0)
Hemoglobin: 8.1 g/dL — ABNORMAL LOW (ref 12.0–15.0)
MCH: 35.5 pg — ABNORMAL HIGH (ref 26.0–34.0)
MCHC: 31.8 g/dL (ref 30.0–36.0)
MCV: 111.8 fL — ABNORMAL HIGH (ref 80.0–100.0)
Platelets: 197 K/uL (ref 150–400)
RBC: 2.28 MIL/uL — ABNORMAL LOW (ref 3.87–5.11)
RDW: 22.2 % — ABNORMAL HIGH (ref 11.5–15.5)
WBC: 5.3 K/uL (ref 4.0–10.5)
nRBC: 0 % (ref 0.0–0.2)

## 2023-09-24 LAB — BASIC METABOLIC PANEL WITH GFR
Anion gap: 10 (ref 5–15)
BUN: 18 mg/dL (ref 8–23)
CO2: 23 mmol/L (ref 22–32)
Calcium: 8 mg/dL — ABNORMAL LOW (ref 8.9–10.3)
Chloride: 104 mmol/L (ref 98–111)
Creatinine, Ser: 1.31 mg/dL — ABNORMAL HIGH (ref 0.44–1.00)
GFR, Estimated: 40 mL/min — ABNORMAL LOW (ref >60)
Glucose, Bld: 96 mg/dL (ref 70–99)
Potassium: 4.2 mmol/L (ref 3.5–5.1)
Sodium: 137 mmol/L (ref 135–145)

## 2023-09-24 LAB — BILIRUBIN, FRACTIONATED(TOT/DIR/INDIR)
Bilirubin, Direct: 0.2 mg/dL (ref 0.0–0.2)
Indirect Bilirubin: 1 mg/dL — ABNORMAL HIGH (ref 0.3–0.9)
Total Bilirubin: 1.2 mg/dL (ref 0.0–1.2)

## 2023-09-24 NOTE — Plan of Care (Signed)

## 2023-09-24 NOTE — Evaluation (Signed)
 Physical Therapy Evaluation Patient Details Name: Heidi Lutz MRN: [id number] DOB: 02-16-1941 Today's Date: 09/24/2023  History of Present Illness  83 yo who resided at independent living with her husband at [place], was visiting at beach and fell . With hisotry of breast cancer and anemia, was found to have hemaglobin of 6.5 and C1 anc C2 fracture now requiring C-Collar at all times under Dr. Mavis neurology.  After units of blood yesterday, today 09/24/2023 it is now at 8.1 and she feels better.  Clinical Impression  Pt tolerated session well. Moving slower than usual and getting used to the neck collar/brace and neck pain when moving. Pt hase decreased tolerance to upright sitting after about 20 minutes unsupported . Pt able to tolerate walking with her rollator, and has good support when she return home to independent loving at [place]. Her husband and daughter discussed resources and clinic she can engage in if needed.   Educated with how to remove and replace neck brace, how to follow up with Dr. Mavis since she missed an appointment, how to address irritation under chin from brace and her scraps along her arom , and Lower extremities.   Good from a mobility standpoint from our point, if she does need to stay we will follow up to assure she is progressing while here in the hospital.  No follow up PT after DC at this time.       If plan is discharge home, recommend the following: A little help with walking and/or transfers;A little help with bathing/dressing/bathroom   Can travel by private vehicle        Equipment Recommendations    Recommendations for Other Services       Functional Status Assessment Patient has had a recent decline in their functional status and demonstrates the ability to make significant improvements in function in a reasonable and predictable amount of time.     Precautions / Restrictions Precautions Precautions: Cervical Precaution/Restrictions  Comments: pt also has scraps and bumps on R ight side of her face, and anterior of both knees and lower legs, and right shoulder . Pt have VERY THIN skin Required Braces or Orthoses: Cervical Brace Cervical Brace: Hard collar;At all times Restrictions Weight Bearing Restrictions Per Provider Order: No      Mobility  Bed Mobility Overal bed mobility: Needs Assistance Bed Mobility: Supine to Sit, Sit to Supine     Supine to sit: Mod assist Sit to supine: Mod assist   General bed mobility comments: due to neck pain, patient prefers to be helped by pulling up on the caregivers hands to long sitting then, scoots her LEs off the bed. She does this to sit up and to lay back down. And also prefers to be propped up on pillos and not lie flat    Transfers Overall transfer level: Needs assistance Equipment used: Rolling walker (2 wheels) Transfers: Sit to/from Stand Sit to Stand: Contact guard assist, Supervision           General transfer comment: CGA today to make sure she was not feeling dizzy, we were close by    Ambulation/Gait   Gait Distance (Feet): 15 Feet Assistive device: Rollator (4 wheels) Gait Pattern/deviations: Step-to pattern Gait velocity: slow     General Gait Details: tolerated being upright just a little weak from not moving around recently and as time increased her neck pain increaed in her upright unsupported posture.  Stairs  Wheelchair Mobility     Tilt Bed    Modified Rankin (Stroke Patients Only)       Balance Overall balance assessment: Needs assistance Sitting-balance support: No upper extremity supported Sitting balance-Leahy Scale: Good       Standing balance-Leahy Scale: Fair                               Pertinent Vitals/Pain Pain Assessment Pain Assessment: 0-10 Pain Score: 5  Pain Location: neck when she tries to move or sits up Pain Descriptors / Indicators: Aching, Sore Pain Intervention(s):  Limited activity within patient's tolerance, Monitored during session    Home Living Family/patient expects to be discharged to:: Other (Comment)                   Additional Comments: Indepedent Living apartment with her husband at [place] . They can get extra help if needed and go to the clinic for advice and medical issues.    Prior Function Prior Level of Function : Independent/Modified Independent             Mobility Comments: used rollator for walking , and husband assisted when needed       Extremity/Trunk Assessment        Lower Extremity Assessment Lower Extremity Assessment: Overall WFL for tasks assessed       Communication   Communication Communication: No apparent difficulties    Cognition Arousal: Alert Behavior During Therapy: WFL for tasks assessed/performed   PT - Cognitive impairments: No apparent impairments                         Following commands: Intact       Cueing Cueing Techniques: Verbal cues     General Comments      Exercises     Assessment/Plan    PT Assessment Patient needs continued PT services  PT Problem List Decreased strength;Decreased activity tolerance;Decreased mobility       PT Treatment Interventions Functional mobility training;Therapeutic activities;Balance training;Patient/family education    PT Goals (Current goals can be found in the Care Plan section)  Acute Rehab PT Goals Patient Stated Goal: I want to go home PT Goal Formulation: With patient/family Time For Goal Achievement: 10/01/23 Potential to Achieve Goals: Good    Frequency Min 3X/week     Co-evaluation               AM-PAC PT 6 Clicks Mobility  Outcome Measure Help needed turning from your back to your side while in a flat bed without using bedrails?: A Little Help needed moving from lying on your back to sitting on the side of a flat bed without using bedrails?: A Little Help needed moving to and from a  bed to a chair (including a wheelchair)?: A Little Help needed standing up from a chair using your arms (e.g., wheelchair or bedside chair)?: A Little Help needed to walk in hospital room?: A Little Help needed climbing 3-5 steps with a railing? : A Little 6 Click Score: 18    End of Session Equipment Utilized During Treatment: Gait belt Activity Tolerance: Patient tolerated treatment well Patient left: in bed;with call bell/phone within reach;with family/visitor present Nurse Communication: Mobility status PT Visit Diagnosis: Muscle weakness (generalized) (M62.81);Unsteadiness on feet (R26.81)    Time: 1100-1146 PT Time Calculation (min) (ACUTE ONLY): 46 min   Charges:   PT Evaluation $  PT Eval Low Complexity: 1 Low PT Treatments $Therapeutic Activity: 8-22 mins PT General Charges $$ ACUTE PT VISIT: 1 Visit         [employer], PT, MPT Acute Rehabilitation Services Office: [phone] If a weekend: secure chat groups: [facility] PT, [facility] OT, [facility] SLP 09/24/2023   Abdulrahim Siddiqi 09/24/2023, 1:52 PM

## 2023-09-24 NOTE — Discharge Summary (Signed)
 Physician Discharge Summary   Patient: Heidi Lutz MRN: [id number] DOB: Apr 18, 1940  Admit date:     09/22/2023  Discharge date: 09/24/23  Discharge Physician: Sabas GORMAN Brod   PCP: Burney Darice CROME, MD   Recommendations at discharge:   Follow-up gastroenterology as outpatient Follow-up with PCP in 2 weeks  Discharge Diagnoses: Principal Problem:   Symptomatic anemia  Resolved Problems:   * No resolved hospital problems. *  Hospital Course: 83 y.o. female with medical history significant for essential hypertension, CKD, generalized anxiety, breast cancer on tamoxifen  being admitted to the hospital for workup of sudden onset anemia.  Patient actually had a fall while visiting the beach with family about 1 week ago, states that she was feeling completely normal suddenly fell, hitting her face.  Denies any prodrome, denies feeling dizziness, palpitations, or chest pain prior.  Does not think that she passed out, denies any confusion or seizure-like activity, family including her husband and daughter who are at the bedside confirms this.  She presented for evaluation at the [facility] ER on 7/30, lab work on that day showed a hemoglobin of 7.2 reportedly according to her PCP her baseline hemoglobin last year was about 11.  She was also found on the day to have C1-C2 fracture, ER provider discussed with neurosurgery Dr. Mavis, who recommended c-collar which patient has been wearing and outpatient follow-up.  On that day, ER provider recommended observation admission but patient declined.  Patient followed up yesterday with her PCP, who insisted that she come to the ER for evaluation.  She eventually came to the ER for evaluation this morning.    Assessment and Plan:  Heidi Lutz is a 83 y.o. female with medical history significant for essential hypertension, CKD, generalized anxiety, breast cancer on tamoxifen  being admitted to the hospital for workup of sudden onset  anemia.    Symptomatic anemia-hemoglobin appears to be relatively stable, iron studies indicate possible anemia of chronic disease.  Given her history of breast cancer, this is concerning for possible metastatic disease, or perhaps MDS. -Hemoglobin dropped to 6.5 this morning.  S/p 1 unit PRBC -Hemoglobin has improved to 8.1 -FOBT was positive, [facility] consulted; plan for EGD as outpatient -Admitting provider discussed with Dr. Odean, recommends CT chest abdomen pelvis, if normal patient may require bone marrow biopsy -CT abdomen/pelvis/chest is unremarkable; showed nonspecific several subcentimeter hepatic hypodensities. -Also patient has large subcutaneous hematoma over the right temporalis; which could be the cause of patient's anemia - Hemoglobin is stable. -No plan for further investigations in the hospital -Will hold aspirin, avoid NSAIDs   Fall with injury-etiology of this is unclear, she denies significant weakness, orthopnea or dizziness prior to her fall.  May be related to her anemia. -PT evaluation obtained, no PT needs   History of breast cancer-no obvious evidence of recurrence, tolerating tamoxifen  well for the last 6 years.  She is followed by Dr. Gudena.   CKD stage III-renal function appears to be at baseline  C1-C2 fracture-continue neck collar, follow-up with neurosurgery as outpatient           Consultants: Gastroenterology Procedures performed:  Disposition: Home Diet recommendation:  Discharge Diet Orders (From admission, onward)     Start     Ordered   09/24/23 0000  Diet - low sodium heart healthy        09/24/23 1306           Regular diet DISCHARGE MEDICATION: Allergies as of  09/24/2023       Reactions   Fluorouracil Hives   Other Hives, Itching, Swelling, Rash, Other (See Comments)   NO -CILLIN(s)   Clavulanic Acid Other (See Comments)   Clavulanic acid is a medication that can be used in conjunction with amoxicillin to  manage and treat bacterial infections, specifically bacteria that are beta-lactamase producers. It is in the beta-lactamase inhibitor class of medications. Reaction undefined, but is allergic if it is paired with any -cillin(s)   Codeine Nausea And Vomiting   Hydrocodone  Nausea And Vomiting   Penicillins Hives, Swelling, Rash, Other (See Comments)   Pt states allergic to all cillin drugs. Joint pain. Has patient had a PCN reaction causing immediate rash, facial/tongue/throat swelling, SOB or lightheadedness with hypotension: Yes Has patient had a PCN reaction causing severe rash involving mucus membranes or skin necrosis: No Has patient had a PCN reaction that required hospitalization: No Has patient had a PCN reaction occurring within the last 10 years: No If all of the above answers are NO, then may proceed with Cephalosporin use.        Medication List     STOP taking these medications    Ecotrin Low Strength 81 MG tablet Generic drug: aspirin EC       TAKE these medications    albuterol  108 (90 Base) MCG/ACT inhaler Commonly known as: VENTOLIN  HFA Inhale 2 puffs into the lungs every 6 (six) hours as needed for wheezing or shortness of breath.   atorvastatin  20 MG tablet Commonly known as: LIPITOR Take 20 mg by mouth at bedtime.   benzonatate  200 MG capsule Commonly known as: TESSALON  Take 200 mg by mouth 3 (three) times daily as needed for cough.   Black Cohosh 540 MG Caps Take 540 mg by mouth daily.   Breztri  Aerosphere 160-9-4.8 MCG/ACT Aero inhaler Generic drug: budesonide -glycopyrrolate -formoterol  Inhale 2 puffs into the lungs 2 (two) times daily.   CALCIUM  + D3 PO Take 1 tablet by mouth 2 (two) times daily.   calcium  gluconate 500 MG tablet Take 1 tablet (500 mg total) by mouth 2 (two) times daily.   cetirizine 10 MG tablet Commonly known as: ZYRTEC Take 10 mg by mouth daily.   clobetasol cream 0.05 % Commonly known as: TEMOVATE Apply 1  Application topically See admin instructions. Apply to affected areas 2 times a day   CRANBERRY PO Take 1 capsule by mouth daily.   Diclofenac Sodium 3 % Gel Apply 2 g topically 4 (four) times daily as needed (for pain- affected sites).   estradiol  0.1 MG/GM vaginal cream Commonly known as: ESTRACE  Place 1 Applicatorful vaginally at bedtime.   famotidine  40 MG tablet Commonly known as: PEPCID  Take 40 mg by mouth at bedtime.   fosfomycin 3 g Pack Commonly known as: MONUROL  Take 1 packet by mouth every 10 days What changed:  how much to take how to take this when to take this additional instructions   Glucosamine-Chondroitin Max St Caps Take 1 capsule by mouth daily.   HYDROcodone -acetaminophen  5-325 MG tablet Commonly known as: NORCO/VICODIN Take 1 tablet by mouth See admin instructions. Take 1 tablet by mouth twice a day, alternating with 1,300 mg (of) Tylenol    Kerendia 10 MG Tabs Generic drug: Finerenone Take 10 mg by mouth in the morning.   lidocaine  5 % Commonly known as: LIDODERM  APPLY 2 PATCHES TO SKIN EVERY DAY, REMOVE AND REPLACE PATCH AFTER 12 HRS   Magnesium 200 MG Tabs Take 200 mg by mouth  daily.   multivitamin Tabs tablet Take 1 tablet by mouth daily.   NIFEdipine  90 MG 24 hr tablet Commonly known as: PROCARDIA  XL/NIFEDICAL-XL Take 90 mg by mouth daily.   pregabalin  50 MG capsule Commonly known as: LYRICA  Take 50 mg by mouth 3 (three) times daily.   PROBIOTIC ADVANCED PO Take 1 capsule by mouth daily.   Prolia 60 MG/ML Sosy injection Generic drug: denosumab Inject 60 mg into the skin every 6 (six) months.   solifenacin  10 MG tablet Commonly known as: VESICARE  Take 1 tablet (10 mg total) by mouth every evening.   tamoxifen  20 MG tablet Commonly known as: NOLVADEX  TAKE 1 TABLET EVERY DAY What changed: when to take this   traMADol  50 MG tablet Commonly known as: ULTRAM  Take 1 tablet (50 mg total) by mouth every 6 (six) hours as  needed. What changed:  reasons to take this Another medication with the same name was removed. Continue taking this medication, and follow the directions you see here.   Tylenol  8 Hour Arthritis Pain 650 MG CR tablet Generic drug: acetaminophen  Take 1,300 mg by mouth See admin instructions. Take 1,300 mg by mouth twice a day, alternating with Norco 5-325        Follow-up Information     Brahmbhatt, Parag, MD. Schedule an appointment as soon as possible for a visit.   Specialty: Gastroenterology Contact information: [address] [place] [address] [phone]         Burney Darice CROME, MD Follow up in 1 week(s).   Specialty: Family Medicine Contact information: [address] [place] [address] [phone]                Discharge Exam: Heidi Lutz   09/22/23 1134  Weight: 53.5 kg   General-appears in no acute distress Heart-S1-S2, regular, no murmur auscultated Lungs-clear to auscultation bilaterally, no wheezing or crackles auscultated Abdomen-soft, nontender, no organomegaly Extremities-no edema in the lower extremities Neuro-alert, oriented x3, no focal deficit noted  Condition at discharge: good  The results of significant diagnostics from this hospitalization (including imaging, microbiology, ancillary and laboratory) are listed below for reference.   Imaging Studies: CT CHEST ABDOMEN PELVIS W CONTRAST Result Date: 09/22/2023 CLINICAL DATA:  Metastatic disease evaluation Pt reports having a hgb of 7.2 per her provider. Pt had a fall a week ago and has injuries to her face and neck. EXAM: CT CHEST, ABDOMEN, AND PELVIS WITH CONTRAST TECHNIQUE: Multidetector CT imaging of the chest, abdomen and pelvis was performed following the standard protocol during bolus administration of intravenous contrast. RADIATION DOSE REDUCTION: This exam was performed according to the departmental dose-optimization program which includes  automated exposure control, adjustment of the mA and/or kV according to patient size and/or use of iterative reconstruction technique. CONTRAST:  80mL OMNIPAQUE  IOHEXOL  300 MG/ML  SOLN COMPARISON:  CT chest 05/04/2018 FINDINGS: CHEST: Cardiovascular: No aortic injury. The thoracic aorta is normal in caliber. The heart is normal in size. No significant pericardial effusion. Severe atherosclerotic plaque. Aortic valve leaflet calcification. Four-vessel coronary artery calcification. Mediastinum/Nodes: No pneumomediastinum. No mediastinal hematoma. The esophagus is unremarkable. The thyroid is unremarkable. The central airways are patent. No mediastinal, hilar, or axillary lymphadenopathy. Lungs/Pleura: No focal consolidation. No pulmonary nodule. No pulmonary mass. No pulmonary contusion or laceration. No pneumatocele formation. No pleural effusion. No pneumothorax. No hemothorax. Musculoskeletal/Chest wall: No chest wall mass. Left breast surgical changes with chronic 4 x 1.7 cm fluid density lesion (2:38).  No acute rib or sternal fracture. Old nonunionized healed left rib fracture. No spinal fracture. Total reverse left shoulder arthroplasty. Severe degenerative changes of the right shoulder. Fluid within the right shoulder bursa. ABDOMEN / PELVIS: Hepatobiliary: Not enlarged. Nonspecific several subcentimeter hypodensities. No laceration or subcapsular hematoma. The gallbladder is otherwise unremarkable with no radio-opaque gallstones. No biliary ductal dilatation. Pancreas: Normal pancreatic contour. No main pancreatic duct dilatation. Spleen: Not enlarged. No focal lesion. No laceration, subcapsular hematoma, or vascular injury. Adrenals/Urinary Tract: No nodularity bilaterally. Bilateral kidneys enhance symmetrically. Bilateral renal cortical scarring. No hydronephrosis. No contusion, laceration, or subcapsular hematoma. No injury to the vascular structures or collecting systems. No hydroureter. The urinary  bladder is unremarkable. Stomach/Bowel: No small or large bowel wall thickening or dilatation. Colonic diverticulosis. The appendix is not definitely identified with no inflammatory changes in the right lower quadrant to suggest acute appendicitis. Vasculature/Lymphatics: Severe atherosclerotic plaque. No abdominal aorta or iliac aneurysm. No active contrast extravasation or pseudoaneurysm. No abdominal, pelvic, inguinal lymphadenopathy. Reproductive: Status post hysterectomy.  No adnexal mass. Other: No simple free fluid ascites. No pneumoperitoneum. No hemoperitoneum. No mesenteric hematoma identified. No organized fluid collection. Musculoskeletal: No significant soft tissue hematoma. Seventy fat containing umbilical hernia. No acute pelvic fracture. No spinal fracture. Multilevel severe degenerative changes of the spine with intervertebral disc space vacuum phenomenon. Old superior endplate L2 compression fracture. Other ports and devices: None. IMPRESSION: 1. No acute intrathoracic, intra-abdominal, intrapelvic traumatic injury. 2. No acute fracture or traumatic malalignment of the thoracic or lumbar spine. 3. Other imaging findings of potential clinical significance: No findings of malignancy or metastases. Nonspecific several subcentimeter hepatic hypodensities. Colonic diverticulosis with no acute diverticulitis. Severe right shoulder degenerative changes with likely bursitis. Aortic Atherosclerosis (ICD10-I70.0) -severe, including coronary artery and aortic valve leaflet (correlate for aortic stenosis). Leftbreast surgical changes with chronic 4 x 1.7 cm fluid density lesion Electronically Signed   By: Morgane  Naveau M.D.   On: 09/22/2023 19:26   CT CERVICAL SPINE WO CONTRAST Addendum Date: 09/20/2023 ADDENDUM REPORT: 09/20/2023 12:26 ADDENDUM: Study discussed by telephone with Ruthe Cornet, DO on 09/20/2023 at 1212 hours. Electronically Signed   By: VEAR Hurst M.D.   On: 09/20/2023 12:26   Result Date:  09/20/2023 CLINICAL DATA:  83 year old female status post fall while walking to bathroom 1 week ago. Continued pain and bruising. EXAM: CT CERVICAL SPINE WITHOUT CONTRAST TECHNIQUE: Multidetector CT imaging of the cervical spine was performed without intravenous contrast. Multiplanar CT image reconstructions were also generated. RADIATION DOSE REDUCTION: This exam was performed according to the departmental dose-optimization program which includes automated exposure control, adjustment of the mA and/or kV according to patient size and/or use of iterative reconstruction technique. COMPARISON:  Head and face CT today reported separately. FINDINGS: Alignment: Abnormal C1-C2 alignment, detailed below. Underlying dextroconvex upper and levoconvex lower cervical scoliosis. Degenerative anterolisthesis of C3 on C4 measuring up to 5 mm. 3 mm of degenerative anterolisthesis of C4 on C5 and C7 on T1. Skull base and vertebrae: Visualized skull base is intact. No atlanto-occipital dissociation. Oblique minimally displaced fracture through the right anterior C1 ring on series 609, image 17 and coronal image 47. Superimposed comminuted type 2 odontoid fracture, left lateral displaced odontoid by 3-4 mm (coronal image 45). Similar anterior displacement of the odontoid fragment by up to 3 mm. Anterior C1-odontoid alignment relatively maintained. C2 body, pedicles and posterior elements intact. Superimposed degenerative bilateral C2-C3 facet ankylosis. No other acute cervical spine fracture identified, but additional degenerative cervical  ankylosis detailed below. Soft tissues and spinal canal: No visible canal hematoma. Mild prevertebral soft tissue swelling at the C2 level on series 4, image 24. Disc levels: Degenerative ankylosis of C2-C3, C5-C6, and developing at C6-C7 and C7-T1. Degenerative spondylolisthesis of the unfused C4-C5 level, and also C3-C4 - although evidence of developing right side facet ankylosis at that level  also. Upper chest: Advanced upper thoracic spine disc and endplate degeneration. Visible upper thoracic levels appear intact. Negative lung apices. Calcified aortic atherosclerosis. IMPRESSION: 1. Positive for acute anterior C1 ring and type 2 Odontoid fractures, the latter displaced up to 3 mm. 2. No other acute cervical spine fracture identified. Underlying widespread degenerative cervical spinal ankylosis, and degenerative spondylolisthesis at the levels not currently fused. 3. Head and face CT reported separately. Electronically Signed: By: VEAR Hurst M.D. On: 09/20/2023 12:07   DG Shoulder Right Result Date: 09/20/2023 CLINICAL DATA:  83 year old female status post fall while walking to bathroom 1 week ago. Continued pain and bruising. EXAM: RIGHT SHOULDER - 2+ VIEW COMPARISON:  CT chest 04/20/2022. FINDINGS: Three views 1148 hours. Superior subluxation of the right humeral head. No glenohumeral joint dislocation. Proximal right humerus intact. Advanced degeneration also at the right [facility] joint. But the right clavicle and scapula appear to be intact. Negative visible right lung. No acute right rib fracture identified. IMPRESSION: Chronic severe degeneration at the right shoulder but no acute fracture or dislocation identified. Electronically Signed   By: VEAR Hurst M.D.   On: 09/20/2023 12:09   CT Maxillofacial Wo Contrast Result Date: 09/20/2023 CLINICAL DATA:  83 year old female status post fall while walking to bathroom 1 week ago. Continued pain and bruising. EXAM: CT MAXILLOFACIAL WITHOUT CONTRAST TECHNIQUE: Multidetector CT imaging of the maxillofacial structures was performed. Multiplanar CT image reconstructions were also generated. RADIATION DOSE REDUCTION: This exam was performed according to the departmental dose-optimization program which includes automated exposure control, adjustment of the mA and/or kV according to patient size and/or use of iterative reconstruction technique. COMPARISON:  Head and  cervical spine today reported separately. FINDINGS: Osseous: Upper cervical spine fractures reported separately. Mandible intact and normally located. TMJ degeneration greater on the left. No acute dental finding identified. Bilateral maxilla, zygoma, pterygoid, nasal bones appear intact. Visible calvarium intact. Visible central skull base intact. Orbits: Intact orbital walls. Postoperative changes to both globes. Orbits soft tissues otherwise normal. Sinuses: Visualized paranasal sinuses and mastoids are clear. Soft tissues: Negative visible noncontrast larynx, pharynx, parapharyngeal spaces, sublingual space, submandibular spaces, masticator and parotid spaces. Large right lateral face/lateral orbit intermediate to hyperdense subcutaneous mass overlying the lower right temporalis muscle is up to 4 cm in length and 15 mm in thickness. This is nonspecific but hematoma is favored. Limited intracranial: Reported separately. IMPRESSION: 1. Upper Cervical Spine Fractures, reported separately. 2. Large up to 4 cm length, 1.5 cm thick subcutaneous mass overlying the lower right temporalis area is nonspecific but favor Hematoma. 3. No acute facial fracture identified. Electronically Signed   By: VEAR Hurst M.D.   On: 09/20/2023 12:01   CT HEAD WO CONTRAST Result Date: 09/20/2023 CLINICAL DATA:  83 year old female status post fall while walking to bathroom 1 week ago. Continued pain and bruising. EXAM: CT HEAD WITHOUT CONTRAST TECHNIQUE: Contiguous axial images were obtained from the base of the skull through the vertex without intravenous contrast. RADIATION DOSE REDUCTION: This exam was performed according to the departmental dose-optimization program which includes automated exposure control, adjustment of the mA and/or kV according to  patient size and/or use of iterative reconstruction technique. COMPARISON:  Face and cervical spine CT today reported separately. FINDINGS: Brain: No midline shift, ventriculomegaly, mass  effect, evidence of mass lesion, intracranial hemorrhage or evidence of cortically based acute infarction. Patchy mild to moderate for age bilateral cerebral white matter hypodensity. No cortical encephalomalacia identified. Vascular: Calcified atherosclerosis at the skull base. No suspicious intracranial vascular hyperdensity. Skull: Upper cervical spine fractures, see cervical spine CT reported separately. No skull fracture identified. Face CT also reported separately. Sinuses/Orbits: Visualized paranasal sinuses and mastoids are clear. Other: Large right lateral orbit, temporalis region up to 15 mm thick hyperdense mass or collection in the subcutaneous soft tissues, favor hematoma in this setting. Orbits soft tissues appear negative. IMPRESSION: 1. Cervical spine fractures, see Cervical Spine CT reported separately. 2. Large hyperdense subcutaneous mass overlying the right temporalis, favor hematoma. No skull fracture identified. 3. No acute intracranial abnormality. Electronically Signed   By: VEAR Hurst M.D.   On: 09/20/2023 11:58    Microbiology: Results for orders placed or performed in visit on 05/10/23  Microscopic Examination     Status: Abnormal   Collection Time: 05/10/23 12:00 AM   Urine  Result Value Ref Range Status   WBC, UA 0-5 0 - 5 /hpf Final   RBC, Urine 0-2 0 - 2 /hpf Final   Epithelial Cells (non renal) 0-10 0 - 10 /hpf Final   Renal Epithel, UA 0-10 None seen /hpf Final   Casts Present None seen /lpf Final   Cast Type Waxy casts (A) N/A Final   Bacteria, UA Few None seen/Few Final    Labs: CBC: Recent Labs  Lab 09/20/23 1201 09/22/23 1136 09/23/23 0522 09/23/23 1605 09/24/23 0445  WBC 6.0 5.3 4.5  --  5.3  NEUTROABS 3.5 3.0  --   --   --   HGB 7.2* 7.6* 6.5* 8.1* 8.1*  HCT 21.6* 24.1* 20.8* 26.1* 25.5*  MCV 114.9* 117.6* 119.5*  --  111.8*  PLT 190 229 197  --  197   Basic Metabolic Panel: Recent Labs  Lab 09/20/23 1201 09/22/23 1136 09/23/23 0522  09/24/23 0445  NA 133* 134* 136 137  K 4.6 4.2 4.3 4.2  CL 98 101 104 104  CO2 22 24 23 23   GLUCOSE 115* 145* 99 96  BUN 23 19 18 18   CREATININE 1.41* 1.21* 1.36* 1.31*  CALCIUM  9.0 8.8* 8.4* 8.0*   Liver Function Tests: Recent Labs  Lab 09/20/23 1201 09/22/23 1136 09/24/23 0445  AST 50* 34  --   ALT 11 15  --   ALKPHOS 45 41  --   BILITOT 0.8 1.3* 1.2  PROT 6.9 7.2  --   ALBUMIN 3.9 3.4*  --    CBG: Recent Labs  Lab 09/20/23 1153  GLUCAP 121*    Discharge time spent: greater than 30 minutes.  Signed: Sabas GORMAN Brod, MD [facility] 09/24/2023

## 2023-09-25 LAB — TYPE AND SCREEN
ABO/RH(D): O POS
Antibody Screen: NEGATIVE
Unit division: 0

## 2023-09-25 LAB — BPAM RBC
Blood Product Expiration Date: 202509032359
ISSUE DATE / TIME: 202508021023
Unit Type and Rh: 5100

## 2023-10-18 ENCOUNTER — Encounter (HOSPITAL_BASED_OUTPATIENT_CLINIC_OR_DEPARTMENT_OTHER): Payer: Self-pay | Admitting: Emergency Medicine

## 2023-10-18 ENCOUNTER — Observation Stay (HOSPITAL_BASED_OUTPATIENT_CLINIC_OR_DEPARTMENT_OTHER)
Admission: EM | Admit: 2023-10-18 | Discharge: 2023-10-22 | Disposition: A | Discharge status: Home Health | Attending: Internal Medicine | Admitting: Internal Medicine

## 2023-10-18 ENCOUNTER — Emergency Department (HOSPITAL_BASED_OUTPATIENT_CLINIC_OR_DEPARTMENT_OTHER)

## 2023-10-18 ENCOUNTER — Other Ambulatory Visit: Payer: Self-pay

## 2023-10-18 DIAGNOSIS — D6481 Anemia due to antineoplastic chemotherapy: Secondary | ICD-10-CM | POA: Insufficient documentation

## 2023-10-18 DIAGNOSIS — Z96653 Presence of artificial knee joint, bilateral: Secondary | ICD-10-CM | POA: Diagnosis not present

## 2023-10-18 DIAGNOSIS — Z87440 Personal history of urinary (tract) infections: Secondary | ICD-10-CM | POA: Diagnosis not present

## 2023-10-18 DIAGNOSIS — S12100D Unspecified displaced fracture of second cervical vertebra, subsequent encounter for fracture with routine healing: Secondary | ICD-10-CM | POA: Insufficient documentation

## 2023-10-18 DIAGNOSIS — L89313 Pressure ulcer of right buttock, stage 3: Secondary | ICD-10-CM | POA: Diagnosis not present

## 2023-10-18 DIAGNOSIS — Z79899 Other long term (current) drug therapy: Secondary | ICD-10-CM | POA: Insufficient documentation

## 2023-10-18 DIAGNOSIS — E785 Hyperlipidemia, unspecified: Secondary | ICD-10-CM | POA: Insufficient documentation

## 2023-10-18 DIAGNOSIS — L89322 Pressure ulcer of left buttock, stage 2: Secondary | ICD-10-CM | POA: Diagnosis not present

## 2023-10-18 DIAGNOSIS — W010XXD Fall on same level from slipping, tripping and stumbling without subsequent striking against object, subsequent encounter: Secondary | ICD-10-CM | POA: Diagnosis not present

## 2023-10-18 DIAGNOSIS — E782 Mixed hyperlipidemia: Secondary | ICD-10-CM | POA: Diagnosis present

## 2023-10-18 DIAGNOSIS — R4182 Altered mental status, unspecified: Secondary | ICD-10-CM | POA: Diagnosis present

## 2023-10-18 DIAGNOSIS — L89150 Pressure ulcer of sacral region, unstageable: Secondary | ICD-10-CM | POA: Diagnosis not present

## 2023-10-18 DIAGNOSIS — Z96612 Presence of left artificial shoulder joint: Secondary | ICD-10-CM | POA: Insufficient documentation

## 2023-10-18 DIAGNOSIS — I10 Essential (primary) hypertension: Secondary | ICD-10-CM | POA: Diagnosis present

## 2023-10-18 DIAGNOSIS — D649 Anemia, unspecified: Secondary | ICD-10-CM | POA: Diagnosis present

## 2023-10-18 DIAGNOSIS — I447 Left bundle-branch block, unspecified: Secondary | ICD-10-CM | POA: Diagnosis not present

## 2023-10-18 DIAGNOSIS — G9340 Encephalopathy, unspecified: Secondary | ICD-10-CM | POA: Diagnosis not present

## 2023-10-18 DIAGNOSIS — S12000D Unspecified displaced fracture of first cervical vertebra, subsequent encounter for fracture with routine healing: Secondary | ICD-10-CM | POA: Diagnosis not present

## 2023-10-18 DIAGNOSIS — C50112 Malignant neoplasm of central portion of left female breast: Secondary | ICD-10-CM | POA: Diagnosis not present

## 2023-10-18 LAB — URINE DRUG SCREEN
Amphetamines: NEGATIVE
Barbiturates: NEGATIVE
Benzodiazepines: NEGATIVE
Cocaine: NEGATIVE
Fentanyl: NEGATIVE
Methadone Scn, Ur: NEGATIVE
Opiates: POSITIVE — AB
Tetrahydrocannabinol: NEGATIVE

## 2023-10-18 LAB — I-STAT VENOUS BLOOD GAS, ED
Acid-Base Excess: 1 mmol/L (ref 0.0–2.0)
Bicarbonate: 25.9 mmol/L (ref 20.0–28.0)
Calcium, Ion: 1.29 mmol/L (ref 1.15–1.40)
HCT: 21 % — ABNORMAL LOW (ref 36.0–46.0)
Hemoglobin: 7.1 g/dL — ABNORMAL LOW (ref 12.0–15.0)
O2 Saturation: 56 %
Patient temperature: 98.9
Potassium: 3.6 mmol/L (ref 3.5–5.1)
Sodium: 134 mmol/L — ABNORMAL LOW (ref 135–145)
TCO2: 27 mmol/L (ref 22–32)
pCO2, Ven: 43.8 mmHg — ABNORMAL LOW (ref 44–60)
pH, Ven: 7.381 (ref 7.25–7.43)
pO2, Ven: 30 mmHg — CL (ref 32–45)

## 2023-10-18 LAB — URINALYSIS, ROUTINE W REFLEX MICROSCOPIC
Bilirubin Urine: NEGATIVE
Glucose, UA: NEGATIVE mg/dL
Hgb urine dipstick: NEGATIVE
Ketones, ur: NEGATIVE mg/dL
Nitrite: NEGATIVE
Protein, ur: NEGATIVE mg/dL
Specific Gravity, Urine: <=1.005 (ref 1.005–1.030)
pH: 6 (ref 5.0–8.0)

## 2023-10-18 LAB — AMMONIA: Ammonia: <13 umol/L (ref 9–35)

## 2023-10-18 LAB — URINALYSIS, MICROSCOPIC (REFLEX): WBC, UA: >50 WBC/hpf (ref 0–5)

## 2023-10-18 LAB — COMPREHENSIVE METABOLIC PANEL WITH GFR
ALT: <5 U/L (ref 0–44)
AST: 16 U/L (ref 15–41)
Albumin: 3.7 g/dL (ref 3.5–5.0)
Alkaline Phosphatase: 38 U/L (ref 38–126)
Anion gap: 15 (ref 5–15)
BUN: 39 mg/dL — ABNORMAL HIGH (ref 8–23)
CO2: 24 mmol/L (ref 22–32)
Calcium: 9.7 mg/dL (ref 8.9–10.3)
Chloride: 97 mmol/L — ABNORMAL LOW (ref 98–111)
Creatinine, Ser: 1.71 mg/dL — ABNORMAL HIGH (ref 0.44–1.00)
GFR, Estimated: 29 mL/min — ABNORMAL LOW (ref >60)
Glucose, Bld: 131 mg/dL — ABNORMAL HIGH (ref 70–99)
Potassium: 3.8 mmol/L (ref 3.5–5.1)
Sodium: 135 mmol/L (ref 135–145)
Total Bilirubin: 0.6 mg/dL (ref 0.0–1.2)
Total Protein: 7.2 g/dL (ref 6.5–8.1)

## 2023-10-18 LAB — CBC
HCT: 23.8 % — ABNORMAL LOW (ref 36.0–46.0)
Hemoglobin: 7.8 g/dL — ABNORMAL LOW (ref 12.0–15.0)
MCH: 35.5 pg — ABNORMAL HIGH (ref 26.0–34.0)
MCHC: 32.8 g/dL (ref 30.0–36.0)
MCV: 108.2 fL — ABNORMAL HIGH (ref 80.0–100.0)
Platelets: 181 K/uL (ref 150–400)
RBC: 2.2 MIL/uL — ABNORMAL LOW (ref 3.87–5.11)
RDW: 20.3 % — ABNORMAL HIGH (ref 11.5–15.5)
WBC: 8.2 K/uL (ref 4.0–10.5)
nRBC: 0 % (ref 0.0–0.2)

## 2023-10-18 LAB — TROPONIN T, HIGH SENSITIVITY
Troponin T High Sensitivity: 39 ng/L — ABNORMAL HIGH (ref 0–19)
Troponin T High Sensitivity: 34 ng/L — ABNORMAL HIGH (ref 0–19)

## 2023-10-18 LAB — CBG MONITORING, ED: Glucose-Capillary: 125 mg/dL — ABNORMAL HIGH (ref 70–99)

## 2023-10-18 LAB — LACTIC ACID, PLASMA: Lactic Acid, Venous: 1.1 mmol/L (ref 0.5–1.9)

## 2023-10-18 LAB — MAGNESIUM: Magnesium: 2.2 mg/dL (ref 1.7–2.4)

## 2023-10-18 MED ORDER — LACTATED RINGERS IV BOLUS
1000.0000 mL | Freq: Once | INTRAVENOUS | Status: AC
  Administered 2023-10-18: 1000 mL via INTRAVENOUS

## 2023-10-18 MED ORDER — NALOXONE HCL 2 MG/2ML IJ SOSY
PREFILLED_SYRINGE | INTRAMUSCULAR | Status: AC
  Administered 2023-10-18: 1 mg via INTRAVENOUS
  Filled 2023-10-18: qty 2

## 2023-10-18 MED ORDER — NALOXONE HCL 2 MG/2ML IJ SOSY: 1.0000 mg | PREFILLED_SYRINGE | Freq: Once | INTRAMUSCULAR | Status: AC

## 2023-10-18 MED ORDER — LEVOFLOXACIN IN D5W 500 MG/100ML IV SOLN
500.0000 mg | Freq: Once | INTRAVENOUS | Status: AC
  Administered 2023-10-19: 500 mg via INTRAVENOUS
  Filled 2023-10-18: qty 100

## 2023-10-18 MED ORDER — NALOXONE HCL 0.4 MG/ML IJ SOLN
0.4000 mg | Freq: Once | INTRAMUSCULAR | Status: AC
  Administered 2023-10-18: 0.4 mg via INTRAVENOUS
  Filled 2023-10-18: qty 1

## 2023-10-18 MED ORDER — LACTATED RINGERS IV SOLN: INTRAVENOUS | Status: DC

## 2023-10-18 NOTE — Plan of Care (Signed)
 Plan of Care Note for accepted transfer   Patient name: Heidi Lutz FMW:[id number] DOB: 1941/01/27  Facility requesting transfer: [facility] ED. Requesting Provider: Dr. Armenta Facility course: 83 year old female with history of hypertension, hyperlipidemia, rheumatoid arthritis, CKD stage III, breast cancer, recent C1-C2 fracture in c-collar and taking gabapentin  and Vicodin presented with altered mental status/somnolence. Temperature 100 F on arrival and tachycardic to the 110s with blood pressure 92/52. Hypoxic to the mid 80s on room air requiring up to 4 L supplemental oxygen initially.  Patient was given Narcan .  Now weaned down to 2 L [place].   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  VBG without evidence of hypercarbia, initial troponin 39 and repeat pending, lactate normal, no leukocytosis, no elevation of ammonia level, no hypoglycemia, UA pending, creatinine 1.7 which is above baseline, hemoglobin 7.8 and was previously 8.1 on labs 3 weeks ago.  Chest x-ray showing infiltration or atelectasis in the left lung base.  CT head showing no acute intracranial abnormality.  CT C-spine showing acute minimally displaced left posterior arch C1 fracture and subacute to chronic nonunion denies right anterior arch C1 fracture.  Showing improved alignment of a subacute to chronic type II dens fracture with 1 mm posterior displacement.  Blood pressure and heart rate improved after 1 L IV fluids.  Plan of care: The patient is accepted for admission to Progressive unit at [facility].  [facility] will assume care on arrival to accepting facility. Until arrival, care as per EDP. However, [facility] available 24/7 for questions and assistance.  Check [email or web address] for on-call coverage.  Nursing staff, please call [facility] Admits & Consults System-Wide number under Amion on patient's arrival so appropriate admitting provider can evaluate the pt.

## 2023-10-18 NOTE — ED Triage Notes (Signed)
 Pt presents with family for AMS, family reports pt has had multiple falls with fractures,pain has increased in left side of head since Friday, saw Darice Gunner and was prescribed mirtazapine on Monday, increased gabapentin  from 2 tabs to 3, family reports increased AMS since med changes, inability to walk; has called PCP with no return call

## 2023-10-18 NOTE — ED Provider Notes (Signed)
 [place] [facility] AT [facility] Provider Note   CSN: [id number] Arrival date & time: 10/18/23  1850     Patient presents with: Altered Mental Status   Heidi Lutz is a 83 y.o. female.   HPI Patient presented to the emergency department [phone] about a week after an unwitnessed fall in the bathroom.  She was experiencing ongoing neck pain at that time was identified to have C1-C2 fracture.  The patient was placed in the [place]en cervical collar for outpatient management.  On the following day, the patient was admitted for anemia with hemoglobin 7.2.  Patient had blood transfusion and plan was for outpatient EGD and consideration given to possible anemia of chronic disease.  Since the patient's hospitalization she has been taking gabapentin  and Vicodin for pain control.  Family members reports she was doing pretty well at baseline and interactive as of last Friday but was having a lot of pain on the left side and was seen by her outpatient provider.  Patient was started on mirtazapine 1 day and her gabapentin  dose was increased from 2 to 3 tablets which she is taking 3 times a day.  Family members noticed that after these changes patient started to become much more confused and somnolent.  They noticed she was starting to pick at things and was not at her usual level of alertness and interactivity.  This progressed and today they present for evaluation for altered mental status.  Now she is too confused and somnolent to get up and walk independently which she had been doing last week.  They denies she has had any new fall since this change in her mental status.  They report she does bump her arms and legs on a lot of things and bruises very easily, but has not had another fall to the ground.    Prior to Admission medications   Medication Sig Start Date End Date Taking? Authorizing Provider  albuterol  (VENTOLIN  HFA) 108 (90 Base) MCG/ACT inhaler Inhale 2 puffs into  the lungs every 6 (six) hours as needed for wheezing or shortness of breath.    [provider]  atorvastatin  (LIPITOR) 20 MG tablet Take 20 mg by mouth at bedtime.    [provider]  benzonatate  (TESSALON ) 200 MG capsule Take 200 mg by mouth 3 (three) times daily as needed for cough.    [provider]  Black Cohosh 540 MG CAPS Take 540 mg by mouth daily.     [provider]  BREZTRI  AEROSPHERE 160-9-4.8 MCG/ACT AERO inhaler Inhale 2 puffs into the lungs 2 (two) times daily. 05/15/23   [provider]  Calcium  Carb-Cholecalciferol (CALCIUM  + D3 PO) Take 1 tablet by mouth 2 (two) times daily.    [provider]  calcium  gluconate 500 MG tablet Take 1 tablet (500 mg total) by mouth 2 (two) times daily. Patient not taking: Reported on 09/22/2023 04/19/21   Gudena, Vinay, MD  cetirizine (ZYRTEC) 10 MG tablet Take 10 mg by mouth daily.    [provider]  clobetasol cream (TEMOVATE) 0.05 % Apply 1 Application topically See admin instructions. Apply to affected areas 2 times a day    [provider]  CRANBERRY PO Take 1 capsule by mouth daily.     [provider]  denosumab (PROLIA) 60 MG/ML SOSY injection Inject 60 mg into the skin every 6 (six) months.    [provider]  Diclofenac Sodium 3 % GEL Apply 2 g  topically 4 (four) times daily as needed (for pain- affected sites).    [provider]  estradiol  (ESTRACE ) 0.1 MG/GM vaginal cream Place 1 Applicatorful vaginally at bedtime. 06/26/22   [provider]  famotidine  (PEPCID ) 40 MG tablet Take 40 mg by mouth at bedtime. 04/17/23   [provider]  fosfomycin (MONUROL ) 3 g PACK Take 1 packet by mouth every 10 days Patient taking differently: Take 3 g by mouth See admin instructions. Take 3 grams (1 packet) by mouth every 10 days 05/10/23   Stoneking, Adine PARAS., MD  Glucosamine-Chondroit-Vit C-Mn (GLUCOSAMINE-CHONDROITIN MAX ST) CAPS Take 1  capsule by mouth daily.     [provider]  HYDROcodone -acetaminophen  (NORCO/VICODIN) 5-325 MG tablet Take 1 tablet by mouth See admin instructions. Take 1 tablet by mouth twice a day, alternating with 1,300 mg (of) Tylenol     [provider]  KERENDIA  10 MG TABS Take 10 mg by mouth in the morning.    [provider]  lidocaine  (LIDODERM ) 5 % APPLY 2 PATCHES TO SKIN EVERY DAY, REMOVE AND REPLACE PATCH AFTER 12 HRS 08/13/20   Raulkar, Sven SQUIBB, MD  Magnesium  200 MG TABS Take 200 mg by mouth daily.     [provider]  multivitamin (RENA-VIT) TABS tablet Take 1 tablet by mouth daily. 05/01/23   [provider]  NIFEdipine  (PROCARDIA  XL/ADALAT -CC) 90 MG 24 hr tablet Take 90 mg by mouth daily.     [provider]  pregabalin  (LYRICA ) 50 MG capsule Take 50 mg by mouth 3 (three) times daily.    [provider]  Probiotic Product (PROBIOTIC ADVANCED PO) Take 1 capsule by mouth daily.     [provider]  solifenacin  (VESICARE ) 10 MG tablet Take 1 tablet (10 mg total) by mouth every evening. 05/10/23   Stoneking, Adine PARAS., MD  tamoxifen  (NOLVADEX ) 20 MG tablet TAKE 1 TABLET EVERY DAY Patient taking differently: Take 20 mg by mouth in the morning. 08/18/23   Odean Potts, MD  traMADol  (ULTRAM ) 50 MG tablet Take 1 tablet (50 mg total) by mouth every 6 (six) hours as needed. Patient taking differently: Take 50 mg by mouth every 6 (six) hours as needed (for pain). 09/20/23   Francis Ileana SAILOR, PA-C  TYLENOL  8 HOUR ARTHRITIS PAIN 650 MG CR tablet Take 1,300 mg by mouth See admin instructions. Take 1,300 mg by mouth twice a day, alternating with Norco 5-325    [provider]    Allergies: Fluorouracil, Other, Clavulanic acid, Codeine, Hydrocodone , and Penicillins    Review of Systems  Updated Vital Signs BP (!) 95/47   Pulse 86   Temp 98.9 F (37.2 C) (Oral)   Resp (!) 22   Ht 5' 2 (1.575 m)   Wt 53.5 kg   LMP  (LMP  Unknown)   SpO2 91%   BMI 21.58 kg/m   Physical Exam Constitutional:      Comments: Patient is in cervical collar.  She is extremely somnolent.  She does localize pain and will reach if a painful stimulus to her chest or shoulder.  Respirations are nonlabored but slow.  HENT:     Head:     Comments: Patient has older contusion to the right cheek.    Nose: Nose normal.     Mouth/Throat:     Mouth: Mucous membranes are dry.     Pharynx: Oropharynx is clear.  Eyes:     Comments: Pupils are 2 to 3 mm symmetric.  Neck:     Comments: Cervical collar maintained. Cardiovascular:     Rate and Rhythm: Normal rate and regular rhythm.  Pulmonary:     Effort: Pulmonary effort is normal.     Breath sounds: Normal breath sounds.  Abdominal:     General: There is no distension.     Palpations: Abdomen is soft.     Tenderness: There is no abdominal tenderness. There is no guarding.  Musculoskeletal:        General: No swelling.     Right lower leg: No edema.     Left lower leg: No edema.  Skin:    General: Skin is warm and dry.     Coloration: Skin is pale.  Neurological:     Comments: Patient is profoundly somnolent.  She is not answering questions.  With painful stimulus to chest sternal rub or noxious stimulus to shoulders, she will reach left or right hand independently and grimace and acknowledge pain.  However, she does not awaken to converse or follow commands.     (all labs ordered are listed, but only abnormal results are displayed) Labs Reviewed  COMPREHENSIVE METABOLIC PANEL WITH GFR - Abnormal; Notable for the following components:      Result Value   Chloride 97 (*)    Glucose, Bld 131 (*)    BUN 39 (*)    Creatinine, Ser 1.71 (*)    GFR, Estimated 29 (*)    All other components within normal limits  CBC - Abnormal; Notable for the following components:   RBC 2.20 (*)    Hemoglobin 7.8 (*)    HCT 23.8 (*)    MCV 108.2 (*)    MCH 35.5 (*)    RDW 20.3 (*)    All  other components within normal limits  URINALYSIS, ROUTINE W REFLEX MICROSCOPIC - Abnormal; Notable for the following components:   Leukocytes,Ua MODERATE (*)    All other components within normal limits  URINALYSIS, MICROSCOPIC (REFLEX) - Abnormal; Notable for the following components:   Bacteria, UA MANY (*)    All other components within normal limits  CBG MONITORING, ED - Abnormal; Notable for the following components:   Glucose-Capillary 125 (*)    All other components within normal limits  I-STAT VENOUS BLOOD GAS, ED - Abnormal; Notable for the following components:   pCO2, Ven 43.8 (*)    pO2, Ven 30 (*)    Sodium 134 (*)    HCT 21.0 (*)    Hemoglobin 7.1 (*)    All other components within normal limits  TROPONIN T, HIGH SENSITIVITY - Abnormal; Notable for the following components:   Troponin T High Sensitivity 39 (*)    All other components within normal limits  TROPONIN T, HIGH SENSITIVITY - Abnormal; Notable for the following components:   Troponin T High Sensitivity 34 (*)    All other components within normal limits  URINE CULTURE  CULTURE, BLOOD (ROUTINE X 2)  CULTURE, BLOOD (ROUTINE X 2)  AMMONIA  LACTIC ACID, PLASMA  MAGNESIUM   LACTIC ACID, PLASMA  URINE DRUG SCREEN  BLOOD GAS, VENOUS  CBG MONITORING, ED    EKG: EKG Interpretation Date/Time:  Wednesday October 18 2023 20:16:01 EDT Ventricular Rate:  87 PR Interval:  147 QRS Duration:  131 QT Interval:  397 QTC Calculation: 478 R Axis:   -47  Text Interpretation: Sinus rhythm Atrial premature complexes Left buno sig change from previous Confirmed by Armenta Canning [phone]) on 10/18/2023 11:17:37 PM  Radiology: CT Head Wo Contrast Result Date: 10/18/2023 CLINICAL DATA:  Mental status change, unknown cause 83 y/o female with AMS. Pt has had multiple falls with fractures,pain has increased in left side of head since Friday. Family reports increased AMS since med changes last week. EXAM: CT HEAD WITHOUT  CONTRAST CT CERVICAL SPINE WITHOUT CONTRAST TECHNIQUE: Multidetector CT imaging of the head and cervical spine was performed following the standard protocol without intravenous contrast. Multiplanar CT image reconstructions of the cervical spine were also generated. RADIATION DOSE REDUCTION: This exam was performed according to the departmental dose-optimization program which includes automated exposure control, adjustment of the mA and/or kV according to patient size and/or use of iterative reconstruction technique. COMPARISON:  CT head FINDINGS: CT HEAD FINDINGS Brain: Patchy and confluent areas of decreased attenuation are noted throughout the deep and periventricular white matter of the cerebral hemispheres bilaterally, compatible with chronic microvascular ischemic disease. No evidence of large-territorial acute infarction. No parenchymal hemorrhage. No mass lesion. No extra-axial collection. No mass effect or midline shift. No hydrocephalus. Basilar cisterns are patent. Vascular: No hyperdense vessel. Skull: No acute fracture or focal lesion. Sinuses/Orbits: Paranasal sinuses and mastoid air cells are clear. Bilateral lens replacement. Otherwise the orbits are unremarkable. Other: None. CT CERVICAL SPINE FINDINGS Alignment: Stable grade 1 anterolisthesis of C3 on C4 and C4 on C5. Stable mild retrolisthesis of C5 on C6. Stable grade 1 anterolisthesis of C7 on T1. 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 Interval slight increased distraction (3 mm instead of 1 mm) with no definite periosteal reaction. Redemonstration of subacute chronic right C1 transverse foramina fracture. Acute minimally displaced left posterior arch C1 fracture. Improved alignment of a subacute to chronic type 2 dens fracture with 1 mm posterior displacement sepsis (from 3 mm). Multilevel severe degenerative changes of the spine. Associated multilevel severe osseous neural foraminal  stenosis. No aggressive appearing focal osseous lesion or focal pathologic process. Soft tissues and spinal canal: No prevertebral fluid or swelling. No visible canal hematoma. Upper chest: Unremarkable. Other: Atherosclerotic plaque of the carotid arteries within the neck. IMPRESSION: 1. No acute intracranial abnormality. 2. Acute minimally displaced left posterior arch C1 fracture. 3. 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 Correlate with point tenderness to palpation to evaluate for an acute component. 4. Improved alignment of a subacute to chronic type 2 dens fracture with 1 mm posterior displacement sepsis (from 3 mm). Electronically Signed   By: Morgane  Naveau M.D.   On: 10/18/2023 20:56   CT CERVICAL SPINE WO CONTRAST Result Date: 10/18/2023 CLINICAL DATA:  Mental status change, unknown cause 83 y/o female with AMS. Pt has had multiple falls with fractures,pain has increased in left side of head since Friday. Family reports increased AMS since med changes last week. EXAM: CT HEAD WITHOUT CONTRAST CT CERVICAL SPINE WITHOUT CONTRAST TECHNIQUE: Multidetector CT imaging of the head and cervical spine was performed following the standard protocol without intravenous contrast. Multiplanar CT image reconstructions of the cervical spine were also generated. RADIATION DOSE REDUCTION: This exam was performed according to the departmental dose-optimization program which includes automated exposure control, adjustment of the mA and/or kV according to patient size and/or use of iterative reconstruction technique. COMPARISON:  CT head FINDINGS: CT HEAD FINDINGS Brain: Patchy and confluent areas of decreased attenuation are noted throughout the deep and periventricular white matter of the cerebral hemispheres bilaterally, compatible with chronic microvascular ischemic disease. No evidence of large-territorial acute infarction. No  parenchymal hemorrhage. No mass lesion. No extra-axial  collection. No mass effect or midline shift. No hydrocephalus. Basilar cisterns are patent. Vascular: No hyperdense vessel. Skull: No acute fracture or focal lesion. Sinuses/Orbits: Paranasal sinuses and mastoid air cells are clear. Bilateral lens replacement. Otherwise the orbits are unremarkable. Other: None. CT CERVICAL SPINE FINDINGS Alignment: Stable grade 1 anterolisthesis of C3 on C4 and C4 on C5. Stable mild retrolisthesis of C5 on C6. Stable grade 1 anterolisthesis of C7 on T1. 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 Interval slight increased distraction (3 mm instead of 1 mm) with no definite periosteal reaction. Redemonstration of subacute chronic right C1 transverse foramina fracture. Acute minimally displaced left posterior arch C1 fracture. Improved alignment of a subacute to chronic type 2 dens fracture with 1 mm posterior displacement sepsis (from 3 mm). Multilevel severe degenerative changes of the spine. Associated multilevel severe osseous neural foraminal stenosis. No aggressive appearing focal osseous lesion or focal pathologic process. Soft tissues and spinal canal: No prevertebral fluid or swelling. No visible canal hematoma. Upper chest: Unremarkable. Other: Atherosclerotic plaque of the carotid arteries within the neck. IMPRESSION: 1. No acute intracranial abnormality. 2. Acute minimally displaced left posterior arch C1 fracture. 3. 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 Correlate with point tenderness to palpation to evaluate for an acute component. 4. Improved alignment of a subacute to chronic type 2 dens fracture with 1 mm posterior displacement sepsis (from 3 mm). Electronically Signed   By: Morgane  Naveau M.D.   On: 10/18/2023 20:56   DG Chest Port 1 View Result Date: 10/18/2023 CLINICAL DATA:  Hypoxia. Multiple falls with fractures. Increasing altered mental status. EXAM: PORTABLE CHEST 1 VIEW  COMPARISON:  CT chest abdomen and pelvis 09/22/2023 FINDINGS: Shallow inspiration. Heart size and pulmonary vascularity are normal for technique. Infiltration or atelectasis suggested behind the heart in the left base. No pleural effusion or pneumothorax. Calcified and tortuous aorta. Degenerative changes in the spine and right shoulder. Postoperative left shoulder arthroplasty. IMPRESSION: Shallow inspiration. Infiltration or atelectasis in the left lung base behind the heart. Electronically Signed   By: Elsie Gravely M.D.   On: 10/18/2023 20:35     Procedures  CRITICAL CARE Performed by: Ludivina Shines   Total critical care time: 45 minutes  Critical care time was exclusive of separately billable procedures and treating other patients.  Critical care was necessary to treat or prevent imminent or life-threatening deterioration.  Critical care was time spent personally by me on the following activities: development of treatment plan with patient and/or surrogate as well as nursing, discussions with consultants, evaluation of patient's response to treatment, examination of patient, obtaining history from patient or surrogate, ordering and performing treatments and interventions, ordering and review of laboratory studies, ordering and review of radiographic studies, pulse oximetry and re-evaluation of patient's condition.  Medications Ordered in the ED  levofloxacin  (LEVAQUIN ) IVPB 500 mg (has no administration in time range)  lactated ringers  infusion (has no administration in time range)  lactated ringers  bolus 1,000 mL (0 mLs Intravenous Stopped 10/18/23 2214)  naloxone  (NARCAN ) injection 0.4 mg (0.4 mg Intravenous Given 10/18/23 1935)  naloxone  (NARCAN ) injection 1 mg (1 mg Intravenous Given 10/18/23 2003)                                    Medical Decision Making Amount and/or Complexity of  Data Reviewed Labs: ordered. Radiology: ordered.  Risk Prescription drug  management. Decision regarding hospitalization.  Patient presents as outlined.  She has had gradual altered mental status over the past several days.  Patient had significant injury and is wearing a cervical collar for C1-C2 fracture.  Per family patient has not had fever or chills leading up to this event.  They do not believe she has had additional falls since the original injury.  At this time with significantly altered mental status differential diagnosis includes occult subdural hematoma\metabolic encephalopathy\overmedication with recent medication changes.  Will proceed with broad diagnostic evaluation.  The patient did have some hypoxia on arrival.  Oxygen saturations were in the mid 80s on room air.  Patient was placed on nasal cannula oxygen.  Narcan  administered 0.4 mg followed by 1 mg.  Minimal change.  Patient's respiratory rate did seem to increase but mental status did not change.  CBG 125.  Metabolic panel GFR 29 glucose 868 LFTs normal potassium 3.8 white count 8.2 hemoglobin 7.8 platelets 181 troponin 39 magnesium  2.2 urinalysis greater than 50 WBCs moderate leuk esterase.  Will opt to treat for UTI given patient's mental status change.  Will also add urine culture.  Somewhat lower suspicion that degree of mental status change is due to UTI.  Patient does not have lactic acidosis, leukocytosis or fever.  Insetting of altered mental status however will opt to treat.  CT head no acute changes from previous.  Interpreted by radiology.  CT cervical spine previous fracture areas noted.  Note made of element of minimally displaced acute C1 left posterior arch fracture.  Venous gas 7.3 pCO2 43 ammonia less than 13  At this time, no obvious metabolic or traumatic cause for patient's mental status change.  At this time I have highest suspicion for overmedication most likely with gabapentin .  The patient is breathing spontaneously and oxygen has been titrated down to 1 L.  She localizes pain and  will independently use left and right upper extremities to purposefully reach for pain source.  Heart rate and blood pressures are stable.  At this time patient will require admission for continued monitoring for encephalopathy until patient's mental status improves or other etiology identified.  Consult: Dr. Alfornia for admission.     Final diagnoses:  Altered mental status, unspecified altered mental status type  Encephalopathy, unspecified type    ED Discharge Orders     None          Armenta Canning, MD 10/18/23 2328

## 2023-10-18 NOTE — ED Notes (Signed)
 Pt arrives to rm 11 via WC from triage. Pt presents lying on cart, Ccollar misplaced, improperly on. Ccollar replaced with appropriate size. Pt difficult to arouse, RR labored, pinpoint pupils. Pt family states pt has not been acting normal since Monday, but worse today with mentation. Pt family reports new medication prescribed to pt including increase dose of gabapentin  and new pain medication and depression med. Pt placed on heart monitor. EDMD at bedside for eval. PIV inserted and blood drawn, sent to lab. See new orders.

## 2023-10-18 NOTE — ED Notes (Signed)
 ED Provider at bedside for eval d/t AMS, difficult to arouse pt on RN assessment. See new orders.

## 2023-10-19 ENCOUNTER — Encounter (HOSPITAL_COMMUNITY): Payer: Self-pay | Admitting: Internal Medicine

## 2023-10-19 DIAGNOSIS — Z743 Need for continuous supervision: Secondary | ICD-10-CM | POA: Diagnosis not present

## 2023-10-19 DIAGNOSIS — D649 Anemia, unspecified: Secondary | ICD-10-CM

## 2023-10-19 DIAGNOSIS — R4182 Altered mental status, unspecified: Secondary | ICD-10-CM | POA: Diagnosis present

## 2023-10-19 DIAGNOSIS — G9340 Encephalopathy, unspecified: Secondary | ICD-10-CM | POA: Diagnosis present

## 2023-10-19 DIAGNOSIS — R410 Disorientation, unspecified: Secondary | ICD-10-CM | POA: Diagnosis not present

## 2023-10-19 DIAGNOSIS — I959 Hypotension, unspecified: Secondary | ICD-10-CM | POA: Diagnosis not present

## 2023-10-19 LAB — CBC WITH DIFFERENTIAL/PLATELET
Abs Immature Granulocytes: 0.04 K/uL (ref 0.00–0.07)
Basophils Absolute: 0.1 K/uL (ref 0.0–0.1)
Basophils Relative: 1 %
Eosinophils Absolute: 0.2 K/uL (ref 0.0–0.5)
Eosinophils Relative: 4 %
HCT: 22.5 % — ABNORMAL LOW (ref 36.0–46.0)
Hemoglobin: 7.4 g/dL — ABNORMAL LOW (ref 12.0–15.0)
Immature Granulocytes: 1 %
Lymphocytes Relative: 20 %
Lymphs Abs: 1.3 K/uL (ref 0.7–4.0)
MCH: 35.7 pg — ABNORMAL HIGH (ref 26.0–34.0)
MCHC: 32.9 g/dL (ref 30.0–36.0)
MCV: 108.7 fL — ABNORMAL HIGH (ref 80.0–100.0)
Monocytes Absolute: 0.9 K/uL (ref 0.1–1.0)
Monocytes Relative: 14 %
Neutro Abs: 3.9 K/uL (ref 1.7–7.7)
Neutrophils Relative %: 60 %
Platelets: 166 K/uL (ref 150–400)
RBC: 2.07 MIL/uL — ABNORMAL LOW (ref 3.87–5.11)
RDW: 20.2 % — ABNORMAL HIGH (ref 11.5–15.5)
WBC: 6.3 K/uL (ref 4.0–10.5)
nRBC: 0 % (ref 0.0–0.2)

## 2023-10-19 LAB — BASIC METABOLIC PANEL WITH GFR
Anion gap: 12 (ref 5–15)
BUN: 30 mg/dL — ABNORMAL HIGH (ref 8–23)
CO2: 25 mmol/L (ref 22–32)
Calcium: 8.9 mg/dL (ref 8.9–10.3)
Chloride: 100 mmol/L (ref 98–111)
Creatinine, Ser: 1.25 mg/dL — ABNORMAL HIGH (ref 0.44–1.00)
GFR, Estimated: 43 mL/min — ABNORMAL LOW (ref >60)
Glucose, Bld: 103 mg/dL — ABNORMAL HIGH (ref 70–99)
Potassium: 3.4 mmol/L — ABNORMAL LOW (ref 3.5–5.1)
Sodium: 137 mmol/L (ref 135–145)

## 2023-10-19 LAB — LACTIC ACID, PLASMA: Lactic Acid, Venous: 1 mmol/L (ref 0.5–1.9)

## 2023-10-19 LAB — HEPATIC FUNCTION PANEL
ALT: 8 U/L (ref 0–44)
AST: 18 U/L (ref 15–41)
Albumin: 2.6 g/dL — ABNORMAL LOW (ref 3.5–5.0)
Alkaline Phosphatase: 32 U/L — ABNORMAL LOW (ref 38–126)
Bilirubin, Direct: 0.2 mg/dL (ref 0.0–0.2)
Indirect Bilirubin: 0.5 mg/dL (ref 0.3–0.9)
Total Bilirubin: 0.7 mg/dL (ref 0.0–1.2)
Total Protein: 6.2 g/dL — ABNORMAL LOW (ref 6.5–8.1)

## 2023-10-19 LAB — TSH: TSH: 3.02 u[IU]/mL (ref 0.350–4.500)

## 2023-10-19 MED ORDER — MEDIHONEY WOUND/BURN DRESSING EX PSTE
1.0000 | PASTE | Freq: Every day | CUTANEOUS | Status: DC
  Administered 2023-10-19 – 2023-10-22 (×4): 1 via TOPICAL
  Filled 2023-10-19: qty 44

## 2023-10-19 MED ORDER — MAGNESIUM OXIDE -MG SUPPLEMENT 400 (240 MG) MG PO TABS
200.0000 mg | ORAL_TABLET | Freq: Every day | ORAL | Status: DC
  Administered 2023-10-19 – 2023-10-22 (×4): 200 mg via ORAL
  Filled 2023-10-19 (×4): qty 1

## 2023-10-19 MED ORDER — ACETAMINOPHEN 325 MG PO TABS
650.0000 mg | ORAL_TABLET | ORAL | Status: DC | PRN
  Administered 2023-10-19 – 2023-10-22 (×5): 650 mg via ORAL
  Filled 2023-10-19 (×5): qty 2

## 2023-10-19 MED ORDER — ATORVASTATIN CALCIUM 10 MG PO TABS
20.0000 mg | ORAL_TABLET | Freq: Every day | ORAL | Status: DC
  Administered 2023-10-19 – 2023-10-21 (×3): 20 mg via ORAL
  Filled 2023-10-19 (×3): qty 2

## 2023-10-19 MED ORDER — TAMOXIFEN CITRATE 10 MG PO TABS
20.0000 mg | ORAL_TABLET | Freq: Every day | ORAL | Status: DC
  Administered 2023-10-19 – 2023-10-22 (×4): 20 mg via ORAL
  Filled 2023-10-19 (×4): qty 2

## 2023-10-19 MED ORDER — HEPARIN SODIUM (PORCINE) 5000 UNIT/ML IJ SOLN
5000.0000 [IU] | Freq: Two times a day (BID) | INTRAMUSCULAR | Status: DC
  Administered 2023-10-19 – 2023-10-21 (×5): 5000 [IU] via SUBCUTANEOUS
  Filled 2023-10-19 (×5): qty 1

## 2023-10-19 MED ORDER — PANTOPRAZOLE SODIUM 40 MG PO TBEC
40.0000 mg | DELAYED_RELEASE_TABLET | Freq: Every day | ORAL | Status: DC
  Administered 2023-10-19 – 2023-10-22 (×4): 40 mg via ORAL
  Filled 2023-10-19 (×4): qty 1

## 2023-10-19 MED ORDER — ORAL CARE MOUTH RINSE: 15.0000 mL | OROMUCOSAL | Status: DC | PRN

## 2023-10-19 MED ORDER — LEVOFLOXACIN IN D5W 500 MG/100ML IV SOLN: 500.0000 mg | INTRAVENOUS | Status: DC

## 2023-10-19 MED ORDER — CEFADROXIL 500 MG PO CAPS
500.0000 mg | ORAL_CAPSULE | Freq: Every day | ORAL | Status: DC
  Administered 2023-10-20 – 2023-10-22 (×3): 500 mg via ORAL
  Filled 2023-10-19 (×3): qty 1

## 2023-10-19 MED ORDER — FINERENONE 10 MG PO TABS: 10.0000 mg | ORAL_TABLET | Freq: Every morning | ORAL | Status: DC

## 2023-10-19 MED ORDER — RENA-VITE PO TABS
1.0000 | ORAL_TABLET | Freq: Every day | ORAL | Status: DC
  Administered 2023-10-19 – 2023-10-22 (×4): 1 via ORAL
  Filled 2023-10-19 (×4): qty 1

## 2023-10-19 MED ORDER — HEPARIN SODIUM (PORCINE) 5000 UNIT/ML IJ SOLN: 5000.0000 [IU] | Freq: Three times a day (TID) | INTRAMUSCULAR | Status: DC

## 2023-10-19 MED ORDER — HYDRALAZINE HCL 20 MG/ML IJ SOLN: 5.0000 mg | INTRAMUSCULAR | Status: DC | PRN

## 2023-10-19 MED ORDER — NIFEDIPINE ER OSMOTIC RELEASE 60 MG PO TB24: 90.0000 mg | ORAL_TABLET | Freq: Every day | ORAL | Status: DC

## 2023-10-19 MED ORDER — OXYCODONE HCL 5 MG PO TABS
2.5000 mg | ORAL_TABLET | ORAL | Status: DC | PRN
  Administered 2023-10-19 – 2023-10-21 (×3): 2.5 mg via ORAL
  Filled 2023-10-19 (×3): qty 1

## 2023-10-19 NOTE — ED Notes (Signed)
 Called and informed [facility] in route with pt. And for RN to sign handoff

## 2023-10-19 NOTE — Consult Note (Signed)
 WOC Nurse Consult Note: Reason for Consult: buttocks/sacral wounds  Wound type: 1.  L buttock Stage 2 Pressure Injury red dry  2.  R buttock Stage 3 Pressure Injury 80% red 20% yellow  3.  Sacrum with 2 separate Unstageable Pressure Injuries 100% tan gray necrotic tissue  Pressure Injury POA: Yes Measurement: see nursing flowsheet  Wound bed: as above  Drainage (amount, consistency, odor) see nursing flowsheet  Periwound: erythema  Dressing procedure/placement/frequency: Cleanse B buttock and sacral wounds with Vashe wound cleanser Soila [phone]), do not rinse and allow to air dry Apply Medihoney to all wounds daily, cover with dry gauze and secure with silicone foam or ABD pad and tape whichever is preferred.   POC discussed with bedside nurse.  Patient would benefit from a low air loss mattress  for pressure redistribution and moisture management.   WOC team will not follow. Re-consult if further needs arise.   Thank you,    Powell Bar MSN, RN-BC, [employer]

## 2023-10-19 NOTE — Progress Notes (Signed)
 Patient on skin check has multiple discolorations on skin on legs and arms, ecchymosis on legs right of face and legs, and scabbed of abrasion on arms and legs, wound care consult placed for areas to sacrum and buttocks

## 2023-10-19 NOTE — TOC Initial Note (Incomplete)
 Transition of Care [facility]) - Initial/Assessment Note    Patient Details  Name: Heidi Lutz MRN: [id number] Date of Birth: [birth year]  [facility] CM/SW Contact:    Andrez JULIANNA George, RN Phone Number: 10/19/2023, 1:54 PM  Clinical Narrative:                  Pt is from [place] with her spouse. She fell and has neck brace on.  Pt hoping for return to their apartment with [facility] therapy. Pt was not able to get out of the bed today. Will need to move around better with therapy before returning to her apartment.  Family will transport home if she doesn't need SNF.   Expected Discharge Plan: Home w Home Health Services Barriers to Discharge: Continued Medical Work up   Patient Goals and CMS Choice   CMS Medicare.gov Compare Post Acute Care list provided to:: Patient Choice offered to / list presented to : Patient      Expected Discharge Plan and Services   Discharge Planning Services: CM Consult Post Acute Care Choice: Home Health Living arrangements for the past 2 months: Apartment                                      Prior Living Arrangements/Services Living arrangements for the past 2 months: Apartment Lives with:: Spouse Patient language and need for interpreter reviewed:: Yes Do you feel safe going back to the place where you live?: Yes        Care giver support system in place?: Yes (comment) Current home services: DME (rollator/ shower seat) Criminal Activity/Legal Involvement Pertinent to Current Situation/Hospitalization: No - Comment as needed  Activities of Daily Living   ADL Screening (condition at time of admission) Independently performs ADLs?: Yes (appropriate for developmental age) Is the patient deaf or have difficulty hearing?: No Does the patient have difficulty seeing, even when wearing glasses/contacts?: No Does the patient have difficulty concentrating, remembering, or making decisions?: No  Permission Sought/Granted                   Emotional Assessment Appearance:: Appears stated age Attitude/Demeanor/Rapport: Engaged Affect (typically observed): Accepting Orientation: : Oriented to Self, Oriented to Place, Oriented to  Time, Oriented to Situation   Psych Involvement: No (comment)  Admission diagnosis:  Acute encephalopathy [G93.40] Altered mental status, unspecified altered mental status type [R41.82] Encephalopathy, unspecified type [G93.40] Patient Active Problem List   Diagnosis Date Noted   Acute encephalopathy 10/18/2023   Symptomatic anemia 09/22/2023   Aortic atherosclerosis (HCC) 08/16/2022   Cervical spondylosis 07/13/2022   History of UTI 05/25/2022   Urge incontinence 05/25/2022   Rheumatoid arthritis (HCC)    Raynaud disease    PONV (postoperative nausea and vomiting)    Personal history of radiation therapy    Osteoarthritis    High cholesterol    Environmental allergies    Complication of anesthesia    OAB (overactive bladder) 02/2020   Age-related osteoporosis without current pathological fracture 10/31/2019   History of vertebral compression fracture 07/16/2019   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   Pleuritic chest pain 04/10/2018   GAD (generalized anxiety disorder) 04/25/2017   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   LBBB (left bundle branch block) 03/22/2017   Pre-operative cardiovascular examination 03/22/2017   Mixed dyslipidemia 03/22/2017   Mixed hyperlipidemia 03/22/2017   Malignant neoplasm of central portion of  left breast in female, estrogen receptor positive (HCC) 03/16/2017   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/2017   Osteopenia 03/03/2017   Cancer (HCC) 01/2017   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   Lumbar degenerative disc disease 02/27/2015   Lumbar spinal stenosis 02/27/2015   Osteoarthritis of spine with radiculopathy, lumbar region 02/27/2015   Basal cell carcinoma 12/15/2014   Closed  compression fracture of L1 lumbar vertebra, with routine healing, subsequent encounter 11/26/2013   Insomnia 02/02/2010   Hyperlipidemia 04/22/2009   Essential hypertension 09/19/2008   Allergic rhinitis 07/31/2007   IBS (irritable bowel syndrome) 08/18/2006   Raynaud's disease 08/18/2006   PCP:  Burney Darice CROME, MD Pharmacy:   [id number] - [place], [place] - [address] [place] [address] Phone: [phone] Fax: [phone]     Social Drivers of Health (SDOH) Social History: SDOH Screenings   Food Insecurity: No Food Insecurity (10/19/2023)  Housing: Low Risk  (10/19/2023)  Transportation Needs: No Transportation Needs (10/19/2023)  Utilities: Not At Risk (10/19/2023)  Depression (PHQ2-9): Low Risk  (03/20/2020)  Financial Resource Strain: Low Risk  (05/03/2021)   Received from [facility]  Physical Activity: Insufficiently Active (05/03/2021)   Received from [facility]  Social Connections: Unknown (10/19/2023)  Stress: No Stress Concern Present (05/03/2021)   Received from [facility]  Tobacco Use: Medium Risk (10/19/2023)   SDOH Interventions:     Readmission Risk Interventions     No data to display

## 2023-10-19 NOTE — Evaluation (Signed)
 Physical Therapy Evaluation Patient Details Name: Heidi Lutz MRN: [id number] DOB: May 27, 1940 Today's Date: 10/19/2023  History of Present Illness  Patient is a 83 year old female with acute encephalopathy suspected medication related, possible UTI. Known recent C1-C2 fracture with hard collar recommended. PMH: HTN, HLD, breast cancer, symptomatic anemia.  Clinical Impression  Patient is agreeable to PT evaluation. She reports limited mobility at home with supervision/assistance from family since recent discharge.  Today the patient reports no pain at rest. Significant increase in pain reported with sitting on edge of bed. She declined standing but was able to perform bed mobility and a lateral scoot with CGA. The family is hopeful for return home with home health PT. Recommend to continue PT to maximize independence and decrease caregiver burden.       If plan is discharge home, recommend the following: A little help with walking and/or transfers;A little help with bathing/dressing/bathroom;Assist for transportation;Help with stairs or ramp for entrance;Assistance with cooking/housework   Can travel by private vehicle        Equipment Recommendations None recommended by PT  Recommendations for Other Services       Functional Status Assessment Patient has had a recent decline in their functional status and demonstrates the ability to make significant improvements in function in a reasonable and predictable amount of time.     Precautions / Restrictions Precautions Precautions: Fall;Cervical Precaution Booklet Issued: No Recall of Precautions/Restrictions: Impaired Required Braces or Orthoses: Cervical Brace Restrictions Weight Bearing Restrictions Per Provider Order: No      Mobility  Bed Mobility Overal bed mobility: Needs Assistance Bed Mobility: Rolling, Sidelying to Sit, Sit to Sidelying Rolling: Contact guard assist Sidelying to sit: Contact guard assist      Sit to sidelying: Contact guard assist General bed mobility comments: logroll technique encouraged. increased time and effort    Transfers Overall transfer level: Needs assistance   Transfers: Bed to chair/wheelchair/BSC            Lateral/Scoot Transfers: Contact guard assist General transfer comment: patient is able to compete one lateral scoot with CGA. she declined standing due to increased pain with mobility. sitting tolerance of less than one minute    Ambulation/Gait               General Gait Details: not tested. patient declined standing today  Stairs            Wheelchair Mobility     Tilt Bed    Modified Rankin (Stroke Patients Only)       Balance Overall balance assessment: Needs assistance Sitting-balance support: Feet supported, No upper extremity supported Sitting balance-Leahy Scale: Fair Sitting balance - Comments: no loss of balance while sitting                                     Pertinent Vitals/Pain Pain Assessment Pain Assessment: 0-10 Pain Score: 10-Worst pain ever Pain Location: posterior neck radiating to front of head on the right side (only with movement). Pain Descriptors / Indicators: Discomfort, Grimacing Pain Intervention(s): Limited activity within patient's tolerance, Monitored during session, Repositioned    Home Living Family/patient expects to be discharged to:: Private residence (independent living facility) Living Arrangements: Spouse/significant other Available Help at Discharge: Family;Available 24 hours/day Type of Home: Apartment         Home Layout: One level Home Equipment: Rollator (4 wheels);Shower seat;Grab bars - toilet;Grab  bars - tub/shower;Wheelchair - manual      Prior Function Prior Level of Function : Independent/Modified Independent;History of Falls (last six months)             Mobility Comments: Mod I with rollator prior to recent neck fracture. Family has been  providing supervision/assistance for home ambulation since injury ADLs Comments: recently assistance required from family     Extremity/Trunk Assessment   Upper Extremity Assessment Upper Extremity Assessment: Generalized weakness    Lower Extremity Assessment Lower Extremity Assessment: Generalized weakness    Cervical / Trunk Assessment Cervical / Trunk Assessment:  (hard collar neck)  Communication   Communication Communication: No apparent difficulties    Cognition Arousal: Alert Behavior During Therapy: Flat affect   PT - Cognitive impairments: Initiation, Memory                       PT - Cognition Comments: impaired short term memory. cues for initiation of activity Following commands: Impaired Following commands impaired: Follows one step commands with increased time     Cueing Cueing Techniques: Verbal cues     General Comments General comments (skin integrity, edema, etc.): Sp02 in the high 90's on room air. Heart rate up to 140 with mobility with pain reported, decreasing to 118 with return to bed. patient requesting to be left on room air (was on 1 L02 on arrival to the room)    Exercises     Assessment/Plan    PT Assessment Patient needs continued PT services  PT Problem List Decreased strength;Decreased range of motion;Decreased activity tolerance;Decreased balance;Decreased mobility;Decreased cognition;Decreased safety awareness;Pain       PT Treatment Interventions DME instruction;Gait training;Stair training;Functional mobility training;Therapeutic exercise;Balance training;Therapeutic activities;Cognitive remediation;Neuromuscular re-education;Patient/family education    PT Goals (Current goals can be found in the Care Plan section)  Acute Rehab PT Goals Patient Stated Goal: to return home with home health PT PT Goal Formulation: With patient/family Time For Goal Achievement: 11/02/23 Potential to Achieve Goals: Fair    Frequency Min  2X/week     Co-evaluation               AM-PAC PT 6 Clicks Mobility  Outcome Measure Help needed turning from your back to your side while in a flat bed without using bedrails?: None Help needed moving from lying on your back to sitting on the side of a flat bed without using bedrails?: A Little Help needed moving to and from a bed to a chair (including a wheelchair)?: A Little Help needed standing up from a chair using your arms (e.g., wheelchair or bedside chair)?: A Little Help needed to walk in hospital room?: A Little Help needed climbing 3-5 steps with a railing? : A Lot 6 Click Score: 18    End of Session Equipment Utilized During Treatment: Cervical collar Activity Tolerance: Patient limited by pain Patient left: in bed;with call bell/phone within reach;with bed alarm set;with SCD's reapplied;with family/visitor present Nurse Communication: Mobility status PT Visit Diagnosis: Muscle weakness (generalized) (M62.81);Unsteadiness on feet (R26.81)    Time: 1040-1100 PT Time Calculation (min) (ACUTE ONLY): 20 min   Charges:   PT Evaluation $PT Eval Moderate Complexity: 1 Mod   PT General Charges $$ ACUTE PT VISIT: 1 Visit       Randine Essex, PT, MPT   Randine LULLA Essex 10/19/2023, 11:35 AM

## 2023-10-19 NOTE — Progress Notes (Addendum)
  PROGRESS NOTE    Heidi Lutz  FMW:[id number] DOB: May 12, 1940 DOA: 10/18/2023 PCP: Burney Darice CROME, MD   Same Day Admission  Patient was seen and examined at the bedside. She has a cervical collar in place. She appears drowsy but able to respond to questions. She lives at home with her husband. She doesn't fully remember how she ended in the hospital. She did tell me that she has an appointment with Dr Mavis next week. She was admitted with confusion, likely in the setting of polypharmacy (remeron, lyrica  and opioids). CT head is unremarkable. CT C-spine last night showed acute minimally displaced left posterior arch C1 fracture and subacute to chronic right anterior arch C1 fracture. CT C-spine done on 09/20/23 showed acute anterior C1 ring and type 2 odontoid fracture.  Spoke to Dr Mavis on the phone at 2 pm on 8/28 and he will talk to the on call neurosurgeon. Dr Mavis is currently out of town. It doesn't seem like she needs inpatient neurosurgery but will see what the on call neurosurgeon says.    LOS: 0 days     [name], MD [facility]  If 7PM-7AM, please contact night-coverage  10/19/2023, 11:47 AM

## 2023-10-19 NOTE — H&P (Addendum)
 History and Physical    Heidi Lutz FMW:[id number] DOB: [birth year] DOA: 10/18/2023  Patient coming from: Home.  Chief Complaint: Confusion.  HPI: Heidi Lutz is a 83 y.o. female with history of hypertension hyperlipidemia, breast cancer on tamoxifen , recently admitted for symptomatic anemia plan to have EGD as outpatient also was diagnosed with C1-C2 fracture has follow-up with Dr. Mavis neurosurgeon on cervical collar was found to be increasingly confused over the last couple of days and was brought to the ER.  Per patient's family patient has not had any new falls but did have falls during last [place] while [place] the beach and sustained a C1-C2 fracture and was placed on cervical collar.  Patient had followed up with her PCP on Monday 4 days ago when patient's Lyrica  dose was increased from 50 mg twice daily to 100 mg in the morning and 150 mg afternoon and night and was placed on Remeron 15 mg for appetite.  Patient also has been taking some hydrocodone  for pain of the neck.  Patient took this dose for 1 day following which the next day she became acutely confused and family went back to original dose of Lyrica  which she has been taking for many months now.  Stopped her Remeron.  Still remained confused was brought to the ER.  ED Course: In the ER patient is found to be somnolent UA is concerning for UTI.  Creatinine mildly increased from baseline.  VBG showing pH of 7.38 pCO2 of 43.8.  CT head did not show anything acute.  CT C-spine shows acute minimally displaced left posterior C1 fracture and subacute to chronic nonunited right anterior arch C1 fracture and improved alignment of subacute to chronic type II dens fracture.  Patient was placed on Levaquin  IV fluids and admitted for further management of acute encephalopathy suspect likely a combination of medications and UTI.  At the time of my exam patient is oriented to time place and person moving all extremities.  Family feels  patient is at this time back to baseline.    Review of Systems: As per HPI, rest all negative.   Past Medical History:  Diagnosis Date   Age-related osteoporosis without current pathological fracture 10/31/2019   Allergic rhinitis 07/31/2007   Overview:   S/p allergy shots, 20 years ago Dr. Mariea     10/1 IMO update   Aortic atherosclerosis (HCC) 08/16/2022   Basal cell carcinoma 12/15/2014   Overview:   Managed by Dr. Tricia, derm   Cancer [facility]) 01/2017   left breast cancer, basal cell and 1 melanoma   Cervical spondylosis 07/13/2022   Closed compression fracture of L1 lumbar vertebra, with routine healing, subsequent encounter 11/26/2013   Overview:   Normal DEXA 2015     Last Assessment & Plan:   New order for DEXA written today. Patient is getting epidural steroid injections with pain management  Overview:   Overview:   Normal DEXA 2015     Last Assessment & Plan:   continued improvement in pain, will refill tramadol  and robaxin today.  Will return to PCP for follow-up   Complication of anesthesia    heart rate spikeed in the PACU on her 2nd knee surgery   Environmental allergies    Essential hypertension 09/19/2008   Last Assessment & Plan:   Blood pressure is elevated today. I reviewed her last few pain management notes her blood pressure was normal. Patient is asymptomatic and I suspect today's blood pressure is an outlier. She  will continue home monitoring at her offices and will return for further evaluation if remains elevated   GAD (generalized anxiety disorder) 04/25/2017   Last Assessment & Plan:   See a/p above   High cholesterol    History of UTI 05/25/2022   History of vertebral compression fracture 07/16/2019   Hyperlipidemia 04/22/2009   Last Assessment & Plan:   Compliant with statin, continue   IBS (irritable bowel syndrome) 08/18/2006   Overview:   with IBS        Last Assessment & Plan:   Refill Bentyl for when necessary use. No evidence of infectious or  inflammatory, or malignancy symptoms. Advised to follow-up for further evaluation if more frequent or new symptoms   Insomnia 02/02/2010   Last Assessment & Plan:   Refilled Ambien quantity #30 for the next calendar year. She denies excessive sedation and is aware to not drive when taking medication.   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  Will start Lexapro today.  We discussed interim use of benzodiazepines for pre-procedural or severe episodes.  She reports she has had panic attacks in the past but has not had them recently.  She never took diazepam that was previously prescribed for a procedure.  We discussed   Malignant neoplasm of central portion of left breast in female, estrogen receptor positive (HCC) 03/16/2017   Mixed dyslipidemia 03/22/2017   Mixed hyperlipidemia 03/22/2017   OAB (overactive bladder) 02/2020   Osteoarthritis    Osteoarthritis of spine with radiculopathy, lumbar region 02/27/2015   Osteopenia 03/03/2017   Overview:   DEXA 02/2017   Personal history of radiation therapy    Pleuritic chest pain 04/10/2018   PONV (postoperative nausea and vomiting)    with 1st knee surgery had n/v, none since   Pre-operative cardiovascular examination 03/22/2017   Raynaud disease    Raynauds disease-takes procardia    Raynaud's disease 08/18/2006   Rheumatoid arthritis (HCC)    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 Signa -- [place], [place]   Urge incontinence 05/25/2022    Past Surgical History:  Procedure Laterality Date   ABDOMINAL HYSTERECTOMY  1972   APPENDECTOMY  1953   BREAST BIOPSY Left 05/19/2020   benign   BREAST LUMPECTOMY Left 2019   BREAST LUMPECTOMY WITH RADIOACTIVE SEED AND SENTINEL LYMPH NODE BIOPSY Left 04/07/2017   Procedure: BREAST LUMPECTOMY WITH RADIOACTIVE SEED AND SENTINEL LYMPH NODE BIOPSY;  Surgeon: Vanderbilt Ned, MD;  Location: [facility] OR;  Service: General;  Laterality: Left;   BUNIONECTOMY  1999   COLONOSCOPY     EYE SURGERY     bil cataract   GANGLION CYST EXCISION  1968   JOINT REPLACEMENT Left 2017  reverse shoulder replacement   REPLACEMENT TOTAL KNEE  2012, 2013   Right and Left   SHOULDER ARTHROSCOPY Left    SYMPATHECTOMY  1970   TONSILLECTOMY  1949     reports that she quit smoking about 60 years ago. Her smoking use included cigarettes. She has been exposed to tobacco smoke. She has never used smokeless tobacco. She reports current alcohol use of about 14.0 standard drinks of alcohol per week. She reports that she does not use drugs.  Allergies  Allergen Reactions   Fluorouracil Hives   Other Hives, Itching, Swelling, Rash and Other (See Comments)    NO -CILLIN(s)   Clavulanic Acid Other (See Comments)    Clavulanic acid is a medication that can be used in conjunction with amoxicillin to manage and treat bacterial infections, specifically bacteria that are beta-lactamase producers. It is in the beta-lactamase inhibitor class of medications. Reaction undefined, but is allergic if it is paired with any -cillin(s)   Codeine Nausea And Vomiting   Hydrocodone  Nausea And Vomiting   Penicillins Hives, Swelling,  Rash and Other (See Comments)    Pt states allergic to all cillin drugs. Joint pain. Has patient had a PCN reaction causing immediate rash, facial/tongue/throat swelling, SOB or lightheadedness with hypotension: Yes Has patient had a PCN reaction causing severe rash involving mucus membranes or skin necrosis: No Has patient had a PCN reaction that required hospitalization: No Has patient had a PCN reaction occurring within the last 10 years: No If all of the above answers are NO, then may proceed with Cephalosporin use.     Family History  Adopted: Yes  Problem Relation Age of Onset   Breast cancer Neg Hx     Prior to Admission medications   Medication Sig Start Date End Date Taking? Authorizing Provider  albuterol  (VENTOLIN  HFA) 108 (90 Base) MCG/ACT inhaler Inhale 2 puffs into the lungs every 6 (six) hours as needed for wheezing or shortness of breath.    [provider]  atorvastatin  (LIPITOR) 20 MG tablet Take 20 mg by mouth at bedtime.    [provider]  benzonatate  (TESSALON ) 200 MG capsule Take 200 mg by mouth 3 (three) times daily as needed for cough.    [provider]  Black Cohosh 540 MG CAPS Take 540 mg by mouth daily.     [provider]  BREZTRI  AEROSPHERE 160-9-4.8 MCG/ACT AERO inhaler Inhale 2 puffs into the lungs 2 (two) times daily. 05/15/23   [provider]  Calcium  Carb-Cholecalciferol (CALCIUM  + D3 PO) Take 1 tablet by mouth 2 (two) times daily.    [provider]  calcium  gluconate 500 MG tablet Take 1 tablet (500 mg total) by mouth 2 (two) times daily. Patient not taking: Reported on 09/22/2023 04/19/21   Gudena, Vinay, MD  cetirizine (ZYRTEC) 10 MG tablet Take 10 mg by mouth daily.    [provider]  clobetasol cream (TEMOVATE) 0.05 % Apply 1 Application topically See admin instructions. Apply to affected areas 2 times a day    [provider]  CRANBERRY PO Take 1 capsule by mouth daily.      [provider]  denosumab (PROLIA) 60 MG/ML SOSY injection Inject 60 mg into the skin every 6 (six) months.    [provider]  Diclofenac Sodium 3 % GEL Apply 2 g topically 4 (four) times daily as needed (for pain- affected sites).    [provider]  estradiol  (ESTRACE ) 0.1 MG/GM vaginal cream  Place 1 Applicatorful vaginally at bedtime. 06/26/22   [provider]  famotidine  (PEPCID ) 40 MG tablet Take 40 mg by mouth at bedtime. 04/17/23   [provider]  fosfomycin (MONUROL ) 3 g PACK Take 1 packet by mouth every 10 days Patient taking differently: Take 3 g by mouth See admin instructions. Take 3 grams (1 packet) by mouth every 10 days 05/10/23   Stoneking, Adine PARAS., MD  Glucosamine-Chondroit-Vit C-Mn (GLUCOSAMINE-CHONDROITIN MAX ST) CAPS Take 1 capsule by mouth daily.     [provider]  HYDROcodone -acetaminophen  (NORCO/VICODIN) 5-325 MG tablet Take 1 tablet by mouth See admin instructions. Take 1 tablet by mouth twice a day, alternating with 1,300 mg (of) Tylenol     [provider]  KERENDIA  10 MG TABS Take 10 mg by mouth in the morning.    [provider]  lidocaine  (LIDODERM ) 5 % APPLY 2 PATCHES TO SKIN EVERY DAY, REMOVE AND REPLACE PATCH AFTER 12 HRS 08/13/20   Raulkar, Sven SQUIBB, MD  Magnesium  200 MG TABS Take 200 mg by mouth daily.     [provider]  multivitamin (RENA-VIT) TABS tablet Take 1 tablet by mouth daily. 05/01/23   [provider]  NIFEdipine  (PROCARDIA  XL/ADALAT -CC) 90 MG 24 hr tablet Take 90 mg by mouth daily.     [provider]  pregabalin  (LYRICA ) 50 MG capsule Take 50 mg by mouth 3 (three) times daily.    [provider]  Probiotic Product (PROBIOTIC ADVANCED PO) Take 1 capsule by mouth daily.     [provider]  solifenacin  (VESICARE ) 10 MG tablet Take 1 tablet (10 mg total) by mouth every evening. 05/10/23   Stoneking, Adine PARAS., MD  tamoxifen  (NOLVADEX )  20 MG tablet TAKE 1 TABLET EVERY DAY Patient taking differently: Take 20 mg by mouth in the morning. 08/18/23   Gudena, Vinay, MD  traMADol  (ULTRAM ) 50 MG tablet Take 1 tablet (50 mg total) by mouth every 6 (six) hours as needed. Patient taking differently: Take 50 mg by mouth every 6 (six) hours as needed (for pain). 09/20/23   Francis Ileana SAILOR, PA-C  TYLENOL  8 HOUR ARTHRITIS PAIN 650 MG CR tablet Take 1,300 mg by mouth See admin instructions. Take 1,300 mg by mouth twice a day, alternating with Norco 5-325    [provider]    Physical Exam: Constitutional: Moderately built and nourished. Vitals:   10/18/23 2321 10/19/23 0105 10/19/23 0130 10/19/23 0258  BP:   (!) 109/45 (!) 113/50  Pulse:   93 98  Resp:   20 16  Temp:    98.9 F (37.2 C)  TempSrc:    Oral  SpO2: 91% 99% 96% 100%  Weight:      Height:       Eyes: Anicteric no pallor. ENMT: No discharge from the ears eyes nose or mouth. Neck: Patient is wearing a cervical collar. Respiratory: No rhonchi or crepitations. Cardiovascular: S1-S2 heard. Abdomen: Soft nontender bowel sound present. Musculoskeletal: Chronic skin changes. Skin: Decubitus on the sacral area.  Unstageable. Neurologic: Alert awake oriented to time place and person moving all extremities. Psychiatric: Oriented to time place and person.   Labs on Admission: I have personally reviewed following labs and imaging studies  CBC: Recent Labs  Lab 10/18/23 1905 10/18/23 2059  WBC 8.2  --   HGB 7.8* 7.1*  HCT 23.8* 21.0*  MCV 108.2*  --   PLT 181  --    Basic Metabolic Panel: Recent Labs  Lab 10/18/23 1905 10/18/23 2005 10/18/23 2059  NA 135  --  134*  K 3.8  --  3.6  CL 97*  --   --   CO2 24  --   --   GLUCOSE 131*  --   --   BUN 39*  --   --   CREATININE 1.71*  --   --   CALCIUM  9.7  --   --   MG  --  2.2  --    GFR: Estimated Creatinine Clearance: 19.4 mL/min (A) (by C-G formula based on SCr of 1.71 mg/dL (H)). Liver Function  Tests: Recent Labs  Lab 10/18/23 1905  AST 16  ALT <5  ALKPHOS 38  BILITOT 0.6  PROT 7.2  ALBUMIN 3.7   No results for input(s): LIPASE, AMYLASE in the last 168 hours. Recent Labs  Lab 10/18/23 2005  AMMONIA <13   Coagulation Profile: No results for input(s): INR, PROTIME in the last 168 hours. Cardiac Enzymes: No results for input(s): CKTOTAL, CKMB, CKMBINDEX, TROPONINI in the last 168 hours. BNP (last 3 results) No results for input(s): PROBNP in the last 8760 hours. HbA1C: No results for input(s): HGBA1C in the last 72 hours. CBG: Recent Labs  Lab 10/18/23 1952  GLUCAP 125*   Lipid Profile: No results for input(s): CHOL, HDL, LDLCALC, TRIG, CHOLHDL, LDLDIRECT in the last 72 hours. Thyroid Function Tests: No results for input(s): TSH, T4TOTAL, FREET4, T3FREE, THYROIDAB in the last 72 hours. Anemia Panel: No results for input(s): VITAMINB12, FOLATE, FERRITIN, TIBC, IRON, RETICCTPCT in the last 72 hours. Urine analysis:    Component Value Date/Time   COLORURINE YELLOW 10/18/2023 2240   APPEARANCEUR CLEAR 10/18/2023 2240   APPEARANCEUR Clear 05/10/2023 0000   LABSPEC <=1.005 10/18/2023 2240   PHURINE 6.0 10/18/2023 2240   GLUCOSEU NEGATIVE 10/18/2023 2240   HGBUR NEGATIVE 10/18/2023 2240   BILIRUBINUR NEGATIVE 10/18/2023 2240   BILIRUBINUR 1+ (A) 05/10/2023 0000   KETONESUR NEGATIVE 10/18/2023 2240   PROTEINUR NEGATIVE 10/18/2023 2240   NITRITE NEGATIVE 10/18/2023 2240   LEUKOCYTESUR MODERATE (A) 10/18/2023 2240   Sepsis Labs: @LABRCNTIP (procalcitonin:4,lacticidven:4) )No results found for this or any previous visit (from the past 240 hours).   Radiological Exams on Admission: CT Head Wo Contrast Result Date: 10/18/2023 CLINICAL DATA:  Mental status change, unknown cause 83 y/o female with AMS. Pt has had multiple falls with fractures,pain has increased in left side of head since Friday. Family reports  increased AMS since med changes last week. EXAM: CT HEAD WITHOUT CONTRAST CT CERVICAL SPINE WITHOUT CONTRAST TECHNIQUE: Multidetector CT imaging of the head and cervical spine was performed following the standard protocol without intravenous contrast. Multiplanar CT image reconstructions of the cervical spine were also generated. RADIATION DOSE REDUCTION: This exam was performed according to the departmental dose-optimization program which includes automated exposure control, adjustment of the mA and/or kV according to patient size and/or use of iterative reconstruction technique. COMPARISON:  CT head FINDINGS: CT HEAD FINDINGS Brain: Patchy and confluent areas of decreased attenuation are noted throughout the deep and periventricular white matter of the cerebral hemispheres bilaterally, compatible with chronic microvascular ischemic disease. No evidence of large-territorial acute infarction. No parenchymal hemorrhage. No mass lesion. No extra-axial collection. No mass effect or midline shift. No hydrocephalus. Basilar cisterns are patent. Vascular: No hyperdense vessel. Skull: No acute fracture or focal lesion. Sinuses/Orbits: Paranasal sinuses and mastoid air cells are clear. Bilateral lens replacement. Otherwise the orbits are unremarkable. Other: None. CT CERVICAL SPINE  FINDINGS Alignment: Stable grade 1 anterolisthesis of C3 on C4 and C4 on C5. Stable mild retrolisthesis of C5 on C6. Stable grade 1 anterolisthesis of C7 on T1. 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 Interval slight increased distraction (3 mm instead of 1 mm) with no definite periosteal reaction. Redemonstration of subacute chronic right C1 transverse foramina fracture. Acute minimally displaced left posterior arch C1 fracture. Improved alignment of a subacute to chronic type 2 dens fracture with 1 mm posterior displacement sepsis (from 3 mm). Multilevel severe degenerative changes of the  spine. Associated multilevel severe osseous neural foraminal stenosis. No aggressive appearing focal osseous lesion or focal pathologic process. Soft tissues and spinal canal: No prevertebral fluid or swelling. No visible canal hematoma. Upper chest: Unremarkable. Other: Atherosclerotic plaque of the carotid arteries within the neck. IMPRESSION: 1. No acute intracranial abnormality. 2. Acute minimally displaced left posterior arch C1 fracture. 3. 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 Correlate with point tenderness to palpation to evaluate for an acute component. 4. Improved alignment of a subacute to chronic type 2 dens fracture with 1 mm posterior displacement sepsis (from 3 mm). Electronically Signed   By: Morgane  Naveau M.D.   On: 10/18/2023 20:56   CT CERVICAL SPINE WO CONTRAST Result Date: 10/18/2023 CLINICAL DATA:  Mental status change, unknown cause 83 y/o female with AMS. Pt has had multiple falls with fractures,pain has increased in left side of head since Friday. Family reports increased AMS since med changes last week. EXAM: CT HEAD WITHOUT CONTRAST CT CERVICAL SPINE WITHOUT CONTRAST TECHNIQUE: Multidetector CT imaging of the head and cervical spine was performed following the standard protocol without intravenous contrast. Multiplanar CT image reconstructions of the cervical spine were also generated. RADIATION DOSE REDUCTION: This exam was performed according to the departmental dose-optimization program which includes automated exposure control, adjustment of the mA and/or kV according to patient size and/or use of iterative reconstruction technique. COMPARISON:  CT head FINDINGS: CT HEAD FINDINGS Brain: Patchy and confluent areas of decreased attenuation are noted throughout the deep and periventricular white matter of the cerebral hemispheres bilaterally, compatible with chronic microvascular ischemic disease. No evidence of large-territorial acute infarction. No  parenchymal hemorrhage. No mass lesion. No extra-axial collection. No mass effect or midline shift. No hydrocephalus. Basilar cisterns are patent. Vascular: No hyperdense vessel. Skull: No acute fracture or focal lesion. Sinuses/Orbits: Paranasal sinuses and mastoid air cells are clear. Bilateral lens replacement. Otherwise the orbits are unremarkable. Other: None. CT CERVICAL SPINE FINDINGS Alignment: Stable grade 1 anterolisthesis of C3 on C4 and C4 on C5. Stable mild retrolisthesis of C5 on C6. Stable grade 1 anterolisthesis of C7 on T1. 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 Interval slight increased distraction (3 mm instead of 1 mm) with no definite periosteal reaction. Redemonstration of subacute chronic right C1 transverse foramina fracture. Acute minimally displaced left posterior arch C1 fracture. Improved alignment of a subacute to chronic type 2 dens fracture with 1 mm posterior displacement sepsis (from 3 mm). Multilevel severe degenerative changes of the spine. Associated multilevel severe osseous neural foraminal stenosis. No aggressive appearing focal osseous lesion or focal pathologic process. Soft tissues and spinal canal: No prevertebral fluid or swelling. No visible canal hematoma. Upper chest: Unremarkable. Other: Atherosclerotic plaque of the carotid arteries within the neck. IMPRESSION: 1. No acute intracranial abnormality. 2. Acute minimally displaced left posterior arch C1 fracture. 3. Subacute to  chronic nonunionized right anterior arch C1 fracture. Correlate with point tenderness to palpation to evaluate for an acute component. 4. Improved alignment of a subacute to chronic type 2 dens fracture with 1 mm posterior displacement sepsis (from 3 mm). Electronically Signed   By: Morgane  Naveau M.D.   On: 10/18/2023 20:56   DG Chest Port 1 View Result Date: 10/18/2023 CLINICAL DATA:  Hypoxia. Multiple falls with fractures. Increasing  altered mental status. EXAM: PORTABLE CHEST 1 VIEW COMPARISON:  CT chest abdomen and pelvis 09/22/2023 FINDINGS: Shallow inspiration. Heart size and pulmonary vascularity are normal for technique. Infiltration or atelectasis suggested behind the heart in the left base. No pleural effusion or pneumothorax. Calcified and tortuous aorta. Degenerative changes in the spine and right shoulder. Postoperative left shoulder arthroplasty. IMPRESSION: Shallow inspiration. Infiltration or atelectasis in the left lung base behind the heart. Electronically Signed   By: Elsie Gravely M.D.   On: 10/18/2023 20:35    EKG: Independently reviewed.  Normal sinus rhythm with APCs and PVC.  Assessment/Plan Principal Problem:   Acute encephalopathy Active Problems:   Malignant neoplasm of central portion of left breast in female, estrogen receptor positive (HCC)   LBBB (left bundle branch block)   Mixed dyslipidemia   Essential hypertension   History of UTI   Symptomatic anemia    Acute encephalopathy suspect likely medication related with recent increase in Lyrica  and addition of Remeron.  Patient also was taking hydrocodone  for the neck pain.  Will hold Lyrica  for Remeron and hydrocodone  for now.  Mentation seems to be improving at this time at the time of my exam.  Will restart Lyrica  at a lower dose later once mentation stabilizes.  Continue hydration. Possible UTI on levofloxacin  follow cultures.  Likely contributing to #1. 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  Receiving fluids.  Follow metabolic panel.  Hold finerenone . C1-C2 fracture on collar.  CT C-spine done today does show acute minimally displaced left posterior C1 fracture.  Patient does have known C1-C2 fracture and has follow-up with neurosurgeon Dr. Signe Cramp.  Need to discuss with neurosurgeon in the morning.  Continue cervical collar. Hypertension blood pressure was in the low normal.  Holding calcium  channel  blocker and fentanyl  for now.  Receiving fluids. Symptomatic anemia recently admitted received blood transfusion has follow-up with GI and oncology for further workup. Breast cancer on tamoxifen . Hyperlipidemia on statins. Sacral decubitus ulcer consult wound team.  Since patient has acute encephalopathy will need close monitoring further workup and more than 2 midnight stay.   DVT prophylaxis: SCDs. Code Status: Full code. Family Communication: Patient's husband and daughter at the bedside. Disposition Plan: Will follow. Consults called: None.  Will need to discuss with neurosurgery in the morning. Admission status: Observation.

## 2023-10-19 NOTE — Progress Notes (Signed)
 MEWS Progress Note  Patient Details Name: Elorah Dewing MRN: [id number] DOB: 02-12-1941 Today's Date: 10/19/2023   MEWS Flowsheet Documentation:  Assess: MEWS Score Temp: (!) 100.6 F (38.1 C) BP: (!) 123/48 MAP (mmHg): 70 Pulse Rate: (!) 113 ECG Heart Rate: (!) 116 Resp: (!) 21 Level of Consciousness: Alert SpO2: 100 % O2 Device: Room Air O2 Flow Rate (L/min): 1 L/min Assess: MEWS Score MEWS Temp: 1 MEWS Systolic: 0 MEWS Pulse: 2 MEWS RR: 1 MEWS LOC: 0 MEWS Score: 4 MEWS Score Color: Red Assess: SIRS CRITERIA SIRS Temperature : 0 SIRS Respirations : 1 SIRS Pulse: 1 SIRS WBC: 0 SIRS Score Sum : 2 SIRS Temperature : 0 SIRS Pulse: 1 SIRS Respirations : 1 SIRS WBC: 0 SIRS Score Sum : 2 Assess: if the MEWS score is Yellow or Red Were vital signs accurate and taken at a resting state?: Yes Does the patient meet 2 or more of the SIRS criteria?: Yes Does the patient have a confirmed or suspected source of infection?: Yes MEWS guidelines implemented : Yes, red Treat MEWS Interventions: Considered administering scheduled or prn medications/treatments as ordered Take Vital Signs Increase Vital Sign Frequency : Red: Q1hr x2, continue Q4hrs until patient remains green for 12hrs Escalate MEWS: Escalate: Red: Discuss with charge nurse and notify provider. Consider notifying RRT. If remains red for 2 hours consider need for higher level of care        Jaymie Misch A 10/19/2023, 4:05 PM

## 2023-10-19 NOTE — Care Management Obs Status (Signed)
 MEDICARE OBSERVATION STATUS NOTIFICATION   Patient Details  Name: Heidi Lutz MRN: [id number] Date of Birth: 06-22-1940   Medicare Observation Status Notification Given:  Yes    Andrez JULIANNA George, RN 10/19/2023, 1:39 PM

## 2023-10-20 DIAGNOSIS — G9340 Encephalopathy, unspecified: Secondary | ICD-10-CM | POA: Diagnosis not present

## 2023-10-20 LAB — BASIC METABOLIC PANEL WITH GFR
Anion gap: 11 (ref 5–15)
BUN: 22 mg/dL (ref 8–23)
CO2: 24 mmol/L (ref 22–32)
Calcium: 8.5 mg/dL — ABNORMAL LOW (ref 8.9–10.3)
Chloride: 103 mmol/L (ref 98–111)
Creatinine, Ser: 1.31 mg/dL — ABNORMAL HIGH (ref 0.44–1.00)
GFR, Estimated: 40 mL/min — ABNORMAL LOW (ref >60)
Glucose, Bld: 104 mg/dL — ABNORMAL HIGH (ref 70–99)
Potassium: 3.6 mmol/L (ref 3.5–5.1)
Sodium: 138 mmol/L (ref 135–145)

## 2023-10-20 LAB — OCCULT BLOOD X 1 CARD TO LAB, STOOL: Fecal Occult Bld: NEGATIVE

## 2023-10-20 MED ORDER — FLUTICASONE PROPIONATE 50 MCG/ACT NA SUSP
1.0000 | Freq: Every day | NASAL | Status: DC
  Administered 2023-10-20 – 2023-10-22 (×3): 1 via NASAL
  Filled 2023-10-20: qty 16

## 2023-10-20 MED ORDER — PREGABALIN 75 MG PO CAPS
75.0000 mg | ORAL_CAPSULE | Freq: Every day | ORAL | Status: DC
  Administered 2023-10-20 – 2023-10-22 (×3): 75 mg via ORAL
  Filled 2023-10-20 (×3): qty 1

## 2023-10-20 NOTE — Plan of Care (Signed)
  Problem: Clinical Measurements: Goal: Ability to maintain clinical measurements within normal limits will improve Outcome: Progressing Goal: Will remain free from infection Outcome: Progressing   Problem: Activity: Goal: Risk for activity intolerance will decrease Outcome: Progressing   Problem: Coping: Goal: Level of anxiety will decrease Outcome: Progressing   Problem: Elimination: Goal: Will not experience complications related to bowel motility Outcome: Progressing

## 2023-10-20 NOTE — Progress Notes (Addendum)
 PROGRESS NOTE    Heidi Lutz  FMW:[id number] DOB: February 20, 1941 DOA: 10/18/2023 PCP: Burney Darice CROME, MD   Brief Narrative:   83 y.o. female with history of hypertension hyperlipidemia, breast cancer on tamoxifen , recently admitted for symptomatic anemia plan to have EGD as outpatient also was diagnosed with C1-C2 fracture has follow-up with Dr. Mavis neurosurgeon on cervical collar was found to be increasingly confused over the last couple of days and was brought to the ER. Etiology appeared to be polypharmacy as patient was recently started on Remeron. Cervical spine collar in place. Working with PT OT. Likely disposition would be home with HHS.   Assessment & Plan:  Principal Problem:   Acute encephalopathy Active Problems:   Malignant neoplasm of central portion of left breast in female, estrogen receptor positive (HCC)   LBBB (left bundle branch block)   Mixed dyslipidemia   Essential hypertension   History of UTI   Symptomatic anemia   Acute metabolic encephalopathy,POA:  Resolved now. suspect likely medication related with recent increase in Lyrica , addition of Remeron and being on opioids. Restarted opioids for neck pain on 8/28 Restarted Lyrica  on 8/29 at a lower dose Continue to hold remeron  Possible acute uncomplicated UTI secondary to GNR,POA: Continue with cefadroxil  F/u final urine culture results  Chronic disease stage IIIA: No acute issues  C1-C2 fracture on collar.  CT C-spine done during this admission showed acute minimally displaced left posterior C1 fracture.   Patient does have known C1-C2 fracture and has been following up with Neurosurgeon Dr Mavis I spoke to Dr Mavis on 8/28 and he said that patient is a poor surgical candidate, recommended conservative management by keeping C-spine collar in place and he will see her as an outpatient on her scheduled appointment next week.  Hypertension: Holding calcium  channel blocker   Symptomatic anemia  recently admitted received blood transfusion has follow-up with GI and oncology for further workup. F/u fecal occult testing F/u H&H closely Iron panel done on 7/30 showed iron 49, TIBC 182, saturation of 27 and Ferritin of 494.  Breast cancer: on tamoxifen .  Hyperlipidemia on statins.  Disposition: Lives at home with her husband.    DVT prophylaxis: heparin  injection 5,000 Units Start: 10/19/23 1015 Place and maintain sequential compression device Start: 10/19/23 0542     Code Status: Full Code Family Communication:  Husband and daughter at the bedside. Status is: Observation The patient remains OBS appropriate and will d/c before 2 midnights.    Subjective:  She feels much better today. Neck pain is at 2/10. Alert, awake and oriented. Husband and daughter are present at the bedside. She did work with PT some yesterday but was not able to do much. She is looking forward to work with PT today.   Examination:  General exam: Appears calm and comfortable, C-spine collar in place  Respiratory system: Clear to auscultation. Respiratory effort normal. Cardiovascular system: S1 & S2 heard, RRR. No JVD, murmurs, rubs, gallops or clicks. No pedal edema. Gastrointestinal system: Abdomen is nondistended, soft and nontender. No organomegaly or masses felt. Normal bowel sounds heard. Central nervous system: Alert and oriented. No focal neurological deficits. Extremities: Symmetric 5 x 5 power. Skin: No rashes, lesions or ulcers Psychiatry: Judgement and insight appear normal. Mood & affect appropriate.       Diet Orders (From admission, onward)     Start     Ordered   10/19/23 0416  Diet Heart Room service appropriate? Yes; Fluid consistency: Thin  Diet effective now       Question Answer Comment  Room service appropriate? Yes   Fluid consistency: Thin      10/19/23 0415            Objective: Vitals:   10/19/23 1950 10/19/23 2341 10/20/23 0309 10/20/23 0735  BP: (!)  102/47 (!) 109/48 121/63 (!) 112/57  Pulse: (!) 103 98 (!) 109 96  Resp: 19 18 16    Temp: 99.2 F (37.3 C) 98.9 F (37.2 C) 98.5 F (36.9 C) 98.5 F (36.9 C)  TempSrc: Oral Oral Oral Oral  SpO2: 94% 93% 92% 90%  Weight:      Height:        Intake/Output Summary (Last 24 hours) at 10/20/2023 1035 Last data filed at 10/19/2023 1800 Gross per 24 hour  Intake 1354.24 ml  Output 400 ml  Net 954.24 ml   Filed Weights   10/18/23 1903  Weight: 53.5 kg    Scheduled Meds:  atorvastatin   20 mg Oral QHS   cefadroxil   500 mg Oral Daily   heparin  injection (subcutaneous)  5,000 Units Subcutaneous Q12H   leptospermum manuka honey  1 Application Topical Daily   magnesium  oxide  200 mg Oral Daily   multivitamin  1 tablet Oral Daily   pantoprazole   40 mg Oral Daily   tamoxifen   20 mg Oral Daily   Continuous Infusions:  Nutritional status     Body mass index is 21.58 kg/m.  Data Reviewed:   CBC: Recent Labs  Lab 10/18/23 1905 10/18/23 2059 10/19/23 0900  WBC 8.2  --  6.3  NEUTROABS  --   --  3.9  HGB 7.8* 7.1* 7.4*  HCT 23.8* 21.0* 22.5*  MCV 108.2*  --  108.7*  PLT 181  --  166   Basic Metabolic Panel: Recent Labs  Lab 10/18/23 1905 10/18/23 2005 10/18/23 2059 10/19/23 0900 10/20/23 0258  NA 135  --  134* 137 138  K 3.8  --  3.6 3.4* 3.6  CL 97*  --   --  100 103  CO2 24  --   --  25 24  GLUCOSE 131*  --   --  103* 104*  BUN 39*  --   --  30* 22  CREATININE 1.71*  --   --  1.25* 1.31*  CALCIUM  9.7  --   --  8.9 8.5*  MG  --  2.2  --   --   --    GFR: Estimated Creatinine Clearance: 25.3 mL/min (A) (by C-G formula based on SCr of 1.31 mg/dL (H)). Liver Function Tests: Recent Labs  Lab 10/18/23 1905 10/19/23 0900  AST 16 18  ALT <5 8  ALKPHOS 38 32*  BILITOT 0.6 0.7  PROT 7.2 6.2*  ALBUMIN 3.7 2.6*   No results for input(s): LIPASE, AMYLASE in the last 168 hours. Recent Labs  Lab 10/18/23 2005  AMMONIA <13   Coagulation Profile: No  results for input(s): INR, PROTIME in the last 168 hours. Cardiac Enzymes: No results for input(s): CKTOTAL, CKMB, CKMBINDEX, TROPONINI in the last 168 hours. BNP (last 3 results) No results for input(s): PROBNP in the last 8760 hours. HbA1C: No results for input(s): HGBA1C in the last 72 hours. CBG: Recent Labs  Lab 10/18/23 1952  GLUCAP 125*   Lipid Profile: No results for input(s): CHOL, HDL, LDLCALC, TRIG, CHOLHDL, LDLDIRECT in the last 72 hours. Thyroid Function Tests: Recent Labs    10/19/23 0900  TSH  3.020   Anemia Panel: No results for input(s): VITAMINB12, FOLATE, FERRITIN, TIBC, IRON, RETICCTPCT in the last 72 hours. Sepsis Labs: Recent Labs  Lab 10/18/23 2005 10/19/23 0426  LATICACIDVEN 1.1 1.0    Recent Results (from the past 240 hours)  Culture, blood (routine x 2)     Status: None (Preliminary result)   Collection Time: 10/18/23 12:15 AM   Specimen: BLOOD  Result Value Ref Range Status   Specimen Description   Final    BLOOD RIGHT ANTECUBITAL Performed at [facility], [address] [place], [place] [address]    Special Requests   Final    BOTTLES DRAWN AEROBIC AND ANAEROBIC Blood Culture results may not be optimal due to an inadequate volume of blood received in culture bottles Performed at [facility], [address] [place], [place] [address]    Culture   Final    NO GROWTH < 24 HOURS Performed at [facility], [address], [place], [place] [address]    Report Status PENDING  Incomplete  Culture, blood (routine x 2)     Status: None (Preliminary result)   Collection Time: 10/18/23 12:30 AM   Specimen: BLOOD RIGHT HAND  Result Value Ref Range Status   Specimen Description   Final    BLOOD RIGHT HAND Performed at [facility], [address] [place], [place] [address]    Special Requests   Final    BOTTLES DRAWN AEROBIC AND ANAEROBIC Blood Culture results may  not be optimal due to an inadequate volume of blood received in culture bottles Performed at [facility], [address] [place], [place] [address]    Culture   Final    NO GROWTH < 24 HOURS Performed at [facility], [address], [place], [place] [address]    Report Status PENDING  Incomplete  Urine Culture     Status: Abnormal (Preliminary result)   Collection Time: 10/18/23 11:23 PM   Specimen: Urine, Catheterized  Result Value Ref Range Status   Specimen Description   Final    URINE, CATHETERIZED Performed at [facility], [address] [place], [place] [address]    Special Requests   Final    NONE Performed at [facility], [address] [place], [place] [address]    Culture (A)  Final    >=100,000 COLONIES/mL VONNE NEGATIVE RODS SUSCEPTIBILITIES TO FOLLOW Performed at [facility], [address] [place], [place] [address]    Report Status PENDING  Incomplete         Radiology Studies: CT Head Wo Contrast Result Date: 10/18/2023 CLINICAL DATA:  Mental status change, unknown cause 83 y/o female with AMS. Pt has had multiple falls with fractures,pain has increased in left side of head since Friday. Family reports increased AMS since med changes last week. EXAM: CT HEAD WITHOUT CONTRAST CT CERVICAL SPINE WITHOUT CONTRAST TECHNIQUE: Multidetector CT imaging of the head and cervical spine was performed following the standard protocol without intravenous contrast. Multiplanar CT image reconstructions of the cervical spine were also generated. RADIATION DOSE REDUCTION: This exam was performed according to the departmental dose-optimization program which includes automated exposure control, adjustment of the mA and/or kV according to patient size and/or use of iterative reconstruction technique. COMPARISON:  CT head FINDINGS: CT HEAD FINDINGS Brain: Patchy and confluent areas of decreased attenuation are noted throughout the  deep  and periventricular white matter of the cerebral hemispheres bilaterally, compatible with chronic microvascular ischemic disease. No evidence of large-territorial acute infarction. No parenchymal hemorrhage. No mass lesion. No extra-axial collection. No mass effect or midline shift. No hydrocephalus. Basilar cisterns are patent. Vascular: No hyperdense vessel. Skull: No acute fracture or focal lesion. Sinuses/Orbits: Paranasal sinuses and mastoid air cells are clear. Bilateral lens replacement. Otherwise the orbits are unremarkable. Other: None. CT CERVICAL SPINE FINDINGS Alignment: Stable grade 1 anterolisthesis of C3 on C4 and C4 on C5. Stable mild retrolisthesis of C5 on C6. Stable grade 1 anterolisthesis of C7 on T1. 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 Interval slight increased distraction (3 mm instead of 1 mm) with no definite periosteal reaction. Redemonstration of subacute chronic right C1 transverse foramina fracture. Acute minimally displaced left posterior arch C1 fracture. Improved alignment of a subacute to chronic type 2 dens fracture with 1 mm posterior displacement sepsis (from 3 mm). Multilevel severe degenerative changes of the spine. Associated multilevel severe osseous neural foraminal stenosis. No aggressive appearing focal osseous lesion or focal pathologic process. Soft tissues and spinal canal: No prevertebral fluid or swelling. No visible canal hematoma. Upper chest: Unremarkable. Other: Atherosclerotic plaque of the carotid arteries within the neck. IMPRESSION: 1. No acute intracranial abnormality. 2. Acute minimally displaced left posterior arch C1 fracture. 3. 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 Correlate with point tenderness to palpation to evaluate for an acute component. 4. Improved alignment of a subacute to chronic type 2 dens fracture with 1 mm posterior displacement sepsis (from 3 mm).  Electronically Signed   By: Morgane  Naveau M.D.   On: 10/18/2023 20:56   CT CERVICAL SPINE WO CONTRAST Result Date: 10/18/2023 CLINICAL DATA:  Mental status change, unknown cause 83 y/o female with AMS. Pt has had multiple falls with fractures,pain has increased in left side of head since Friday. Family reports increased AMS since med changes last week. EXAM: CT HEAD WITHOUT CONTRAST CT CERVICAL SPINE WITHOUT CONTRAST TECHNIQUE: Multidetector CT imaging of the head and cervical spine was performed following the standard protocol without intravenous contrast. Multiplanar CT image reconstructions of the cervical spine were also generated. RADIATION DOSE REDUCTION: This exam was performed according to the departmental dose-optimization program which includes automated exposure control, adjustment of the mA and/or kV according to patient size and/or use of iterative reconstruction technique. COMPARISON:  CT head FINDINGS: CT HEAD FINDINGS Brain: Patchy and confluent areas of decreased attenuation are noted throughout the deep and periventricular white matter of the cerebral hemispheres bilaterally, compatible with chronic microvascular ischemic disease. No evidence of large-territorial acute infarction. No parenchymal hemorrhage. No mass lesion. No extra-axial collection. No mass effect or midline shift. No hydrocephalus. Basilar cisterns are patent. Vascular: No hyperdense vessel. Skull: No acute fracture or focal lesion. Sinuses/Orbits: Paranasal sinuses and mastoid air cells are clear. Bilateral lens replacement. Otherwise the orbits are unremarkable. Other: None. CT CERVICAL SPINE FINDINGS Alignment: Stable grade 1 anterolisthesis of C3 on C4 and C4 on C5. Stable mild retrolisthesis of C5 on C6. Stable grade 1 anterolisthesis of C7 on T1. 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 Interval slight increased distraction (3 mm instead of 1 mm) with no definite  periosteal reaction. Redemonstration of subacute chronic right C1 transverse foramina fracture. Acute minimally displaced left posterior arch C1 fracture. Improved alignment of a subacute to chronic type 2 dens fracture with  1 mm posterior displacement sepsis (from 3 mm). Multilevel severe degenerative changes of the spine. Associated multilevel severe osseous neural foraminal stenosis. No aggressive appearing focal osseous lesion or focal pathologic process. Soft tissues and spinal canal: No prevertebral fluid or swelling. No visible canal hematoma. Upper chest: Unremarkable. Other: Atherosclerotic plaque of the carotid arteries within the neck. IMPRESSION: 1. No acute intracranial abnormality. 2. Acute minimally displaced left posterior arch C1 fracture. 3. 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 Correlate with point tenderness to palpation to evaluate for an acute component. 4. Improved alignment of a subacute to chronic type 2 dens fracture with 1 mm posterior displacement sepsis (from 3 mm). Electronically Signed   By: Morgane  Naveau M.D.   On: 10/18/2023 20:56   DG Chest Port 1 View Result Date: 10/18/2023 CLINICAL DATA:  Hypoxia. Multiple falls with fractures. Increasing altered mental status. EXAM: PORTABLE CHEST 1 VIEW COMPARISON:  CT chest abdomen and pelvis 09/22/2023 FINDINGS: Shallow inspiration. Heart size and pulmonary vascularity are normal for technique. Infiltration or atelectasis suggested behind the heart in the left base. No pleural effusion or pneumothorax. Calcified and tortuous aorta. Degenerative changes in the spine and right shoulder. Postoperative left shoulder arthroplasty. IMPRESSION: Shallow inspiration. Infiltration or atelectasis in the left lung base behind the heart. Electronically Signed   By: Elsie Gravely M.D.   On: 10/18/2023 20:35         LOS: 0 days   Time spent= 41 mins    [name], MD [facility]  If 7PM-7AM,  please contact night-coverage  10/20/2023, 10:35 AM

## 2023-10-20 NOTE — TOC Progression Note (Signed)
 Transition of Care [facility]) - Progression Note    Patient Details  Name: Heidi Lutz MRN: [id number] Date of Birth: Aug 05, 1940  [facility]) CM/SW Contact  Corean JAYSON Canary, RN Phone Number: 10/20/2023, 1:15 PM  Clinical Narrative:     Patient daughter expresses interest in Home health and it was recommended. Patient had [facility] previously, called local office to accept for PT and OT, has all DME needed, Need face to face orders.  Expected Discharge Plan: Home w Home Health Services Barriers to Discharge: Continued Medical Work up               Expected Discharge Plan and Services   Discharge Planning Services: CM Consult Post Acute Care Choice: Home Health Living arrangements for the past 2 months: Apartment                           HH Arranged: PT, OT HH Agency: [facility] Date [facility] Agency Contacted: 10/20/23 Time HH Agency Contacted: 1315 Representative spoke with at [facility] Agency: [place]   Social Drivers of Health (SDOH) Interventions SDOH Screenings   Food Insecurity: No Food Insecurity (10/19/2023)  Housing: Low Risk  (10/19/2023)  Transportation Needs: No Transportation Needs (10/19/2023)  Utilities: Not At Risk (10/19/2023)  Depression (PHQ2-9): Low Risk  (03/20/2020)  Financial Resource Strain: Low Risk  (05/03/2021)   Received from [facility]  Physical Activity: Insufficiently Active (05/03/2021)   Received from [facility]  Social Connections: Unknown (10/19/2023)  Stress: No Stress Concern Present (05/03/2021)   Received from [facility]  Tobacco Use: Medium Risk (10/19/2023)    Readmission Risk Interventions     No data to display

## 2023-10-20 NOTE — Progress Notes (Signed)
 Physical Therapy Treatment Patient Details Name: Heidi Lutz MRN: [id number] DOB: 1940-05-02 Today's Date: 10/20/2023   History of Present Illness Patient is a 83 year old female with acute encephalopathy suspected medication related, possible UTI. Known recent C1-C2 fracture with hard collar recommended. PMH: HTN, HLD, breast cancer, symptomatic anemia.    PT Comments  Increased activity tolerance and better pain control today compared to yesterday. Patient walked 3 short bouts with CGA using a rolling walker and also with her own personal 4 wheeled walker. Safety cues provided during mobility and education provided to patient and family on positioning for comfort, increasing activity slowly, etc. Family hopeful for discharge home soon. PT will continue to follow.    If plan is discharge home, recommend the following: A little help with walking and/or transfers;A little help with bathing/dressing/bathroom;Assist for transportation;Help with stairs or ramp for entrance;Assistance with cooking/housework   Can travel by private vehicle        Equipment Recommendations  None recommended by PT    Recommendations for Other Services       Precautions / Restrictions Precautions Precautions: Fall;Cervical Precaution Booklet Issued: No Recall of Precautions/Restrictions: Impaired Required Braces or Orthoses: Cervical Brace Restrictions Weight Bearing Restrictions Per Provider Order: No     Mobility  Bed Mobility Overal bed mobility: Needs Assistance Bed Mobility: Rolling, Sidelying to Sit, Sit to Sidelying Rolling: Contact guard assist Sidelying to sit: Contact guard assist     Sit to sidelying: Supervision General bed mobility comments: logroll technique encouraged    Transfers Overall transfer level: Needs assistance Equipment used: Rolling walker (2 wheels), Rollator (4 wheels) Transfers: Sit to/from Stand Sit to Stand: Min assist, Contact guard assist            General transfer comment: 4 standing bouts performed. increased independence with repeated stands. used rolling walker and also patient's own rollator    Ambulation/Gait Ambulation/Gait assistance: Contact guard assist, Supervision Gait Distance (Feet):  (58ft x 3) Assistive device: Rolling walker (2 wheels), Rollator (4 wheels) Gait Pattern/deviations: Step-through pattern, Decreased stride length Gait velocity: decreased     General Gait Details: patient walked 3 bouts in the room with short seated rest breaks between bouts of walking. used rolling walker and patient's own rollator in the room. no gross loss of balance with either device. demonstrated how to use the rollator for seated rest break if needed and encouraged patient to use assistive device for fall prevention. patient declined hallway ambulation but likely could walk further. activity tolerance limited by fatigue   Stairs             Wheelchair Mobility     Tilt Bed    Modified Rankin (Stroke Patients Only)       Balance Overall balance assessment: Needs assistance Sitting-balance support: Feet supported, No upper extremity supported Sitting balance-Leahy Scale: Fair     Standing balance support: Bilateral upper extremity supported Standing balance-Leahy Scale: Poor (poor to fair) Standing balance comment: relying on rolling walker for support in standing. close stand by assistance provided by therapist for safety                            Communication Communication Communication: No apparent difficulties  Cognition Arousal: Alert Behavior During Therapy: [facility] for tasks assessed/performed   PT - Cognitive impairments: Memory  Following commands: Impaired Following commands impaired: Follows one step commands with increased time    Cueing Cueing Techniques: Verbal cues  Exercises      General Comments General comments (skin integrity, edema,  etc.): cervical collar in proper position. education provided to patient and family on positioning for comfort and progressing activity slowly in the home. patient's family is planning to provide 24/7 supervision for mobility after this hospital stay      Pertinent Vitals/Pain Pain Assessment Pain Assessment: 0-10 Pain Score: 2  Pain Location: posterior neck Pain Descriptors / Indicators: Discomfort Pain Intervention(s): Premedicated before session, Monitored during session, Limited activity within patient's tolerance, Repositioned    Home Living                          Prior Function            PT Goals (current goals can now be found in the care plan section) Acute Rehab PT Goals Patient Stated Goal: to return home with home health PT PT Goal Formulation: With patient/family Time For Goal Achievement: 11/02/23 Potential to Achieve Goals: Fair Progress towards PT goals: Progressing toward goals    Frequency    Min 2X/week      PT Plan      Co-evaluation              AM-PAC PT 6 Clicks Mobility   Outcome Measure  Help needed turning from your back to your side while in a flat bed without using bedrails?: None Help needed moving from lying on your back to sitting on the side of a flat bed without using bedrails?: A Little Help needed moving to and from a bed to a chair (including a wheelchair)?: A Little Help needed standing up from a chair using your arms (e.g., wheelchair or bedside chair)?: A Little Help needed to walk in hospital room?: A Little Help needed climbing 3-5 steps with a railing? : A Little 6 Click Score: 19    End of Session Equipment Utilized During Treatment: Cervical collar Activity Tolerance: Patient tolerated treatment well Patient left: in bed;with call bell/phone within reach;with bed alarm set;with family/visitor present Nurse Communication: Mobility status PT Visit Diagnosis: Muscle weakness (generalized)  (M62.81);Unsteadiness on feet (R26.81)     Time: 8841-8771 PT Time Calculation (min) (ACUTE ONLY): 30 min  Charges:    $Therapeutic Activity: 23-37 mins PT General Charges $$ ACUTE PT VISIT: 1 Visit                     Randine Essex, PT, MPT   Randine LULLA Essex 10/20/2023, 1:08 PM

## 2023-10-21 DIAGNOSIS — G9340 Encephalopathy, unspecified: Secondary | ICD-10-CM | POA: Diagnosis not present

## 2023-10-21 LAB — CBC WITH DIFFERENTIAL/PLATELET
Abs Immature Granulocytes: 0.03 K/uL (ref 0.00–0.07)
Basophils Absolute: 0.1 K/uL (ref 0.0–0.1)
Basophils Relative: 1 %
Eosinophils Absolute: 0.2 K/uL (ref 0.0–0.5)
Eosinophils Relative: 5 %
HCT: 20.1 % — ABNORMAL LOW (ref 36.0–46.0)
Hemoglobin: 6.5 g/dL — CL (ref 12.0–15.0)
Immature Granulocytes: 1 %
Lymphocytes Relative: 31 %
Lymphs Abs: 1.2 K/uL (ref 0.7–4.0)
MCH: 35.9 pg — ABNORMAL HIGH (ref 26.0–34.0)
MCHC: 32.3 g/dL (ref 30.0–36.0)
MCV: 111 fL — ABNORMAL HIGH (ref 80.0–100.0)
Monocytes Absolute: 0.5 K/uL (ref 0.1–1.0)
Monocytes Relative: 14 %
Neutro Abs: 1.9 K/uL (ref 1.7–7.7)
Neutrophils Relative %: 48 %
Platelets: 136 K/uL — ABNORMAL LOW (ref 150–400)
RBC: 1.81 MIL/uL — ABNORMAL LOW (ref 3.87–5.11)
RDW: 20 % — ABNORMAL HIGH (ref 11.5–15.5)
Smear Review: NORMAL
WBC: 3.8 K/uL — ABNORMAL LOW (ref 4.0–10.5)
nRBC: 0 % (ref 0.0–0.2)

## 2023-10-21 LAB — PREPARE RBC (CROSSMATCH)

## 2023-10-21 LAB — URINE CULTURE: Culture: >=100000 — AB

## 2023-10-21 MED ORDER — SODIUM CHLORIDE 0.9% IV SOLUTION: Freq: Once | INTRAVENOUS | Status: AC

## 2023-10-21 NOTE — Progress Notes (Signed)
 Physical Therapy Treatment Patient Details Name: Heidi Lutz MRN: [id number] DOB: 1940-06-02 Today's Date: 10/21/2023   History of Present Illness Patient is a 83 year old female with acute encephalopathy suspected medication related, possible UTI. Known recent C1-C2 fracture with hard collar recommended. PMH: HTN, HLD, breast cancer, symptomatic anemia.   PT Comments  Pt progressing well towards acute PT goals. Pt improved in today's session by increasing total gait distance. Pt was able to ambulate ~154ft with rollator and supervision before needing a short seated rest break. Able to perform four sets of x117ft before pt reported increased neck pain and desire to return to room. Cues for safety with rollator throughout session. Educated on car transfer, walking program, and LE exercises to perform in supine. Pt and family feel comfortable discharging home whenever medically stable. Continue to recommend HHPT with acute PT to follow.    HR 104 BPM at rest HR 115-125 BPM during gait   If plan is discharge home, recommend the following: A little help with walking and/or transfers;A little help with bathing/dressing/bathroom;Assist for transportation;Help with stairs or ramp for entrance;Assistance with cooking/housework   Can travel by private vehicle      Yes  Equipment Recommendations  None recommended by PT       Precautions / Restrictions Precautions Precautions: Fall;Cervical Recall of Precautions/Restrictions: Impaired Required Braces or Orthoses: Cervical Brace Cervical Brace: Other (comment) (Aspen collar) Restrictions Weight Bearing Restrictions Per Provider Order: No     Mobility  Bed Mobility Overal bed mobility: Needs Assistance Bed Mobility: Rolling, Sidelying to Sit, Sit to Sidelying Rolling: Contact guard assist Sidelying to sit: Contact guard assist    Sit to sidelying: Supervision General bed mobility comments: cues needed for log roll technique and for  pt to push from the bed instead of reaching for UE support    Transfers Overall transfer level: Needs assistance Equipment used: Rollator (4 wheels) Transfers: Sit to/from Stand Sit to Stand: Contact guard assist    General transfer comment: cues to lock brakes prior to sit<>stand transfer, CGA for safety    Ambulation/Gait Ambulation/Gait assistance: Supervision Gait Distance (Feet): 100 Feet (x4) Assistive device: Rollator (4 wheels) Gait Pattern/deviations: Step-through pattern, Decreased stride length Gait velocity: decreased     General Gait Details: slow and steady gait with frequent short seated rest breaks. Cues to push rollator against a wall prior to locking brakes and turning. Increased pain after 364ft with pt preferring to return to bed      Balance Overall balance assessment: Needs assistance Sitting-balance support: Feet supported, No upper extremity supported Sitting balance-Leahy Scale: Fair     Standing balance support: Bilateral upper extremity supported, During functional activity, Reliant on assistive device for balance Standing balance-Leahy Scale: Poor Standing balance comment: reliant on UE and external support         Communication Communication Communication: No apparent difficulties  Cognition Arousal: Alert Behavior During Therapy: WFL for tasks assessed/performed   PT - Cognitive impairments: Memory    PT - Cognition Comments: decreased short term memory, repetitive cues throughout session Following commands: Impaired Following commands impaired: Follows one step commands inconsistently, Follows one step commands with increased time    Cueing Cueing Techniques: Verbal cues  Exercises General Exercises - Lower Extremity Ankle Circles/Pumps: AROM, Both, Supine (2 reps) Heel Slides: AROM, Right, Supine (x2 reps) Hip ABduction/ADduction: AROM, Right, Supine (x2 reps) Straight Leg Raises: AROM, Right, Supine (x2 reps, 5 sec hold)  Pertinent Vitals/Pain Pain Assessment Pain Assessment: Faces Faces Pain Scale: Hurts even more Pain Location: posterior neck Pain Descriptors / Indicators: Discomfort, Grimacing Pain Intervention(s): Limited activity within patient's tolerance, Monitored during session, Repositioned, Premedicated before session     PT Goals (current goals can now be found in the care plan section) Acute Rehab PT Goals PT Goal Formulation: With patient/family Time For Goal Achievement: 11/02/23 Potential to Achieve Goals: Fair Progress towards PT goals: Progressing toward goals    Frequency    Min 2X/week       AM-PAC PT 6 Clicks Mobility   Outcome Measure  Help needed turning from your back to your side while in a flat bed without using bedrails?: A Little Help needed moving from lying on your back to sitting on the side of a flat bed without using bedrails?: A Little Help needed moving to and from a bed to a chair (including a wheelchair)?: A Little Help needed standing up from a chair using your arms (e.g., wheelchair or bedside chair)?: A Little Help needed to walk in hospital room?: A Little Help needed climbing 3-5 steps with a railing? : A Lot 6 Click Score: 17    End of Session Equipment Utilized During Treatment: Cervical collar;Gait belt Activity Tolerance: Patient tolerated treatment well Patient left: in bed;with call bell/phone within reach;with family/visitor present Nurse Communication: Mobility status PT Visit Diagnosis: Muscle weakness (generalized) (M62.81);Unsteadiness on feet (R26.81)     Time: 9046-8986 PT Time Calculation (min) (ACUTE ONLY): 20 min  Charges:    $Gait Training: 8-22 mins PT General Charges $$ ACUTE PT VISIT: 1 Visit                     Kate ORN, PT, DPT Secure Chat Preferred  Rehab Office [phone]    Kate BRAVO Wendolyn 10/21/2023, 10:33 AM

## 2023-10-21 NOTE — Plan of Care (Signed)
  Problem: Education: Goal: Knowledge of General Education information will improve Description: Including pain rating scale, medication(s)/side effects and non-pharmacologic comfort measures Outcome: Progressing   Problem: Clinical Measurements: Goal: Ability to maintain clinical measurements within normal limits will improve Outcome: Progressing   Problem: Clinical Measurements: Goal: Will remain free from infection Outcome: Progressing   Problem: Clinical Measurements: Goal: Diagnostic test results will improve Outcome: Progressing   Problem: Clinical Measurements: Goal: Respiratory complications will improve Outcome: Progressing   Problem: Clinical Measurements: Goal: Respiratory complications will improve Outcome: Progressing   Problem: Clinical Measurements: Goal: Cardiovascular complication will be avoided Outcome: Progressing   Problem: Activity: Goal: Risk for activity intolerance will decrease Outcome: Progressing   Problem: Nutrition: Goal: Adequate nutrition will be maintained Outcome: Progressing

## 2023-10-21 NOTE — Plan of Care (Signed)
  Problem: Clinical Measurements: Goal: Ability to maintain clinical measurements within normal limits will improve Outcome: Progressing Goal: Will remain free from infection Outcome: Progressing Goal: Diagnostic test results will improve Outcome: Progressing Goal: Respiratory complications will improve Outcome: Progressing Goal: Cardiovascular complication will be avoided Outcome: Progressing   Problem: Activity: Goal: Risk for activity intolerance will decrease Outcome: Progressing   Problem: Coping: Goal: Level of anxiety will decrease Outcome: Progressing   Problem: Elimination: Goal: Will not experience complications related to bowel motility Outcome: Progressing Goal: Will not experience complications related to urinary retention Outcome: Progressing   Problem: Safety: Goal: Ability to remain free from injury will improve Outcome: Progressing   Problem: Skin Integrity: Goal: Risk for impaired skin integrity will decrease Outcome: Progressing

## 2023-10-21 NOTE — Progress Notes (Signed)
 PROGRESS NOTE    Heidi Lutz  FMW:[id number] DOB: Jul 04, 1940 DOA: 10/18/2023 PCP: Burney Darice CROME, MD   Brief Narrative:   83 y.o. female with history of hypertension hyperlipidemia, breast cancer on tamoxifen , recently admitted for symptomatic anemia plan to have EGD as outpatient also was diagnosed with C1-C2 fracture has follow-up with Dr. Mavis neurosurgeon on cervical collar was found to be increasingly confused over the last couple of days and was brought to the ER. Etiology appeared to be polypharmacy as patient was recently started on Remeron. Cervical spine collar in place. Doing well.   Assessment & Plan:  Principal Problem:   Acute encephalopathy Active Problems:   Malignant neoplasm of central portion of left breast in female, estrogen receptor positive (HCC)   LBBB (left bundle branch block)   Mixed dyslipidemia   Essential hypertension   History of UTI   Symptomatic anemia   Acute metabolic encephalopathy,POA:  Resolved now. suspect likely medication related with recent increase in Lyrica , addition of Remeron and being on opioids. Restarted opioids for neck pain on 8/28 Restarted Lyrica  on 8/29 at a lower dose Continue to hold remeron  Possible acute uncomplicated UTI secondary to GNR,POA: Continue with cefadroxil  F/u final urine culture results  Chronic disease stage IIIA: No acute issues  C1-C2 fracture on collar.  CT C-spine done during this admission showed acute minimally displaced left posterior C1 fracture.   Patient does have known C1-C2 fracture and has been following up with Neurosurgeon Dr Mavis I spoke to Dr Mavis on 8/28 and he said that patient is a poor surgical candidate, recommended conservative management by keeping C-spine collar in place and he will see her as an outpatient on her scheduled appointment next week.  Hypertension: Holding calcium  channel blocker   Symptomatic anemia recently admitted received blood transfusion has  follow-up with GI and oncology for further workup. F/u fecal occult testing F/u H&H closely Iron panel done on 7/30 showed iron 49, TIBC 182, saturation of 27 and Ferritin of 494. She will be transfused one unit of PRBC today as her Hb is 6.4 F/u CBC in am.  Breast cancer: on tamoxifen .  Hyperlipidemia on statins.  Disposition: Lives at home with her husband.    DVT prophylaxis: Place and maintain sequential compression device Start: 10/19/23 0542     Code Status: Full Code Family Communication:  Husband and daughter at the bedside. Status is: Observation The patient remains OBS appropriate and will d/c before 2 midnights.    Subjective:  She feels much better today. Husband and daughter are present at the bedside. We spoke about possibly transfusing her with PRBC if her Hb is less than 7. There is no evidence of active bleeding. We spoke about following up with GI and Hematology as an outpatient.   Examination:  General exam: Appears calm and comfortable, C-spine collar in place  Respiratory system: Clear to auscultation. Respiratory effort normal. Cardiovascular system: S1 & S2 heard, RRR. No JVD, murmurs, rubs, gallops or clicks. No pedal edema. Gastrointestinal system: Abdomen is nondistended, soft and nontender. No organomegaly or masses felt. Normal bowel sounds heard. Central nervous system: Alert and oriented. No focal neurological deficits. Extremities: Symmetric 5 x 5 power. Skin: No rashes, lesions or ulcers Psychiatry: Judgement and insight appear normal. Mood & affect appropriate.       Diet Orders (From admission, onward)     Start     Ordered   10/19/23 0416  Diet Heart Room service appropriate? Yes;  Fluid consistency: Thin  Diet effective now       Question Answer Comment  Room service appropriate? Yes   Fluid consistency: Thin      10/19/23 0415            Objective: Vitals:   10/20/23 2312 10/21/23 0410 10/21/23 0707 10/21/23 1117  BP:  (!) 129/55 (!) 130/59 (!) 140/61 107/78  Pulse: 91 85 93 99  Resp: 18 16 20 20   Temp: 97.7 F (36.5 C) 99.6 F (37.6 C) 98.2 F (36.8 C) 98.3 F (36.8 C)  TempSrc: Oral Axillary Oral Oral  SpO2: 94% 97%  100%  Weight:      Height:       No intake or output data in the 24 hours ending 10/21/23 1357  Filed Weights   10/18/23 1903  Weight: 53.5 kg    Scheduled Meds:  sodium chloride    Intravenous Once   atorvastatin   20 mg Oral QHS   cefadroxil   500 mg Oral Daily   fluticasone   1 spray Each Nare Daily   leptospermum manuka honey  1 Application Topical Daily   magnesium  oxide  200 mg Oral Daily   multivitamin  1 tablet Oral Daily   pantoprazole   40 mg Oral Daily   pregabalin   75 mg Oral Daily   tamoxifen   20 mg Oral Daily   Continuous Infusions:  Nutritional status     Body mass index is 21.58 kg/m.  Data Reviewed:   CBC: Recent Labs  Lab 10/18/23 1905 10/18/23 2059 10/19/23 0900 10/21/23 1218  WBC 8.2  --  6.3 3.8*  NEUTROABS  --   --  3.9 1.9  HGB 7.8* 7.1* 7.4* 6.5*  HCT 23.8* 21.0* 22.5* 20.1*  MCV 108.2*  --  108.7* 111.0*  PLT 181  --  166 136*   Basic Metabolic Panel: Recent Labs  Lab 10/18/23 1905 10/18/23 2005 10/18/23 2059 10/19/23 0900 10/20/23 0258  NA 135  --  134* 137 138  K 3.8  --  3.6 3.4* 3.6  CL 97*  --   --  100 103  CO2 24  --   --  25 24  GLUCOSE 131*  --   --  103* 104*  BUN 39*  --   --  30* 22  CREATININE 1.71*  --   --  1.25* 1.31*  CALCIUM  9.7  --   --  8.9 8.5*  MG  --  2.2  --   --   --    GFR: Estimated Creatinine Clearance: 25.3 mL/min (A) (by C-G formula based on SCr of 1.31 mg/dL (H)). Liver Function Tests: Recent Labs  Lab 10/18/23 1905 10/19/23 0900  AST 16 18  ALT <5 8  ALKPHOS 38 32*  BILITOT 0.6 0.7  PROT 7.2 6.2*  ALBUMIN 3.7 2.6*   No results for input(s): LIPASE, AMYLASE in the last 168 hours. Recent Labs  Lab 10/18/23 2005  AMMONIA <13   Coagulation Profile: No results for  input(s): INR, PROTIME in the last 168 hours. Cardiac Enzymes: No results for input(s): CKTOTAL, CKMB, CKMBINDEX, TROPONINI in the last 168 hours. BNP (last 3 results) No results for input(s): PROBNP in the last 8760 hours. HbA1C: No results for input(s): HGBA1C in the last 72 hours. CBG: Recent Labs  Lab 10/18/23 1952  GLUCAP 125*   Lipid Profile: No results for input(s): CHOL, HDL, LDLCALC, TRIG, CHOLHDL, LDLDIRECT in the last 72 hours. Thyroid  Function Tests: Recent Labs  10/19/23 0900  TSH 3.020   Anemia Panel: No results for input(s): VITAMINB12, FOLATE, FERRITIN, TIBC, IRON, RETICCTPCT in the last 72 hours. Sepsis Labs: Recent Labs  Lab 10/18/23 2005 10/19/23 0426  LATICACIDVEN 1.1 1.0    Recent Results (from the past 240 hours)  Culture, blood (routine x 2)     Status: None (Preliminary result)   Collection Time: 10/18/23 12:15 AM   Specimen: BLOOD  Result Value Ref Range Status   Specimen Description   Final    BLOOD RIGHT ANTECUBITAL Performed at [facility], [address] [place], [place] [address]    Special Requests   Final    BOTTLES DRAWN AEROBIC AND ANAEROBIC Blood Culture results may not be optimal due to an inadequate volume of blood received in culture bottles Performed at [facility], [address] [place], [place] [address]    Culture   Final    NO GROWTH 2 DAYS Performed at [facility], [address] [place], [place] [address]    Report Status PENDING  Incomplete  Culture, blood (routine x 2)     Status: None (Preliminary result)   Collection Time: 10/18/23 12:30 AM   Specimen: BLOOD RIGHT HAND  Result Value Ref Range Status   Specimen Description   Final    BLOOD RIGHT HAND Performed at [facility], [address] [place], [place] [address]    Special Requests   Final    BOTTLES DRAWN AEROBIC AND ANAEROBIC Blood Culture results may not be optimal  due to an inadequate volume of blood received in culture bottles Performed at [facility], [address] [place], [place] [address]    Culture   Final    NO GROWTH 2 DAYS Performed at [facility], [address] [place], [place] [address]    Report Status PENDING  Incomplete  Urine Culture     Status: Abnormal   Collection Time: 10/18/23 11:23 PM   Specimen: Urine, Catheterized  Result Value Ref Range Status   Specimen Description   Final    URINE, CATHETERIZED Performed at [facility], [address] [place], [place] [address]    Special Requests   Final    NONE Performed at [facility], [address] [place], [place] [address]    Culture >=100,000 COLONIES/mL KLEBSIELLA PNEUMONIAE (A)  Final   Report Status 10/21/2023 FINAL  Final   Organism ID, Bacteria KLEBSIELLA PNEUMONIAE (A)  Final      Susceptibility   Klebsiella pneumoniae - MIC*    AMPICILLIN >=32 RESISTANT Resistant     CEFAZOLIN (URINE) Value in next row Sensitive      8 SENSITIVEThis is a modified FDA-approved test that has been validated and its performance characteristics determined by the reporting laboratory.  This laboratory is certified under the Clinical Laboratory Improvement Amendments CLIA as qualified to perform high complexity clinical laboratory testing.    CEFEPIME Value in next row Sensitive      8 SENSITIVEThis is a modified FDA-approved test that has been validated and its performance characteristics determined by the reporting laboratory.  This laboratory is certified under the Clinical Laboratory Improvement Amendments CLIA as qualified to perform high complexity clinical laboratory testing.    ERTAPENEM Value in next row Sensitive      8 SENSITIVEThis is a modified FDA-approved test that has been validated and its performance  characteristics determined by the reporting laboratory.  This laboratory is certified under the Clinical Laboratory Improvement  Amendments CLIA as qualified to perform high complexity clinical laboratory testing.    CEFTRIAXONE Value in next row Sensitive      8 SENSITIVEThis is a modified FDA-approved test that has been validated and its performance characteristics determined by the reporting laboratory.  This laboratory is certified under the Clinical Laboratory Improvement Amendments CLIA as qualified to perform high complexity clinical laboratory testing.    CIPROFLOXACIN  Value in next row Sensitive      8 SENSITIVEThis is a modified FDA-approved test that has been validated and its performance characteristics determined by the reporting laboratory.  This laboratory is certified under the Clinical Laboratory Improvement Amendments CLIA as qualified to perform high complexity clinical laboratory testing.    GENTAMICIN Value in next row Sensitive      8 SENSITIVEThis is a modified FDA-approved test that has been validated and its performance characteristics determined by the reporting laboratory.  This laboratory is certified under the Clinical Laboratory Improvement Amendments CLIA as qualified to perform high complexity clinical laboratory testing.    NITROFURANTOIN  Value in next row Intermediate      8 SENSITIVEThis is a modified FDA-approved test that has been validated and its performance characteristics determined by the reporting laboratory.  This laboratory is certified under the Clinical Laboratory Improvement Amendments CLIA as qualified to perform high complexity clinical laboratory testing.    TRIMETH /SULFA  Value in next row Sensitive      8 SENSITIVEThis is a modified FDA-approved test that has been validated and its performance characteristics determined by the reporting laboratory.  This laboratory is certified under the Clinical Laboratory Improvement Amendments CLIA as qualified to perform high complexity clinical laboratory testing.    AMPICILLIN/SULBACTAM Value in next row Sensitive      8 SENSITIVEThis is a  modified FDA-approved test that has been validated and its performance characteristics determined by the reporting laboratory.  This laboratory is certified under the Clinical Laboratory Improvement Amendments CLIA as qualified to perform high complexity clinical laboratory testing.    PIP/TAZO Value in next row Sensitive ug/mL     <=4 SENSITIVEThis is a modified FDA-approved test that has been validated and its performance characteristics determined by the reporting laboratory.  This laboratory is certified under the Clinical Laboratory Improvement Amendments CLIA as qualified to perform high complexity clinical laboratory testing.    MEROPENEM Value in next row Sensitive      <=4 SENSITIVEThis is a modified FDA-approved test that has been validated and its performance characteristics determined by the reporting laboratory.  This laboratory is certified under the Clinical Laboratory Improvement Amendments CLIA as qualified to perform high complexity clinical laboratory testing.    * >=100,000 COLONIES/mL KLEBSIELLA PNEUMONIAE       Radiology Studies: No results found.      LOS: 0 days   Time spent= 41 mins    [name], MD [facility]  If 7PM-7AM, please contact night-coverage  10/21/2023, 1:57 PM

## 2023-10-22 DIAGNOSIS — G9340 Encephalopathy, unspecified: Secondary | ICD-10-CM | POA: Diagnosis not present

## 2023-10-22 LAB — BPAM RBC
Blood Product Expiration Date: 202509182359
ISSUE DATE / TIME: 202508301641
Unit Type and Rh: 5100

## 2023-10-22 LAB — CBC WITH DIFFERENTIAL/PLATELET
Abs Immature Granulocytes: 0.01 K/uL (ref 0.00–0.07)
Basophils Absolute: 0.1 K/uL (ref 0.0–0.1)
Basophils Relative: 1 %
Eosinophils Absolute: 0.2 K/uL (ref 0.0–0.5)
Eosinophils Relative: 6 %
HCT: 26 % — ABNORMAL LOW (ref 36.0–46.0)
Hemoglobin: 8.6 g/dL — ABNORMAL LOW (ref 12.0–15.0)
Immature Granulocytes: 0 %
Lymphocytes Relative: 38 %
Lymphs Abs: 1.3 K/uL (ref 0.7–4.0)
MCH: 34 pg (ref 26.0–34.0)
MCHC: 33.1 g/dL (ref 30.0–36.0)
MCV: 102.8 fL — ABNORMAL HIGH (ref 80.0–100.0)
Monocytes Absolute: 0.5 K/uL (ref 0.1–1.0)
Monocytes Relative: 15 %
Neutro Abs: 1.4 K/uL — ABNORMAL LOW (ref 1.7–7.7)
Neutrophils Relative %: 40 %
Platelets: 113 K/uL — ABNORMAL LOW (ref 150–400)
RBC: 2.53 MIL/uL — ABNORMAL LOW (ref 3.87–5.11)
RDW: 22.6 % — ABNORMAL HIGH (ref 11.5–15.5)
WBC: 3.5 K/uL — ABNORMAL LOW (ref 4.0–10.5)
nRBC: 0 % (ref 0.0–0.2)

## 2023-10-22 LAB — TYPE AND SCREEN
ABO/RH(D): O POS
Antibody Screen: NEGATIVE
Unit division: 0

## 2023-10-22 MED ORDER — CEFADROXIL 500 MG PO CAPS: 500.0000 mg | ORAL_CAPSULE | Freq: Every day | ORAL | 0 refills | Status: DC

## 2023-10-22 MED ORDER — FAMOTIDINE 40 MG PO TABS
20.0000 mg | ORAL_TABLET | Freq: Every day | ORAL | Status: AC | PRN
Start: 2023-10-22 — End: ?

## 2023-10-22 NOTE — Discharge Summary (Signed)
 Physician Discharge Summary   Patient: Heidi Lutz MRN: [id number] DOB: 09-13-1940  Admit date:     10/18/2023  Discharge date: 10/22/23  Discharge Physician: [name]   PCP: Burney Darice CROME, MD   Recommendations at discharge:    Follow up with your PCP In one week Follow up with Dr Mavis on Tuesday, 9/2 Follow up with GI in 2-3 weeks. Call to make an appointment. Follow up with Hematology/Oncology, Dr Odean in 3-4 weeks. Call to make an appointment. Keep Cervical collar in place Avoid sedatives/hypnotic medications.  Discharge Diagnoses: Principal Problem:   Acute encephalopathy Active Problems:   Malignant neoplasm of central portion of left breast in female, estrogen receptor positive (HCC)   LBBB (left bundle branch block)   Mixed dyslipidemia   Essential hypertension   History of UTI   Symptomatic anemia  Acute metabolic encephalopathy,POA:  Resolved now. suspect likely medication related with recent increase in Lyrica , addition of Remeron and being on opioids. Restarted opioids for neck pain on 8/28 Restarted Lyrica  on 8/29 at a lower dose Continue to hold remeron and hydroxyzine   Possible acute uncomplicated UTI secondary to Klebsiella,POA: Continue with cefadroxil     Chronic disease stage IIIA: No acute issues   C1-C2 fracture on collar.  CT C-spine done during this admission showed acute minimally displaced left posterior C1 fracture.   Patient does have known C1-C2 fracture and has been following up with Neurosurgeon Dr Mavis I spoke to Dr Mavis on 8/28 and he said that patient is a poor surgical candidate, recommended conservative management by keeping C-spine collar in place and he will see her as an outpatient on her scheduled appointment on Tuesday, 9/2.   Hypertension: continue home meds   Symptomatic anemia recently admitted received blood transfusion has follow-up with GI and oncology for further workup. F/u fecal occult  testing-Negative Iron panel done on 7/30 showed iron 49, TIBC 182, saturation of 27 and Ferritin of 494. Received one unit of PRBC on 8/30. Hb 8.5 on discharge. Patient was advised to follow up with heme/onc, Dr Odean in a month.   Breast cancer: on tamoxifen .   Hyperlipidemia on statins.   Disposition: Lives at home with her husband.HHS arranged.  Discussed case in length with patient's husband and daughter at the bedside.  Hospital Course:        Consultants: Neurosurgery Procedures performed: None  Disposition: Home health Diet recommendation:  Cardiac diet DISCHARGE MEDICATION: Allergies as of 10/22/2023       Reactions   Fluorouracil Hives   Other Hives, Itching, Swelling, Rash, Other (See Comments)   NO -CILLIN(s)   Clavulanic Acid Other (See Comments)   Clavulanic acid is a medication that can be used in conjunction with amoxicillin to manage and treat bacterial infections, specifically bacteria that are beta-lactamase producers. It is in the beta-lactamase inhibitor class of medications. Reaction undefined, but is allergic if it is paired with any -cillin(s)   Codeine Nausea And Vomiting   Hydrocodone  Nausea And Vomiting   Penicillins Hives, Swelling, Rash, Other (See Comments)   Pt states allergic to all cillin drugs. Joint pain.        Medication List     STOP taking these medications    hydrOXYzine 10 MG tablet Commonly known as: ATARAX   mirtazapine 15 MG tablet Commonly known as: REMERON       TAKE these medications    albuterol  108 (90 Base) MCG/ACT inhaler Commonly known as: VENTOLIN  HFA Inhale 2  puffs into the lungs every 6 (six) hours as needed for wheezing or shortness of breath.   atorvastatin  20 MG tablet Commonly known as: LIPITOR Take 20 mg by mouth at bedtime.   Black Cohosh 540 MG Caps Take 540 mg by mouth daily.   Breztri  Aerosphere 160-9-4.8 MCG/ACT Aero inhaler Generic drug:  budesonide -glycopyrrolate -formoterol  Inhale 2 puffs into the lungs 2 (two) times daily.   CALCIUM  + D3 PO Take 1 tablet by mouth 2 (two) times daily.   cefadroxil  500 MG capsule Commonly known as: DURICEF Take 1 capsule (500 mg total) by mouth daily. Start taking on: October 23, 2023   cetirizine 10 MG tablet Commonly known as: ZYRTEC Take 10 mg by mouth daily.   clobetasol cream 0.05 % Commonly known as: TEMOVATE Apply 1 Application topically See admin instructions. Apply to affected areas 2 times a day   CRANBERRY PO Take 1 capsule by mouth daily.   estradiol  0.1 MG/GM vaginal cream Commonly known as: ESTRACE  Place 1 Applicatorful vaginally at bedtime.   famotidine  40 MG tablet Commonly known as: PEPCID  Take 0.5 tablets (20 mg total) by mouth daily as needed for heartburn or indigestion. What changed: how much to take   fosfomycin 3 g Pack Commonly known as: MONUROL  Take 1 packet by mouth every 10 days What changed:  how much to take how to take this when to take this additional instructions   Glucosamine-Chondroitin Max St Caps Take 1 capsule by mouth daily.   HYDROcodone -acetaminophen  5-325 MG tablet Commonly known as: NORCO/VICODIN Take 1 tablet by mouth at bedtime.   Kerendia  10 MG Tabs Generic drug: Finerenone  Take 10 mg by mouth in the morning.   lidocaine  5 % Commonly known as: LIDODERM  APPLY 2 PATCHES TO SKIN EVERY DAY, REMOVE AND REPLACE PATCH AFTER 12 HRS What changed: See the new instructions.   Magnesium  200 MG Tabs Take 200 mg by mouth daily.   multivitamin Tabs tablet Take 1 tablet by mouth daily.   NIFEdipine  90 MG 24 hr tablet Commonly known as: PROCARDIA  XL/NIFEDICAL-XL Take 90 mg by mouth daily.   pregabalin  50 MG capsule Commonly known as: LYRICA  Take 100 mg by mouth 2 (two) times daily.   PROBIOTIC ADVANCED PO Take 1 capsule by mouth daily.   Prolia 60 MG/ML Sosy injection Generic drug: denosumab Inject 60 mg into the  skin every 6 (six) months.   silver sulfADIAZINE 1 % cream Commonly known as: SILVADENE Apply 1 Application topically daily.   solifenacin  10 MG tablet Commonly known as: VESICARE  Take 1 tablet (10 mg total) by mouth every evening.   tamoxifen  20 MG tablet Commonly known as: NOLVADEX  TAKE 1 TABLET EVERY DAY What changed: when to take this   traMADol  50 MG tablet Commonly known as: ULTRAM  Take 1 tablet (50 mg total) by mouth every 6 (six) hours as needed. What changed: reasons to take this   Tylenol  8 Hour Arthritis Pain 650 MG CR tablet Generic drug: acetaminophen  Take 1,300 mg by mouth daily as needed for pain.        Follow-up Information     [facility] Follow up.   Specialty: Home Health Services Why: HOme health provider for PT and OT they will call you to set up services Contact information: [address] [place] [address] [phone]         Burney Darice CROME, MD. Schedule an appointment as soon as possible for a visit in 1 week(s).  Specialty: Family Medicine Contact information: [address] [place] [address] [place] [address] [phone]         [facility]. Schedule an appointment as soon as possible for a visit in 2 week(s).   Specialty: Gastroenterology Contact information: [address] [place]  [address] [phone]        Odean Potts, MD. Schedule an appointment as soon as possible for a visit in 1 month(s).   Specialty: Hematology and Oncology Contact information: [address] [place] [address] [phone]                Discharge Exam: Fredricka Weights   10/18/23 1903  Weight: 53.5 kg   General exam: Appears calm and comfortable, C-spine collar in place  Respiratory system: Clear to auscultation. Respiratory effort normal. Cardiovascular system: S1 & S2 heard, RRR. No JVD, murmurs, rubs, gallops or clicks. No pedal  edema. Gastrointestinal system: Abdomen is nondistended, soft and nontender. No organomegaly or masses felt. Normal bowel sounds heard. Central nervous system: Alert and oriented. No focal neurological deficits. Extremities: Symmetric 5 x 5 power. Skin: No rashes, lesions or ulcers Psychiatry: Judgement and insight appear normal. Mood & affect appropriate.    Condition at discharge: good  The results of significant diagnostics from this hospitalization (including imaging, microbiology, ancillary and laboratory) are listed below for reference.   Imaging Studies: CT Head Wo Contrast Result Date: 10/18/2023 CLINICAL DATA:  Mental status change, unknown cause 83 y/o female with AMS. Pt has had multiple falls with fractures,pain has increased in left side of head since Friday. Family reports increased AMS since med changes last week. EXAM: CT HEAD WITHOUT CONTRAST CT CERVICAL SPINE WITHOUT CONTRAST TECHNIQUE: Multidetector CT imaging of the head and cervical spine was performed following the standard protocol without intravenous contrast. Multiplanar CT image reconstructions of the cervical spine were also generated. RADIATION DOSE REDUCTION: This exam was performed according to the departmental dose-optimization program which includes automated exposure control, adjustment of the mA and/or kV according to patient size and/or use of iterative reconstruction technique. COMPARISON:  CT head FINDINGS: CT HEAD FINDINGS Brain: Patchy and confluent areas of decreased attenuation are noted throughout the deep and periventricular white matter of the cerebral hemispheres bilaterally, compatible with chronic microvascular ischemic disease. No evidence of large-territorial acute infarction. No parenchymal hemorrhage. No mass lesion. No extra-axial collection. No mass effect or midline shift. No hydrocephalus. Basilar cisterns are patent. Vascular: No hyperdense vessel. Skull: No acute fracture or focal lesion.  Sinuses/Orbits: Paranasal sinuses and mastoid air cells are clear. Bilateral lens replacement. Otherwise the orbits are unremarkable. Other: None. CT CERVICAL SPINE FINDINGS Alignment: Stable grade 1 anterolisthesis of C3 on C4 and C4 on C5. Stable mild retrolisthesis of C5 on C6. Stable grade 1 anterolisthesis of C7 on T1. 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 Interval slight increased distraction (3 mm instead of 1 mm) with no definite periosteal reaction. Redemonstration of subacute chronic right C1 transverse foramina fracture. Acute minimally displaced left posterior arch C1 fracture. Improved alignment of a subacute to chronic type 2 dens fracture with 1 mm posterior displacement sepsis (from 3 mm). Multilevel severe degenerative changes of the spine. Associated multilevel severe osseous neural foraminal stenosis. No aggressive appearing focal osseous lesion or focal pathologic process. Soft tissues and spinal canal: No prevertebral fluid or swelling. No visible canal hematoma. Upper chest: Unremarkable. Other: Atherosclerotic plaque of the carotid arteries within the  neck. IMPRESSION: 1. No acute intracranial abnormality. 2. Acute minimally displaced left posterior arch C1 fracture. 3. 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 Correlate with point tenderness to palpation to evaluate for an acute component. 4. Improved alignment of a subacute to chronic type 2 dens fracture with 1 mm posterior displacement sepsis (from 3 mm). Electronically Signed   By: Morgane  Naveau M.D.   On: 10/18/2023 20:56   CT CERVICAL SPINE WO CONTRAST Result Date: 10/18/2023 CLINICAL DATA:  Mental status change, unknown cause 83 y/o female with AMS. Pt has had multiple falls with fractures,pain has increased in left side of head since Friday. Family reports increased AMS since med changes last week. EXAM: CT HEAD WITHOUT CONTRAST CT CERVICAL SPINE  WITHOUT CONTRAST TECHNIQUE: Multidetector CT imaging of the head and cervical spine was performed following the standard protocol without intravenous contrast. Multiplanar CT image reconstructions of the cervical spine were also generated. RADIATION DOSE REDUCTION: This exam was performed according to the departmental dose-optimization program which includes automated exposure control, adjustment of the mA and/or kV according to patient size and/or use of iterative reconstruction technique. COMPARISON:  CT head FINDINGS: CT HEAD FINDINGS Brain: Patchy and confluent areas of decreased attenuation are noted throughout the deep and periventricular white matter of the cerebral hemispheres bilaterally, compatible with chronic microvascular ischemic disease. No evidence of large-territorial acute infarction. No parenchymal hemorrhage. No mass lesion. No extra-axial collection. No mass effect or midline shift. No hydrocephalus. Basilar cisterns are patent. Vascular: No hyperdense vessel. Skull: No acute fracture or focal lesion. Sinuses/Orbits: Paranasal sinuses and mastoid air cells are clear. Bilateral lens replacement. Otherwise the orbits are unremarkable. Other: None. CT CERVICAL SPINE FINDINGS Alignment: Stable grade 1 anterolisthesis of C3 on C4 and C4 on C5. Stable mild retrolisthesis of C5 on C6. Stable grade 1 anterolisthesis of C7 on T1. 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 Interval slight increased distraction (3 mm instead of 1 mm) with no definite periosteal reaction. Redemonstration of subacute chronic right C1 transverse foramina fracture. Acute minimally displaced left posterior arch C1 fracture. Improved alignment of a subacute to chronic type 2 dens fracture with 1 mm posterior displacement sepsis (from 3 mm). Multilevel severe degenerative changes of the spine. Associated multilevel severe osseous neural foraminal stenosis. No aggressive appearing  focal osseous lesion or focal pathologic process. Soft tissues and spinal canal: No prevertebral fluid or swelling. No visible canal hematoma. Upper chest: Unremarkable. Other: Atherosclerotic plaque of the carotid arteries within the neck. IMPRESSION: 1. No acute intracranial abnormality. 2. Acute minimally displaced left posterior arch C1 fracture. 3. 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 Correlate with point tenderness to palpation to evaluate for an acute component. 4. Improved alignment of a subacute to chronic type 2 dens fracture with 1 mm posterior displacement sepsis (from 3 mm). Electronically Signed   By: Morgane  Naveau M.D.   On: 10/18/2023 20:56   DG Chest Port 1 View Result Date: 10/18/2023 CLINICAL DATA:  Hypoxia. Multiple falls with fractures. Increasing altered mental status. EXAM: PORTABLE CHEST 1 VIEW COMPARISON:  CT chest abdomen and pelvis 09/22/2023 FINDINGS: Shallow inspiration. Heart size and pulmonary vascularity are normal for technique. Infiltration or atelectasis suggested behind the heart in the left base. No pleural effusion or pneumothorax. Calcified and tortuous aorta. Degenerative changes in the spine and right shoulder. Postoperative left shoulder arthroplasty. IMPRESSION: Shallow inspiration. Infiltration or atelectasis in the left lung base  behind the heart. Electronically Signed   By: Elsie Gravely M.D.   On: 10/18/2023 20:35   CT CHEST ABDOMEN PELVIS W CONTRAST Result Date: 09/22/2023 CLINICAL DATA:  Metastatic disease evaluation Pt reports having a hgb of 7.2 per her provider. Pt had a fall a week ago and has injuries to her face and neck. EXAM: CT CHEST, ABDOMEN, AND PELVIS WITH CONTRAST TECHNIQUE: Multidetector CT imaging of the chest, abdomen and pelvis was performed following the standard protocol during bolus administration of intravenous contrast. RADIATION DOSE REDUCTION: This exam was performed according to the departmental  dose-optimization program which includes automated exposure control, adjustment of the mA and/or kV according to patient size and/or use of iterative reconstruction technique. CONTRAST:  80mL OMNIPAQUE  IOHEXOL  300 MG/ML  SOLN COMPARISON:  CT chest 05/04/2018 FINDINGS: CHEST: Cardiovascular: No aortic injury. The thoracic aorta is normal in caliber. The heart is normal in size. No significant pericardial effusion. Severe atherosclerotic plaque. Aortic valve leaflet calcification. Four-vessel coronary artery calcification. Mediastinum/Nodes: No pneumomediastinum. No mediastinal hematoma. The esophagus is unremarkable. The thyroid  is unremarkable. The central airways are patent. No mediastinal, hilar, or axillary lymphadenopathy. Lungs/Pleura: No focal consolidation. No pulmonary nodule. No pulmonary mass. No pulmonary contusion or laceration. No pneumatocele formation. No pleural effusion. No pneumothorax. No hemothorax. Musculoskeletal/Chest wall: No chest wall mass. Left breast surgical changes with chronic 4 x 1.7 cm fluid density lesion (2:38). No acute rib or sternal fracture. Old nonunionized healed left rib fracture. No spinal fracture. Total reverse left shoulder arthroplasty. Severe degenerative changes of the right shoulder. Fluid within the right shoulder bursa. ABDOMEN / PELVIS: Hepatobiliary: Not enlarged. Nonspecific several subcentimeter hypodensities. No laceration or subcapsular hematoma. The gallbladder is otherwise unremarkable with no radio-opaque gallstones. No biliary ductal dilatation. Pancreas: Normal pancreatic contour. No main pancreatic duct dilatation. Spleen: Not enlarged. No focal lesion. No laceration, subcapsular hematoma, or vascular injury. Adrenals/Urinary Tract: No nodularity bilaterally. Bilateral kidneys enhance symmetrically. Bilateral renal cortical scarring. No hydronephrosis. No contusion, laceration, or subcapsular hematoma. No injury to the vascular structures or collecting  systems. No hydroureter. The urinary bladder is unremarkable. Stomach/Bowel: No small or large bowel wall thickening or dilatation. Colonic diverticulosis. The appendix is not definitely identified with no inflammatory changes in the right lower quadrant to suggest acute appendicitis. Vasculature/Lymphatics: Severe atherosclerotic plaque. No abdominal aorta or iliac aneurysm. No active contrast extravasation or pseudoaneurysm. No abdominal, pelvic, inguinal lymphadenopathy. Reproductive: Status post hysterectomy.  No adnexal mass. Other: No simple free fluid ascites. No pneumoperitoneum. No hemoperitoneum. No mesenteric hematoma identified. No organized fluid collection. Musculoskeletal: No significant soft tissue hematoma. Seventy fat containing umbilical hernia. No acute pelvic fracture. No spinal fracture. Multilevel severe degenerative changes of the spine with intervertebral disc space vacuum phenomenon. Old superior endplate L2 compression fracture. Other ports and devices: None. IMPRESSION: 1. No acute intrathoracic, intra-abdominal, intrapelvic traumatic injury. 2. No acute fracture or traumatic malalignment of the thoracic or lumbar spine. 3. Other imaging findings of potential clinical significance: No findings of malignancy or metastases. Nonspecific several subcentimeter hepatic hypodensities. Colonic diverticulosis with no acute diverticulitis. Severe right shoulder degenerative changes with likely bursitis. Aortic Atherosclerosis (ICD10-I70.0) -severe, including coronary artery and aortic valve leaflet (correlate for aortic stenosis). Leftbreast surgical changes with chronic 4 x 1.7 cm fluid density lesion Electronically Signed   By: Morgane  Naveau M.D.   On: 09/22/2023 19:26    Microbiology: Results for orders placed or performed during the hospital encounter of 10/18/23  Culture, blood (routine x  2)     Status: None (Preliminary result)   Collection Time: 10/18/23 12:15 AM   Specimen: BLOOD   Result Value Ref Range Status   Specimen Description   Final    BLOOD RIGHT ANTECUBITAL Performed at [facility], [address] [place], [place] [address]    Special Requests   Final    BOTTLES DRAWN AEROBIC AND ANAEROBIC Blood Culture results may not be optimal due to an inadequate volume of blood received in culture bottles Performed at [facility], [address] [place], [place] [address]    Culture   Final    NO GROWTH 3 DAYS Performed at [facility], [address], [place], [place] [address]    Report Status PENDING  Incomplete  Culture, blood (routine x 2)     Status: None (Preliminary result)   Collection Time: 10/18/23 12:30 AM   Specimen: BLOOD RIGHT HAND  Result Value Ref Range Status   Specimen Description   Final    BLOOD RIGHT HAND Performed at [facility], [address] [place], [place] [address]    Special Requests   Final    BOTTLES DRAWN AEROBIC AND ANAEROBIC Blood Culture results may not be optimal due to an inadequate volume of blood received in culture bottles Performed at [facility], [address] [place], [place] [address]    Culture   Final    NO GROWTH 3 DAYS Performed at [facility], [address], [place], [place] [address]    Report Status PENDING  Incomplete  Urine Culture     Status: Abnormal   Collection Time: 10/18/23 11:23 PM   Specimen: Urine, Catheterized  Result Value Ref Range Status   Specimen Description   Final    URINE, CATHETERIZED Performed at [facility], [address] [place], [place] [address]    Special Requests   Final    NONE Performed at [facility], [address] [place], [place] [address]    Culture >=100,000 COLONIES/mL KLEBSIELLA PNEUMONIAE (A)  Final   Report Status 10/21/2023 FINAL  Final   Organism ID, Bacteria KLEBSIELLA PNEUMONIAE (A)  Final      Susceptibility   Klebsiella pneumoniae - MIC*    AMPICILLIN >=32  RESISTANT Resistant     CEFAZOLIN (URINE) Value in next row Sensitive      8 SENSITIVEThis is a modified FDA-approved test that has been validated and its performance characteristics determined by the reporting laboratory.  This laboratory is certified under the Clinical Laboratory Improvement Amendments CLIA as qualified to perform high complexity clinical laboratory testing.    CEFEPIME Value in next row Sensitive      8 SENSITIVEThis is a modified FDA-approved test that has been validated and its performance characteristics determined by the reporting laboratory.  This laboratory is certified under the Clinical Laboratory Improvement Amendments CLIA as qualified to perform high complexity clinical laboratory testing.    ERTAPENEM Value in next row Sensitive      8 SENSITIVEThis is a modified FDA-approved test that has been validated and its performance characteristics determined by the reporting laboratory.  This laboratory is certified under the Clinical Laboratory Improvement Amendments CLIA as qualified to perform high complexity clinical laboratory testing.    CEFTRIAXONE Value in next row Sensitive      8 SENSITIVEThis is a modified FDA-approved test that has been validated  and its performance characteristics determined by the reporting laboratory.  This laboratory is certified under the Clinical Laboratory Improvement Amendments CLIA as qualified to perform high complexity clinical laboratory testing.    CIPROFLOXACIN  Value in next row Sensitive      8 SENSITIVEThis is a modified FDA-approved test that has been validated and its performance characteristics determined by the reporting laboratory.  This laboratory is certified under the Clinical Laboratory Improvement Amendments CLIA as qualified to perform high complexity clinical laboratory testing.    GENTAMICIN Value in next row Sensitive      8 SENSITIVEThis is a modified FDA-approved test that has been validated and its performance  characteristics determined by the reporting laboratory.  This laboratory is certified under the Clinical Laboratory Improvement Amendments CLIA as qualified to perform high complexity clinical laboratory testing.    NITROFURANTOIN  Value in next row Intermediate      8 SENSITIVEThis is a modified FDA-approved test that has been validated and its performance characteristics determined by the reporting laboratory.  This laboratory is certified under the Clinical Laboratory Improvement Amendments CLIA as qualified to perform high complexity clinical laboratory testing.    TRIMETH /SULFA  Value in next row Sensitive      8 SENSITIVEThis is a modified FDA-approved test that has been validated and its performance characteristics determined by the reporting laboratory.  This laboratory is certified under the Clinical Laboratory Improvement Amendments CLIA as qualified to perform high complexity clinical laboratory testing.    AMPICILLIN/SULBACTAM Value in next row Sensitive      8 SENSITIVEThis is a modified FDA-approved test that has been validated and its performance characteristics determined by the reporting laboratory.  This laboratory is certified under the Clinical Laboratory Improvement Amendments CLIA as qualified to perform high complexity clinical laboratory testing.    PIP/TAZO Value in next row Sensitive ug/mL     <=4 SENSITIVEThis is a modified FDA-approved test that has been validated and its performance characteristics determined by the reporting laboratory.  This laboratory is certified under the Clinical Laboratory Improvement Amendments CLIA as qualified to perform high complexity clinical laboratory testing.    MEROPENEM Value in next row Sensitive      <=4 SENSITIVEThis is a modified FDA-approved test that has been validated and its performance characteristics determined by the reporting laboratory.  This laboratory is certified under the Clinical Laboratory Improvement Amendments CLIA as  qualified to perform high complexity clinical laboratory testing.    * >=100,000 COLONIES/mL KLEBSIELLA PNEUMONIAE    Labs: CBC: Recent Labs  Lab 10/18/23 1905 10/18/23 2059 10/19/23 0900 10/21/23 1218 10/22/23 0643  WBC 8.2  --  6.3 3.8* 3.5*  NEUTROABS  --   --  3.9 1.9 1.4*  HGB 7.8* 7.1* 7.4* 6.5* 8.6*  HCT 23.8* 21.0* 22.5* 20.1* 26.0*  MCV 108.2*  --  108.7* 111.0* 102.8*  PLT 181  --  166 136* 113*   Basic Metabolic Panel: Recent Labs  Lab 10/18/23 1905 10/18/23 2005 10/18/23 2059 10/19/23 0900 10/20/23 0258  NA 135  --  134* 137 138  K 3.8  --  3.6 3.4* 3.6  CL 97*  --   --  100 103  CO2 24  --   --  25 24  GLUCOSE 131*  --   --  103* 104*  BUN 39*  --   --  30* 22  CREATININE 1.71*  --   --  1.25* 1.31*  CALCIUM  9.7  --   --  8.9 8.5*  MG  --  2.2  --   --   --    Liver Function Tests: Recent Labs  Lab 10/18/23 1905 10/19/23 0900  AST 16 18  ALT <5 8  ALKPHOS 38 32*  BILITOT 0.6 0.7  PROT 7.2 6.2*  ALBUMIN 3.7 2.6*   CBG: Recent Labs  Lab 10/18/23 1952  GLUCAP 125*    Discharge time spent: 43 minutes.  Signed: [name], MD [facility] 10/22/2023

## 2023-10-22 NOTE — TOC Transition Note (Signed)
 Transition of Care [facility]) - Discharge Note   Patient Details  Name: Heidi Lutz MRN: [id number] Date of Birth: 1940/03/22  Transition of Care [facility] CM/SW Contact:  Marval Gell, RN Phone Number: 10/22/2023, 10:49 AM   Clinical Narrative:    Notified [facility] that patient will Dc today      Barriers to Discharge: Continued Medical Work up   Patient Goals and CMS Choice   CMS Medicare.gov Compare Post Acute Care list provided to:: Patient Choice offered to / list presented to : Patient      Discharge Placement                       Discharge Plan and Services Additional resources added to the After Visit Summary for     Discharge Planning Services: CM Consult Post Acute Care Choice: Home Health                    HH Arranged: PT, OT [facility] Agency: [facility] Date [facility] Agency Contacted: 10/20/23 Time HH Agency Contacted: 1315 Representative spoke with at [facility] Agency: [place]  Social Drivers of Health (SDOH) Interventions SDOH Screenings   Food Insecurity: No Food Insecurity (10/19/2023)  Housing: Low Risk  (10/19/2023)  Transportation Needs: No Transportation Needs (10/19/2023)  Utilities: Not At Risk (10/19/2023)  Depression (PHQ2-9): Low Risk  (03/20/2020)  Financial Resource Strain: Low Risk  (05/03/2021)   Received from [facility]  Physical Activity: Insufficiently Active (05/03/2021)   Received from [facility]  Social Connections: Unknown (10/19/2023)  Stress: No Stress Concern Present (05/03/2021)   Received from [facility]  Tobacco Use: Medium Risk (10/19/2023)     Readmission Risk Interventions     No data to display

## 2023-10-24 ENCOUNTER — Other Ambulatory Visit: Payer: Self-pay | Admitting: *Deleted

## 2023-10-24 DIAGNOSIS — C50112 Malignant neoplasm of central portion of left female breast: Secondary | ICD-10-CM

## 2023-10-24 LAB — CULTURE, BLOOD (ROUTINE X 2)
Culture: NO GROWTH
Culture: NO GROWTH

## 2023-10-25 ENCOUNTER — Other Ambulatory Visit: Payer: Self-pay | Admitting: Neurosurgery

## 2023-10-27 NOTE — Progress Notes (Signed)
 Surgical Instructions   Your procedure is scheduled on November 02, 2023. Report to [facility] Main Entrance A at 5:30 A.M., then check in with the Admitting office. Any questions or running late day of surgery: call [phone]  Questions prior to your surgery date: call [phone], Monday-Friday, 8am-4pm. If you experience any cold or flu symptoms such as cough, fever, chills, shortness of breath, etc. between now and your scheduled surgery, please notify us  at the above number.     Remember:  Do not eat or drink after midnight the night before your surgery    Take these medicines the morning of surgery with A SIP OF WATER  cetirizine (ZYRTEC)  NIFEdipine  (PROCARDIA  XL/ADALAT -  pregabalin  (LYRICA )   May take these medicines IF NEEDED: albuterol  (VENTOLIN  HFA) inhaler  famotidine  (PEPCID )  traMADol  (ULTRAM )  TYLENOL  8 HOUR ARTHRITIS   One week prior to surgery, STOP taking any Aspirin (unless otherwise instructed by your surgeon) Aleve, Naproxen, Ibuprofen , Motrin , Advil , Goody's, BC's, all herbal medications, fish oil, and non-prescription vitamins.                     Do NOT Smoke (Tobacco/Vaping) for 24 hours prior to your procedure.  If you use a CPAP at night, you may bring your mask/headgear for your overnight stay.   You will be asked to remove any contacts, glasses, piercing's, hearing aid's, dentures/partials prior to surgery. Please bring cases for these items if needed.    Patients discharged the day of surgery will not be allowed to drive home, and someone needs to stay with them for 24 hours.  SURGICAL WAITING ROOM VISITATION Patients may have no more than 2 support people in the waiting area - these visitors may rotate.   Pre-op nurse will coordinate an appropriate time for 1 ADULT support person, who may not rotate, to accompany patient in pre-op.  Children under the age of 50 must have an adult with them who is not the patient and must remain in the main  waiting area with an adult.  If the patient needs to stay at the hospital during part of their recovery, the visitor guidelines for inpatient rooms apply.  Please refer to the [facility] website for the visitor guidelines for any additional information.   If you received a COVID test during your pre-op visit  it is requested that you wear a mask when out in public, stay away from anyone that may not be feeling well and notify your surgeon if you develop symptoms. If you have been in contact with anyone that has tested positive in the last 10 days please notify you surgeon.      Pre-operative 5 CHG Bathing Instructions   You can play a key role in reducing the risk of infection after surgery. Your skin needs to be as free of germs as possible. You can reduce the number of germs on your skin by washing with CHG (chlorhexidine  gluconate) soap before surgery. CHG is an antiseptic soap that kills germs and continues to kill germs even after washing.   DO NOT use if you have an allergy to chlorhexidine /CHG or antibacterial soaps. If your skin becomes reddened or irritated, stop using the CHG and notify one of our RNs at [phone].   Please shower with the CHG soap starting 4 days before surgery using the following schedule:     Please keep in mind the following:  DO NOT shave, including legs and underarms, starting the day  of your first shower.   You may shave your face at any point before/day of surgery.  Place clean sheets on your bed the day you start using CHG soap. Use a clean washcloth (not used since being washed) for each shower. DO NOT sleep with pets once you start using the CHG.   CHG Shower Instructions:  Wash your face and private area with normal soap. If you choose to wash your hair, wash first with your normal shampoo.  After you use shampoo/soap, rinse your hair and body thoroughly to remove shampoo/soap residue.  Turn the water OFF and apply about 3 tablespoons (45 ml)  of CHG soap to a CLEAN washcloth.  Apply CHG soap ONLY FROM YOUR NECK DOWN TO YOUR TOES (washing for 3-5 minutes)  DO NOT use CHG soap on face, private areas, open wounds, or sores.  Pay special attention to the area where your surgery is being performed.  If you are having back surgery, having someone wash your back for you may be helpful. Wait 2 minutes after CHG soap is applied, then you may rinse off the CHG soap.  Pat dry with a clean towel  Put on clean clothes/pajamas   If you choose to wear lotion, please use ONLY the CHG-compatible lotions that are listed below.  Additional instructions for the day of surgery: DO NOT APPLY any lotions, deodorants, cologne, or perfumes.   Do not bring valuables to the hospital. [facility] is not responsible for any belongings/valuables. Do not wear nail polish, gel polish, artificial nails, or any other type of covering on natural nails (fingers and toes) Do not wear jewelry or makeup Put on clean/comfortable clothes.  Please brush your teeth.  Ask your nurse before applying any prescription medications to the skin.     CHG Compatible Lotions   Aveeno Moisturizing lotion  Cetaphil Moisturizing Cream  Cetaphil Moisturizing Lotion  Clairol Herbal Essence Moisturizing Lotion, Dry Skin  Clairol Herbal Essence Moisturizing Lotion, Extra Dry Skin  Clairol Herbal Essence Moisturizing Lotion, Normal Skin  Curel Age Defying Therapeutic Moisturizing Lotion with Alpha Hydroxy  Curel Extreme Care Body Lotion  Curel Soothing Hands Moisturizing Hand Lotion  Curel Therapeutic Moisturizing Cream, Fragrance-Free  Curel Therapeutic Moisturizing Lotion, Fragrance-Free  Curel Therapeutic Moisturizing Lotion, Original Formula  Eucerin Daily Replenishing Lotion  Eucerin Dry Skin Therapy Plus Alpha Hydroxy Crme  Eucerin Dry Skin Therapy Plus Alpha Hydroxy Lotion  Eucerin Original Crme  Eucerin Original Lotion  Eucerin Plus Crme Eucerin Plus Lotion   Eucerin TriLipid Replenishing Lotion  Keri Anti-Bacterial Hand Lotion  Keri Deep Conditioning Original Lotion Dry Skin Formula Softly Scented  Keri Deep Conditioning Original Lotion, Fragrance Free Sensitive Skin Formula  Keri Lotion Fast Absorbing Fragrance Free Sensitive Skin Formula  Keri Lotion Fast Absorbing Softly Scented Dry Skin Formula  Keri Original Lotion  Keri Skin Renewal Lotion Keri Silky Smooth Lotion  Keri Silky Smooth Sensitive Skin Lotion  Nivea Body Creamy Conditioning Oil  Nivea Body Extra Enriched Lotion  Nivea Body Original Lotion  Nivea Body Sheer Moisturizing Lotion Nivea Crme  Nivea Skin Firming Lotion  NutraDerm 30 Skin Lotion  NutraDerm Skin Lotion  NutraDerm Therapeutic Skin Cream  NutraDerm Therapeutic Skin Lotion  ProShield Protective Hand Cream  Provon moisturizing lotion  Please read over the following fact sheets that you were given.

## 2023-10-30 ENCOUNTER — Encounter (HOSPITAL_COMMUNITY)
Admission: RE | Admit: 2023-10-30 | Discharge: 2023-10-30 | Disposition: A | Discharge status: Home / Self Care | Source: Ambulatory Visit | Attending: Neurosurgery | Admitting: Neurosurgery

## 2023-10-30 ENCOUNTER — Encounter (HOSPITAL_COMMUNITY): Payer: Self-pay

## 2023-10-30 ENCOUNTER — Other Ambulatory Visit: Payer: Self-pay

## 2023-10-30 VITALS — BP 116/48 | HR 117 | Temp 98.2°F | Resp 18 | Ht 62.0 in | Wt 118.0 lb

## 2023-10-30 DIAGNOSIS — Z01818 Encounter for other preprocedural examination: Secondary | ICD-10-CM

## 2023-10-30 DIAGNOSIS — I10 Essential (primary) hypertension: Secondary | ICD-10-CM | POA: Insufficient documentation

## 2023-10-30 DIAGNOSIS — I351 Nonrheumatic aortic (valve) insufficiency: Secondary | ICD-10-CM | POA: Insufficient documentation

## 2023-10-30 DIAGNOSIS — D696 Thrombocytopenia, unspecified: Secondary | ICD-10-CM | POA: Insufficient documentation

## 2023-10-30 DIAGNOSIS — Z01812 Encounter for preprocedural laboratory examination: Secondary | ICD-10-CM | POA: Insufficient documentation

## 2023-10-30 DIAGNOSIS — D649 Anemia, unspecified: Secondary | ICD-10-CM | POA: Insufficient documentation

## 2023-10-30 DIAGNOSIS — I447 Left bundle-branch block, unspecified: Secondary | ICD-10-CM | POA: Insufficient documentation

## 2023-10-30 DIAGNOSIS — E785 Hyperlipidemia, unspecified: Secondary | ICD-10-CM | POA: Insufficient documentation

## 2023-10-30 HISTORY — DX: Anemia, unspecified: D64.9

## 2023-10-30 LAB — TYPE AND SCREEN
ABO/RH(D): O POS
Antibody Screen: NEGATIVE

## 2023-10-30 LAB — CBC
HCT: 30.3 % — ABNORMAL LOW (ref 36.0–46.0)
Hemoglobin: 9.9 g/dL — ABNORMAL LOW (ref 12.0–15.0)
MCH: 34.3 pg — ABNORMAL HIGH (ref 26.0–34.0)
MCHC: 32.7 g/dL (ref 30.0–36.0)
MCV: 104.8 fL — ABNORMAL HIGH (ref 80.0–100.0)
Platelets: 135 K/uL — ABNORMAL LOW (ref 150–400)
RBC: 2.89 MIL/uL — ABNORMAL LOW (ref 3.87–5.11)
RDW: 19.5 % — ABNORMAL HIGH (ref 11.5–15.5)
WBC: 6.1 K/uL (ref 4.0–10.5)
nRBC: 0 % (ref 0.0–0.2)

## 2023-10-30 LAB — SURGICAL PCR SCREEN
MRSA, PCR: POSITIVE — AB
Staphylococcus aureus: POSITIVE — AB

## 2023-10-30 NOTE — Progress Notes (Signed)
 Lab called Surgical PCR +MRSA. Will need Betadine DOS. Vancomycin  already ordered per MD.

## 2023-10-30 NOTE — Progress Notes (Signed)
 PCP - Darice Richter,MD Cardiologist - Jennifer Revankar,MD  PPM/ICD - denies Device Orders -  Rep Notified -   Chest x-ray - na EKG - 10/18/23 Stress Test - 03/24/17 ECHO - 05/08/19 Cardiac Cath - denies  Sleep Study - denies CPAP -   Fasting Blood Sugar - na Checks Blood Sugar _____ times a day  Last dose of GLP1 agonist- na GLP1 instructions:   Blood Thinner Instructions:na Aspirin Instructions:na  ERAS Protcol -no PRE-SURGERY Ensure or G2-   COVID TEST- na   Anesthesia review: yes -hospitalized 827-8/31 with acute encephalopathy,UTI; HGB 9.9 on CBC at PAT today  Patient denies shortness of breath, fever, cough and chest pain at PAT appointment   All instructions explained to the patient, with a verbal understanding of the material. Patient agrees to go over the instructions while at home for a better understanding.  The opportunity to ask questions was provided.

## 2023-10-30 NOTE — Progress Notes (Signed)
 Surgical Instructions   Your procedure is scheduled on November 02, 2023. Report to [facility] Main Entrance A at 5:30 A.M., then check in with the Admitting office. Any questions or running late day of surgery: call [phone]  Questions prior to your surgery date: call [phone], Monday-Friday, 8am-4pm. If you experience any cold or flu symptoms such as cough, fever, chills, shortness of breath, etc. between now and your scheduled surgery, please notify us  at the above number.     Remember:  Do not eat or drink after midnight the night before your surgery    Take these medicines the morning of surgery with A SIP OF WATER  Claritin  kerendia  NIFEdipine  (PROCARDIA  XL/ADALAT -  Tamoxifen   May take these medicines IF NEEDED: albuterol  (VENTOLIN  HFA) inhaler -please bring inhaler to the hospital famotidine  (PEPCID )  traMADol  (ULTRAM )  TYLENOL  8 HOUR ARTHRITIS  HYDROcodone -acetaminophen  (NORCO/VICODIN)   One week prior to surgery, STOP taking any Aspirin (unless otherwise instructed by your surgeon) Aleve, Naproxen, Ibuprofen , Motrin , Advil , Goody's, BC's, all herbal medications, fish oil, and non-prescription vitamins.                     Do NOT Smoke (Tobacco/Vaping) for 24 hours prior to your procedure.  If you use a CPAP at night, you may bring your mask/headgear for your overnight stay.   You will be asked to remove any contacts, glasses, piercing's, hearing aid's, dentures/partials prior to surgery. Please bring cases for these items if needed.    Patients discharged the day of surgery will not be allowed to drive home, and someone needs to stay with them for 24 hours.  SURGICAL WAITING ROOM VISITATION Patients may have no more than 2 support people in the waiting area - these visitors may rotate.   Pre-op nurse will coordinate an appropriate time for 1 ADULT support person, who may not rotate, to accompany patient in pre-op.  Children under the age of 48 must have  an adult with them who is not the patient and must remain in the main waiting area with an adult.  If the patient needs to stay at the hospital during part of their recovery, the visitor guidelines for inpatient rooms apply.  Please refer to the [facility] website for the visitor guidelines for any additional information.   If you received a COVID test during your pre-op visit  it is requested that you wear a mask when out in public, stay away from anyone that may not be feeling well and notify your surgeon if you develop symptoms. If you have been in contact with anyone that has tested positive in the last 10 days please notify you surgeon.      Pre-operative 5 CHG Bathing Instructions   You can play a key role in reducing the risk of infection after surgery. Your skin needs to be as free of germs as possible. You can reduce the number of germs on your skin by washing with CHG (chlorhexidine  gluconate) soap before surgery. CHG is an antiseptic soap that kills germs and continues to kill germs even after washing.   DO NOT use if you have an allergy to chlorhexidine /CHG or antibacterial soaps. If your skin becomes reddened or irritated, stop using the CHG and notify one of our RNs at [phone].   Please shower with the CHG soap starting 4 days before surgery using the following schedule:     Please keep in mind the following:  DO NOT shave, including legs  and underarms, starting the day of your first shower.   You may shave your face at any point before/day of surgery.  Place clean sheets on your bed the day you start using CHG soap. Use a clean washcloth (not used since being washed) for each shower. DO NOT sleep with pets once you start using the CHG.   CHG Shower Instructions:  Wash your face and private area with normal soap. If you choose to wash your hair, wash first with your normal shampoo.  After you use shampoo/soap, rinse your hair and body thoroughly to remove  shampoo/soap residue.  Turn the water OFF and apply about 3 tablespoons (45 ml) of CHG soap to a CLEAN washcloth.  Apply CHG soap ONLY FROM YOUR NECK DOWN TO YOUR TOES (washing for 3-5 minutes)  DO NOT use CHG soap on face, private areas, open wounds, or sores.  Pay special attention to the area where your surgery is being performed.  If you are having back surgery, having someone wash your back for you may be helpful. Wait 2 minutes after CHG soap is applied, then you may rinse off the CHG soap.  Pat dry with a clean towel  Put on clean clothes/pajamas   If you choose to wear lotion, please use ONLY the CHG-compatible lotions that are listed below.  Additional instructions for the day of surgery: DO NOT APPLY any lotions, deodorants, cologne, or perfumes.   Do not bring valuables to the hospital. [facility] is not responsible for any belongings/valuables. Do not wear nail polish, gel polish, artificial nails, or any other type of covering on natural nails (fingers and toes) Do not wear jewelry or makeup Put on clean/comfortable clothes.  Please brush your teeth.  Ask your nurse before applying any prescription medications to the skin.     CHG Compatible Lotions   Aveeno Moisturizing lotion  Cetaphil Moisturizing Cream  Cetaphil Moisturizing Lotion  Clairol Herbal Essence Moisturizing Lotion, Dry Skin  Clairol Herbal Essence Moisturizing Lotion, Extra Dry Skin  Clairol Herbal Essence Moisturizing Lotion, Normal Skin  Curel Age Defying Therapeutic Moisturizing Lotion with Alpha Hydroxy  Curel Extreme Care Body Lotion  Curel Soothing Hands Moisturizing Hand Lotion  Curel Therapeutic Moisturizing Cream, Fragrance-Free  Curel Therapeutic Moisturizing Lotion, Fragrance-Free  Curel Therapeutic Moisturizing Lotion, Original Formula  Eucerin Daily Replenishing Lotion  Eucerin Dry Skin Therapy Plus Alpha Hydroxy Crme  Eucerin Dry Skin Therapy Plus Alpha Hydroxy Lotion  Eucerin  Original Crme  Eucerin Original Lotion  Eucerin Plus Crme Eucerin Plus Lotion  Eucerin TriLipid Replenishing Lotion  Keri Anti-Bacterial Hand Lotion  Keri Deep Conditioning Original Lotion Dry Skin Formula Softly Scented  Keri Deep Conditioning Original Lotion, Fragrance Free Sensitive Skin Formula  Keri Lotion Fast Absorbing Fragrance Free Sensitive Skin Formula  Keri Lotion Fast Absorbing Softly Scented Dry Skin Formula  Keri Original Lotion  Keri Skin Renewal Lotion Keri Silky Smooth Lotion  Keri Silky Smooth Sensitive Skin Lotion  Nivea Body Creamy Conditioning Oil  Nivea Body Extra Enriched Lotion  Nivea Body Original Lotion  Nivea Body Sheer Moisturizing Lotion Nivea Crme  Nivea Skin Firming Lotion  NutraDerm 30 Skin Lotion  NutraDerm Skin Lotion  NutraDerm Therapeutic Skin Cream  NutraDerm Therapeutic Skin Lotion  ProShield Protective Hand Cream  Provon moisturizing lotion  Please read over the following fact sheets that you were given.

## 2023-10-31 ENCOUNTER — Encounter: Payer: Self-pay | Admitting: Internal Medicine

## 2023-10-31 NOTE — Anesthesia Preprocedure Evaluation (Signed)
 Anesthesia Evaluation  Patient identified by MRN, date of birth, ID band Patient awake    Reviewed: Allergy & Precautions, NPO status , Patient's Chart, lab work & pertinent test results, reviewed documented beta blocker date and time   History of Anesthesia Complications (+) PONV and history of anesthetic complications  Airway Mallampati: IV   Neck ROM: Limited  Mouth opening: Limited Mouth Opening  Dental no notable dental hx. (+) Teeth Intact, Caps, Dental Advisory Given   Pulmonary former smoker   Pulmonary exam normal breath sounds clear to auscultation       Cardiovascular hypertension, Pt. on medications Normal cardiovascular exam(-) dysrhythmias  Rhythm:Regular Rate:Normal  EKG NSR, PAC, LBBB pattern  TTE 05/08/2019:  1. Left ventricular ejection fraction, by estimation, is 60 to 65%. The  left ventricle has normal function. The left ventricle has no regional  wall motion abnormalities. Left ventricular diastolic parameters are  consistent with Grade I diastolic  dysfunction (impaired relaxation).   2. The aortic valve is normal in structure. Aortic valve regurgitation is  mild. No aortic stenosis is present.   Myocardial perfusion scan 03/24/17  Probable normal perfusion and soft tissue attenuation (diaphragm), cannot exclude subendocardial scar. No ischemia  Nuclear stress EF: 61%.  Blood pressure demonstrated a hypertensive response to exercise. EKG was nondiagnostic due to baseline changes  This is a low risk study.      Neuro/Psych  PSYCHIATRIC DISORDERS Anxiety Depression     Neuromuscular disease    GI/Hepatic negative GI ROS, Neg liver ROS,,,  Endo/Other  HLD  Renal/GU negative Renal ROS  negative genitourinary   Musculoskeletal  (+) Arthritis , Osteoarthritis,  Anterior displaced Type II dens fracture   Abdominal   Peds  Hematology  (+) Blood dyscrasia, anemia   Anesthesia Other  Findings   Reproductive/Obstetrics                              Anesthesia Physical Anesthesia Plan  ASA: 3  Anesthesia Plan: General   Post-op Pain Management: Dilaudid IV and Ofirmev  IV (intra-op)*   Induction: Intravenous  PONV Risk Score and Plan: 4 or greater and Treatment may vary due to age or medical condition, TIVA, Propofol  infusion and Ondansetron   Airway Management Planned: Oral ETT and Video Laryngoscope Planned  Additional Equipment: None  Intra-op Plan:   Post-operative Plan: Extubation in OR  Informed Consent: I have reviewed the patients History and Physical, chart, labs and discussed the procedure including the risks, benefits and alternatives for the proposed anesthesia with the patient or authorized representative who has indicated his/her understanding and acceptance.     Dental advisory given  Plan Discussed with: CRNA and Anesthesiologist  Anesthesia Plan Comments: (PAT note by Lynwood Hope, PA-C: 83 year old female follows with cardiology for history of hypertension, HLD, LBBB.  Echo 04/2019 showed LVEF 60 to 65%, grade 1 DD, mild aortic regurgitation.  Nuclear stress 03/2017.  Last seen in follow-up by Dr. Edwyna on 06/20/2023, no changes to management at that time, 46-month follow-up recommended.  Recent admission 8/27 through 10/22/2023 for acute encephalopathy felt secondary to medication with recent increase in Lyrica  and addition of Remeron in the setting of already being on opioids for neck pain. She was restarted on opioids 8/28 for control of neck pain, restarted on Lyrica  on 8/29 at lower dose, Remeron and hydroxyzine were held. She was also found to be anemic and received 1 unit PRBC on 8/30 and  her hemoglobin was 8.5 on discharge. CT C-spine done during admission showed acute minimally displaced left posterior C1 fracture.  Patient does have known C1-C2 fracture and has been following up with Neurosurgeon Dr Mavis.  Dr.  Mavis was consulted and at that time recommended conservative management by keeping C-spine collar in place with plan to see her as an outpatient on her scheduled appointment on Tuesday, 9/2.  BMP 10/20/2023 reviewed, creatinine mildly elevated 1.31, otherwise unremarkable.  CBC 10/30/2023 reviewed, stable anemia with hemoglobin 9.9, mild thrombocytopenia platelets 135, otherwise unremarkable.  EKG 10/18/2023: Sinus rhythm.  Rate 87. Atrial premature complexes.  Left bundle branch block.  Nuclear stress 03/24/2017:  Probable normal perfusion and soft tissue attenuation (diaphragm), cannot exclude subendocardial scar. No ischemia  Nuclear stress EF: 61%.  Blood pressure demonstrated a hypertensive response to exercise. EKG was nondiagnostic due to baseline changes  This is a low risk study.  TTE 05/08/2019:  1. Left ventricular ejection fraction, by estimation, is 60 to 65%. The  left ventricle has normal function. The left ventricle has no regional  wall motion abnormalities. Left ventricular diastolic parameters are  consistent with Grade I diastolic  dysfunction (impaired relaxation).   2. The aortic valve is normal in structure. Aortic valve regurgitation is  mild. No aortic stenosis is present.   )         Anesthesia Quick Evaluation

## 2023-10-31 NOTE — Progress Notes (Signed)
 Voicemail left for Levon Bracket informingher that patient's surgical PCR was positive for MRSA and staph.  Also informed her that pt's HGB is 9.9. Dr. Mavis has been set an [employer] with these lab results.

## 2023-10-31 NOTE — Progress Notes (Signed)
 Anesthesia Chart Review:  83 year old female follows with cardiology for history of hypertension, HLD, LBBB.  Echo 04/2019 showed LVEF 60 to 65%, grade 1 DD, mild aortic regurgitation.  Nuclear stress 03/2017.  Last seen in follow-up by Dr. Edwyna on 06/20/2023, no changes to management at that time, 22-month follow-up recommended.  Recent admission 8/27 through 10/22/2023 for acute encephalopathy felt secondary to medication with recent increase in Lyrica  and addition of Remeron in the setting of already being on opioids for neck pain. She was restarted on opioids 8/28 for control of neck pain, restarted on Lyrica  on 8/29 at lower dose, Remeron and hydroxyzine were held. She was also found to be anemic and received 1 unit PRBC on 8/30 and her hemoglobin was 8.5 on discharge. CT C-spine done during admission showed acute minimally displaced left posterior C1 fracture.  Patient does have known C1-C2 fracture and has been following up with Neurosurgeon Dr Mavis.  Dr. Mavis was consulted and at that time recommended conservative management by keeping C-spine collar in place with plan to see her as an outpatient on her scheduled appointment on Tuesday, 9/2.  BMP 10/20/2023 reviewed, creatinine mildly elevated 1.31, otherwise unremarkable.  CBC 10/30/2023 reviewed, stable anemia with hemoglobin 9.9, mild thrombocytopenia platelets 135, otherwise unremarkable.  EKG 10/18/2023: Sinus rhythm.  Rate 87. Atrial premature complexes.  Left bundle branch block.  Nuclear stress 03/24/2017: Probable normal perfusion and soft tissue attenuation (diaphragm), cannot exclude subendocardial scar. No ischemia Nuclear stress EF: 61%. Blood pressure demonstrated a hypertensive response to exercise. EKG was nondiagnostic due to baseline changes This is a low risk study.  TTE 05/08/2019:  1. Left ventricular ejection fraction, by estimation, is 60 to 65%. The  left ventricle has normal function. The left ventricle has no  regional  wall motion abnormalities. Left ventricular diastolic parameters are  consistent with Grade I diastolic  dysfunction (impaired relaxation).   2. The aortic valve is normal in structure. Aortic valve regurgitation is  mild. No aortic stenosis is present.      Lynwood Geofm RIGGERS [facility] Short Stay Center/Anesthesiology Phone [phone] 10/31/2023 2:40 PM

## 2023-11-02 ENCOUNTER — Inpatient Hospital Stay (HOSPITAL_COMMUNITY): Payer: Self-pay | Admitting: Physician Assistant

## 2023-11-02 ENCOUNTER — Inpatient Hospital Stay (HOSPITAL_COMMUNITY): Payer: Self-pay | Admitting: Anesthesiology

## 2023-11-02 ENCOUNTER — Other Ambulatory Visit: Payer: Self-pay

## 2023-11-02 ENCOUNTER — Inpatient Hospital Stay (HOSPITAL_COMMUNITY)

## 2023-11-02 ENCOUNTER — Encounter (HOSPITAL_COMMUNITY): Payer: Self-pay | Admitting: Neurosurgery

## 2023-11-02 ENCOUNTER — Encounter (HOSPITAL_COMMUNITY)
Admission: RE | Disposition: A | Discharge status: Skilled Nursing Facility | Payer: Self-pay | Source: Home / Self Care | Attending: Neurosurgery

## 2023-11-02 ENCOUNTER — Inpatient Hospital Stay (HOSPITAL_COMMUNITY)
Admission: RE | Admit: 2023-11-02 | Discharge: 2023-11-03 | DRG: 473 | Disposition: A | Discharge status: Skilled Nursing Facility | Attending: Neurosurgery | Admitting: Neurosurgery

## 2023-11-02 DIAGNOSIS — G8929 Other chronic pain: Secondary | ICD-10-CM | POA: Diagnosis present

## 2023-11-02 DIAGNOSIS — Z923 Personal history of irradiation: Secondary | ICD-10-CM

## 2023-11-02 DIAGNOSIS — S12000A Unspecified displaced fracture of first cervical vertebra, initial encounter for closed fracture: Secondary | ICD-10-CM | POA: Diagnosis present

## 2023-11-02 DIAGNOSIS — S12110A Anterior displaced Type II dens fracture, initial encounter for closed fracture: Principal | ICD-10-CM | POA: Diagnosis present

## 2023-11-02 DIAGNOSIS — Z888 Allergy status to other drugs, medicaments and biological substances status: Secondary | ICD-10-CM | POA: Diagnosis not present

## 2023-11-02 DIAGNOSIS — W19XXXA Unspecified fall, initial encounter: Secondary | ICD-10-CM | POA: Diagnosis present

## 2023-11-02 DIAGNOSIS — Z853 Personal history of malignant neoplasm of breast: Secondary | ICD-10-CM | POA: Diagnosis not present

## 2023-11-02 DIAGNOSIS — Z01812 Encounter for preprocedural laboratory examination: Secondary | ICD-10-CM

## 2023-11-02 DIAGNOSIS — Z85820 Personal history of malignant melanoma of skin: Secondary | ICD-10-CM | POA: Diagnosis not present

## 2023-11-02 DIAGNOSIS — S12100A Unspecified displaced fracture of second cervical vertebra, initial encounter for closed fracture: Principal | ICD-10-CM | POA: Diagnosis present

## 2023-11-02 DIAGNOSIS — Z90710 Acquired absence of both cervix and uterus: Secondary | ICD-10-CM

## 2023-11-02 DIAGNOSIS — Z87891 Personal history of nicotine dependence: Secondary | ICD-10-CM

## 2023-11-02 DIAGNOSIS — F418 Other specified anxiety disorders: Secondary | ICD-10-CM | POA: Diagnosis not present

## 2023-11-02 DIAGNOSIS — Z96612 Presence of left artificial shoulder joint: Secondary | ICD-10-CM | POA: Diagnosis present

## 2023-11-02 DIAGNOSIS — L89310 Pressure ulcer of right buttock, unstageable: Secondary | ICD-10-CM | POA: Diagnosis present

## 2023-11-02 DIAGNOSIS — Z79899 Other long term (current) drug therapy: Secondary | ICD-10-CM

## 2023-11-02 DIAGNOSIS — M069 Rheumatoid arthritis, unspecified: Secondary | ICD-10-CM | POA: Diagnosis present

## 2023-11-02 DIAGNOSIS — I10 Essential (primary) hypertension: Secondary | ICD-10-CM | POA: Diagnosis present

## 2023-11-02 DIAGNOSIS — D696 Thrombocytopenia, unspecified: Secondary | ICD-10-CM | POA: Diagnosis present

## 2023-11-02 DIAGNOSIS — I447 Left bundle-branch block, unspecified: Secondary | ICD-10-CM | POA: Diagnosis present

## 2023-11-02 DIAGNOSIS — M810 Age-related osteoporosis without current pathological fracture: Secondary | ICD-10-CM | POA: Diagnosis present

## 2023-11-02 DIAGNOSIS — Z96653 Presence of artificial knee joint, bilateral: Secondary | ICD-10-CM | POA: Diagnosis present

## 2023-11-02 DIAGNOSIS — Z880 Allergy status to penicillin: Secondary | ICD-10-CM | POA: Diagnosis not present

## 2023-11-02 DIAGNOSIS — S12000D Unspecified displaced fracture of first cervical vertebra, subsequent encounter for fracture with routine healing: Secondary | ICD-10-CM

## 2023-11-02 DIAGNOSIS — Z885 Allergy status to narcotic agent status: Secondary | ICD-10-CM | POA: Diagnosis not present

## 2023-11-02 DIAGNOSIS — E782 Mixed hyperlipidemia: Secondary | ICD-10-CM | POA: Diagnosis present

## 2023-11-02 DIAGNOSIS — D649 Anemia, unspecified: Secondary | ICD-10-CM | POA: Diagnosis present

## 2023-11-02 DIAGNOSIS — I7300 Raynaud's syndrome without gangrene: Secondary | ICD-10-CM | POA: Diagnosis present

## 2023-11-02 DIAGNOSIS — Z85828 Personal history of other malignant neoplasm of skin: Secondary | ICD-10-CM | POA: Diagnosis not present

## 2023-11-02 HISTORY — PX: POSTERIOR CERVICAL FUSION/FORAMINOTOMY: SHX5038

## 2023-11-02 LAB — POCT I-STAT, CHEM 8
BUN: 29 mg/dL — ABNORMAL HIGH (ref 8–23)
Calcium, Ion: 1.14 mmol/L — ABNORMAL LOW (ref 1.15–1.40)
Chloride: 103 mmol/L (ref 98–111)
Creatinine, Ser: 1.2 mg/dL — ABNORMAL HIGH (ref 0.44–1.00)
Glucose, Bld: 106 mg/dL — ABNORMAL HIGH (ref 70–99)
HCT: 30 % — ABNORMAL LOW (ref 36.0–46.0)
Hemoglobin: 10.2 g/dL — ABNORMAL LOW (ref 12.0–15.0)
Potassium: 4 mmol/L (ref 3.5–5.1)
Sodium: 137 mmol/L (ref 135–145)
TCO2: 22 mmol/L (ref 22–32)

## 2023-11-02 LAB — TYPE AND SCREEN
ABO/RH(D): O POS
Antibody Screen: NEGATIVE

## 2023-11-02 SURGERY — POSTERIOR CERVICAL FUSION/FORAMINOTOMY LEVEL 1
Anesthesia: General | Site: Spine Cervical

## 2023-11-02 MED ORDER — ROCURONIUM BROMIDE 10 MG/ML (PF) SYRINGE
PREFILLED_SYRINGE | INTRAVENOUS | Status: AC
  Filled 2023-11-02: qty 10

## 2023-11-02 MED ORDER — MORPHINE SULFATE (PF) 2 MG/ML IV SOLN: 2.0000 mg | INTRAVENOUS | Status: DC | PRN

## 2023-11-02 MED ORDER — PREGABALIN 100 MG PO CAPS
100.0000 mg | ORAL_CAPSULE | Freq: Two times a day (BID) | ORAL | Status: DC
  Administered 2023-11-02 – 2023-11-03 (×3): 100 mg via ORAL
  Filled 2023-11-02 (×3): qty 1

## 2023-11-02 MED ORDER — ROCURONIUM BROMIDE 10 MG/ML (PF) SYRINGE
PREFILLED_SYRINGE | INTRAVENOUS | Status: DC | PRN
  Administered 2023-11-02: 20 mg via INTRAVENOUS
  Administered 2023-11-02 (×2): 50 mg via INTRAVENOUS
  Administered 2023-11-02: 20 mg via INTRAVENOUS

## 2023-11-02 MED ORDER — FESOTERODINE FUMARATE ER 4 MG PO TB24
4.0000 mg | ORAL_TABLET | Freq: Every day | ORAL | Status: DC
  Administered 2023-11-02 – 2023-11-03 (×2): 4 mg via ORAL
  Filled 2023-11-02 (×2): qty 1

## 2023-11-02 MED ORDER — LIDOCAINE 2% (20 MG/ML) 5 ML SYRINGE
INTRAMUSCULAR | Status: AC
  Filled 2023-11-02: qty 5

## 2023-11-02 MED ORDER — FENTANYL CITRATE (PF) 250 MCG/5ML IJ SOLN
INTRAMUSCULAR | Status: AC
  Filled 2023-11-02: qty 5

## 2023-11-02 MED ORDER — ONDANSETRON HCL 4 MG/2ML IJ SOLN: 4.0000 mg | Freq: Once | INTRAMUSCULAR | Status: DC | PRN

## 2023-11-02 MED ORDER — LORATADINE 10 MG PO TABS
10.0000 mg | ORAL_TABLET | Freq: Every day | ORAL | Status: DC
  Administered 2023-11-02: 10 mg via ORAL
  Filled 2023-11-02: qty 1

## 2023-11-02 MED ORDER — ALBUTEROL SULFATE (2.5 MG/3ML) 0.083% IN NEBU: 3.0000 mL | INHALATION_SOLUTION | Freq: Four times a day (QID) | RESPIRATORY_TRACT | Status: DC | PRN

## 2023-11-02 MED ORDER — ONDANSETRON HCL 4 MG/2ML IJ SOLN
INTRAMUSCULAR | Status: AC
  Filled 2023-11-02: qty 2

## 2023-11-02 MED ORDER — FENTANYL CITRATE (PF) 250 MCG/5ML IJ SOLN
INTRAMUSCULAR | Status: DC | PRN
  Administered 2023-11-02: 25 ug via INTRAVENOUS
  Administered 2023-11-02 (×2): 50 ug via INTRAVENOUS
  Administered 2023-11-02: 25 ug via INTRAVENOUS
  Administered 2023-11-02 (×2): 50 ug via INTRAVENOUS

## 2023-11-02 MED ORDER — DEXAMETHASONE SODIUM PHOSPHATE 10 MG/ML IJ SOLN
INTRAMUSCULAR | Status: DC | PRN
  Administered 2023-11-02: 5 mg via INTRAVENOUS

## 2023-11-02 MED ORDER — THROMBIN 5000 UNITS EX KIT
PACK | CUTANEOUS | Status: AC
  Filled 2023-11-02: qty 1

## 2023-11-02 MED ORDER — BUPIVACAINE LIPOSOME 1.3 % IJ SUSP
INTRAMUSCULAR | Status: DC | PRN
  Administered 2023-11-02: 20 mL

## 2023-11-02 MED ORDER — CHLORHEXIDINE GLUCONATE CLOTH 2 % EX PADS: 6.0000 | MEDICATED_PAD | Freq: Once | CUTANEOUS | Status: DC

## 2023-11-02 MED ORDER — ALBUMIN HUMAN 5 % IV SOLN: INTRAVENOUS | Status: DC | PRN

## 2023-11-02 MED ORDER — VANCOMYCIN HCL IN DEXTROSE 1-5 GM/200ML-% IV SOLN
1000.0000 mg | Freq: Once | INTRAVENOUS | Status: AC
  Administered 2023-11-02: 1000 mg via INTRAVENOUS
  Filled 2023-11-02: qty 200

## 2023-11-02 MED ORDER — CEFADROXIL 500 MG PO CAPS: 500.0000 mg | ORAL_CAPSULE | Freq: Every day | ORAL | Status: DC

## 2023-11-02 MED ORDER — ONDANSETRON HCL 4 MG/2ML IJ SOLN: 4.0000 mg | Freq: Four times a day (QID) | INTRAMUSCULAR | Status: DC | PRN

## 2023-11-02 MED ORDER — BUDESON-GLYCOPYRROL-FORMOTEROL 160-9-4.8 MCG/ACT IN AERO
2.0000 | INHALATION_SPRAY | Freq: Two times a day (BID) | RESPIRATORY_TRACT | Status: DC
  Administered 2023-11-02 – 2023-11-03 (×2): 2 via RESPIRATORY_TRACT
  Filled 2023-11-02: qty 5.9

## 2023-11-02 MED ORDER — OXYCODONE HCL 5 MG PO TABS: 10.0000 mg | ORAL_TABLET | ORAL | Status: DC | PRN

## 2023-11-02 MED ORDER — EPHEDRINE 5 MG/ML INJ
INTRAVENOUS | Status: AC
Start: 2023-11-02 — End: 2023-11-02
  Filled 2023-11-02: qty 5

## 2023-11-02 MED ORDER — SUGAMMADEX SODIUM 200 MG/2ML IV SOLN
INTRAVENOUS | Status: DC | PRN
Start: 2023-11-02 — End: 2023-11-02
  Administered 2023-11-02: 100 mg via INTRAVENOUS

## 2023-11-02 MED ORDER — PROPOFOL 10 MG/ML IV BOLUS
INTRAVENOUS | Status: AC
  Filled 2023-11-02: qty 20

## 2023-11-02 MED ORDER — DEXAMETHASONE SODIUM PHOSPHATE 10 MG/ML IJ SOLN
INTRAMUSCULAR | Status: AC
  Filled 2023-11-02: qty 1

## 2023-11-02 MED ORDER — ONDANSETRON HCL 4 MG/2ML IJ SOLN
INTRAMUSCULAR | Status: DC | PRN
  Administered 2023-11-02: 4 mg via INTRAVENOUS

## 2023-11-02 MED ORDER — TAMOXIFEN CITRATE 10 MG PO TABS
20.0000 mg | ORAL_TABLET | Freq: Every day | ORAL | Status: DC
  Administered 2023-11-03: 20 mg via ORAL
  Filled 2023-11-02: qty 2

## 2023-11-02 MED ORDER — CYCLOBENZAPRINE HCL 5 MG PO TABS
5.0000 mg | ORAL_TABLET | Freq: Three times a day (TID) | ORAL | Status: DC | PRN
  Filled 2023-11-02: qty 1

## 2023-11-02 MED ORDER — 0.9 % SODIUM CHLORIDE (POUR BTL) OPTIME
TOPICAL | Status: DC | PRN
  Administered 2023-11-02: 1000 mL

## 2023-11-02 MED ORDER — LACTATED RINGERS IV SOLN
INTRAVENOUS | Status: DC
Start: 2023-11-02 — End: 2023-11-03

## 2023-11-02 MED ORDER — DOCUSATE SODIUM 100 MG PO CAPS
100.0000 mg | ORAL_CAPSULE | Freq: Two times a day (BID) | ORAL | Status: DC
  Administered 2023-11-02 – 2023-11-03 (×3): 100 mg via ORAL
  Filled 2023-11-02 (×2): qty 1

## 2023-11-02 MED ORDER — THROMBIN 5000 UNITS EX SOLR
OROMUCOSAL | Status: DC | PRN
  Administered 2023-11-02: 5 mL via TOPICAL

## 2023-11-02 MED ORDER — MENTHOL 3 MG MT LOZG: 1.0000 | LOZENGE | OROMUCOSAL | Status: DC | PRN

## 2023-11-02 MED ORDER — PHENYLEPHRINE 80 MCG/ML (10ML) SYRINGE FOR IV PUSH (FOR BLOOD PRESSURE SUPPORT)
PREFILLED_SYRINGE | INTRAVENOUS | Status: DC | PRN
  Administered 2023-11-02: 160 ug via INTRAVENOUS

## 2023-11-02 MED ORDER — FINERENONE 10 MG PO TABS: 10.0000 mg | ORAL_TABLET | Freq: Every morning | ORAL | Status: DC

## 2023-11-02 MED ORDER — NIFEDIPINE ER OSMOTIC RELEASE 30 MG PO TB24
90.0000 mg | ORAL_TABLET | Freq: Every day | ORAL | Status: DC
  Filled 2023-11-02: qty 1

## 2023-11-02 MED ORDER — ACETAMINOPHEN 500 MG PO TABS
1000.0000 mg | ORAL_TABLET | Freq: Four times a day (QID) | ORAL | Status: AC
  Administered 2023-11-02 – 2023-11-03 (×4): 1000 mg via ORAL
  Filled 2023-11-02 (×4): qty 2

## 2023-11-02 MED ORDER — FAMOTIDINE 20 MG PO TABS: 20.0000 mg | ORAL_TABLET | Freq: Every day | ORAL | Status: DC | PRN

## 2023-11-02 MED ORDER — MEDIHONEY WOUND/BURN DRESSING EX PSTE
1.0000 | PASTE | Freq: Every day | CUTANEOUS | Status: DC
  Administered 2023-11-02 – 2023-11-03 (×2): 1 via TOPICAL
  Filled 2023-11-02 (×2): qty 44

## 2023-11-02 MED ORDER — BISACODYL 10 MG RE SUPP: 10.0000 mg | Freq: Every day | RECTAL | Status: DC | PRN

## 2023-11-02 MED ORDER — FENTANYL CITRATE (PF) 100 MCG/2ML IJ SOLN: 25.0000 ug | INTRAMUSCULAR | Status: DC | PRN

## 2023-11-02 MED ORDER — ATORVASTATIN CALCIUM 10 MG PO TABS
20.0000 mg | ORAL_TABLET | Freq: Every day | ORAL | Status: DC
  Administered 2023-11-02: 20 mg via ORAL
  Filled 2023-11-02: qty 2

## 2023-11-02 MED ORDER — ACETAMINOPHEN 650 MG RE SUPP: 650.0000 mg | RECTAL | Status: DC | PRN

## 2023-11-02 MED ORDER — ORAL CARE MOUTH RINSE: 15.0000 mL | Freq: Once | OROMUCOSAL | Status: DC

## 2023-11-02 MED ORDER — LACTATED RINGERS IV SOLN: INTRAVENOUS | Status: DC

## 2023-11-02 MED ORDER — BACITRACIN ZINC 500 UNIT/GM EX OINT
TOPICAL_OINTMENT | CUTANEOUS | Status: DC | PRN
  Administered 2023-11-02: 1 via TOPICAL

## 2023-11-02 MED ORDER — BUPIVACAINE LIPOSOME 1.3 % IJ SUSP
INTRAMUSCULAR | Status: AC
Start: 2023-11-02 — End: 2023-11-02
  Filled 2023-11-02: qty 20

## 2023-11-02 MED ORDER — PHENYLEPHRINE HCL-NACL 20-0.9 MG/250ML-% IV SOLN
INTRAVENOUS | Status: DC | PRN
  Administered 2023-11-02: 25 ug/min via INTRAVENOUS

## 2023-11-02 MED ORDER — ZOLPIDEM TARTRATE 5 MG PO TABS: 5.0000 mg | ORAL_TABLET | Freq: Every evening | ORAL | Status: DC | PRN

## 2023-11-02 MED ORDER — HYDROCODONE-ACETAMINOPHEN 5-325 MG PO TABS
1.0000 | ORAL_TABLET | ORAL | Status: DC | PRN
  Administered 2023-11-02: 1 via ORAL
  Filled 2023-11-02: qty 1

## 2023-11-02 MED ORDER — CEFADROXIL 500 MG PO CAPS
500.0000 mg | ORAL_CAPSULE | Freq: Once | ORAL | Status: AC
  Administered 2023-11-02: 500 mg via ORAL
  Filled 2023-11-02: qty 1

## 2023-11-02 MED ORDER — ZINC OXIDE 40 % EX OINT: TOPICAL_OINTMENT | CUTANEOUS | Status: DC | PRN

## 2023-11-02 MED ORDER — PHENYLEPHRINE 80 MCG/ML (10ML) SYRINGE FOR IV PUSH (FOR BLOOD PRESSURE SUPPORT)
PREFILLED_SYRINGE | INTRAVENOUS | Status: AC
  Filled 2023-11-02: qty 10

## 2023-11-02 MED ORDER — PHENOL 1.4 % MT LIQD: 1.0000 | OROMUCOSAL | Status: DC | PRN

## 2023-11-02 MED ORDER — CHLORHEXIDINE GLUCONATE 0.12 % MT SOLN
15.0000 mL | Freq: Once | OROMUCOSAL | Status: DC
  Filled 2023-11-02: qty 15

## 2023-11-02 MED ORDER — VANCOMYCIN HCL IN DEXTROSE 1-5 GM/200ML-% IV SOLN
1000.0000 mg | INTRAVENOUS | Status: DC
  Filled 2023-11-02: qty 200

## 2023-11-02 MED ORDER — BUPIVACAINE-EPINEPHRINE (PF) 0.5% -1:200000 IJ SOLN
INTRAMUSCULAR | Status: AC
  Filled 2023-11-02: qty 30

## 2023-11-02 MED ORDER — OXYCODONE HCL 5 MG PO TABS: 5.0000 mg | ORAL_TABLET | ORAL | Status: DC | PRN

## 2023-11-02 MED ORDER — LIDOCAINE 2% (20 MG/ML) 5 ML SYRINGE
INTRAMUSCULAR | Status: DC | PRN
  Administered 2023-11-02: 40 mg via INTRAVENOUS

## 2023-11-02 MED ORDER — BUPIVACAINE-EPINEPHRINE 0.5% -1:200000 IJ SOLN
INTRAMUSCULAR | Status: DC | PRN
  Administered 2023-11-02: 10 mL

## 2023-11-02 MED ORDER — AMISULPRIDE (ANTIEMETIC) 5 MG/2ML IV SOLN: 10.0000 mg | Freq: Once | INTRAVENOUS | Status: DC | PRN

## 2023-11-02 MED ORDER — ACETAMINOPHEN 325 MG PO TABS: 650.0000 mg | ORAL_TABLET | ORAL | Status: DC | PRN

## 2023-11-02 MED ORDER — ONDANSETRON HCL 4 MG PO TABS: 4.0000 mg | ORAL_TABLET | Freq: Four times a day (QID) | ORAL | Status: DC | PRN

## 2023-11-02 MED ORDER — BACITRACIN ZINC 500 UNIT/GM EX OINT
TOPICAL_OINTMENT | CUTANEOUS | Status: AC
  Filled 2023-11-02: qty 28.35

## 2023-11-02 MED ORDER — PROPOFOL 10 MG/ML IV BOLUS
INTRAVENOUS | Status: DC | PRN
  Administered 2023-11-02: 90 mg via INTRAVENOUS

## 2023-11-02 SURGICAL SUPPLY — 58 items
BAG COUNTER SPONGE SURGICOUNT (BAG) ×2 IMPLANT
BAND RUBBER #18 3X1/16 STRL (MISCELLANEOUS) ×4 IMPLANT
BENZOIN TINCTURE PRP APPL 2/3 (GAUZE/BANDAGES/DRESSINGS) IMPLANT
BIT DRILL NEURO 2X3.1 SFT TUCH (MISCELLANEOUS) ×2 IMPLANT
BLADE CLIPPER SURG (BLADE) IMPLANT
BLADE ULTRA TIP 2M (BLADE) IMPLANT
BUR 14 MATCH 3 (BUR) IMPLANT
BUR MR8 14 BALL 5 (BUR) IMPLANT
CABLE DBL STERILE W/CRIMP (Cable) IMPLANT
CABLE SNG STERILE W/CRIMP (Cable) IMPLANT
CANISTER SUCTION 3000ML PPV (SUCTIONS) ×2 IMPLANT
CAP CLSR POST CERV (Cap) IMPLANT
DRAPE C-ARM 42X72 X-RAY (DRAPES) ×4 IMPLANT
DRAPE LAPAROTOMY 100X72 PEDS (DRAPES) ×2 IMPLANT
DRAPE MICROSCOPE SLANT 54X150 (MISCELLANEOUS) IMPLANT
DRAPE SHEET LG 3/4 BI-LAMINATE (DRAPES) ×8 IMPLANT
DRAPE SURG 17X23 STRL (DRAPES) ×6 IMPLANT
DRSG OPSITE POSTOP 4X6 (GAUZE/BANDAGES/DRESSINGS) IMPLANT
ELECTRODE BLDE 4.0 EZ CLN MEGD (MISCELLANEOUS) IMPLANT
ELECTRODE REM PT RTRN 9FT ADLT (ELECTROSURGICAL) ×2 IMPLANT
FEE COVERAGE SUPPORT O-ARM (MISCELLANEOUS) ×2 IMPLANT
GAUZE 4X4 16PLY ~~LOC~~+RFID DBL (SPONGE) IMPLANT
GAUZE SPONGE 4X4 12PLY STRL (GAUZE/BANDAGES/DRESSINGS) IMPLANT
GLOVE BIO SURGEON STRL SZ8 (GLOVE) ×4 IMPLANT
GLOVE BIO SURGEON STRL SZ8.5 (GLOVE) ×4 IMPLANT
GLOVE EXAM NITRILE XL STR (GLOVE) IMPLANT
GOWN STRL REUS W/ TWL LRG LVL3 (GOWN DISPOSABLE) IMPLANT
GOWN STRL REUS W/ TWL XL LVL3 (GOWN DISPOSABLE) ×2 IMPLANT
GOWN STRL REUS W/TWL 2XL LVL3 (GOWN DISPOSABLE) ×2 IMPLANT
HEMOSTAT POWDER KIT SURGIFOAM (HEMOSTASIS) ×2 IMPLANT
KIT BASIN OR (CUSTOM PROCEDURE TRAY) ×2 IMPLANT
KIT TURNOVER KIT B (KITS) ×2 IMPLANT
MARKER SPHERE PSV REFLC NDI (MISCELLANEOUS) ×10 IMPLANT
NDL HYPO 22X1.5 SAFETY MO (MISCELLANEOUS) ×2 IMPLANT
NDL SPNL 18GX3.5 QUINCKE PK (NEEDLE) IMPLANT
NEEDLE HYPO 22X1.5 SAFETY MO (MISCELLANEOUS) ×2 IMPLANT
NEEDLE SPNL 18GX3.5 QUINCKE PK (NEEDLE) IMPLANT
NS IRRIG 1000ML POUR BTL (IV SOLUTION) ×2 IMPLANT
PACK LAMINECTOMY NEURO (CUSTOM PROCEDURE TRAY) ×2 IMPLANT
PAD ARMBOARD POSITIONER FOAM (MISCELLANEOUS) ×6 IMPLANT
PIN MAYFIELD SKULL DISP (PIN) ×2 IMPLANT
PUTTY DBM 5CC CALC GRAN (Putty) IMPLANT
ROD VIRAGE 3.5X35MM STRAIGHT (Cage) IMPLANT
ROD VIRAGE 30MMX3.5MM STR (Rod) IMPLANT
SCREW VIRAGE SM SHANK 3.5X26MM (Screw) IMPLANT
SPIKE FLUID TRANSFER (MISCELLANEOUS) ×2 IMPLANT
SPONGE NEURO XRAY DETECT 1X3 (DISPOSABLE) IMPLANT
SPONGE SURGIFOAM ABS GEL SZ50 (HEMOSTASIS) IMPLANT
SPONGE T-LAP 4X18 ~~LOC~~+RFID (SPONGE) IMPLANT
STAPLER SKIN PROX 35W (STAPLE) IMPLANT
STRIP CLOSURE SKIN 1/2X4 (GAUZE/BANDAGES/DRESSINGS) IMPLANT
SUT ETHILON 2 0 FS 18 (SUTURE) IMPLANT
SUT VIC AB 0 CT1 18XCR BRD8 (SUTURE) ×2 IMPLANT
SUT VIC AB 2-0 CP2 18 (SUTURE) ×2 IMPLANT
TOWEL GREEN STERILE (TOWEL DISPOSABLE) ×2 IMPLANT
TOWEL GREEN STERILE FF (TOWEL DISPOSABLE) ×2 IMPLANT
TRAY FOLEY MTR SLVR 16FR STAT (SET/KITS/TRAYS/PACK) IMPLANT
WATER STERILE IRR 1000ML POUR (IV SOLUTION) ×2 IMPLANT

## 2023-11-02 NOTE — Transfer of Care (Signed)
 Immediate Anesthesia Transfer of Care Note  Patient: Heidi Lutz  Procedure(s) Performed: POSTERIOR CERVICAL FUSION/FORAMINOTOMY CERVICAL ONE-TWO (Spine Cervical) APPLICATION OF O-ARM  Patient Location: PACU  Anesthesia Type:General  Level of Consciousness: drowsy  Airway & Oxygen Therapy: Patient Spontanous Breathing  Post-op Assessment: Report given to RN and Post -op Vital signs reviewed and stable  Post vital signs: Reviewed and stable  Last Vitals:  Vitals Value Taken Time  BP 132/63 11/02/23 10:59  Temp    Pulse 105 11/02/23 11:02  Resp 15 11/02/23 11:02  SpO2 98 % 11/02/23 11:02  Vitals shown include unfiled device data.  Last Pain:  Vitals:   11/02/23 0547  TempSrc: Oral         Complications: No notable events documented.

## 2023-11-02 NOTE — Anesthesia Procedure Notes (Signed)
 Procedure Name: Intubation Date/Time: 11/02/2023 7:53 AM  Performed by: Elby Raelene SAUNDERS, CRNAPre-anesthesia Checklist: Patient identified, Emergency Drugs available, Suction available and Patient being monitored Patient Re-evaluated:Patient Re-evaluated prior to induction Oxygen Delivery Method: Circle System Utilized Preoxygenation: Pre-oxygenation with 100% oxygen Induction Type: IV induction Ventilation: Mask ventilation without difficulty Laryngoscope Size: Glidescope and 3 Grade View: Grade II Tube type: Oral Tube size: 7.0 mm Number of attempts: 1 Airway Equipment and Method: Stylet and Bite block Placement Confirmation: ETT inserted through vocal cords under direct vision, positive ETCO2 and breath sounds checked- equal and bilateral Secured at: 20 cm Tube secured with: Tape Dental Injury: Teeth and Oropharynx as per pre-operative assessment  Comments: Neck remains in neutral and stable position with intubation

## 2023-11-02 NOTE — Anesthesia Postprocedure Evaluation (Signed)
 Anesthesia Post Note  Patient: Heidi Lutz  Procedure(s) Performed: POSTERIOR CERVICAL FUSION/FORAMINOTOMY CERVICAL ONE-TWO (Spine Cervical) APPLICATION OF O-ARM     Patient location during evaluation: PACU Anesthesia Type: General Level of consciousness: awake and alert and oriented Pain management: pain level controlled Vital Signs Assessment: post-procedure vital signs reviewed and stable Respiratory status: spontaneous breathing, nonlabored ventilation and respiratory function stable Cardiovascular status: blood pressure returned to baseline and stable Postop Assessment: no apparent nausea or vomiting Anesthetic complications: no   No notable events documented.  Last Vitals:  Vitals:   11/02/23 1145 11/02/23 1200  BP: 125/63 129/62  Pulse: 94 93  Resp: 11 15  Temp:    SpO2: 95% 97%    Last Pain:  Vitals:   11/02/23 1200  TempSrc:   PainSc: 0-No pain   Pain Goal:    LLE Motor Response: Responds to commands (11/02/23 1200)   RLE Motor Response: Purposeful movement, Responds to commands (11/02/23 1200)          [name]

## 2023-11-02 NOTE — Op Note (Signed)
 Brief history: The patient is an 83 year old white female with rheumatoid arthritis, history of breast cancer, chronic neck and back pain, excetra.  She fell suffering C1 and C2 fractures.  She initially elected to attempt to treat this in a collar.  He continues to have neck pain and significant instability at C1-2.  We discussed the various treatment options including a C1-2 instrumentation fusion.  She has decided proceed with surgery.  Preop diagnosis C1 fracture, C2 fracture, type II odontoid fracture with anterior displacement, cervicalgia  Postop diagnosis: The same  Procedure: Posterior C1-C2 instrumentation  with Zimmer titanium lateral mass and pars screws; C1-2 posterior arthrodesis with local autograft bone and intra grow bone graft extender; application of spinal navigation/O-arm  Surgeon: Dr. Chyrl Cedar  Assistant: Duwaine Beck, NP  Anesthesia: General Tracheal  Estimated blood loss: 150 cc  Specimens: None  Drains: None  Complications: None  Description of procedure: The patient was brought to the operating room by the anesthesia team.  General endotracheal anesthesia was induced.  I then applied the [place] 3 point headrest to the patient's calvarium.  We carefully turned her to the prone position on the chest rolls with her head supported in the [place] 3 point headrest.  We confirmed good position with a lateral C-spine x-ray.  The patient's suboccipital region was then shaved with clippers and prepared with Betadine scrub and Betadine solution.  Sterile drapes were applied.  I injected the area to be incised with Marcaine  with epinephrine  solution.  I used a scalpel to make a linear midline incision at the occipital cervical junction.  I used electrocautery to perform a bilateral subperiosteal dissection exposing C1 to and C2-3.  I applied the reference array to the patient's cervical spinous process.  We obtained intraoperative CT scan using the O-arm.  We then  inserted the [employer] [facility] retractor for exposure.  I then carefully exposed the lateral masses at C1.  We encountered some venous bleeding as suspected which we controlled with bipolar cautery and Gelfoam.  Under O-arm guidance drilled a pilot hole into the bilateral C1 lateral masses.  I placed a 26 mm partially-threaded screw into the C1 lateral mass bilaterally under spinal navigation.  We got good bony purchase.  I then drilled a pilot hole into the bilateral C2 lamina/pars under spinal neuronavigation.  I inserted a 26 mm partially-threaded screw into the bilateral C2 pars/lamina and again got good bony purchase.  I connected the unilateral screws with a rod.  We secured it in place with the caps which were tightened appropriately completing instrumentation at C1-2.  We now turned our attention to the arthrodesis.  I used a high-speed drill to decorticate the bilateral C1 and C2 lamina.  We used some of the bone to assess autograft and placed into Grotegut bone graft extender over these decorticated structures completed partial arthrodesis at C1-2.  We then obtained necessities of bipolar cautery.  We injected Exparel .  We removed the retractor.  We reapproximated the patient's cervical fascia with interrupted 0 Vicryl suture.  We then approximated the subcutaneous tissue with interrupted 2-0 Vicryl suture.  We reapproximated the skin with Steri-Strips and benzoin.  The wound was then coated with bacitracin  ointment.  A sterile dressing was applied.  The drapes were removed.  She was then carefully turned to the supine position.  I then removed the [place] 3 point headrest from her calvarium.  By report all sponge, instrument, and needle counts were correct at the end of this case.

## 2023-11-02 NOTE — H&P (Signed)
 Subjective: The patient is an 83 year old white female with history of rheumatoid arthritis breast cancer anemia, excetra.  She fell and suffered a C1-2 fracture.  Subsequent x-rays have demonstrated significant instability/subluxation.  I discussed the various treatment options with the patient and family.  She has decided proceed with surgery.  Past Medical History:  Diagnosis Date   Age-related osteoporosis without current pathological fracture 10/31/2019   Allergic rhinitis 07/31/2007   Overview:   S/p allergy shots, 20 years ago Dr. Mariea     10/1 IMO update   Anemia    Aortic atherosclerosis (HCC) 08/16/2022   Basal cell carcinoma 12/15/2014   Overview:   Managed by Dr. Tricia, derm   Cancer [facility]) 01/2017   left breast cancer, basal cell and 1 melanoma   Cervical spondylosis 07/13/2022   Closed compression fracture of L1 lumbar vertebra, with routine healing, subsequent encounter 11/26/2013   Overview:   Normal DEXA 2015     Last Assessment & Plan:   New order for DEXA written today. Patient is getting epidural steroid injections with pain management  Overview:   Overview:   Normal DEXA 2015     Last Assessment & Plan:   continued improvement in pain, will refill tramadol  and robaxin today.  Will return to PCP for follow-up   Complication of anesthesia    heart rate spikeed in the PACU on her 2nd knee surgery   Environmental allergies    Essential hypertension 09/19/2008   Last Assessment & Plan:   Blood pressure is elevated today. I reviewed her last few pain management notes her blood pressure was normal. Patient is asymptomatic and I suspect today's blood pressure is an outlier. She will continue home monitoring at her offices and will return for further evaluation if remains elevated   GAD (generalized anxiety disorder) 04/25/2017   Last Assessment & Plan:   See a/p above   High cholesterol    History of UTI 05/25/2022   History of vertebral compression fracture 07/16/2019    Hyperlipidemia 04/22/2009   Last Assessment & Plan:   Compliant with statin, continue   IBS (irritable bowel syndrome) 08/18/2006   Overview:   with IBS        Last Assessment & Plan:   Refill Bentyl for when necessary use. No evidence of infectious or inflammatory, or malignancy symptoms. Advised to follow-up for further evaluation if more frequent or new symptoms   Insomnia 02/02/2010   Last Assessment & Plan:   Refilled Ambien  quantity #30 for the next calendar year. She denies excessive sedation and is aware to not drive when taking medication.   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  Will start Lexapro today.  We discussed interim use of benzodiazepines for pre-procedural or severe episodes.  She reports she has had panic attacks in the past but has not had them recently.  She never took diazepam that was previously prescribed for a procedure.  We discussed   Malignant neoplasm of central portion of left breast in female, estrogen receptor positive (HCC) 03/16/2017   Mixed dyslipidemia 03/22/2017   Mixed hyperlipidemia 03/22/2017   OAB (overactive bladder) 02/2020   Osteoarthritis    Osteoarthritis of spine with radiculopathy, lumbar region 02/27/2015   Osteopenia 03/03/2017   Overview:  DEXA 02/2017   Personal history of radiation therapy    Pleuritic chest pain 04/10/2018   PONV (postoperative nausea and vomiting)    with 1st knee surgery had n/v, none since   Pre-operative cardiovascular examination 03/22/2017   Raynaud disease    Raynauds disease-takes procardia    Raynaud's disease 08/18/2006   Rheumatoid arthritis (HCC)    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 Signa -- [place], [place]   Urge incontinence 05/25/2022    Past Surgical History:  Procedure Laterality Date   ABDOMINAL HYSTERECTOMY  1972   APPENDECTOMY  1953   BREAST BIOPSY Left 05/19/2020   benign   BREAST LUMPECTOMY Left 2019   BREAST LUMPECTOMY WITH RADIOACTIVE SEED AND SENTINEL LYMPH NODE BIOPSY Left 04/07/2017   Procedure: BREAST LUMPECTOMY WITH RADIOACTIVE SEED AND SENTINEL LYMPH NODE BIOPSY;  Surgeon: Vanderbilt Ned, MD;  Location: [facility] OR;  Service: General;  Laterality: Left;   BUNIONECTOMY  1999   COLONOSCOPY     EYE SURGERY     bil cataract   GANGLION CYST EXCISION  1968   JOINT REPLACEMENT Left 2017    reverse shoulder replacement   REPLACEMENT TOTAL KNEE  2012, 2013   Right and Left   SHOULDER ARTHROSCOPY Left    SYMPATHECTOMY  1970   TONSILLECTOMY  1949    Allergies  Allergen Reactions   Fluorouracil Hives   Other Hives, Itching, Swelling, Rash and Other (See Comments)    NO -CILLIN(s)   Clavulanic Acid Other (See Comments)    Clavulanic acid is a medication that can be used in conjunction with amoxicillin to manage and treat bacterial infections, specifically bacteria that are beta-lactamase producers. It is in the beta-lactamase inhibitor class of medications. Reaction undefined, but is allergic if it is paired with any -cillin(s)   Codeine Nausea And Vomiting   Hydrocodone  Nausea And Vomiting   Penicillins Hives, Swelling, Rash and Other (See Comments)    Pt  states allergic to all cillin drugs. Joint pain.    Social History   Tobacco Use   Smoking status: Former    Current packs/day: 0.00    Types: Cigarettes    Quit date: 02/22/1963    Years since quitting: 60.7    Passive exposure: Past   Smokeless tobacco: Never  Substance Use Topics   Alcohol use: Yes    Alcohol/week: 14.0 standard drinks of alcohol    Types: 14 Glasses of wine per week    Comment: 2 glasses of wine daily.     Family History  Adopted: Yes  Problem Relation Age of Onset   Breast cancer Neg Hx    Prior to Admission medications   Medication Sig Start Date End Date Taking? Authorizing Provider  albuterol  (VENTOLIN  HFA) 108 (90 Base) MCG/ACT inhaler Inhale 2 puffs into the lungs every 6 (six) hours as needed for wheezing or shortness of breath.   Yes [provider]  atorvastatin  (LIPITOR) 20 MG tablet Take 20 mg by mouth at bedtime.   Yes [provider]  Black Cohosh 540 MG CAPS Take 540 mg by mouth daily.    Yes [provider]  Calcium  Carb-Cholecalciferol (CALCIUM  + D3 PO) Take 1 tablet by mouth 2 (two) times daily.   Yes [provider]  cefadroxil  (DURICEF) 500 MG capsule Take 1 capsule (500 mg total) by mouth daily. 10/23/23  Yes Rashid, Farhan, MD  CRANBERRY PO Take 1 capsule by mouth daily.    Yes [provider]  famotidine  (PEPCID ) 40 MG tablet Take 0.5 tablets (20 mg total) by mouth daily as needed for heartburn or indigestion. 10/22/23  Yes Rashid, Farhan, MD  fosfomycin (MONUROL ) 3 g PACK Take 1 packet by mouth every 10 days Patient taking differently: Take 3 g by mouth See admin instructions. Take 3 grams (1 packet) by mouth every 10 days 05/10/23  Yes Stoneking, Adine PARAS., MD  Glucosamine-Chondroit-Vit C-Mn (GLUCOSAMINE-CHONDROITIN MAX ST) CAPS Take 1 capsule by mouth daily.    Yes [provider]  HYDROcodone -acetaminophen  (NORCO/VICODIN) 5-325 MG tablet Take 1 tablet by mouth at bedtime.   Yes  [provider]  KERENDIA  10 MG TABS Take 10 mg by mouth in the morning.   Yes [provider]  lidocaine  (LIDODERM ) 5 % APPLY 2 PATCHES TO SKIN EVERY DAY, REMOVE AND REPLACE PATCH AFTER 12 HRS Patient taking differently: Place 2 patches onto the skin daily as needed (pain). 08/13/20  Yes Raulkar, Sven SQUIBB, MD  loratadine  (CLARITIN ) 10 MG tablet Take 10 mg by mouth daily.   Yes [provider]  Magnesium  200 MG TABS Take 200 mg by mouth daily.    Yes [provider]  multivitamin (RENA-VIT) TABS tablet Take 1 tablet by mouth daily. 05/01/23  Yes [provider]  NIFEdipine  (PROCARDIA  XL/ADALAT -CC) 90 MG 24 hr tablet Take 90 mg by mouth daily.    Yes [provider]  pregabalin  (LYRICA ) 50 MG capsule Take 100 mg by mouth 2 (two) times daily.   Yes [provider]  Probiotic Product (PROBIOTIC ADVANCED PO) Take 1 capsule by mouth daily.    Yes [provider]  silver sulfADIAZINE (SILVADENE) 1 % cream Apply 1 Application topically daily. 10/03/23  Yes [provider]  solifenacin  (VESICARE ) 10 MG tablet Take 1 tablet (10 mg total) by mouth every evening. 05/10/23  Yes Stoneking, Adine PARAS., MD  tamoxifen  (NOLVADEX ) 20 MG tablet TAKE 1 TABLET EVERY DAY Patient taking differently: Take 20 mg by mouth in the morning. 08/18/23  Yes Odean Potts, MD  traMADol  (ULTRAM ) 50 MG tablet Take 1 tablet (50 mg total) by mouth every 6 (six) hours as needed. Patient taking differently: Take 50 mg by mouth every 6 (six) hours as needed (for pain). 09/20/23  Yes Keith, Kayla N, PA-C  TYLENOL  8 HOUR ARTHRITIS PAIN 650 MG CR tablet Take 1,300 mg by mouth daily as needed for pain.   Yes [provider]  BREZTRI  AEROSPHERE 160-9-4.8 MCG/ACT AERO inhaler Inhale 2 puffs into the lungs 2 (two) times daily. Patient not taking: Reported on 10/19/2023 05/15/23   [provider]  clobetasol cream (TEMOVATE) 0.05 % Apply 1 Application  topically See admin instructions. Apply to affected areas 2 times a day Patient not taking: Reported on 10/19/2023    [provider]  denosumab (PROLIA) 60 MG/ML SOSY injection Inject 60 mg into the skin every 6 (six) months.    [provider]  estradiol  (ESTRACE ) 0.1 MG/GM vaginal cream Place 1 Applicatorful vaginally at bedtime. Patient not taking: Reported on 10/19/2023 06/26/22   [provider]     Review of Systems  Positive ROS: As above  All other systems have been reviewed and were otherwise negative with the exception of those mentioned in the HPI and as above.  Objective: Vital signs in last 24 hours: Temp:  [97.6 F (36.4 C)] 97.6 F (36.4 C) (09/11 0547) Pulse Rate:  [100] 100 (09/11 0547) Resp:  [18] 18 (09/11 0547) BP: (143)/(63) 143/63 (09/11 0547) SpO2:  [96 %] 96 % (09/11 0547) Weight:  [53.5 kg] 53.5 kg (09/11 0547) Estimated body mass index is 21.58 kg/m as calculated from the following:   Height as of this encounter: 5' 2 (1.575 m).   Weight as of this encounter: 53.5 kg.   General Appearance: Alert, pleasant, frail Head: Normocephalic, without obvious abnormality, atraumatic Eyes: PERRL, conjunctiva/corneas clear, EOM's intact,    Ears: Normal  Throat: Normal  Neck: She is wearing an Aspen collar Back: unremarkable Lungs: Clear to auscultation bilaterally, respirations unlabored Heart: Regular rate and rhythm, no murmur, rub or gallop Abdomen: Soft, non-tender Extremities: Arthritic  skin: Ecchymosis, she has a decubitus ulcer on her left buttocks.  NEUROLOGIC:   Mental status: alert and oriented,Motor Exam - grossly normal Sensory Exam - grossly normal Reflexes:  Coordination - grossly normal Gait - grossly normal Balance - grossly normal Cranial Nerves: I: smell Not tested  II: visual acuity  OS: Normal  OD: Normal   II: visual fields Full to confrontation  II: pupils Equal, round, reactive to light  III,VII:  ptosis None  III,IV,VI: extraocular muscles  Full ROM  V: mastication Normal  V: facial light touch sensation  Normal  V,VII: corneal reflex  Present  VII: facial muscle function - upper  Normal  VII: facial muscle function - lower Normal  VIII: hearing Not tested  IX: soft palate elevation  Normal  IX,X: gag reflex Present  XI: trapezius strength  5/5  XI: sternocleidomastoid strength 5/5  XI: neck flexion strength  5/5  XII: tongue strength  Normal    Data Review Lab Results  Component Value Date   WBC 6.1 10/30/2023   HGB 10.2 (L) 11/02/2023   HCT 30.0 (L) 11/02/2023   MCV 104.8 (H) 10/30/2023   PLT 135 (L) 10/30/2023   Lab Results  Component Value Date   NA 137 11/02/2023   K 4.0 11/02/2023   CL 103 11/02/2023   CO2 24 10/20/2023   BUN 29 (H) 11/02/2023   CREATININE 1.20 (H) 11/02/2023   GLUCOSE 106 (H) 11/02/2023   No results found for: INR, PROTIME  Assessment/Plan: C1, C2 fracture patient/instability: I have discussed the situation with the patient and her family.  She is not a great surgical candidate but she is not healing in a collar and is unstable.  We discussed the various treatment options including a posterior C1-2 instrumentation and fusion.  I described the surgery to her, her husband and daughter.  I have shown them surgical models.  We have discussed the risk, benefits, alternatives, expected postoperative course, and likelihood of achieving her goals with surgery.  I have answered all her questions.  She has decided to proceed with surgery.   Heidi Lutz 11/02/2023 7:25 AM

## 2023-11-02 NOTE — TOC Initial Note (Signed)
 Transition of Care [facility]) - Initial/Assessment Note    Patient Details  Name: Heidi Lutz MRN: [id number] Date of Birth: 1940-11-21  [facility]) CM/SW Contact:    Justina Delcia Czar, RN Phone Number: [phone] 11/02/2023, 2:19 PM  Clinical Narrative:                 Inpatient CM spoke to pt and husband at bedside. Pt states she is from [facility] and states [facility] is holding a SNF rehab bed for her at dc.   Referral/message sent to ICSW to follow on SNF rehab bed at facility. Gave permission to create FL2 and complete referral to [facility] rehab.   Expected Discharge Plan: Skilled Nursing Facility Barriers to Discharge: Continued Medical Work up   Patient Goals and CMS Choice Patient states their goals for this hospitalization and ongoing recovery are:: wants to recover CMS Medicare.gov Compare Post Acute Care list provided to:: Patient Choice offered to / list presented to : Patient      Expected Discharge Plan and Services   Discharge Planning Services: CM Consult Post Acute Care Choice: Skilled Nursing Facility Living arrangements for the past 2 months: Assisted Living Facility                                      Prior Living Arrangements/Services Living arrangements for the past 2 months: Assisted Living Facility Lives with:: Spouse Patient language and need for interpreter reviewed:: Yes Do you feel safe going back to the place where you live?: Yes      Need for Family Participation in Patient Care: Yes (Comment) Care giver support system in place?: Yes (comment) Current home services: DME (rolling walker, shower seat) Criminal Activity/Legal Involvement Pertinent to Current Situation/Hospitalization: No - Comment as needed  Activities of Daily Living      Permission Sought/Granted Permission sought to share information with : Case Manager, PCP, Family Supports Permission granted to share information with : Yes,  Verbal Permission Granted  Share Information with NAME: Ariannah Arenson  Permission granted to share info w AGENCY: SNF  Permission granted to share info w Relationship: husband  Permission granted to share info w Contact Information: [phone]  Emotional Assessment Appearance:: Appears stated age Attitude/Demeanor/Rapport: Engaged Affect (typically observed): Accepting Orientation: : Oriented to Self, Oriented to Place, Oriented to  Time, Oriented to Situation   Psych Involvement: No (comment)  Admission diagnosis:  Anterior displaced type ii dens fracture, subsequent encounter for fracture with delayed healing [S12.110G] Closed C2 fracture (HCC) [S12.100A] Patient Active Problem List   Diagnosis Date Noted   Closed C2 fracture (HCC) 11/02/2023   Acute encephalopathy 10/18/2023   Symptomatic anemia 09/22/2023   Aortic atherosclerosis (HCC) 08/16/2022   Cervical spondylosis 07/13/2022   History of UTI 05/25/2022   Urge incontinence 05/25/2022   Rheumatoid arthritis (HCC)    Raynaud disease    PONV (postoperative nausea and vomiting)    Personal history of radiation therapy    Osteoarthritis    High cholesterol    Environmental allergies    Complication of anesthesia    OAB (overactive bladder) 02/2020   Age-related osteoporosis without current pathological fracture 10/31/2019   History of vertebral compression fracture 07/16/2019   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   Pleuritic chest pain 04/10/2018   GAD (generalized anxiety disorder) 04/25/2017   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   LBBB (left  bundle branch block) 03/22/2017   Pre-operative cardiovascular examination 03/22/2017   Mixed dyslipidemia 03/22/2017   Mixed hyperlipidemia 03/22/2017   Malignant neoplasm of central portion of left breast in female, estrogen receptor positive (HCC) 03/16/2017   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/2017   Osteopenia 03/03/2017   Cancer (HCC) 01/2017    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   Lumbar degenerative disc disease 02/27/2015   Lumbar spinal stenosis 02/27/2015   Osteoarthritis of spine with radiculopathy, lumbar region 02/27/2015   Basal cell carcinoma 12/15/2014   Closed compression fracture of L1 lumbar vertebra, with routine healing, subsequent encounter 11/26/2013   Insomnia 02/02/2010   Hyperlipidemia 04/22/2009   Essential hypertension 09/19/2008   Allergic rhinitis 07/31/2007   IBS (irritable bowel syndrome) 08/18/2006   Raynaud's disease 08/18/2006   PCP:  Burney Darice CROME, MD Pharmacy:   [id number] - [place], [place] - [address] [place] [address] Phone: [phone] Fax: [phone]     Social Drivers of Health (SDOH) Social History: SDOH Screenings   Food Insecurity: No Food Insecurity (10/19/2023)  Housing: Low Risk  (10/19/2023)  Transportation Needs: No Transportation Needs (10/19/2023)  Utilities: Not At Risk (10/19/2023)  Depression (PHQ2-9): Low Risk  (03/20/2020)  Financial Resource Strain: Low Risk  (05/03/2021)   Received from [facility]  Physical Activity: Insufficiently Active (05/03/2021)   Received from [facility]  Social Connections: Unknown (10/19/2023)  Stress: No Stress Concern Present (05/03/2021)   Received from [facility]  Tobacco Use: Medium Risk (11/02/2023)   SDOH Interventions:     Readmission Risk Interventions     No data to display

## 2023-11-02 NOTE — Consult Note (Signed)
 WOC Nurse Consult Note: Reason for Consult:  multiple sacral ulcers Wound type: Unstageable pressure injury to R upper buttocks Moisture associated skin damage to bilateral buttocks (patient reports episodes of incontinence during the night and utilizes adult briefs) Pressure Injury POA: Yes Measurement:  R upper buttocks: 2 cm x 2 cm  Wound bed:  R upper buttocks: 100% yellow slough Bilateral buttocks: pink/ red Drainage (amount, consistency, odor)  R upper buttocks: serosanguinous Bilateral buttocks: none Periwound: intact Dressing procedure/placement/frequency:  R upper buttocks: cleanse with NS, pat dry. Apply 1/4 thick layer of leptospermum honey to wound bed, top with silicone foam, change daily. Ok to lift silicone foam to reapply Medihoney daily.    Bilateral buttocks MASD: cleanse post voiding, apply Desitin to bilateral buttocks.   WOC team will not follow patient at this time, please re-consult if new needs arise.   Thank you,  Doyal Polite, RN, MSN, [facility] WOC Team [phone] (Available Mon-Fri 0700-1500)

## 2023-11-02 NOTE — Progress Notes (Signed)
 Orthopedic Tech Progress Note Patient Details:  Heidi Lutz 04-May-1940 [id number]  Pt has on [employer]AM[employer] COLLAR   Patient ID: Heidi Lutz, female   DOB: [birth year], 83 y.o.   MRN: [id number]  [name] Pac 11/02/2023, 1:58 PM

## 2023-11-02 NOTE — Plan of Care (Signed)

## 2023-11-03 ENCOUNTER — Other Ambulatory Visit (HOSPITAL_COMMUNITY): Payer: Self-pay

## 2023-11-03 MED ORDER — CYCLOBENZAPRINE HCL 5 MG PO TABS: 5.0000 mg | ORAL_TABLET | Freq: Three times a day (TID) | ORAL | Status: AC | PRN

## 2023-11-03 MED ORDER — CHLORHEXIDINE GLUCONATE CLOTH 2 % EX PADS
6.0000 | MEDICATED_PAD | Freq: Every day | CUTANEOUS | Status: DC
  Administered 2023-11-03: 6 via TOPICAL

## 2023-11-03 MED ORDER — MEDIHONEY WOUND/BURN DRESSING EX PSTE: 1.0000 | PASTE | Freq: Every day | CUTANEOUS | Status: DC

## 2023-11-03 MED ORDER — MUPIROCIN 2 % EX OINT: 1.0000 | TOPICAL_OINTMENT | Freq: Two times a day (BID) | CUTANEOUS | Status: AC

## 2023-11-03 MED ORDER — ONDANSETRON HCL 4 MG PO TABS: 4.0000 mg | ORAL_TABLET | Freq: Four times a day (QID) | ORAL | Status: AC | PRN

## 2023-11-03 MED ORDER — DOCUSATE SODIUM 100 MG PO CAPS: 100.0000 mg | ORAL_CAPSULE | Freq: Two times a day (BID) | ORAL | Status: AC

## 2023-11-03 MED ORDER — MUPIROCIN 2 % EX OINT
1.0000 | TOPICAL_OINTMENT | Freq: Two times a day (BID) | CUTANEOUS | Status: DC
  Administered 2023-11-03: 1 via NASAL
  Filled 2023-11-03: qty 22

## 2023-11-03 MED ORDER — HYDROCODONE-ACETAMINOPHEN 5-325 MG PO TABS
1.0000 | ORAL_TABLET | Freq: Four times a day (QID) | ORAL | 0 refills | Status: AC | PRN
  Filled 2023-11-03: qty 40, 5d supply, fill #0

## 2023-11-03 NOTE — Progress Notes (Signed)
 Subjective: The patient is alert and pleasant.  She looks and feels much better since surgery.  Objective: Vital signs in last 24 hours: Temp:  [97.8 F (36.6 C)-98.8 F (37.1 C)] 98.3 F (36.8 C) (09/12 0509) Pulse Rate:  [74-104] 96 (09/12 0509) Resp:  [11-20] 18 (09/12 0509) BP: (100-138)/(44-87) 121/57 (09/12 0509) SpO2:  [92 %-100 %] 100 % (09/12 0509) Estimated body mass index is 21.58 kg/m as calculated from the following:   Height as of this encounter: 5' 2 (1.575 m).   Weight as of this encounter: 53.5 kg.   Intake/Output from previous day: 09/11 0701 - 09/12 0700 In: 1840 [P.O.:240; I.V.:1100; IV Piggyback:500] Out: 75 [Blood:75] Intake/Output this shift: No intake/output data recorded.  Physical exam the patient is alert and oriented.  She is moving all 4 extremities well.  Lab Results: Recent Labs    11/02/23 0647  HGB 10.2*  HCT 30.0*   BMET Recent Labs    11/02/23 0647  NA 137  K 4.0  CL 103  GLUCOSE 106*  BUN 29*  CREATININE 1.20*    Studies/Results: DG O-ARM IMAGE ONLY/NO REPORT Result Date: 11/02/2023 There is no Radiologist interpretation  for this exam.  DG Cervical Spine 2 or 3 views Result Date: 11/02/2023 CLINICAL DATA:  Surgical localization. EXAM: CERVICAL SPINE - 2-3 VIEW COMPARISON:  October 24, 2023. FINDINGS: Two intraoperative cross-table lateral projections were obtained of the cervical spine. Only the first 5 cervical vertebra are noted. 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 IMPRESSION: Limited exam as only the first 5 cervical vertebra are visualized. 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 Electronically Signed   By: Lynwood Landy Raddle M.D.   On: 11/02/2023 11:43    Assessment/Plan: Postop day 1: The patient doing very well.  We will mobilize her with PT.  She might go home later on today.  I gave her discharge instructions and answered all her questions.  LOS: 1 day     Reyes JONETTA Budge 11/03/2023,  6:51 AM     Patient ID: [name], female   DOB: 07-Oct-1940, 83 y.o.   MRN: [id number]

## 2023-11-03 NOTE — Progress Notes (Signed)
 Patient alert and oriented, mae's well, voiding adequate amount of urine, swallowing without difficulty, no c/o pain at time of discharge. Patient discharged to facility with husband. Script and discharged instructions given to patient. Patient, daughter and husband stated understanding of instructions given. Room was checked and accounted for all patient's belongings; discharge instructions concerning her medications, incision care, follow up appointment and when to call the doctor as needed were all discussed with patient by RN and She expressed understanding on the instructions given.

## 2023-11-03 NOTE — Discharge Instructions (Addendum)
 Wound Care Keep incision covered and dry until post op day 3. You may remove the Honeycomb dressing on post op day 3. Leave steri-strips on the incision.  They will fall off by themselves. Do not put any creams, lotions, or ointments on incision. You are fine to shower. Let water run over incision and pat dry.  Activity Walk each and every day, increasing distance each day. No lifting greater than 8 lbs.  Avoid excessive neck motion. No driving for 2 weeks; may ride as a passenger locally.  Diet Resume your normal diet.    Call Your Doctor If Any of These Occur Redness, drainage, or swelling at the wound.  Temperature greater than 101 degrees. Severe pain not relieved by pain medication. Incision starts to come apart.  Follow Up Appt Call [phone] today for appointment in 3-4 weeks if you don't already have one or for any problems.  If you have any hardware placed in your spine, you will need an x-ray before your appointment.   Wound care Q Shift Routine   Comment: R upper buttocks: cleanse with NS, pat dry. Apply 1/4 thick layer of leptospermum honey to wound bed, top with silicone foam, change daily. Ok to lift silicone foam to reapply Medihoney daily.  Bilateral buttocks MASD: cleanse post voiding, apply Desitin to bilateral buttocks.

## 2023-11-03 NOTE — Progress Notes (Signed)
 Report giving to receiving RN at the facility expecting patient. Awaiting family for transportation to facility.

## 2023-11-03 NOTE — Plan of Care (Signed)
 Problem: Education: Goal: Knowledge of General Education information will improve Description: Including pain rating scale, medication(s)/side effects and non-pharmacologic comfort measures 11/03/2023 0910 by Sherleen Flor, RN Outcome: Completed/Met 11/02/2023 2233 by Sherleen Flor, RN Outcome: Progressing   Problem: Health Behavior/Discharge Planning: Goal: Ability to manage health-related needs will improve 11/03/2023 0910 by Sherleen Flor, RN Outcome: Completed/Met 11/02/2023 2233 by Sherleen Flor, RN Outcome: Progressing   Problem: Clinical Measurements: Goal: Ability to maintain clinical measurements within normal limits will improve 11/03/2023 0910 by Sherleen Flor, RN Outcome: Completed/Met 11/02/2023 2233 by Sherleen Flor, RN Outcome: Progressing Goal: Will remain free from infection 11/03/2023 0910 by Sherleen Flor, RN Outcome: Completed/Met 11/02/2023 2233 by Sherleen Flor, RN Outcome: Progressing Goal: Diagnostic test results will improve 11/03/2023 0910 by Sherleen Flor, RN Outcome: Completed/Met 11/02/2023 2233 by Sherleen Flor, RN Outcome: Progressing Goal: Respiratory complications will improve 11/03/2023 0910 by Sherleen Flor, RN Outcome: Completed/Met 11/02/2023 2233 by Sherleen Flor, RN Outcome: Progressing Goal: Cardiovascular complication will be avoided 11/03/2023 0910 by Sherleen Flor, RN Outcome: Completed/Met 11/02/2023 2233 by Sherleen Flor, RN Outcome: Progressing   Problem: Activity: Goal: Risk for activity intolerance will decrease 11/03/2023 0910 by Sherleen Flor, RN Outcome: Completed/Met 11/02/2023 2233 by Sherleen Flor, RN Outcome: Progressing   Problem: Nutrition: Goal: Adequate nutrition will be maintained 11/03/2023 0910 by Sherleen Flor, RN Outcome: Completed/Met 11/02/2023 2233 by Sherleen Flor, RN Outcome: Progressing   Problem: Coping: Goal: Level of anxiety will decrease 11/03/2023  0910 by Sherleen Flor, RN Outcome: Completed/Met 11/02/2023 2233 by Sherleen Flor, RN Outcome: Progressing   Problem: Elimination: Goal: Will not experience complications related to bowel motility 11/03/2023 0910 by Sherleen Flor, RN Outcome: Completed/Met 11/02/2023 2233 by Sherleen Flor, RN Outcome: Progressing Goal: Will not experience complications related to urinary retention 11/03/2023 0910 by Sherleen Flor, RN Outcome: Completed/Met 11/02/2023 2233 by Sherleen Flor, RN Outcome: Progressing   Problem: Pain Managment: Goal: General experience of comfort will improve and/or be controlled 11/03/2023 0910 by Sherleen Flor, RN Outcome: Completed/Met 11/02/2023 2233 by Sherleen Flor, RN Outcome: Progressing   Problem: Safety: Goal: Ability to remain free from injury will improve 11/03/2023 0910 by Sherleen Flor, RN Outcome: Completed/Met 11/02/2023 2233 by Sherleen Flor, RN Outcome: Progressing   Problem: Skin Integrity: Goal: Risk for impaired skin integrity will decrease 11/03/2023 0910 by Sherleen Flor, RN Outcome: Completed/Met 11/02/2023 2233 by Sherleen Flor, RN Outcome: Progressing   Problem: Education: Goal: Ability to verbalize activity precautions or restrictions will improve 11/03/2023 0910 by Sherleen Flor, RN Outcome: Completed/Met 11/02/2023 2233 by Sherleen Flor, RN Outcome: Progressing Goal: Knowledge of the prescribed therapeutic regimen will improve 11/03/2023 0910 by Sherleen Flor, RN Outcome: Completed/Met 11/02/2023 2233 by Sherleen Flor, RN Outcome: Progressing Goal: Understanding of discharge needs will improve 11/03/2023 0910 by Sherleen Flor, RN Outcome: Completed/Met 11/02/2023 2233 by Sherleen Flor, RN Outcome: Progressing   Problem: Activity: Goal: Ability to avoid complications of mobility impairment will improve 11/03/2023 0910 by Sherleen Flor, RN Outcome: Completed/Met 11/02/2023 2233 by  Sherleen Flor, RN Outcome: Progressing Goal: Ability to tolerate increased activity will improve 11/03/2023 0910 by Sherleen Flor, RN Outcome: Completed/Met 11/02/2023 2233 by Sherleen Flor, RN Outcome: Progressing Goal: Will remain free from falls 11/03/2023 0910 by Sherleen Flor, RN Outcome: Completed/Met 11/02/2023 2233 by Sherleen Flor, RN Outcome: Progressing   Problem: Bowel/Gastric: Goal: Gastrointestinal status for postoperative course will improve 11/03/2023 0910 by Sherleen Flor, RN Outcome: Completed/Met 11/02/2023 2233 by Sherleen Flor, RN Outcome: Progressing   Problem: Clinical Measurements: Goal: Ability to  maintain clinical measurements within normal limits will improve 11/03/2023 0910 by Sherleen Flor, RN Outcome: Completed/Met 11/02/2023 2233 by Sherleen Flor, RN Outcome: Progressing Goal: Postoperative complications will be avoided or minimized 11/03/2023 0910 by Sherleen Flor, RN Outcome: Completed/Met 11/02/2023 2233 by Sherleen Flor, RN Outcome: Progressing Goal: Diagnostic test results will improve 11/03/2023 0910 by Sherleen Flor, RN Outcome: Completed/Met 11/02/2023 2233 by Sherleen Flor, RN Outcome: Progressing   Problem: Pain Management: Goal: Pain level will decrease 11/03/2023 0910 by Sherleen Flor, RN Outcome: Completed/Met 11/02/2023 2233 by Sherleen Flor, RN Outcome: Progressing   Problem: Skin Integrity: Goal: Will show signs of wound healing 11/03/2023 0910 by Sherleen Flor, RN Outcome: Completed/Met 11/02/2023 2233 by Sherleen Flor, RN Outcome: Progressing   Problem: Health Behavior/Discharge Planning: Goal: Identification of resources available to assist in meeting health care needs will improve 11/03/2023 0910 by Sherleen Flor, RN Outcome: Completed/Met 11/02/2023 2233 by Sherleen Flor, RN Outcome: Progressing   Problem: Bladder/Genitourinary: Goal: Urinary functional status for  postoperative course will improve 11/03/2023 0910 by Sherleen Flor, RN Outcome: Completed/Met 11/02/2023 2233 by Sherleen Flor, RN Outcome: Progressing

## 2023-11-03 NOTE — Evaluation (Signed)
 Physical Therapy Evaluation Patient Details Name: Heidi Lutz MRN: [id number] DOB: 1940/04/10 Today's Date: 11/03/2023  History of Present Illness  Patient is a 83 year old female with recent C1-C2 fracture with hard collar, s/p C1-C2 fusion on 11/02/23.  PMH: HTN, HLD, breast cancer, symptomatic anemia.  Clinical Impression  Pt admitted with above diagnosis and presents to PT with functional limitations due to deficits listed below (See PT problem list). Pt needs skilled PT to maximize independence and safety. Pt with significant decline in function and activity over the past six weeks. Now with cervical fusion should be able to improve strength, mobility, and balance. Patient will benefit from continued inpatient follow up therapy, <3 hours/day.            If plan is discharge home, recommend the following: A little help with walking and/or transfers;A little help with bathing/dressing/bathroom;Assist for transportation;Help with stairs or ramp for entrance;Assistance with cooking/housework   Can travel by private vehicle   Yes    Equipment Recommendations None recommended by PT  Recommendations for Other Services       Functional Status Assessment Patient has had a recent decline in their functional status and demonstrates the ability to make significant improvements in function in a reasonable and predictable amount of time.     Precautions / Restrictions Precautions Precautions: Fall;Cervical Recall of Precautions/Restrictions: Intact Required Braces or Orthoses: Cervical Brace Cervical Brace: Hard collar;Other (comment) (Allowed off in bed, can mobilize to bathroom without) Restrictions Weight Bearing Restrictions Per Provider Order: No      Mobility  Bed Mobility Overal bed mobility: Needs Assistance Bed Mobility: Rolling, Sidelying to Sit, Sit to Sidelying Rolling: Contact guard assist, Used rails Sidelying to sit: Min assist, Used rails, HOB elevated      Sit to sidelying: Min assist General bed mobility comments: Assist to elevate trunk into sitting and bring hips to EOB. Assist to bring feet back up into bed.    Transfers Overall transfer level: Needs assistance Equipment used: Rollator (4 wheels), None Transfers: Sit to/from Stand Sit to Stand: Min assist, Mod assist           General transfer comment: Assist to power up and for balance. Initially min assist bur increased to mod assist with fatigue. Pt braces lower legs on bed to rise.    Ambulation/Gait Ambulation/Gait assistance: Min assist Gait Distance (Feet): 100 Feet Assistive device: Rollator (4 wheels) Gait Pattern/deviations: Step-through pattern, Decreased stride length, Trunk flexed Gait velocity: decreased Gait velocity interpretation: <1.31 ft/sec, indicative of household ambulator   General Gait Details: Assist for balance. Verbal cues to stand more erect and look up.  Stairs            Wheelchair Mobility     Tilt Bed    Modified Rankin (Stroke Patients Only)       Balance Overall balance assessment: Needs assistance Sitting-balance support: Feet supported Sitting balance-Leahy Scale: Good     Standing balance support: Bilateral upper extremity supported, Single extremity supported, During functional activity, Reliant on assistive device for balance Standing balance-Leahy Scale: Poor Standing balance comment: UE support and min assist for static standing                             Pertinent Vitals/Pain Pain Assessment Pain Assessment: Faces Faces Pain Scale: Hurts even more Pain Location: posterior neck Pain Descriptors / Indicators: Grimacing, Guarding Pain Intervention(s): Limited activity within patient's tolerance, Patient  requesting pain meds-RN notified, Monitored during session, Repositioned    Home Living Family/patient expects to be discharged to:: Private residence Living Arrangements: Spouse/significant  other Available Help at Discharge: Family;Available 24 hours/day Type of Home: Independent living facility         Home Layout: One level Home Equipment: Rollator (4 wheels);Shower seat;Grab bars - toilet;Grab bars - tub/shower;Wheelchair - manual Additional Comments: Independent living apartment at [facility].    Prior Function Prior Level of Function : Independent/Modified Independent;History of Falls (last six months)             Mobility Comments: Mod I with rollator prior to recent neck fracture. Family has been providing supervision/assistance for home ambulation since injury. Minimal activity since neck fracture. ADLs Comments: recently assistance required from family     Extremity/Trunk Assessment   Upper Extremity Assessment Upper Extremity Assessment: Defer to OT evaluation RUE Deficits / Details: Shoulder flexion to 90 degrees RUE Sensation: WNL RUE Coordination: WNL LUE Deficits / Details: Limited shoulder flexion - needs TSA LUE Sensation: WNL LUE Coordination: WNL    Lower Extremity Assessment Lower Extremity Assessment: Generalized weakness    Cervical / Trunk Assessment Cervical / Trunk Assessment: Neck Surgery;Kyphotic  Communication   Communication Communication: No apparent difficulties    Cognition Arousal: Alert Behavior During Therapy: WFL for tasks assessed/performed   PT - Cognitive impairments: No apparent impairments                         Following commands: Intact       Cueing Cueing Techniques: Verbal cues, Tactile cues     General Comments      Exercises     Assessment/Plan    PT Assessment Patient needs continued PT services  PT Problem List Decreased strength;Decreased range of motion;Decreased activity tolerance;Decreased balance;Decreased mobility;Pain       PT Treatment Interventions DME instruction;Gait training;Functional mobility training;Therapeutic exercise;Balance training;Therapeutic  activities;Neuromuscular re-education;Patient/family education    PT Goals (Current goals can be found in the Care Plan section)  Acute Rehab PT Goals Patient Stated Goal: to return to prior level of function PT Goal Formulation: With patient/family Time For Goal Achievement: 11/17/23 Potential to Achieve Goals: Good    Frequency Min 2X/week     Co-evaluation               AM-PAC PT 6 Clicks Mobility  Outcome Measure Help needed turning from your back to your side while in a flat bed without using bedrails?: A Little Help needed moving from lying on your back to sitting on the side of a flat bed without using bedrails?: A Little Help needed moving to and from a bed to a chair (including a wheelchair)?: A Little Help needed standing up from a chair using your arms (e.g., wheelchair or bedside chair)?: A Lot Help needed to walk in hospital room?: A Little Help needed climbing 3-5 steps with a railing? : Total 6 Click Score: 15    End of Session Equipment Utilized During Treatment: Cervical collar;Gait belt Activity Tolerance: Patient tolerated treatment well Patient left: in bed;with call bell/phone within reach;with family/visitor present Nurse Communication: Mobility status PT Visit Diagnosis: Muscle weakness (generalized) (M62.81);Unsteadiness on feet (R26.81);Pain Pain - part of body:  (neck)    Time: 1123-1140 PT Time Calculation (min) (ACUTE ONLY): 17 min   Charges:   PT Evaluation $PT Eval Moderate Complexity: 1 Mod PT Treatments $Gait Training: 8-22 mins PT General Charges $$ ACUTE  PT VISIT: 1 Visit         [facility] PT Acute Rehabilitation Services Office [phone]   [facility] ORN [facility] 11/03/2023, 11:58 AM

## 2023-11-03 NOTE — Evaluation (Signed)
 Occupational Therapy Evaluation Patient Details Name: Heidi Lutz MRN: [id number] DOB: Sep 04, 1940 Today's Date: 11/03/2023   History of Present Illness   Patient is a 83 year old female with recent C1-C2 fracture with hard collar, s/p C1-C2 instrumentation.  PMH: HTN, HLD, breast cancer, symptomatic anemia.     Clinical Impressions Patient admitted for the diagnosis above.  PTA she was not able to be as mobile as prior, and as a result, needing Min A for generalized mobility in her apartment and Min A for ADL completion.  Patient was Mod I to Independent prior to her fall and cervical fracture.  Patient has a SNF level bed at her ALF, and the patient will benefit from continued inpatient follow up therapy, <3 hours/day prior to returning to her apartment.  OT will continue efforts in the acute setting to address deficits.  Deficits impacting independence are listed below.       If plan is discharge home, recommend the following:   Assist for transportation;A little help with walking and/or transfers;A little help with bathing/dressing/bathroom     Functional Status Assessment   Patient has had a recent decline in their functional status and demonstrates the ability to make significant improvements in function in a reasonable and predictable amount of time.     Equipment Recommendations   None recommended by OT     Recommendations for Other Services         Precautions/Restrictions   Precautions Precautions: Fall;Cervical Precaution Booklet Issued: Yes (comment) Recall of Precautions/Restrictions: Intact Required Braces or Orthoses: Cervical Brace Cervical Brace: Hard collar;Other (comment) (Allowed off in bed, can mobilize to BR without collar.)     Mobility Bed Mobility Overal bed mobility: Needs Assistance Bed Mobility: Sidelying to Sit, Sit to Sidelying   Sidelying to sit: Min assist     Sit to sidelying: Min assist      Transfers Overall  transfer level: Needs assistance Equipment used: Rollator (4 wheels) Transfers: Sit to/from Stand, Bed to chair/wheelchair/BSC Sit to Stand: Contact guard assist, Min assist     Step pivot transfers: Contact guard assist            Balance Overall balance assessment: Needs assistance Sitting-balance support: Feet supported Sitting balance-Leahy Scale: Good     Standing balance support: Reliant on assistive device for balance Standing balance-Leahy Scale: Poor                             ADL either performed or assessed with clinical judgement   ADL Overall ADL's : Needs assistance/impaired Eating/Feeding: Independent;Sitting   Grooming: Wash/dry hands;Wash/dry face;Contact guard assist;Standing   Upper Body Bathing: Set up;Sitting   Lower Body Bathing: Minimal assistance;Sit to/from stand   Upper Body Dressing : Minimal assistance;Sitting   Lower Body Dressing: Minimal assistance;Sit to/from stand   Toilet Transfer: Contact guard assist;Minimal assistance;Rollator (4 wheels)                   Vision Patient Visual Report: No change from baseline       Perception Perception: Not tested       Praxis Praxis: Not tested       Pertinent Vitals/Pain Pain Assessment Pain Assessment: Faces Faces Pain Scale: Hurts little more Pain Location: posterior neck Pain Descriptors / Indicators: Discomfort, Grimacing Pain Intervention(s): Monitored during session     Extremity/Trunk Assessment Upper Extremity Assessment Upper Extremity Assessment: Generalized weakness;Right hand dominant;RUE deficits/detail;LUE deficits/detail RUE  Deficits / Details: Shoulder flexion to 90 degrees RUE Sensation: WNL RUE Coordination: WNL LUE Deficits / Details: Limited shoulder flexion - needs TSA LUE Sensation: WNL LUE Coordination: WNL   Lower Extremity Assessment Lower Extremity Assessment: Overall WFL for tasks assessed   Cervical / Trunk  Assessment Cervical / Trunk Assessment: Neck Surgery;Kyphotic   Communication Communication Communication: No apparent difficulties   Cognition Arousal: Alert Behavior During Therapy: WFL for tasks assessed/performed Cognition: No apparent impairments                               Following commands: Intact       Cueing  General Comments   Cueing Techniques: Verbal cues   VSS on RA   Exercises     Shoulder Instructions      Home Living Family/patient expects to be discharged to:: Private residence Living Arrangements: Spouse/significant other Available Help at Discharge: Family;Available 24 hours/day Type of Home: Apartment       Home Layout: One level         Bathroom Toilet: Handicapped height Bathroom Accessibility: Yes   Home Equipment: Rollator (4 wheels);Shower seat;Grab bars - toilet;Grab bars - tub/shower;Wheelchair - manual   Additional Comments: Indepedent Living apartment with her husband at [facility] . They can get extra help if needed and go to the clinic for advice and medical issues.      Prior Functioning/Environment Prior Level of Function : Independent/Modified Independent;History of Falls (last six months)             Mobility Comments: Mod I with rollator prior to recent neck fracture. Family has been providing supervision/assistance for home ambulation since injury ADLs Comments: recently assistance required from family    OT Problem List: Impaired balance (sitting and/or standing);Decreased activity tolerance;Decreased strength;Pain   OT Treatment/Interventions: Self-care/ADL training;Therapeutic activities;Patient/family education;Balance training;DME and/or AE instruction      OT Goals(Current goals can be found in the care plan section)   Acute Rehab OT Goals Patient Stated Goal: Return home OT Goal Formulation: With patient Time For Goal Achievement: 11/17/23 Potential to Achieve Goals: Good ADL Goals Pt  Will Perform Grooming: with modified independence;standing Pt Will Perform Upper Body Dressing: with modified independence;sitting Pt Will Perform Lower Body Dressing: with modified independence;sit to/from stand Pt Will Transfer to Toilet: with modified independence;ambulating;regular height toilet   OT Frequency:  Min 2X/week    Co-evaluation              AM-PAC OT 6 Clicks Daily Activity     Outcome Measure Help from another person eating meals?: None Help from another person taking care of personal grooming?: A Little Help from another person toileting, which includes using toliet, bedpan, or urinal?: A Little Help from another person bathing (including washing, rinsing, drying)?: A Little Help from another person to put on and taking off regular upper body clothing?: A Little Help from another person to put on and taking off regular lower body clothing?: A Little 6 Click Score: 19   End of Session Equipment Utilized During Treatment: Gait belt;Rollator (4 wheels) Nurse Communication: Mobility status  Activity Tolerance: Patient tolerated treatment well Patient left: in bed;with call bell/phone within reach;with family/visitor present  OT Visit Diagnosis: Unsteadiness on feet (R26.81);Muscle weakness (generalized) (M62.81)                Time: 9079-9050 OT Time Calculation (min): 29 min Charges:  OT General Charges $OT  Visit: 1 Visit OT Evaluation $OT Eval Moderate Complexity: 1 Mod OT Treatments $Self Care/Home Management : 8-22 mins  11/03/2023  RP, OTR/L  Acute Rehabilitation Services  Office:  [phone]   Heidi Lutz 11/03/2023, 10:00 AM

## 2023-11-03 NOTE — TOC Transition Note (Signed)
 Transition of Care [facility]) - Discharge Note   Patient Details  Name: Heidi Lutz MRN: [id number] Date of Birth: 1940-06-14  [facility] CM/SW Contact:  Almarie CHRISTELLA Goodie, LCSW Phone Number: 11/03/2023, 2:19 PM   Clinical Narrative:   CSW completed workup for SNF and contacted CMA to request insurance authorization. Authorization remains pending at this time, but family does not want to wait; they can use the patient's healthcare days. [facility] is able to accept. Discharge information sent to [facility], medication sent to [facility]. Family will provide transportation. No further inpatient care management needs.    Final next level of care: Skilled Nursing Facility Barriers to Discharge: Barriers Resolved   Patient Goals and CMS Choice Patient states their goals for this hospitalization and ongoing recovery are:: wants to recover CMS Medicare.gov Compare Post Acute Care list provided to:: Patient Choice offered to / list presented to : Patient      Discharge Placement              Patient chooses bed at: [facility] at [facility] Patient to be transferred to facility by: Family Name of family member notified: Self, Spouse, Daughter Patient and family notified of of transfer: 11/03/23  Discharge Plan and Services Additional resources added to the After Visit Summary for     Discharge Planning Services: CM Consult Post Acute Care Choice: Skilled Nursing Facility                               Social Drivers of Health (SDOH) Interventions SDOH Screenings   Food Insecurity: No Food Insecurity (10/19/2023)  Housing: Low Risk  (10/19/2023)  Transportation Needs: No Transportation Needs (10/19/2023)  Utilities: Not At Risk (10/19/2023)  Depression (PHQ2-9): Low Risk  (03/20/2020)  Financial Resource Strain: Low Risk  (05/03/2021)   Received from [facility]  Physical Activity: Insufficiently Active (05/03/2021)   Received from [facility]  Social  Connections: Unknown (10/19/2023)  Stress: No Stress Concern Present (05/03/2021)   Received from [facility]  Tobacco Use: Medium Risk (11/02/2023)     Readmission Risk Interventions     No data to display

## 2023-11-03 NOTE — NC FL2 (Signed)
 [place]  MEDICAID FL2 LEVEL OF CARE FORM     IDENTIFICATION  Patient Name: Heidi Lutz Birthdate: 02/17/1941 Sex: female Admission Date (Current Location): 11/02/2023  [facility] and [place] Number:  Producer, television/film/video and Address:  [facility] [place], [address], [place], [place] [address]      Provider Number: [id number]  Attending Physician Name and Address:  Mavis Purchase, MD  Relative Name and Phone Number:       Current Level of Care: Hospital Recommended Level of Care: Skilled Nursing Facility Prior Approval Number:    Date Approved/Denied:   PASRR Number: [id number]  Discharge Plan: SNF    Current Diagnoses: Patient Active Problem List   Diagnosis Date Noted   Closed C2 fracture (HCC) 11/02/2023   Acute encephalopathy 10/18/2023   Symptomatic anemia 09/22/2023   Aortic atherosclerosis (HCC) 08/16/2022   Cervical spondylosis 07/13/2022   History of UTI 05/25/2022   Urge incontinence 05/25/2022   Rheumatoid arthritis (HCC)    Raynaud disease    PONV (postoperative nausea and vomiting)    Personal history of radiation therapy    Osteoarthritis    High cholesterol    Environmental allergies    Complication of anesthesia    OAB (overactive bladder) 02/2020   Age-related osteoporosis without current pathological fracture 10/31/2019   History of vertebral compression fracture 07/16/2019   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   Pleuritic chest pain 04/10/2018   GAD (generalized anxiety disorder) 04/25/2017   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   LBBB (left bundle branch block) 03/22/2017   Pre-operative cardiovascular examination 03/22/2017   Mixed dyslipidemia 03/22/2017   Mixed hyperlipidemia 03/22/2017   Malignant neoplasm of central portion of left breast in female, estrogen receptor positive (HCC) 03/16/2017   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/2017   Osteopenia 03/03/2017   Cancer (HCC) 01/2017    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   Lumbar degenerative disc disease 02/27/2015   Lumbar spinal stenosis 02/27/2015   Osteoarthritis of spine with radiculopathy, lumbar region 02/27/2015   Basal cell carcinoma 12/15/2014   Closed compression fracture of L1 lumbar vertebra, with routine healing, subsequent encounter 11/26/2013   Insomnia 02/02/2010   Hyperlipidemia 04/22/2009   Essential hypertension 09/19/2008   Allergic rhinitis 07/31/2007   IBS (irritable bowel syndrome) 08/18/2006   Raynaud's disease 08/18/2006    Orientation RESPIRATION BLADDER Height & Weight     Self, Time, Situation, Place  Normal Continent Weight: 118 lb (53.5 kg) Height:  5' 2 (157.5 cm)  BEHAVIORAL SYMPTOMS/MOOD NEUROLOGICAL BOWEL NUTRITION STATUS      Continent Diet (regular)  AMBULATORY STATUS COMMUNICATION OF NEEDS Skin   Limited Assist Verbally PU Stage and Appropriate Care, Surgical wounds (closed head, honeycomb dressing)     PU Stage 3 Dressing: Daily (right pelvis, foam dressing)                 Personal Care Assistance Level of Assistance  Bathing, Feeding, Dressing Bathing Assistance: Limited assistance Feeding assistance: Independent Dressing Assistance: Limited assistance     Functional Limitations Info             SPECIAL CARE FACTORS FREQUENCY  PT (By licensed PT), OT (By licensed OT)     PT Frequency: 5x/wk OT Frequency: 5x/wk            Contractures Contractures Info: Not present    Additional Factors Info  Code Status, Allergies Code Status Info: Full Allergies Info:  Fluorouracil, Other, Clavulanic Acid, Codeine, Hydrocodone , Penicillins           Current Medications (11/03/2023):  This is the current hospital active medication list Current Facility-Administered Medications  Medication Dose Route Frequency Provider Last Rate Last Admin   acetaminophen  (TYLENOL ) tablet 650 mg  650 mg Oral Q4H PRN Mavis Purchase, MD        Or   acetaminophen  (TYLENOL ) suppository 650 mg  650 mg Rectal Q4H PRN Mavis Purchase, MD       albuterol  (PROVENTIL ) (2.5 MG/3ML) 0.083% nebulizer solution 3 mL  3 mL Inhalation Q6H PRN Mavis Purchase, MD       atorvastatin  (LIPITOR) tablet 20 mg  20 mg Oral QHS Mavis Purchase, MD   20 mg at 11/02/23 2117   bisacodyl  (DULCOLAX) suppository 10 mg  10 mg Rectal Daily PRN Mavis Purchase, MD       budesonide -glycopyrrolate -formoterol  (BREZTRI ) 160-9-4.8 MCG/ACT inhaler 2 puff  2 puff Inhalation BID Mavis Purchase, MD   2 puff at 11/02/23 2117   Chlorhexidine  Gluconate Cloth 2 % PADS 6 each  6 each Topical Daily Mavis Purchase, MD   6 each at 11/03/23 1058   cyclobenzaprine  (FLEXERIL ) tablet 5 mg  5 mg Oral TID PRN Mavis Purchase, MD       docusate sodium  (COLACE) capsule 100 mg  100 mg Oral BID Mavis Purchase, MD   100 mg at 11/02/23 2117   famotidine  (PEPCID ) tablet 20 mg  20 mg Oral Daily PRN Mavis Purchase, MD       fesoterodine  (TOVIAZ ) tablet 4 mg  4 mg Oral Daily Mavis Purchase, MD   4 mg at 11/03/23 1107   Finerenone  TABS 10 mg  10 mg Oral q AM Mavis Purchase, MD       HYDROcodone -acetaminophen  (NORCO/VICODIN) 5-325 MG per tablet 1-2 tablet  1-2 tablet Oral Q4H PRN Mavis Purchase, MD   1 tablet at 11/02/23 1847   lactated ringers  infusion   Intravenous Continuous Mavis Purchase, MD 75 mL/hr at 11/02/23 1837 New Bag at 11/02/23 1837   leptospermum manuka honey (MEDIHONEY) paste 1 Application  1 Application Topical Daily Mavis Purchase, MD   1 Application at 11/03/23 1059   liver oil-zinc  oxide (DESITIN) 40 % ointment   Topical PRN Mavis Purchase, MD       loratadine  (CLARITIN ) tablet 10 mg  10 mg Oral Daily Mavis Purchase, MD   10 mg at 11/02/23 1335   menthol  (CEPACOL) lozenge 3 mg  1 lozenge Oral PRN Mavis Purchase, MD       Or   phenol (CHLORASEPTIC) mouth spray 1 spray  1 spray Mouth/Throat PRN Mavis Purchase, MD       morphine  (PF) 2 MG/ML injection 2-4  mg  2-4 mg Intravenous Q2H PRN Mavis Purchase, MD       mupirocin  ointment (BACTROBAN ) 2 % 1 Application  1 Application Nasal BID Mavis Purchase, MD       NIFEdipine  (PROCARDIA -XL/NIFEDICAL-XL) 24 hr tablet 90 mg  90 mg Oral Daily Mavis Purchase, MD       ondansetron  (ZOFRAN ) tablet 4 mg  4 mg Oral Q6H PRN Mavis Purchase, MD       Or   ondansetron  (ZOFRAN ) injection 4 mg  4 mg Intravenous Q6H PRN Mavis Purchase, MD       pregabalin  (LYRICA ) capsule 100 mg  100 mg Oral BID Mavis Purchase, MD   100 mg at 11/02/23 2117   tamoxifen  (NOLVADEX ) tablet 20 mg  20  mg Oral Daily Mavis Purchase, MD   20 mg at 11/03/23 1107   zolpidem  (AMBIEN ) tablet 5 mg  5 mg Oral QHS PRN Mavis Purchase, MD         Discharge Medications: Please see discharge summary for a list of discharge medications.  Relevant Imaging Results:  Relevant Lab Results:   Additional Information SS#: [id number]  Almarie CHRISTELLA Goodie, LCSW

## 2023-11-03 NOTE — Discharge Summary (Signed)
 Physician Discharge Summary     Providing Compassionate, Quality Care - Together   Patient ID: Heidi Lutz MRN: [id number] DOB/AGE: 09/20/1940 83 y.o.  Admit date: 11/02/2023 Discharge date: 11/03/2023  Admission Diagnoses: Closed C2 fracture  Discharge Diagnoses:  Principal Problem:   Closed C2 fracture [facility])   Discharged Condition: good  Hospital Course: Patient underwent a posterior C1-2 instrumentation by Dr. Mavis on 11/02/2023. She was admitted to 3C08 following recovery from anesthesia in the PACU. Her postoperative course has been uncomplicated. She has worked with both physical and occupational therapies who feel the patient is ready for discharge home. She is ambulating with assistance. She is tolerating a normal diet. Her pain is reasonably controlled with oral pain medication. She is ready for discharge home.   Consults: PT/OT/TOC  Significant Diagnostic Studies: radiology: DG O-ARM IMAGE ONLY/NO REPORT Result Date: 11/02/2023 There is no Radiologist interpretation  for this exam.  DG Cervical Spine 2 or 3 views Result Date: 11/02/2023 CLINICAL DATA:  Surgical localization. EXAM: CERVICAL SPINE - 2-3 VIEW COMPARISON:  October 24, 2023. FINDINGS: Two intraoperative cross-table lateral projections were obtained of the cervical spine. Only the first 5 cervical vertebra are noted. 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 IMPRESSION: Limited exam as only the first 5 cervical vertebra are visualized. 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 Electronically Signed   By: Lynwood Landy Raddle M.D.   On: 11/02/2023 11:43     Treatments: surgery: Posterior C1-C2 instrumentation with Zimmer titanium lateral mass and pars screws; C1-2 posterior arthrodesis with local autograft bone and intra grow bone graft extender; application of spinal navigation/O-arm   Discharge Exam: Blood pressure (!) 114/54, pulse 84, temperature 98.4 F (36.9 C), temperature source  Oral, resp. rate 19, height 5' 2 (1.575 m), weight 53.5 kg, SpO2 99%.  Per report: Alert and oriented MAE Incision is covered with Honeycomb. Dressing with small amount of serosanguinous drainage.  Disposition: Discharge disposition: 01-Home or Self Care       Discharge Instructions     Call MD for:  difficulty breathing, headache or visual disturbances   Complete by: As directed    Call MD for:  hives   Complete by: As directed    Call MD for:  persistant nausea and vomiting   Complete by: As directed    Call MD for:  redness, tenderness, or signs of infection (pain, swelling, redness, odor or green/yellow discharge around incision site)   Complete by: As directed    Call MD for:  severe uncontrolled pain   Complete by: As directed    Diet - low sodium heart healthy   Complete by: As directed    If the dressing is still on your incision site when you go home, remove it on the third day after your surgery date. Remove dressing if it begins to fall off, or if it is dirty or damaged before the third day.   Complete by: As directed    Increase activity slowly   Complete by: As directed       Allergies as of 11/03/2023       Reactions   Fluorouracil Hives   Other Hives, Itching, Swelling, Rash, Other (See Comments)   NO -CILLIN(s)   Clavulanic Acid Other (See Comments)   Clavulanic acid is a medication that can be used in conjunction with amoxicillin to manage and treat bacterial infections, specifically bacteria that are beta-lactamase producers. It is in the beta-lactamase inhibitor  class of medications. Reaction undefined, but is allergic if it is paired with any -cillin(s)   Codeine Nausea And Vomiting   Hydrocodone  Nausea And Vomiting   Penicillins Hives, Swelling, Rash, Other (See Comments)   Pt states allergic to all cillin drugs. Joint pain.        Medication List     STOP taking these medications    traMADol  50 MG tablet Commonly known as: ULTRAM         TAKE these medications    albuterol  108 (90 Base) MCG/ACT inhaler Commonly known as: VENTOLIN  HFA Inhale 2 puffs into the lungs every 6 (six) hours as needed for wheezing or shortness of breath.   atorvastatin  20 MG tablet Commonly known as: LIPITOR Take 20 mg by mouth at bedtime.   Black Cohosh 540 MG Caps Take 540 mg by mouth daily.   Breztri  Aerosphere 160-9-4.8 MCG/ACT Aero inhaler Generic drug: budesonide -glycopyrrolate -formoterol  Inhale 2 puffs into the lungs 2 (two) times daily.   CALCIUM  + D3 PO Take 1 tablet by mouth 2 (two) times daily.   cefadroxil  500 MG capsule Commonly known as: DURICEF Take 1 capsule (500 mg total) by mouth daily.   clobetasol cream 0.05 % Commonly known as: TEMOVATE Apply 1 Application topically See admin instructions. Apply to affected areas 2 times a day   CRANBERRY PO Take 1 capsule by mouth daily.   cyclobenzaprine  5 MG tablet Commonly known as: FLEXERIL  Take 1 tablet (5 mg total) by mouth 3 (three) times daily as needed for muscle spasms.   docusate sodium  100 MG capsule Commonly known as: COLACE Take 1 capsule (100 mg total) by mouth 2 (two) times daily.   estradiol  0.1 MG/GM vaginal cream Commonly known as: ESTRACE  Place 1 Applicatorful vaginally at bedtime.   famotidine  40 MG tablet Commonly known as: PEPCID  Take 0.5 tablets (20 mg total) by mouth daily as needed for heartburn or indigestion.   fosfomycin 3 g Pack Commonly known as: MONUROL  Take 1 packet by mouth every 10 days What changed:  how much to take how to take this when to take this additional instructions   Glucosamine-Chondroitin Max St Caps Take 1 capsule by mouth daily.   HYDROcodone -acetaminophen  5-325 MG tablet Commonly known as: NORCO/VICODIN Take 1-2 tablets by mouth every 6 (six) hours as needed for moderate pain (pain score 4-6) or severe pain (pain score 7-10). What changed:  how much to take when to take this reasons to take this    Kerendia  10 MG Tabs Generic drug: Finerenone  Take 10 mg by mouth in the morning.   leptospermum manuka honey Pste paste Apply 1 Application topically daily. Start taking on: November 04, 2023   lidocaine  5 % Commonly known as: LIDODERM  APPLY 2 PATCHES TO SKIN EVERY DAY, REMOVE AND REPLACE PATCH AFTER 12 HRS What changed: See the new instructions.   loratadine  10 MG tablet Commonly known as: CLARITIN  Take 10 mg by mouth daily.   Magnesium  200 MG Tabs Take 200 mg by mouth daily.   multivitamin Tabs tablet Take 1 tablet by mouth daily.   mupirocin  ointment 2 % Commonly known as: BACTROBAN  Place 1 Application into the nose 2 (two) times daily.   NIFEdipine  90 MG 24 hr tablet Commonly known as: PROCARDIA  XL/NIFEDICAL-XL Take 90 mg by mouth daily.   ondansetron  4 MG tablet Commonly known as: ZOFRAN  Take 1 tablet (4 mg total) by mouth every 6 (six) hours as needed for nausea or vomiting.   pregabalin  50 MG  capsule Commonly known as: LYRICA  Take 100 mg by mouth 2 (two) times daily.   PROBIOTIC ADVANCED PO Take 1 capsule by mouth daily.   Prolia 60 MG/ML Sosy injection Generic drug: denosumab Inject 60 mg into the skin every 6 (six) months.   silver sulfADIAZINE 1 % cream Commonly known as: SILVADENE Apply 1 Application topically daily.   solifenacin  10 MG tablet Commonly known as: VESICARE  Take 1 tablet (10 mg total) by mouth every evening.   tamoxifen  20 MG tablet Commonly known as: NOLVADEX  TAKE 1 TABLET EVERY DAY What changed: when to take this   Tylenol  8 Hour Arthritis Pain 650 MG CR tablet Generic drug: acetaminophen  Take 1,300 mg by mouth daily as needed for pain.               Discharge Care Instructions  (From admission, onward)           Start     Ordered   11/03/23 0000  If the dressing is still on your incision site when you go home, remove it on the third day after your surgery date. Remove dressing if it begins to fall off, or if  it is dirty or damaged before the third day.        11/03/23 1409            Contact information for follow-up providers     Mavis Purchase, MD. Go on 11/24/2023.   Specialty: Neurosurgery Why: First post op appointment with x-rays is on 11/24/2023 at 8:30 AM. Contact information: [address] [place] [address] [phone]              Contact information for after-discharge care     Destination     [facility] .   Service: Skilled Nursing Contact information: [address] [place]  [address] [phone]                     Signed: Gerard Beck, DNP, AGNP-C Nurse Practitioner  [facility] [address], [place], [place] [address] P: [phone]    F: [phone]  11/03/2023, 2:10 PM

## 2023-11-07 ENCOUNTER — Encounter (HOSPITAL_COMMUNITY): Payer: Self-pay | Admitting: Neurosurgery

## 2023-11-13 ENCOUNTER — Ambulatory Visit: Admitting: Urology

## 2023-11-15 ENCOUNTER — Telehealth: Payer: Self-pay | Admitting: *Deleted

## 2023-11-15 NOTE — Telephone Encounter (Signed)
 Received call from Nat, RN at [facility] [phone]) stating pt family would like for upcoming labs be drawn at the facility so pt does not have to set up transportation here for lab work.  RN gave verbal orders over the phone per MD request for CBC with diff, iron and iron binding, as well as ferritin labs to be drawn.  Nat Verbalized understanding and states labs will be drawn 11/17/23 and a copy will be given to pt to bring to appt with MD on 11/22/23.

## 2023-11-17 ENCOUNTER — Other Ambulatory Visit: Payer: Self-pay | Admitting: *Deleted

## 2023-11-17 DIAGNOSIS — C50112 Malignant neoplasm of central portion of left female breast: Secondary | ICD-10-CM

## 2023-11-20 ENCOUNTER — Inpatient Hospital Stay

## 2023-11-22 ENCOUNTER — Encounter: Payer: Self-pay | Admitting: Hematology and Oncology

## 2023-11-22 ENCOUNTER — Inpatient Hospital Stay

## 2023-11-22 ENCOUNTER — Inpatient Hospital Stay: Attending: Hematology and Oncology | Admitting: Hematology and Oncology

## 2023-11-22 ENCOUNTER — Other Ambulatory Visit: Payer: Self-pay | Admitting: *Deleted

## 2023-11-22 VITALS — BP 110/45 | HR 106 | Temp 98.0°F | Resp 16 | Ht 62.0 in | Wt 102.5 lb

## 2023-11-22 DIAGNOSIS — C50112 Malignant neoplasm of central portion of left female breast: Secondary | ICD-10-CM | POA: Insufficient documentation

## 2023-11-22 DIAGNOSIS — Z79899 Other long term (current) drug therapy: Secondary | ICD-10-CM | POA: Insufficient documentation

## 2023-11-22 DIAGNOSIS — D649 Anemia, unspecified: Secondary | ICD-10-CM

## 2023-11-22 DIAGNOSIS — Z170 Estrogen receptor positive status [ER+]: Secondary | ICD-10-CM | POA: Diagnosis not present

## 2023-11-22 DIAGNOSIS — D4621 Refractory anemia with excess of blasts 1: Secondary | ICD-10-CM | POA: Diagnosis not present

## 2023-11-22 LAB — CBC WITH DIFFERENTIAL (CANCER CENTER ONLY)
Abs Immature Granulocytes: 0.02 K/uL (ref 0.00–0.07)
Basophils Absolute: 0.1 K/uL (ref 0.0–0.1)
Basophils Relative: 1 %
Eosinophils Absolute: 0.2 K/uL (ref 0.0–0.5)
Eosinophils Relative: 4 %
HCT: 25 % — ABNORMAL LOW (ref 36.0–46.0)
Hemoglobin: 8 g/dL — ABNORMAL LOW (ref 12.0–15.0)
Immature Granulocytes: 0 %
Lymphocytes Relative: 39 %
Lymphs Abs: 2 K/uL (ref 0.7–4.0)
MCH: 34 pg (ref 26.0–34.0)
MCHC: 32 g/dL (ref 30.0–36.0)
MCV: 106.4 fL — ABNORMAL HIGH (ref 80.0–100.0)
Monocytes Absolute: 0.7 K/uL (ref 0.1–1.0)
Monocytes Relative: 13 %
Neutro Abs: 2.2 K/uL (ref 1.7–7.7)
Neutrophils Relative %: 43 %
Platelet Count: 188 K/uL (ref 150–400)
RBC: 2.35 MIL/uL — ABNORMAL LOW (ref 3.87–5.11)
RDW: 19.9 % — ABNORMAL HIGH (ref 11.5–15.5)
WBC Count: 5.2 K/uL (ref 4.0–10.5)
nRBC: 0 % (ref 0.0–0.2)

## 2023-11-22 LAB — CMP (CANCER CENTER ONLY)
ALT: 5 U/L (ref 0–44)
AST: 10 U/L — ABNORMAL LOW (ref 15–41)
Albumin: 3.8 g/dL (ref 3.5–5.0)
Alkaline Phosphatase: 43 U/L (ref 38–126)
Anion gap: 7 (ref 5–15)
BUN: 59 mg/dL — ABNORMAL HIGH (ref 8–23)
CO2: 30 mmol/L (ref 22–32)
Calcium: 13.8 mg/dL (ref 8.9–10.3)
Chloride: 100 mmol/L (ref 98–111)
Creatinine: 1.53 mg/dL — ABNORMAL HIGH (ref 0.44–1.00)
GFR, Estimated: 33 mL/min — ABNORMAL LOW (ref >60)
Glucose, Bld: 126 mg/dL — ABNORMAL HIGH (ref 70–99)
Potassium: 4.3 mmol/L (ref 3.5–5.1)
Sodium: 137 mmol/L (ref 135–145)
Total Bilirubin: 0.4 mg/dL (ref 0.0–1.2)
Total Protein: 7.2 g/dL (ref 6.5–8.1)

## 2023-11-22 LAB — IRON AND IRON BINDING CAPACITY (CC-WL,HP ONLY)
Iron: 63 ug/dL (ref 28–170)
Saturation Ratios: 27 % (ref 10.4–31.8)
TIBC: 234 ug/dL — ABNORMAL LOW (ref 250–450)
UIBC: 171 ug/dL (ref 148–442)

## 2023-11-22 LAB — SAMPLE TO BLOOD BANK

## 2023-11-22 LAB — FERRITIN: Ferritin: 1213 ng/mL — ABNORMAL HIGH (ref 11–307)

## 2023-11-22 LAB — PREPARE RBC (CROSSMATCH)

## 2023-11-22 LAB — VITAMIN B12: Vitamin B-12: 895 pg/mL (ref 180–914)

## 2023-11-22 MED ORDER — ACETAMINOPHEN 325 MG PO TABS
650.0000 mg | ORAL_TABLET | Freq: Once | ORAL | Status: AC
  Administered 2023-11-22: 650 mg via ORAL
  Filled 2023-11-22: qty 2

## 2023-11-22 MED ORDER — DIPHENHYDRAMINE HCL 25 MG PO CAPS
25.0000 mg | ORAL_CAPSULE | Freq: Once | ORAL | Status: AC
  Administered 2023-11-22: 25 mg via ORAL
  Filled 2023-11-22: qty 1

## 2023-11-22 MED ORDER — SODIUM CHLORIDE 0.9 % IV SOLN: INTRAVENOUS | Status: DC

## 2023-11-22 MED ORDER — ZOLEDRONIC ACID 4 MG/5ML IV CONC
3.0000 mg | Freq: Once | INTRAVENOUS | Status: AC
  Administered 2023-11-22: 3 mg via INTRAVENOUS
  Filled 2023-11-22: qty 3.75

## 2023-11-22 MED ORDER — SODIUM CHLORIDE 0.9 % IV SOLN: Freq: Once | INTRAVENOUS | Status: AC

## 2023-11-22 NOTE — Progress Notes (Signed)
 Patient Care Team: Burney Darice CROME, MD as PCP - General (Family Medicine) Vanderbilt Ned, MD as Consulting Physician (General Surgery)  DIAGNOSIS:  Encounter Diagnoses  Name Primary?   Malignant neoplasm of central portion of left breast in female, estrogen receptor positive (HCC) Yes   Symptomatic anemia    Hypercalcemia     SUMMARY OF ONCOLOGIC HISTORY: Oncology History  Malignant neoplasm of central portion of left breast in female, estrogen receptor positive (HCC)  03/08/2017 Initial Diagnosis   Central left breast biopsy for a clinical T1b N0, stage IA i IDC, grade 1, e ER/PR positive, HER-2 not amplified, with an Ki-67 of 2%   03/16/2017 Cancer Staging   Staging form: Breast, AJCC 8th Edition - Clinical stage from 03/16/2017: Stage IA (cT1b, cN0, cM0, G1, ER+, PR+, HER2-) - Signed by Odean Potts, MD on 04/19/2022 Method of lymph node assessment: Clinical Histologic grading system: 3 grade system Laterality: Left Tumor size (mm): 8   04/07/2017 Surgery   left breast lumpectomy with sentinel lymph node sampling  pT1b pN0, stage IA invasive ductal carcinoma, grade II, with negative margins, total 1 lymph node removed from the axilla     CHIEF COMPLIANT: Follow-up of anemia (recent hospitalization)  HISTORY OF PRESENT ILLNESS:  History of Present Illness Heidi Lutz is an 83 year old female who presents with anemia and recent falls.  She experienced a fall at the end of July, resulting in two fractured vertebrae in her neck, followed by surgery. During rehab, she had a near-fall, attributed to low blood pressure. An emergency room visit revealed low hemoglobin levels. She received two blood transfusions, with hemoglobin levels recorded at 10.2 upon discharge, dropping to 7.5 and then 7.1. She feels stronger post-transfusion, though the effect is short-lived.  She is experiencing weight loss due to decreased appetite. She takes Renafite, a B12 supplement, and  receives nutritional support with ProSure, which she finds unpalatable. Her caregiver is concerned about her nutritional intake and its impact on her health. She has a stage three bedsore on her right buttock, which is being managed.     ALLERGIES:  is allergic to fluorouracil, other, clavulanic acid, codeine, hydrocodone , and penicillins.  MEDICATIONS:  Current Outpatient Medications  Medication Sig Dispense Refill   albuterol  (VENTOLIN  HFA) 108 (90 Base) MCG/ACT inhaler Inhale 2 puffs into the lungs every 6 (six) hours as needed for wheezing or shortness of breath.     atorvastatin  (LIPITOR) 20 MG tablet Take 20 mg by mouth at bedtime.     Black Cohosh 540 MG CAPS Take 540 mg by mouth daily.      Calcium  Carb-Cholecalciferol (CALCIUM  + D3 PO) Take 1 tablet by mouth 2 (two) times daily.     clobetasol cream (TEMOVATE) 0.05 % Apply 1 Application topically See admin instructions. Apply to affected areas 2 times a day     CRANBERRY PO Take 1 capsule by mouth daily.      cyclobenzaprine  (FLEXERIL ) 5 MG tablet Take 1 tablet (5 mg total) by mouth 3 (three) times daily as needed for muscle spasms.     denosumab (PROLIA) 60 MG/ML SOSY injection Inject 60 mg into the skin every 6 (six) months.     docusate sodium  (COLACE) 100 MG capsule Take 1 capsule (100 mg total) by mouth 2 (two) times daily.     estradiol  (ESTRACE ) 0.1 MG/GM vaginal cream Place 1 Applicatorful vaginally at bedtime.     famotidine  (PEPCID ) 40 MG tablet Take 0.5 tablets (  20 mg total) by mouth daily as needed for heartburn or indigestion.     fosfomycin (MONUROL ) 3 g PACK Take 1 packet by mouth every 10 days 9 g 6   Glucosamine-Chondroit-Vit C-Mn (GLUCOSAMINE-CHONDROITIN MAX ST) CAPS Take 1 capsule by mouth daily.      HYDROcodone -acetaminophen  (NORCO/VICODIN) 5-325 MG tablet Take 1-2 tablets by mouth every 6 (six) hours as needed for moderate pain (pain score 4-6) or severe pain (pain score 7-10). 40 tablet 0   KERENDIA  10 MG TABS  Take 10 mg by mouth in the morning.     loratadine  (CLARITIN ) 10 MG tablet Take 10 mg by mouth daily.     Magnesium  200 MG TABS Take 200 mg by mouth daily.      multivitamin (RENA-VIT) TABS tablet Take 1 tablet by mouth daily.     mupirocin  ointment (BACTROBAN ) 2 % Place 1 Application into the nose 2 (two) times daily.     NIFEdipine  (PROCARDIA  XL/ADALAT -CC) 90 MG 24 hr tablet Take 90 mg by mouth daily.      ondansetron  (ZOFRAN ) 4 MG tablet Take 1 tablet (4 mg total) by mouth every 6 (six) hours as needed for nausea or vomiting.     pregabalin  (LYRICA ) 50 MG capsule Take 100 mg by mouth 2 (two) times daily.     Probiotic Product (PROBIOTIC ADVANCED PO) Take 1 capsule by mouth daily.      solifenacin  (VESICARE ) 10 MG tablet Take 1 tablet (10 mg total) by mouth every evening. 90 tablet 3   tamoxifen  (NOLVADEX ) 20 MG tablet TAKE 1 TABLET EVERY DAY 90 tablet 3   TYLENOL  8 HOUR ARTHRITIS PAIN 650 MG CR tablet Take 1,300 mg by mouth daily as needed for pain.     BREZTRI  AEROSPHERE 160-9-4.8 MCG/ACT AERO inhaler Inhale 2 puffs into the lungs 2 (two) times daily. (Patient not taking: Reported on 11/22/2023)     lidocaine  (LIDODERM ) 5 % APPLY 2 PATCHES TO SKIN EVERY DAY, REMOVE AND REPLACE PATCH AFTER 12 HRS (Patient taking differently: Place 2 patches onto the skin daily as needed (pain).) 180 patch 0   No current facility-administered medications for this visit.   Facility-Administered Medications Ordered in Other Visits  Medication Dose Route Frequency Provider Last Rate Last Admin   0.9 %  sodium chloride  infusion   Intravenous Continuous Delta Pichon, MD       zoledronic acid (ZOMETA) 3 mg in sodium chloride  0.9 % 100 mL IVPB  3 mg Intravenous Once Charlot Gouin, MD        PHYSICAL EXAMINATION: ECOG PERFORMANCE STATUS: 3  Vitals:   11/22/23 1006  BP: (!) 110/45  Pulse: (!) 106  Resp: 16  Temp: 98 F (36.7 C)  SpO2: 100%   Filed Weights   11/22/23 1006  Weight: 102 lb 8 oz (46.5 kg)      LABORATORY DATA:  I have reviewed the data as listed    Latest Ref Rng & Units 11/22/2023    9:36 AM 11/02/2023    6:47 AM 10/20/2023    2:58 AM  CMP  Glucose 70 - 99 mg/dL 873  893  895   BUN 8 - 23 mg/dL 59  29  22   Creatinine 0.44 - 1.00 mg/dL 8.46  8.79  8.68   Sodium 135 - 145 mmol/L 137  137  138   Potassium 3.5 - 5.1 mmol/L 4.3  4.0  3.6   Chloride 98 - 111 mmol/L 100  103  103  CO2 22 - 32 mmol/L 30   24   Calcium  8.9 - 10.3 mg/dL 86.1   8.5   Total Protein 6.5 - 8.1 g/dL 7.2     Total Bilirubin 0.0 - 1.2 mg/dL 0.4     Alkaline Phos 38 - 126 U/L 43     AST 15 - 41 U/L 10     ALT 0 - 44 U/L 5       Lab Results  Component Value Date   WBC 5.2 11/22/2023   HGB 8.0 (L) 11/22/2023   HCT 25.0 (L) 11/22/2023   MCV 106.4 (H) 11/22/2023   PLT 188 11/22/2023   NEUTROABS 2.2 11/22/2023    ASSESSMENT & PLAN:  Malignant neoplasm of central portion of left breast in female, estrogen receptor positive (HCC) 04/07/2017:left breast lumpectomy with sentinel lymph node sampling  pT1b pN0, stage IA invasive ductal carcinoma, grade II, with negative margins, total 1 lymph node removed from the axilla, ER 100%, PR 70%, HER2 negative ratio 1.39, Ki-67 2% adjuvant radiation 05/10/2017 - 06/06/2017   Current treatment: Tamoxifen  started 03/17/2017 Plan of treatment: 10 years (patient preference)   Breast cancer surveillance: 1.  Breast MRI 11/11/2019: No evidence of malignancy  2. mammogram 06/02/2023: Benign breast density category C 3.  Breast exam 11/22/2023: Benign, radiation changes   Hospitalization 11/02/2023-11/03/2023: Closed C2 fracture  Severe anemia: Previously 2 units of PRBC was given in the hospital Today's hemoglobin is 8: I recommended 1 unit of PRBC. B12: 895. Recommend that we do a bone marrow biopsy for further evaluation to rule out MDS. Patient's daughter is going to be out of town next week and therefore we will plan to do it in 2 weeks.  Severe  hypercalcemia: Corrected calcium  is 14: Will obtain CT chest abdomen pelvis to evaluate for any metastatic disease.  I recommended giving IV fluids for 100 mL normal saline along with Zometa 3 mg IV today. If negative we will obtain PTH levels  She will need to be rechecked next week regarding her calcium  levels.   Telephone follow-up of CT scans to discuss results Assessment & Plan Macrocytic anemia, etiology under evaluation Macrocytic anemia with low hemoglobin at 7.1, transfusion needed. Differential includes B12 deficiency and MDS. B12 deficiency suspected despite supplementation. Bone marrow biopsy if B12 deficiency excluded. - Administer 1-2 units blood transfusion. - Add B12 level to today's blood work. - Await iron levels and B12 test results. - Consider bone marrow biopsy if B12 deficiency ruled out.  Unintentional weight loss and decreased appetite Weight loss linked to decreased appetite, possibly due to B12 deficiency and anemia. - Encourage nutritional intake to improve appetite and address potential B12 deficiency.  Stage 3 pressure ulcer, right buttock Stage 3 pressure ulcer on right buttock. Consider left side for bone marrow biopsy to avoid ulcer. - Plan bone marrow biopsy on left side if necessary.      Orders Placed This Encounter  Procedures   CT CHEST ABDOMEN PELVIS WO CONTRAST    Standing Status:   Future    Expected Date:   12/06/2023    Expiration Date:   11/21/2024    Preferred imaging location?:   [facility]    If indicated for the ordered procedure, I authorize the administration of oral contrast media per Radiology protocol:   Yes    Does the patient have a contrast media/X-ray dye allergy?:   No    Release to patient:   Immediate   Vitamin  B12    Standing Status:   Future    Number of Occurrences:   1    Expiration Date:   11/21/2024   The patient has a good understanding of the overall plan. she agrees with it. she will call with any  problems that may develop before the next visit here. Total time spent: 45 mins including face to face time and time spent for planning, charting and co-ordination of care   Viinay K Trevell Pariseau, MD 11/22/23

## 2023-11-22 NOTE — Patient Instructions (Signed)

## 2023-11-22 NOTE — Assessment & Plan Note (Signed)
 04/07/2017:left breast lumpectomy with sentinel lymph node sampling  pT1b pN0, stage IA invasive ductal carcinoma, grade II, with negative margins, total 1 lymph node removed from the axilla, ER 100%, PR 70%, HER2 negative ratio 1.39, Ki-67 2% adjuvant radiation 05/10/2017 - 06/06/2017   Current treatment: Tamoxifen  started 03/17/2017 Plan of treatment: 10 years (patient preference)   Breast cancer surveillance: 1.  Breast MRI 11/11/2019: No evidence of malignancy  2. mammogram 06/02/2023: Benign breast density category C 3.  Breast exam 11/22/2023: Benign, radiation changes   Return to clinic in 1 year for follow-up

## 2023-11-22 NOTE — Progress Notes (Signed)
 Pt Hgb 8.0.  Verbal orders received and placed for pt to receive 1 unit PRBC's.  Orders placed.   Pt Ca 13.8.  Per MD pt needing to receive 500 mL NS as well as 3 mg of IV Zometa today and re-check of CA on Wednesday 11/29/23. MD also ordered CT CAP for further evaluation.  Appt scheduled, pt and family notified and verbalized understanding.

## 2023-11-23 ENCOUNTER — Ambulatory Visit (HOSPITAL_COMMUNITY)
Admission: RE | Admit: 2023-11-23 | Discharge: 2023-11-23 | Disposition: A | Discharge status: Home / Self Care | Source: Ambulatory Visit | Attending: Hematology and Oncology | Admitting: Hematology and Oncology

## 2023-11-23 DIAGNOSIS — Z170 Estrogen receptor positive status [ER+]: Secondary | ICD-10-CM | POA: Diagnosis present

## 2023-11-23 DIAGNOSIS — C50112 Malignant neoplasm of central portion of left female breast: Secondary | ICD-10-CM | POA: Diagnosis present

## 2023-11-23 LAB — BPAM RBC
Blood Product Expiration Date: 202511022359
ISSUE DATE / TIME: 202510011209
Unit Type and Rh: 5100

## 2023-11-23 LAB — TYPE AND SCREEN
ABO/RH(D): O POS
Antibody Screen: NEGATIVE
Unit division: 0

## 2023-11-24 ENCOUNTER — Ambulatory Visit: Admitting: Physical Medicine and Rehabilitation

## 2023-11-27 ENCOUNTER — Other Ambulatory Visit: Payer: Self-pay | Admitting: Hematology and Oncology

## 2023-11-27 ENCOUNTER — Telehealth: Payer: Self-pay | Admitting: Hematology and Oncology

## 2023-11-27 DIAGNOSIS — D649 Anemia, unspecified: Secondary | ICD-10-CM

## 2023-11-27 NOTE — Telephone Encounter (Signed)
 Telephone call: I reviewed the CT chest abdomen pelvis with the patient which did not show any evidence of metastatic disease. Severe anemia and related to B12 deficiency: I recommend a bone marrow biopsy. Hypercalcemia: PTH as well as serum protein electrophoresis will be obtained on Wednesday for her blood work.

## 2023-11-28 ENCOUNTER — Encounter: Payer: Self-pay | Admitting: Family Medicine

## 2023-11-29 ENCOUNTER — Inpatient Hospital Stay

## 2023-11-29 ENCOUNTER — Inpatient Hospital Stay: Admitting: Hematology and Oncology

## 2023-11-29 ENCOUNTER — Other Ambulatory Visit: Payer: Self-pay | Admitting: *Deleted

## 2023-11-29 VITALS — BP 138/59 | HR 106 | Temp 97.4°F | Resp 16 | Ht 62.0 in | Wt 105.9 lb

## 2023-11-29 DIAGNOSIS — D649 Anemia, unspecified: Secondary | ICD-10-CM

## 2023-11-29 DIAGNOSIS — D4621 Refractory anemia with excess of blasts 1: Secondary | ICD-10-CM | POA: Diagnosis not present

## 2023-11-29 DIAGNOSIS — Z170 Estrogen receptor positive status [ER+]: Secondary | ICD-10-CM

## 2023-11-29 DIAGNOSIS — C50112 Malignant neoplasm of central portion of left female breast: Secondary | ICD-10-CM

## 2023-11-29 LAB — CMP (CANCER CENTER ONLY)
ALT: <5 U/L (ref 0–44)
AST: 11 U/L — ABNORMAL LOW (ref 15–41)
Albumin: 3.8 g/dL (ref 3.5–5.0)
Alkaline Phosphatase: 38 U/L (ref 38–126)
Anion gap: 8 (ref 5–15)
BUN: 56 mg/dL — ABNORMAL HIGH (ref 8–23)
CO2: 25 mmol/L (ref 22–32)
Calcium: 9.6 mg/dL (ref 8.9–10.3)
Chloride: 107 mmol/L (ref 98–111)
Creatinine: 1.51 mg/dL — ABNORMAL HIGH (ref 0.44–1.00)
GFR, Estimated: 34 mL/min — ABNORMAL LOW (ref >60)
Glucose, Bld: 89 mg/dL (ref 70–99)
Potassium: 4.2 mmol/L (ref 3.5–5.1)
Sodium: 140 mmol/L (ref 135–145)
Total Bilirubin: 0.4 mg/dL (ref 0.0–1.2)
Total Protein: 7.5 g/dL (ref 6.5–8.1)

## 2023-11-29 LAB — CBC WITH DIFFERENTIAL (CANCER CENTER ONLY)
Abs Immature Granulocytes: 0.02 K/uL (ref 0.00–0.07)
Basophils Absolute: 0.1 K/uL (ref 0.0–0.1)
Basophils Relative: 1 %
Eosinophils Absolute: 0.3 K/uL (ref 0.0–0.5)
Eosinophils Relative: 4 %
HCT: 29.1 % — ABNORMAL LOW (ref 36.0–46.0)
Hemoglobin: 9.4 g/dL — ABNORMAL LOW (ref 12.0–15.0)
Immature Granulocytes: 0 %
Lymphocytes Relative: 28 %
Lymphs Abs: 2 K/uL (ref 0.7–4.0)
MCH: 33.8 pg (ref 26.0–34.0)
MCHC: 32.3 g/dL (ref 30.0–36.0)
MCV: 104.7 fL — ABNORMAL HIGH (ref 80.0–100.0)
Monocytes Absolute: 0.7 K/uL (ref 0.1–1.0)
Monocytes Relative: 10 %
Neutro Abs: 4.1 K/uL (ref 1.7–7.7)
Neutrophils Relative %: 57 %
Platelet Count: 145 K/uL — ABNORMAL LOW (ref 150–400)
RBC: 2.78 MIL/uL — ABNORMAL LOW (ref 3.87–5.11)
RDW: 18.9 % — ABNORMAL HIGH (ref 11.5–15.5)
WBC Count: 7.2 K/uL (ref 4.0–10.5)
nRBC: 0 % (ref 0.0–0.2)

## 2023-11-29 LAB — SAMPLE TO BLOOD BANK

## 2023-11-29 NOTE — Assessment & Plan Note (Signed)
 04/07/2017:left breast lumpectomy with sentinel lymph node sampling  pT1b pN0, stage IA invasive ductal carcinoma, grade II, with negative margins, total 1 lymph node removed from the axilla, ER 100%, PR 70%, HER2 negative ratio 1.39, Ki-67 2% adjuvant radiation 05/10/2017 - 06/06/2017   Current treatment: Tamoxifen  started 03/17/2017 Plan of treatment: 10 years (patient preference)   Breast cancer surveillance: 1.  Breast MRI 11/11/2019: No evidence of malignancy  2. mammogram 06/02/2023: Benign breast density category C 3.  Breast exam 11/22/2023: Benign, radiation changes   Hospitalization 11/02/2023-11/03/2023: Closed C2 fracture Severe anemia: Previously 2 units of PRBC was given in the hospital Today's hemoglobin is 8: I recommended 1 unit of PRBC. B12: 895. Recommend that we do a bone marrow biopsy for further evaluation to rule out MDS.  Severe hypercalcemia: Zometa infusion given 11/22/2023 CT CAP 11/26/2023: No evidence of malignancy Plan: Workup for hypercalcemia including PTH will be obtained

## 2023-11-29 NOTE — Progress Notes (Signed)
 Patient Care Team: Burney Darice CROME, MD as PCP - General (Family Medicine) Vanderbilt Ned, MD as Consulting Physician (General Surgery)  DIAGNOSIS:  Encounter Diagnosis  Name Primary?   Malignant neoplasm of central portion of left breast in female, estrogen receptor positive (HCC) Yes    SUMMARY OF ONCOLOGIC HISTORY: Oncology History  Malignant neoplasm of central portion of left breast in female, estrogen receptor positive (HCC)  03/08/2017 Initial Diagnosis   Central left breast biopsy for a clinical T1b N0, stage IA i IDC, grade 1, e ER/PR positive, HER-2 not amplified, with an Ki-67 of 2%   03/16/2017 Cancer Staging   Staging form: Breast, AJCC 8th Edition - Clinical stage from 03/16/2017: Stage IA (cT1b, cN0, cM0, G1, ER+, PR+, HER2-) - Signed by Odean Potts, MD on 04/19/2022 Method of lymph node assessment: Clinical Histologic grading system: 3 grade system Laterality: Left Tumor size (mm): 8   04/07/2017 Surgery   left breast lumpectomy with sentinel lymph node sampling  pT1b pN0, stage IA invasive ductal carcinoma, grade II, with negative margins, total 1 lymph node removed from the axilla     CHIEF COMPLIANT: Follow-up to review the results of blood work done today.  HISTORY OF PRESENT ILLNESS:  History of Present Illness Heidi Lutz is an 83 year old female with anemia and hypercalcemia who presents for follow-up after a blood transfusion and treatment for high calcium  levels.  She experiences improved strength and mobility following a blood transfusion and treatment for hypercalcemia. Her current hemoglobin level is 9.4 g/dL, and A87 levels are normal. A CT scan shows no evidence of malignancy, only scarring. Blood tests for PTH and myeloma are pending. She is scheduled for a bone marrow biopsy on November 7th and has been reassured about wearing a brace during the procedure.     ALLERGIES:  is allergic to fluorouracil, other, clavulanic acid, codeine,  hydrocodone , and penicillins.  MEDICATIONS:  Current Outpatient Medications  Medication Sig Dispense Refill   albuterol  (VENTOLIN  HFA) 108 (90 Base) MCG/ACT inhaler Inhale 2 puffs into the lungs every 6 (six) hours as needed for wheezing or shortness of breath.     atorvastatin  (LIPITOR) 20 MG tablet Take 20 mg by mouth at bedtime.     Black Cohosh 540 MG CAPS Take 540 mg by mouth daily.      Calcium  Carb-Cholecalciferol (CALCIUM  + D3 PO) Take 1 tablet by mouth 2 (two) times daily.     clobetasol cream (TEMOVATE) 0.05 % Apply 1 Application topically See admin instructions. Apply to affected areas 2 times a day     CRANBERRY PO Take 1 capsule by mouth daily.      cyclobenzaprine  (FLEXERIL ) 5 MG tablet Take 1 tablet (5 mg total) by mouth 3 (three) times daily as needed for muscle spasms.     denosumab (PROLIA) 60 MG/ML SOSY injection Inject 60 mg into the skin every 6 (six) months.     docusate sodium  (COLACE) 100 MG capsule Take 1 capsule (100 mg total) by mouth 2 (two) times daily.     estradiol  (ESTRACE ) 0.1 MG/GM vaginal cream Place 1 Applicatorful vaginally at bedtime.     famotidine  (PEPCID ) 40 MG tablet Take 0.5 tablets (20 mg total) by mouth daily as needed for heartburn or indigestion.     fosfomycin (MONUROL ) 3 g PACK Take 1 packet by mouth every 10 days 9 g 6   Glucosamine-Chondroit-Vit C-Mn (GLUCOSAMINE-CHONDROITIN MAX ST) CAPS Take 1 capsule by mouth daily.  HYDROcodone -acetaminophen  (NORCO/VICODIN) 5-325 MG tablet Take 1-2 tablets by mouth every 6 (six) hours as needed for moderate pain (pain score 4-6) or severe pain (pain score 7-10). 40 tablet 0   KERENDIA  10 MG TABS Take 10 mg by mouth in the morning.     lidocaine  (LIDODERM ) 5 % APPLY 2 PATCHES TO SKIN EVERY DAY, REMOVE AND REPLACE PATCH AFTER 12 HRS 180 patch 0   loratadine  (CLARITIN ) 10 MG tablet Take 10 mg by mouth daily.     Magnesium  200 MG TABS Take 200 mg by mouth daily.      multivitamin (RENA-VIT) TABS tablet Take  1 tablet by mouth daily.     mupirocin  ointment (BACTROBAN ) 2 % Place 1 Application into the nose 2 (two) times daily.     NIFEdipine  (PROCARDIA  XL/ADALAT -CC) 90 MG 24 hr tablet Take 90 mg by mouth daily.      ondansetron  (ZOFRAN ) 4 MG tablet Take 1 tablet (4 mg total) by mouth every 6 (six) hours as needed for nausea or vomiting.     pregabalin  (LYRICA ) 50 MG capsule Take 100 mg by mouth 2 (two) times daily.     Probiotic Product (PROBIOTIC ADVANCED PO) Take 1 capsule by mouth daily.      solifenacin  (VESICARE ) 10 MG tablet Take 1 tablet (10 mg total) by mouth every evening. 90 tablet 3   tamoxifen  (NOLVADEX ) 20 MG tablet TAKE 1 TABLET EVERY DAY 90 tablet 3   TYLENOL  8 HOUR ARTHRITIS PAIN 650 MG CR tablet Take 1,300 mg by mouth daily as needed for pain.     BREZTRI  AEROSPHERE 160-9-4.8 MCG/ACT AERO inhaler Inhale 2 puffs into the lungs 2 (two) times daily. (Patient not taking: Reported on 11/29/2023)     No current facility-administered medications for this visit.    PHYSICAL EXAMINATION: ECOG PERFORMANCE STATUS: 1 - Symptomatic but completely ambulatory  Vitals:   11/29/23 1521  BP: (!) 138/59  Pulse: (!) 106  Resp: 16  Temp: (!) 97.4 F (36.3 C)  SpO2: 100%   Filed Weights   11/29/23 1521  Weight: 105 lb 14.4 oz (48 kg)    Physical Exam    (exam performed in the presence of a chaperone)  LABORATORY DATA:  I have reviewed the data as listed    Latest Ref Rng & Units 11/29/2023    2:30 PM 11/22/2023    9:36 AM 11/02/2023    6:47 AM  CMP  Glucose 70 - 99 mg/dL 89  873  893   BUN 8 - 23 mg/dL 56  59  29   Creatinine 0.44 - 1.00 mg/dL 8.48  8.46  8.79   Sodium 135 - 145 mmol/L 140  137  137   Potassium 3.5 - 5.1 mmol/L 4.2  4.3  4.0   Chloride 98 - 111 mmol/L 107  100  103   CO2 22 - 32 mmol/L 25  30    Calcium  8.9 - 10.3 mg/dL 9.6  86.1    Total Protein 6.5 - 8.1 g/dL 7.5  7.2    Total Bilirubin 0.0 - 1.2 mg/dL 0.4  0.4    Alkaline Phos 38 - 126 U/L 38  43    AST 15  - 41 U/L 11  10    ALT 0 - 44 U/L <5  5      Lab Results  Component Value Date   WBC 7.2 11/29/2023   HGB 9.4 (L) 11/29/2023   HCT 29.1 (L) 11/29/2023  MCV 104.7 (H) 11/29/2023   PLT 145 (L) 11/29/2023   NEUTROABS 4.1 11/29/2023    ASSESSMENT & PLAN:  Malignant neoplasm of central portion of left breast in female, estrogen receptor positive (HCC) 04/07/2017:left breast lumpectomy with sentinel lymph node sampling  pT1b pN0, stage IA invasive ductal carcinoma, grade II, with negative margins, total 1 lymph node removed from the axilla, ER 100%, PR 70%, HER2 negative ratio 1.39, Ki-67 2% adjuvant radiation 05/10/2017 - 06/06/2017   Current treatment: Tamoxifen  started 03/17/2017 Plan of treatment: 10 years (patient preference)   Breast cancer surveillance: 1.  Breast MRI 11/11/2019: No evidence of malignancy  2. mammogram 06/02/2023: Benign breast density category C 3.  Breast exam 11/22/2023: Benign, radiation changes   Hospitalization 11/02/2023-11/03/2023: Closed C2 fracture Severe anemia: Previously 2 units of PRBC was given in the hospital Today's hemoglobin is 9.4: No need of blood transfusion. B12: 895. Recommend that we do a bone marrow biopsy for further evaluation to rule out myeloma (given recent hypercalcemia and acute renal failure).  Severe hypercalcemia: Zometa infusion given 11/22/2023 CT CAP 11/26/2023: No evidence of malignancy  Plan: Workup for hypercalcemia including PTH has been obtained today If PTH is elevated then we will need a thyroid  and parathyroid evaluation and consideration for parathyroidectomy if there is an parathyroid adenoma.  We will follow-up after bone marrow biopsy to discuss results.  Assessment & Plan Hypercalcemia Severe hypercalcemia previously treated. Cause under investigation with primary hyperparathyroidism and multiple myeloma as potential causes. - Await current calcium  level results. - Consider rechecking blood based on today's  calcium  level results.  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 PTH test conducted, results pending. If positive, ultrasound of the neck will be ordered for abnormal parathyroid gland identification, possibly requiring surgery. - Await PTH test results. - If PTH is positive, order ultrasound of the neck. - Consider surgical referral if abnormal gland is identified.  Suspected multiple myeloma Suspected due to anemia and hypercalcemia. Blood test for myeloma conducted, results expected in a week. If confirmed, bone marrow biopsy may be expedited. - Await myeloma test results. - Consider moving up bone marrow biopsy if myeloma is confirmed.  Anemia with macrocytosis Anemia with macrocytosis previously treated with transfusion. Current hemoglobin 9.4, improved. No transfusion needed today. - Monitor hemoglobin levels. - Recheck blood on October 14th during primary care visit.      No orders of the defined types were placed in this encounter.  The patient has a good understanding of the overall plan. she agrees with it. she will call with any problems that may develop before the next visit here.  I personally spent a total of 45 no minutes in the care of the patient today including preparing to see the patient, getting/reviewing separately obtained history, performing a medically appropriate exam/evaluation, counseling and educating, placing orders, referring and communicating with other health care professionals, documenting clinical information in the EHR, independently interpreting results, communicating results, and coordinating care.   Viinay K Cris Gibby, MD 11/29/23

## 2023-11-29 NOTE — Progress Notes (Signed)
 Orders for Lidocaine  placed under sign and held for upcoming bone marrow biopsy.

## 2023-11-30 ENCOUNTER — Other Ambulatory Visit: Payer: Self-pay | Admitting: *Deleted

## 2023-11-30 DIAGNOSIS — Z0000 Encounter for general adult medical examination without abnormal findings: Secondary | ICD-10-CM

## 2023-11-30 DIAGNOSIS — D649 Anemia, unspecified: Secondary | ICD-10-CM

## 2023-11-30 LAB — PTH, INTACT AND CALCIUM
Calcium, Total (PTH): 9.2 mg/dL (ref 8.7–10.3)
PTH: 12 pg/mL — ABNORMAL LOW (ref 15–65)

## 2023-11-30 LAB — KAPPA/LAMBDA LIGHT CHAINS
Kappa free light chain: 60.5 mg/L — ABNORMAL HIGH (ref 3.3–19.4)
Kappa, lambda light chain ratio: 1.34 (ref 0.26–1.65)
Lambda free light chains: 45.3 mg/L — ABNORMAL HIGH (ref 5.7–26.3)

## 2023-11-30 NOTE — Progress Notes (Signed)
 RN successfully faxed [phone]) recent CBC and CMP orders to pt PCP.  PCP requesting our office draw A1C during next visit with our office. Orders placed with CC to pt PCP Dr. Burney.

## 2023-12-04 ENCOUNTER — Inpatient Hospital Stay

## 2023-12-04 ENCOUNTER — Inpatient Hospital Stay: Admitting: Hematology and Oncology

## 2023-12-04 DIAGNOSIS — D649 Anemia, unspecified: Secondary | ICD-10-CM

## 2023-12-04 DIAGNOSIS — D4621 Refractory anemia with excess of blasts 1: Secondary | ICD-10-CM | POA: Diagnosis not present

## 2023-12-04 DIAGNOSIS — Z0000 Encounter for general adult medical examination without abnormal findings: Secondary | ICD-10-CM

## 2023-12-04 LAB — CBC WITH DIFFERENTIAL (CANCER CENTER ONLY)
Abs Immature Granulocytes: 0.02 K/uL (ref 0.00–0.07)
Basophils Absolute: 0.1 K/uL (ref 0.0–0.1)
Basophils Relative: 1 %
Eosinophils Absolute: 0.3 K/uL (ref 0.0–0.5)
Eosinophils Relative: 7 %
HCT: 25.9 % — ABNORMAL LOW (ref 36.0–46.0)
Hemoglobin: 8.4 g/dL — ABNORMAL LOW (ref 12.0–15.0)
Immature Granulocytes: 0 %
Lymphocytes Relative: 32 %
Lymphs Abs: 1.4 K/uL (ref 0.7–4.0)
MCH: 33.7 pg (ref 26.0–34.0)
MCHC: 32.4 g/dL (ref 30.0–36.0)
MCV: 104 fL — ABNORMAL HIGH (ref 80.0–100.0)
Monocytes Absolute: 0.5 K/uL (ref 0.1–1.0)
Monocytes Relative: 11 %
Neutro Abs: 2.1 K/uL (ref 1.7–7.7)
Neutrophils Relative %: 49 %
Platelet Count: 141 K/uL — ABNORMAL LOW (ref 150–400)
RBC: 2.49 MIL/uL — ABNORMAL LOW (ref 3.87–5.11)
RDW: 19.1 % — ABNORMAL HIGH (ref 11.5–15.5)
WBC Count: 4.5 K/uL (ref 4.0–10.5)
nRBC: 0 % (ref 0.0–0.2)

## 2023-12-04 LAB — HEMOGLOBIN A1C
Hgb A1c MFr Bld: 5.2 % (ref 4.8–5.6)
Mean Plasma Glucose: 102.54 mg/dL

## 2023-12-04 MED ORDER — LIDOCAINE HCL 2 % IJ SOLN
20.0000 mL | Freq: Once | INTRAMUSCULAR | Status: AC
  Administered 2023-12-04: 400 mg via INTRADERMAL

## 2023-12-04 NOTE — Patient Instructions (Signed)
 Bone Marrow Aspiration and Bone Marrow Biopsy, Adult, Care After The following information offers guidance on how to care for yourself after your procedure. Your health care provider may also give you more specific instructions. If you have problems or questions, contact your health care provider. What can I expect after the procedure? After the procedure, it is common to have: Mild pain and tenderness. Swelling. Bruising. Follow these instructions at home: Incision care  Follow instructions from your health care provider about how to take care of the incision site. Make sure you: Wash your hands with soap and water for at least 20 seconds before and after you change your bandage (dressing). If soap and water are not available, use hand sanitizer. Change your dressing as told by your health care provider. Leave stitches (sutures), skin glue, or adhesive strips in place. These skin closures may need to stay in place for 2 weeks or longer. If adhesive strip edges start to loosen and curl up, you may trim the loose edges. Do not remove adhesive strips completely unless your health care provider tells you to do that. Check your incision site every day for signs of infection. Check for: More redness, swelling, or pain. Fluid or blood. Warmth. Pus or a bad smell. Activity Return to your normal activities as told by your health care provider. Ask your health care provider what activities are safe for you. Do not lift anything that is heavier than 10 lb (4.5 kg), or the limit that you are told, until your health care provider says that it is safe. If you were given a sedative during the procedure, it can affect you for several hours. Do not drive or operate machinery until your health care provider says that it is safe. General instructions  Take over-the-counter and prescription medicines only as told by your health care provider. Do not take baths, swim, or use a hot tub until your health care  provider approves. Ask your health care provider if you may take showers. You may only be allowed to take sponge baths. If directed, put ice on the affected area. To do this: Put ice in a plastic bag. Place a towel between your skin and the bag. Leave the ice on for 20 minutes, 2-3 times a day. If your skin turns bright red, remove the ice right away to prevent skin damage. The risk of skin damage is higher if you cannot feel pain, heat, or cold. Contact a health care provider if: You have signs of infection. Your pain is not controlled with medicine. You have cancer, and a temperature of 100.89F (38C) or higher. Get help right away if: You have a temperature of 101F (38.3C) or higher, or as told by your health care provider. You have bleeding from the incision site that cannot be controlled. This information is not intended to replace advice given to you by your health care provider. Make sure you discuss any questions you have with your health care provider. Document Revised: 06/14/2021 Document Reviewed: 06/14/2021 Elsevier Patient Education  2024 [employer].

## 2023-12-04 NOTE — Progress Notes (Signed)
 [facility] BONE MARROW BIOPSY/ASPIRATE PROGRESS NOTE  Heidi Lutz presents for Bone Marrow biopsy per MD orders. Heidi Lutz verbalized understanding of procedure. Consent reviewed and signed.  Heidi Lutz positioned supine for procedure. Time-out performed and Bone Marrow Checklist. Procedure began at 0755. Xylocaine  2% 10 cc used for local and administered to patient by Dr Odean. Procedure completed at 0820. Patient tolerated well. Pressure dressing applied to the right hip with instructions to leave in place for 24 hours. Patient instructed to report any bleeding that saturates dressing and to take pain medication as directed. Dressing dry and intact to the right hip on discharge.

## 2023-12-04 NOTE — Progress Notes (Signed)
 INDICATION: Severe anemia evaluation, hypercalcemia and renal failure    Bone Marrow Biopsy and Aspiration Procedure Note   Informed consent was obtained and potential risks including bleeding, infection and pain were reviewed with the patient.  The patient's name, date of birth, identification, consent and allergies were verified prior to the start of procedure and time out was performed.  The right posterior iliac crest was chosen as the site of biopsy.  The skin was prepped with ChloraPrep.   12 cc of 1% lidocaine  was used to provide local anaesthesia.   10 cc of bone marrow aspirate was obtained  Bone biopsy could not be obtained after several tries.  Pressure was applied to the biopsy site and bandage was placed over the biopsy site. Patient was made to lie on the back for 15 mins prior to discharge.  The procedure was tolerated well. COMPLICATIONS: None BLOOD LOSS: none The patient was discharged home in stable condition with a 1 week follow up to review results.  Patient was provided with post bone marrow biopsy instructions and instructed to call if there was any bleeding or worsening pain.  Specimens sent for flow cytometry, cytogenetics and additional studies.  Signed Viinay K Willowdean Luhmann, MD

## 2023-12-05 LAB — MULTIPLE MYELOMA PANEL, SERUM
Albumin SerPl Elph-Mcnc: 3.2 g/dL (ref 2.9–4.4)
Albumin/Glob SerPl: 1 (ref 0.7–1.7)
Alpha 1: 0.4 g/dL (ref 0.0–0.4)
Alpha2 Glob SerPl Elph-Mcnc: 0.8 g/dL (ref 0.4–1.0)
B-Globulin SerPl Elph-Mcnc: 0.9 g/dL (ref 0.7–1.3)
Gamma Glob SerPl Elph-Mcnc: 1.3 g/dL (ref 0.4–1.8)
Globulin, Total: 3.5 g/dL (ref 2.2–3.9)
IgA: 294 mg/dL (ref 64–422)
IgG (Immunoglobin G), Serum: 1368 mg/dL (ref 586–1602)
IgM (Immunoglobulin M), Srm: 81 mg/dL (ref 26–217)
Total Protein ELP: 6.7 g/dL (ref 6.0–8.5)

## 2023-12-07 LAB — SURGICAL PATHOLOGY

## 2023-12-11 ENCOUNTER — Inpatient Hospital Stay: Admitting: Hematology and Oncology

## 2023-12-11 VITALS — BP 111/59 | HR 111 | Temp 97.8°F | Resp 19 | Ht 62.0 in | Wt 108.9 lb

## 2023-12-11 DIAGNOSIS — D4621 Refractory anemia with excess of blasts 1: Secondary | ICD-10-CM | POA: Diagnosis not present

## 2023-12-11 DIAGNOSIS — D649 Anemia, unspecified: Secondary | ICD-10-CM

## 2023-12-11 MED ORDER — MEGESTROL ACETATE 625 MG/5ML PO SUSP: 625.0000 mg | Freq: Every day | ORAL | 0 refills | Status: AC

## 2023-12-11 NOTE — Progress Notes (Signed)
 Patient Care Team: Burney Darice CROME, MD as PCP - General (Family Medicine) Vanderbilt Ned, MD as Consulting Physician (General Surgery)  DIAGNOSIS:  Encounter Diagnosis  Name Primary?   Malignant neoplasm of central portion of left breast in female, estrogen receptor positive (HCC) Yes    SUMMARY OF ONCOLOGIC HISTORY: Oncology History  Malignant neoplasm of central portion of left breast in female, estrogen receptor positive (HCC)  03/08/2017 Initial Diagnosis   Central left breast biopsy for a clinical T1b N0, stage IA i IDC, grade 1, e ER/PR positive, HER-2 not amplified, with an Ki-67 of 2%   03/16/2017 Cancer Staging   Staging form: Breast, AJCC 8th Edition - Clinical stage from 03/16/2017: Stage IA (cT1b, cN0, cM0, G1, ER+, PR+, HER2-) - Signed by Odean Potts, MD on 04/19/2022 Method of lymph node assessment: Clinical Histologic grading system: 3 grade system Laterality: Left Tumor size (mm): 8   04/07/2017 Surgery   left breast lumpectomy with sentinel lymph node sampling  pT1b pN0, stage IA invasive ductal carcinoma, grade II, with negative margins, total 1 lymph node removed from the axilla     CHIEF COMPLIANT:   HISTORY OF PRESENT ILLNESS: Discussed the use of AI scribe software for clinical note transcription with the patient, who gave verbal consent to proceed.  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 She is accompanied by her family members.  She has myelodysplastic syndrome with excess blasts-1 (MDS-EB1), affecting her blood counts, particularly red blood cells, leading to anemia. Her blast count is 7%. She experiences significant fatigue due to anemia, with hemoglobin levels recently at 8.4 g/dL. She has required blood transfusions three times since August 2025, with the most recent on November 22, 2023. Post-transfusion, her  hemoglobin improved to 10 g/dL but has since decreased. Her white blood cell count remains stable, and her platelet count is within a reasonable range. Red blood cell production is notably affected, necessitating regular monitoring and potential transfusions approximately once a month.  She has a history of elevated calcium  levels, with negative tests for parathyroid function and myeloma. A whole-body scan showed no cancer concerns, and her calcium  supplements have been discontinued.  She has a poor appetite and has experienced weight loss, currently weighing 102.2 pounds. Her appetite is improving slightly but remains below normal. An appetite stimulant previously caused adverse effects, including hallucinations. No recent infections or other acute symptoms.     ALLERGIES:  is allergic to fluorouracil, other, clavulanic acid, codeine, hydrocodone , and penicillins.  MEDICATIONS:  Current Outpatient Medications  Medication Sig Dispense Refill   albuterol  (VENTOLIN  HFA) 108 (90 Base) MCG/ACT inhaler Inhale 2 puffs into the lungs every 6 (six) hours as needed for wheezing or shortness of breath.     atorvastatin  (LIPITOR) 20 MG tablet Take 20 mg by mouth at bedtime.     Black Cohosh 540 MG CAPS Take 540 mg by mouth daily.      BREZTRI  AEROSPHERE 160-9-4.8 MCG/ACT AERO inhaler Inhale 2 puffs into the lungs 2 (two) times daily. (Patient not taking: Reported on 11/29/2023)     Calcium  Carb-Cholecalciferol (CALCIUM  + D3 PO) Take 1 tablet by mouth 2 (two) times daily.     clobetasol cream (TEMOVATE) 0.05 % Apply 1 Application topically See admin instructions. Apply to affected areas 2 times a day     CRANBERRY PO Take 1 capsule by mouth daily.  cyclobenzaprine  (FLEXERIL ) 5 MG tablet Take 1 tablet (5 mg total) by mouth 3 (three) times daily as needed for muscle spasms.     denosumab (PROLIA) 60 MG/ML SOSY injection Inject 60 mg into the skin every 6 (six) months.     docusate sodium  (COLACE) 100 MG  capsule Take 1 capsule (100 mg total) by mouth 2 (two) times daily.     estradiol  (ESTRACE ) 0.1 MG/GM vaginal cream Place 1 Applicatorful vaginally at bedtime.     famotidine  (PEPCID ) 40 MG tablet Take 0.5 tablets (20 mg total) by mouth daily as needed for heartburn or indigestion.     fosfomycin (MONUROL ) 3 g PACK Take 1 packet by mouth every 10 days 9 g 6   Glucosamine-Chondroit-Vit C-Mn (GLUCOSAMINE-CHONDROITIN MAX ST) CAPS Take 1 capsule by mouth daily.      HYDROcodone -acetaminophen  (NORCO/VICODIN) 5-325 MG tablet Take 1-2 tablets by mouth every 6 (six) hours as needed for moderate pain (pain score 4-6) or severe pain (pain score 7-10). 40 tablet 0   KERENDIA  10 MG TABS Take 10 mg by mouth in the morning.     lidocaine  (LIDODERM ) 5 % APPLY 2 PATCHES TO SKIN EVERY DAY, REMOVE AND REPLACE PATCH AFTER 12 HRS 180 patch 0   loratadine  (CLARITIN ) 10 MG tablet Take 10 mg by mouth daily.     Magnesium  200 MG TABS Take 200 mg by mouth daily.      multivitamin (RENA-VIT) TABS tablet Take 1 tablet by mouth daily.     mupirocin  ointment (BACTROBAN ) 2 % Place 1 Application into the nose 2 (two) times daily.     NIFEdipine  (PROCARDIA  XL/ADALAT -CC) 90 MG 24 hr tablet Take 90 mg by mouth daily.      ondansetron  (ZOFRAN ) 4 MG tablet Take 1 tablet (4 mg total) by mouth every 6 (six) hours as needed for nausea or vomiting.     pregabalin  (LYRICA ) 50 MG capsule Take 100 mg by mouth 2 (two) times daily.     Probiotic Product (PROBIOTIC ADVANCED PO) Take 1 capsule by mouth daily.      solifenacin  (VESICARE ) 10 MG tablet Take 1 tablet (10 mg total) by mouth every evening. 90 tablet 3   tamoxifen  (NOLVADEX ) 20 MG tablet TAKE 1 TABLET EVERY DAY 90 tablet 3   TYLENOL  8 HOUR ARTHRITIS PAIN 650 MG CR tablet Take 1,300 mg by mouth daily as needed for pain.     No current facility-administered medications for this visit.    PHYSICAL EXAMINATION: ECOG PERFORMANCE STATUS: 1 - Symptomatic but completely  ambulatory  There were no vitals filed for this visit. There were no vitals filed for this visit.  Physical Exam MEASUREMENTS: Weight- 102.2.  (exam performed in the presence of a chaperone)  LABORATORY DATA:  I have reviewed the data as listed    Latest Ref Rng & Units 11/29/2023    2:30 PM 11/22/2023    9:36 AM 11/02/2023    6:47 AM  CMP  Glucose 70 - 99 mg/dL 89  873  893   BUN 8 - 23 mg/dL 56  59  29   Creatinine 0.44 - 1.00 mg/dL 8.48  8.46  8.79   Sodium 135 - 145 mmol/L 140  137  137   Potassium 3.5 - 5.1 mmol/L 4.2  4.3  4.0   Chloride 98 - 111 mmol/L 107  100  103   CO2 22 - 32 mmol/L 25  30    Calcium  8.9 - 10.3 mg/dL 8.7 -  10.3 mg/dL 9.6    9.2  86.1    Total Protein 6.5 - 8.1 g/dL 7.5  7.2    Total Bilirubin 0.0 - 1.2 mg/dL 0.4  0.4    Alkaline Phos 38 - 126 U/L 38  43    AST 15 - 41 U/L 11  10    ALT 0 - 44 U/L <5  5      Lab Results  Component Value Date   WBC 4.5 12/04/2023   HGB 8.4 (L) 12/04/2023   HCT 25.9 (L) 12/04/2023   MCV 104.0 (H) 12/04/2023   PLT 141 (L) 12/04/2023   NEUTROABS 2.1 12/04/2023    ASSESSMENT & PLAN:  Malignant neoplasm of central portion of left breast in female, estrogen receptor positive (HCC) 04/07/2017:left breast lumpectomy with sentinel lymph node sampling  pT1b pN0, stage IA invasive ductal carcinoma, grade II, with negative margins, total 1 lymph node removed from the axilla, ER 100%, PR 70%, HER2 negative ratio 1.39, Ki-67 2% adjuvant radiation 05/10/2017 - 06/06/2017   Current treatment: Tamoxifen  started 03/17/2017 Plan of treatment: 10 years (patient preference)   Breast cancer surveillance: 1.  Breast MRI 11/11/2019: No evidence of malignancy  2. mammogram 06/02/2023: Benign breast density category C 3.  Breast exam 12/11/2023: Benign, radiation changes 4.  CT CAP 11/26/2023: No evidence of metastatic disease   Hospitalization 11/02/2023-11/03/2023: Closed C2 fracture Severe anemia: Previously 2 units of PRBC was given  in the hospital B12: 895. Bone marrow biopsy: 12/04/2023: Flow cytometry: 7% abnormal blast population (positive for CD34, CD38, CD33, CD56, CD117 and HLA-DR), mildly hypercellular bone marrow 30% with trilineage hematopoiesis and no increase in plasma cells.   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 Treatment plan: Will start with Retacrit injections every 3 weeks. If cytopenias become more prominent, we may consider repeating a bone marrow biopsy and consideration for Vidaza plus or minus venetoclax.  Patient is agreeable with the plan.  She will come back next week to start Retacrit injections.  We will also do type and cross for blood transfusion if needed and also CMP for hypercalcemia evaluation.  I will see the patient back in 4 weeks. 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 Aim to prevent leukemic transformation and achieve transfusion independence. - Administer Aranesp or Retacrit every three weeks. - Monitor hemoglobin; inject if <10.5 g/dL. - Assess blood counts for leukocyte/thrombocyte changes. - Consider Vidaza if treatment fails or leukemia progresses.  Hypercalcemia, etiology unclear Hypercalcemia with normal parathyroid and negative myeloma tests. No malignancy on scan. Calcium  supplements stopped. - Administer Zometa infusion. - Monitor calcium  levels for additional Zometa need.  Poor appetite Poor appetite possibly linked to anemia. Previous stimulant caused hallucinations. - Prescribe liquid marijuana derivative.      No orders of the defined types  were placed in this encounter.  The patient has a good understanding of the overall plan. she agrees with it. she will call with any problems that may develop before the next visit here.  I personally spent a total of 30 minutes in the care of the patient today including preparing to see the patient, getting/reviewing separately obtained history, performing a  medically appropriate exam/evaluation, counseling and educating, placing orders, referring and communicating with other health care professionals, documenting clinical information in the EHR, independently interpreting results, communicating results, and coordinating care.   Viinay K Lolah Coghlan, MD 12/11/23

## 2023-12-11 NOTE — Assessment & Plan Note (Signed)
 04/07/2017:left breast lumpectomy with sentinel lymph node sampling  pT1b pN0, stage IA invasive ductal carcinoma, grade II, with negative margins, total 1 lymph node removed from the axilla, ER 100%, PR 70%, HER2 negative ratio 1.39, Ki-67 2% adjuvant radiation 05/10/2017 - 06/06/2017   Current treatment: Tamoxifen  started 03/17/2017 Plan of treatment: 10 years (patient preference)   Breast cancer surveillance: 1.  Breast MRI 11/11/2019: No evidence of malignancy  2. mammogram 06/02/2023: Benign breast density category C 3.  Breast exam 12/11/2023: Benign, radiation changes 4.  CT CAP 11/26/2023: No evidence of metastatic disease   Hospitalization 11/02/2023-11/03/2023: Closed C2 fracture Severe anemia: Previously 2 units of PRBC was given in the hospital Today's hemoglobin is 9.4: No need of blood transfusion. B12: 895. Bone marrow biopsy: 12/04/2023: Flow cytometry: 7% abnormal blast population (positive for CD34, CD38, CD33, CD56, CD117 and HLA-DR), mildly hypercellular bone marrow 30% with trilineage hematopoiesis and no increase in plasma cells.   Severe hypercalcemia: Zometa infusion given 11/22/2023 CT CAP 11/26/2023: No evidence of malignancy   Plan: PTH 12 (range is 15-65): Therefore PTH is appropriately low    We will follow-up after bone marrow biopsy to discuss results.

## 2023-12-13 ENCOUNTER — Encounter (HOSPITAL_COMMUNITY): Payer: Self-pay | Admitting: Hematology and Oncology

## 2023-12-18 ENCOUNTER — Inpatient Hospital Stay

## 2023-12-18 DIAGNOSIS — D649 Anemia, unspecified: Secondary | ICD-10-CM

## 2023-12-18 DIAGNOSIS — D4621 Refractory anemia with excess of blasts 1: Secondary | ICD-10-CM | POA: Diagnosis not present

## 2023-12-18 LAB — CBC WITH DIFFERENTIAL (CANCER CENTER ONLY)
Abs Immature Granulocytes: 0.02 K/uL (ref 0.00–0.07)
Basophils Absolute: 0 K/uL (ref 0.0–0.1)
Basophils Relative: 1 %
Eosinophils Absolute: 0.3 K/uL (ref 0.0–0.5)
Eosinophils Relative: 6 %
HCT: 25.6 % — ABNORMAL LOW (ref 36.0–46.0)
Hemoglobin: 8.4 g/dL — ABNORMAL LOW (ref 12.0–15.0)
Immature Granulocytes: 1 %
Lymphocytes Relative: 42 %
Lymphs Abs: 1.9 K/uL (ref 0.7–4.0)
MCH: 35.1 pg — ABNORMAL HIGH (ref 26.0–34.0)
MCHC: 32.8 g/dL (ref 30.0–36.0)
MCV: 107.1 fL — ABNORMAL HIGH (ref 80.0–100.0)
Monocytes Absolute: 0.7 K/uL (ref 0.1–1.0)
Monocytes Relative: 15 %
Neutro Abs: 1.6 K/uL — ABNORMAL LOW (ref 1.7–7.7)
Neutrophils Relative %: 35 %
Platelet Count: 125 K/uL — ABNORMAL LOW (ref 150–400)
RBC: 2.39 MIL/uL — ABNORMAL LOW (ref 3.87–5.11)
RDW: 20.6 % — ABNORMAL HIGH (ref 11.5–15.5)
WBC Count: 4.4 K/uL (ref 4.0–10.5)
nRBC: 0 % (ref 0.0–0.2)

## 2023-12-18 LAB — CMP (CANCER CENTER ONLY)
ALT: <5 U/L (ref 0–44)
AST: 11 U/L — ABNORMAL LOW (ref 15–41)
Albumin: 3.7 g/dL (ref 3.5–5.0)
Alkaline Phosphatase: 32 U/L — ABNORMAL LOW (ref 38–126)
Anion gap: 7 (ref 5–15)
BUN: 44 mg/dL — ABNORMAL HIGH (ref 8–23)
CO2: 24 mmol/L (ref 22–32)
Calcium: 8.6 mg/dL — ABNORMAL LOW (ref 8.9–10.3)
Chloride: 109 mmol/L (ref 98–111)
Creatinine: 1.52 mg/dL — ABNORMAL HIGH (ref 0.44–1.00)
GFR, Estimated: 34 mL/min — ABNORMAL LOW (ref >60)
Glucose, Bld: 110 mg/dL — ABNORMAL HIGH (ref 70–99)
Potassium: 4.3 mmol/L (ref 3.5–5.1)
Sodium: 140 mmol/L (ref 135–145)
Total Bilirubin: 0.6 mg/dL (ref 0.0–1.2)
Total Protein: 6.9 g/dL (ref 6.5–8.1)

## 2023-12-18 LAB — SAMPLE TO BLOOD BANK

## 2023-12-18 MED ORDER — EPOETIN ALFA-EPBX 40000 UNIT/ML IJ SOLN
40000.0000 [IU] | Freq: Once | INTRAMUSCULAR | Status: AC
  Administered 2023-12-18: 40000 [IU] via SUBCUTANEOUS
  Filled 2023-12-18: qty 1

## 2023-12-20 ENCOUNTER — Encounter (HOSPITAL_COMMUNITY): Payer: Self-pay | Admitting: Hematology and Oncology

## 2023-12-24 ENCOUNTER — Encounter (HOSPITAL_BASED_OUTPATIENT_CLINIC_OR_DEPARTMENT_OTHER): Payer: Self-pay | Admitting: Emergency Medicine

## 2023-12-24 ENCOUNTER — Emergency Department (HOSPITAL_BASED_OUTPATIENT_CLINIC_OR_DEPARTMENT_OTHER)
Admission: EM | Admit: 2023-12-24 | Discharge: 2023-12-24 | Disposition: A | Discharge status: Home / Self Care | Attending: Emergency Medicine | Admitting: Emergency Medicine

## 2023-12-24 ENCOUNTER — Emergency Department (HOSPITAL_BASED_OUTPATIENT_CLINIC_OR_DEPARTMENT_OTHER)

## 2023-12-24 ENCOUNTER — Other Ambulatory Visit: Payer: Self-pay

## 2023-12-24 DIAGNOSIS — M7989 Other specified soft tissue disorders: Secondary | ICD-10-CM | POA: Diagnosis present

## 2023-12-24 DIAGNOSIS — M79662 Pain in left lower leg: Secondary | ICD-10-CM | POA: Insufficient documentation

## 2023-12-24 LAB — BASIC METABOLIC PANEL WITH GFR
Anion gap: 12 (ref 5–15)
BUN: 28 mg/dL — ABNORMAL HIGH (ref 8–23)
CO2: 22 mmol/L (ref 22–32)
Calcium: 9 mg/dL (ref 8.9–10.3)
Chloride: 105 mmol/L (ref 98–111)
Creatinine, Ser: 1.56 mg/dL — ABNORMAL HIGH (ref 0.44–1.00)
GFR, Estimated: 32 mL/min — ABNORMAL LOW (ref >60)
Glucose, Bld: 106 mg/dL — ABNORMAL HIGH (ref 70–99)
Potassium: 4.5 mmol/L (ref 3.5–5.1)
Sodium: 139 mmol/L (ref 135–145)

## 2023-12-24 LAB — CBC WITH DIFFERENTIAL/PLATELET
Abs Immature Granulocytes: 0.02 K/uL (ref 0.00–0.07)
Basophils Absolute: 0.1 K/uL (ref 0.0–0.1)
Basophils Relative: 1 %
Eosinophils Absolute: 0.3 K/uL (ref 0.0–0.5)
Eosinophils Relative: 5 %
HCT: 25.6 % — ABNORMAL LOW (ref 36.0–46.0)
Hemoglobin: 8.3 g/dL — ABNORMAL LOW (ref 12.0–15.0)
Immature Granulocytes: 0 %
Lymphocytes Relative: 35 %
Lymphs Abs: 1.9 K/uL (ref 0.7–4.0)
MCH: 35.6 pg — ABNORMAL HIGH (ref 26.0–34.0)
MCHC: 32.4 g/dL (ref 30.0–36.0)
MCV: 109.9 fL — ABNORMAL HIGH (ref 80.0–100.0)
Monocytes Absolute: 1.1 K/uL — ABNORMAL HIGH (ref 0.1–1.0)
Monocytes Relative: 21 %
Neutro Abs: 2 K/uL (ref 1.7–7.7)
Neutrophils Relative %: 38 %
Platelets: 144 K/uL — ABNORMAL LOW (ref 150–400)
RBC: 2.33 MIL/uL — ABNORMAL LOW (ref 3.87–5.11)
RDW: 21.5 % — ABNORMAL HIGH (ref 11.5–15.5)
WBC: 5.4 K/uL (ref 4.0–10.5)
nRBC: 0 % (ref 0.0–0.2)

## 2023-12-24 NOTE — ED Notes (Signed)
 Pt alert and oriented X 4 at the time of discharge. RR even and unlabored. No acute distress noted. Pt verbalized understanding of discharge instructions as discussed. Pt in wheelchair to lobby at time of discharge.

## 2023-12-24 NOTE — ED Triage Notes (Signed)
 Pt reports recent injection for low blood cells   This morning her left foot and arch had swelling and pain that is spreading up to below the knee.   Was told one of the side effects of the injection was blood clots.

## 2023-12-24 NOTE — ED Provider Notes (Signed)
 [facility] Provider Note   CSN: [id number] Arrival date & time: 12/24/23  1701     Patient presents with: Leg Swelling   Heidi Lutz is a 83 y.o. female.   Patient here for DVT study.  Some left lower leg swelling that they noticed here recently by having pain to the left lower calf area foot area.  Patient has MDS started on new medicine for this that supposedly could cause blood clots.  She denies any shortness of breath chest pain weakness numbness tingling.  Denies any fevers or chills.  History of high cholesterol.  History of rheumatoid arthritis IBS  The history is provided by the patient.       Prior to Admission medications   Medication Sig Start Date End Date Taking? Authorizing Provider  albuterol  (VENTOLIN  HFA) 108 (90 Base) MCG/ACT inhaler Inhale 2 puffs into the lungs every 6 (six) hours as needed for wheezing or shortness of breath.    [provider]  atorvastatin  (LIPITOR) 20 MG tablet Take 20 mg by mouth at bedtime.    [provider]  Black Cohosh 540 MG CAPS Take 540 mg by mouth daily.     [provider]  BREZTRI  AEROSPHERE 160-9-4.8 MCG/ACT AERO inhaler Inhale 2 puffs into the lungs 2 (two) times daily. 05/15/23   [provider]  Calcium  Carb-Cholecalciferol (CALCIUM  + D3 PO) Take 1 tablet by mouth 2 (two) times daily.    [provider]  clobetasol cream (TEMOVATE) 0.05 % Apply 1 Application topically See admin instructions. Apply to affected areas 2 times a day    [provider]  CRANBERRY PO Take 1 capsule by mouth daily.     [provider]  cyclobenzaprine  (FLEXERIL ) 5 MG tablet Take 1 tablet (5 mg total) by mouth 3 (three) times daily as needed for muscle spasms. 11/03/23   Bergman, Meghan D, NP  denosumab (PROLIA) 60 MG/ML SOSY injection Inject 60 mg into the skin every 6 (six) months.    [provider]  docusate sodium  (COLACE)  100 MG capsule Take 1 capsule (100 mg total) by mouth 2 (two) times daily. 11/03/23   Bergman, Meghan D, NP  estradiol  (ESTRACE ) 0.1 MG/GM vaginal cream Place 1 Applicatorful vaginally at bedtime. 06/26/22   [provider]  famotidine  (PEPCID ) 40 MG tablet Take 0.5 tablets (20 mg total) by mouth daily as needed for heartburn or indigestion. 10/22/23   Rashid, Farhan, MD  fosfomycin (MONUROL ) 3 g PACK Take 1 packet by mouth every 10 days 05/10/23   Stoneking, Adine PARAS., MD  Glucosamine-Chondroit-Vit C-Mn (GLUCOSAMINE-CHONDROITIN MAX ST) CAPS Take 1 capsule by mouth daily.     [provider]  HYDROcodone -acetaminophen  (NORCO/VICODIN) 5-325 MG tablet Take 1-2 tablets by mouth every 6 (six) hours as needed for moderate pain (pain score 4-6) or severe pain (pain score 7-10). 11/03/23   Bergman, Meghan D, NP  KERENDIA  10 MG TABS Take 10 mg by mouth in the morning.    [provider]  lidocaine  (LIDODERM ) 5 % APPLY 2 PATCHES TO SKIN EVERY DAY, REMOVE AND REPLACE PATCH AFTER 12 HRS 08/13/20   Raulkar, Sven SQUIBB, MD  loratadine  (CLARITIN ) 10 MG tablet Take 10 mg by mouth daily.    [provider]  Magnesium  200 MG TABS Take 200 mg by mouth daily.     [provider]  megestrol (MEGACE ES) 625 MG/5ML suspension Take 5 mLs (625 mg total) by  mouth daily. 12/11/23   Gudena, Vinay, MD  multivitamin (RENA-VIT) TABS tablet Take 1 tablet by mouth daily. 05/01/23   [provider]  mupirocin  ointment (BACTROBAN ) 2 % Place 1 Application into the nose 2 (two) times daily. 11/03/23   Bergman, Meghan D, NP  NIFEdipine  (PROCARDIA  XL/ADALAT -CC) 90 MG 24 hr tablet Take 90 mg by mouth daily.     [provider]  ondansetron  (ZOFRAN ) 4 MG tablet Take 1 tablet (4 mg total) by mouth every 6 (six) hours as needed for nausea or vomiting. 11/03/23   Bergman, Meghan D, NP  pregabalin  (LYRICA ) 50 MG capsule Take 100 mg by mouth 2 (two) times daily.    [provider]   Probiotic Product (PROBIOTIC ADVANCED PO) Take 1 capsule by mouth daily.     [provider]  solifenacin  (VESICARE ) 10 MG tablet Take 1 tablet (10 mg total) by mouth every evening. 05/10/23   Stoneking, Adine PARAS., MD  tamoxifen  (NOLVADEX ) 20 MG tablet TAKE 1 TABLET EVERY DAY 08/18/23   Odean Potts, MD  TYLENOL  8 HOUR ARTHRITIS PAIN 650 MG CR tablet Take 1,300 mg by mouth daily as needed for pain.    [provider]    Allergies: Fluorouracil, Other, Clavulanic acid, Codeine, Hydrocodone , and Penicillins    Review of Systems  Updated Vital Signs BP (!) 121/52 (BP Location: Left Arm)   Pulse 89   Temp 98.2 F (36.8 C) (Oral)   Resp 16   LMP  (LMP Unknown)   SpO2 100%   Physical Exam Vitals and nursing note reviewed.  Constitutional:      General: She is not in acute distress.    Appearance: She is well-developed. She is not ill-appearing.  HENT:     Head: Normocephalic and atraumatic.     Nose: Nose normal.     Mouth/Throat:     Mouth: Mucous membranes are moist.  Eyes:     Extraocular Movements: Extraocular movements intact.     Conjunctiva/sclera: Conjunctivae normal.     Pupils: Pupils are equal, round, and reactive to light.  Cardiovascular:     Rate and Rhythm: Normal rate and regular rhythm.     Pulses: Normal pulses.     Heart sounds: Normal heart sounds. No murmur heard. Pulmonary:     Effort: Pulmonary effort is normal. No respiratory distress.     Breath sounds: Normal breath sounds.  Abdominal:     General: Abdomen is flat.     Palpations: Abdomen is soft.     Tenderness: There is no abdominal tenderness.  Musculoskeletal:        General: Tenderness present. No swelling.     Cervical back: Normal range of motion and neck supple.     Comments: Tenderness to the left lower posterior calf and left heel  Skin:    General: Skin is warm and dry.     Capillary Refill: Capillary refill takes less than 2 seconds.     Findings: No erythema.      Comments: There is no major redness or swelling to the legs especially the left lower leg  Neurological:     General: No focal deficit present.     Mental Status: She is alert and oriented to person, place, and time.     Cranial Nerves: No cranial nerve deficit.     Sensory: No sensory deficit.     Motor: No weakness.     Coordination: Coordination normal.  Psychiatric:  Mood and Affect: Mood normal.     (all labs ordered are listed, but only abnormal results are displayed) Labs Reviewed  CBC WITH DIFFERENTIAL/PLATELET - Abnormal; Notable for the following components:      Result Value   RBC 2.33 (*)    Hemoglobin 8.3 (*)    HCT 25.6 (*)    MCV 109.9 (*)    MCH 35.6 (*)    RDW 21.5 (*)    Platelets 144 (*)    Monocytes Absolute 1.1 (*)    All other components within normal limits  BASIC METABOLIC PANEL WITH GFR - Abnormal; Notable for the following components:   Glucose, Bld 106 (*)    BUN 28 (*)    Creatinine, Ser 1.56 (*)    GFR, Estimated 32 (*)    All other components within normal limits    EKG: None  Radiology: US  Venous Img Lower  Left (DVT Study) Result Date: 12/24/2023 CLINICAL DATA:  Pain EXAM: LEFT LOWER EXTREMITY VENOUS DOPPLER ULTRASOUND TECHNIQUE: Gray-scale sonography with compression, as well as color and duplex ultrasound, were performed to evaluate the deep venous system(s) from the level of the common femoral vein through the popliteal and proximal calf veins. COMPARISON:  None Available. FINDINGS: VENOUS Normal compressibility of the common femoral, superficial femoral, and popliteal veins, as well as the visualized calf veins. Visualized portions of profunda femoral vein and great saphenous vein unremarkable. No filling defects to suggest DVT on grayscale or color Doppler imaging. Doppler waveforms show normal direction of venous flow, normal respiratory plasticity and response to augmentation. Limited views of the contralateral common femoral vein  are unremarkable. OTHER None. Limitations: none IMPRESSION: 1. No evidence of deep venous thrombosis within the left lower extremity. Electronically Signed   By: Ozell Daring M.D.   On: 12/24/2023 18:46   DG Foot Complete Left Result Date: 12/24/2023 EXAM: 3 OR MORE VIEW(S) XRAY OF THE LEFT FOOT 12/24/2023 05:46:00 PM COMPARISON: None available. CLINICAL HISTORY: 855384 Pain 144615 Pain FINDINGS: BONES AND JOINTS: No acute fracture. Surgical screw is seen in proximal first metatarsal. Surgical screw was also seen in distal second metatarsal. Moderate degenerative change is seen involving first metatarsophalangeal joint. No joint dislocation. SOFT TISSUES: Vascular calcifications are noted. IMPRESSION: 1. Moderate degenerative change involving the first metatarsophalangeal joint. 2. Surgical screws in the proximal first metatarsal and distal second metatarsal. 3. Vascular calcifications. Electronically signed by: Lynwood Seip MD 12/24/2023 06:00 PM EST RP Workstation: [id number]     Procedures   Medications Ordered in the ED - No data to display                                  Medical Decision Making Amount and/or Complexity of Data Reviewed Labs: ordered. Radiology: ordered.   Heidi Lutz is here with left lower leg swelling and pain.  Mostly the pain is in the left foot but maybe some in the lower calf area.  She has MDS started on new medicine for this that could cause clots or here to rule that out.  She has a history of CKD osteoarthritis breast cancer.  Will get DVT study x-ray of the left foot check basic labs.  Have no concern for cellulitis.  She does not appear to have any joint effusion on exam.  Have no concern for septic joint.  She got good pulses.  I have no concern for arterial process.  Overall lab work showed no significant leukocytosis anemia or electrolyte abnormality.  Creatinine at baseline.  DVT study negative for DVT.  X-ray with no acute findings.  Overall  suspect pain likely from some arthritis.  Recommend Tylenol  rest ice follow-up with primary care.  Discharge.  Have no concern for arterial process.    This chart was dictated using voice recognition software.  Despite best efforts to proofread,  errors can occur which can change the documentation meaning.      Final diagnoses:  Leg swelling    ED Discharge Orders     None          Ruthe Cornet, DO 12/24/23 1856

## 2023-12-24 NOTE — Discharge Instructions (Signed)
 Follow-up with your primary care doctor.  Return if symptoms worsen.

## 2023-12-27 ENCOUNTER — Other Ambulatory Visit: Payer: Self-pay | Admitting: Urology

## 2023-12-27 DIAGNOSIS — Z87440 Personal history of urinary (tract) infections: Secondary | ICD-10-CM

## 2023-12-28 ENCOUNTER — Ambulatory Visit: Admitting: Internal Medicine

## 2023-12-29 ENCOUNTER — Other Ambulatory Visit (HOSPITAL_COMMUNITY)

## 2023-12-29 ENCOUNTER — Ambulatory Visit (HOSPITAL_COMMUNITY)

## 2024-01-04 NOTE — Telephone Encounter (Signed)
 [place] - [place] Pending  [facility] received Benefits Verification results on 01/04/2024 for [employer] from Norleen Crease, MD.  It was determined that a Prior Authorization IS required and was submitted to [facility] on 01/03/2024. Pending Reference Number is [id number]  Benefits Verification results are:  Called [employer] at [phone] and spoke to [place] B. [place] [phone], [id number]) is covered under medical benefit but [id number] requires prior authorization. Plan has no calender year deductible. Due to the Inflation Reduction Act of 2022, [employer] is no longer providing coinsurance benefits information for Medicare Part B drugs, directing [facility] representatives to the Medicare Part B Average Sales Price listing available at [email or web address].  For this patient's plan ([employer]'s [facility] Plan), benefits have been being covered at 100% of the Medicare allowable amount for the Part B drug and recent similar claims have reflected this. Administration will be covered at 100% of the allowable amount. Plan has $4,000 calendar year max OOP with $1,472.50 accumulated thus far for 2025. Ok to [employer] ref# [id number]

## 2024-01-05 ENCOUNTER — Emergency Department (HOSPITAL_COMMUNITY)

## 2024-01-05 ENCOUNTER — Telehealth: Payer: Self-pay | Admitting: *Deleted

## 2024-01-05 ENCOUNTER — Inpatient Hospital Stay (HOSPITAL_COMMUNITY)
Admission: EM | Admit: 2024-01-05 | Discharge: 2024-01-09 | DRG: 871 | Disposition: A | Discharge status: Home Health | Source: Skilled Nursing Facility | Attending: Emergency Medicine | Admitting: Emergency Medicine

## 2024-01-05 DIAGNOSIS — D630 Anemia in neoplastic disease: Secondary | ICD-10-CM | POA: Diagnosis present

## 2024-01-05 DIAGNOSIS — Z853 Personal history of malignant neoplasm of breast: Secondary | ICD-10-CM | POA: Diagnosis not present

## 2024-01-05 DIAGNOSIS — Z923 Personal history of irradiation: Secondary | ICD-10-CM

## 2024-01-05 DIAGNOSIS — J189 Pneumonia, unspecified organism: Secondary | ICD-10-CM | POA: Diagnosis present

## 2024-01-05 DIAGNOSIS — Z7951 Long term (current) use of inhaled steroids: Secondary | ICD-10-CM

## 2024-01-05 DIAGNOSIS — E782 Mixed hyperlipidemia: Secondary | ICD-10-CM | POA: Diagnosis present

## 2024-01-05 DIAGNOSIS — M069 Rheumatoid arthritis, unspecified: Secondary | ICD-10-CM | POA: Diagnosis present

## 2024-01-05 DIAGNOSIS — R579 Shock, unspecified: Secondary | ICD-10-CM | POA: Diagnosis not present

## 2024-01-05 DIAGNOSIS — I959 Hypotension, unspecified: Secondary | ICD-10-CM

## 2024-01-05 DIAGNOSIS — E871 Hypo-osmolality and hyponatremia: Secondary | ICD-10-CM | POA: Diagnosis present

## 2024-01-05 DIAGNOSIS — E861 Hypovolemia: Principal | ICD-10-CM | POA: Diagnosis present

## 2024-01-05 DIAGNOSIS — R6521 Severe sepsis with septic shock: Secondary | ICD-10-CM | POA: Diagnosis present

## 2024-01-05 DIAGNOSIS — H532 Diplopia: Secondary | ICD-10-CM | POA: Diagnosis present

## 2024-01-05 DIAGNOSIS — Z96653 Presence of artificial knee joint, bilateral: Secondary | ICD-10-CM | POA: Diagnosis present

## 2024-01-05 DIAGNOSIS — I9589 Other hypotension: Secondary | ICD-10-CM | POA: Diagnosis present

## 2024-01-05 DIAGNOSIS — Z79899 Other long term (current) drug therapy: Secondary | ICD-10-CM

## 2024-01-05 DIAGNOSIS — E875 Hyperkalemia: Secondary | ICD-10-CM | POA: Diagnosis present

## 2024-01-05 DIAGNOSIS — Z87891 Personal history of nicotine dependence: Secondary | ICD-10-CM

## 2024-01-05 DIAGNOSIS — N1832 Chronic kidney disease, stage 3b: Secondary | ICD-10-CM | POA: Diagnosis present

## 2024-01-05 DIAGNOSIS — A419 Sepsis, unspecified organism: Secondary | ICD-10-CM | POA: Diagnosis present

## 2024-01-05 DIAGNOSIS — I7300 Raynaud's syndrome without gangrene: Secondary | ICD-10-CM | POA: Diagnosis present

## 2024-01-05 DIAGNOSIS — Z79810 Long term (current) use of selective estrogen receptor modulators (SERMs): Secondary | ICD-10-CM

## 2024-01-05 DIAGNOSIS — Z96612 Presence of left artificial shoulder joint: Secondary | ICD-10-CM | POA: Diagnosis present

## 2024-01-05 DIAGNOSIS — Z981 Arthrodesis status: Secondary | ICD-10-CM

## 2024-01-05 DIAGNOSIS — I129 Hypertensive chronic kidney disease with stage 1 through stage 4 chronic kidney disease, or unspecified chronic kidney disease: Secondary | ICD-10-CM | POA: Diagnosis present

## 2024-01-05 DIAGNOSIS — D6959 Other secondary thrombocytopenia: Secondary | ICD-10-CM | POA: Diagnosis present

## 2024-01-05 DIAGNOSIS — R54 Age-related physical debility: Secondary | ICD-10-CM | POA: Diagnosis present

## 2024-01-05 DIAGNOSIS — D469 Myelodysplastic syndrome, unspecified: Secondary | ICD-10-CM | POA: Diagnosis present

## 2024-01-05 DIAGNOSIS — N179 Acute kidney failure, unspecified: Secondary | ICD-10-CM | POA: Diagnosis present

## 2024-01-05 DIAGNOSIS — R571 Hypovolemic shock: Secondary | ICD-10-CM

## 2024-01-05 DIAGNOSIS — D849 Immunodeficiency, unspecified: Secondary | ICD-10-CM | POA: Diagnosis present

## 2024-01-05 DIAGNOSIS — Z880 Allergy status to penicillin: Secondary | ICD-10-CM

## 2024-01-05 DIAGNOSIS — D649 Anemia, unspecified: Secondary | ICD-10-CM

## 2024-01-05 DIAGNOSIS — D539 Nutritional anemia, unspecified: Secondary | ICD-10-CM | POA: Diagnosis present

## 2024-01-05 DIAGNOSIS — M810 Age-related osteoporosis without current pathological fracture: Secondary | ICD-10-CM | POA: Diagnosis present

## 2024-01-05 DIAGNOSIS — Z885 Allergy status to narcotic agent status: Secondary | ICD-10-CM

## 2024-01-05 DIAGNOSIS — Z888 Allergy status to other drugs, medicaments and biological substances status: Secondary | ICD-10-CM | POA: Diagnosis not present

## 2024-01-05 LAB — I-STAT CHEM 8, ED
BUN: 64 mg/dL — ABNORMAL HIGH (ref 8–23)
Calcium, Ion: 1.08 mmol/L — ABNORMAL LOW (ref 1.15–1.40)
Chloride: 102 mmol/L (ref 98–111)
Creatinine, Ser: 3.3 mg/dL — ABNORMAL HIGH (ref 0.44–1.00)
Glucose, Bld: 99 mg/dL (ref 70–99)
HCT: 17 % — ABNORMAL LOW (ref 36.0–46.0)
Hemoglobin: 5.8 g/dL — CL (ref 12.0–15.0)
Potassium: 5.6 mmol/L — ABNORMAL HIGH (ref 3.5–5.1)
Sodium: 132 mmol/L — ABNORMAL LOW (ref 135–145)
TCO2: 22 mmol/L (ref 22–32)

## 2024-01-05 LAB — COMPREHENSIVE METABOLIC PANEL WITH GFR
ALT: <5 U/L (ref 0–44)
AST: 21 U/L (ref 15–41)
Albumin: 3.5 g/dL (ref 3.5–5.0)
Alkaline Phosphatase: 40 U/L (ref 38–126)
Anion gap: 11 (ref 5–15)
BUN: 66 mg/dL — ABNORMAL HIGH (ref 8–23)
CO2: 22 mmol/L (ref 22–32)
Calcium: 8.4 mg/dL — ABNORMAL LOW (ref 8.9–10.3)
Chloride: 101 mmol/L (ref 98–111)
Creatinine, Ser: 3.06 mg/dL — ABNORMAL HIGH (ref 0.44–1.00)
GFR, Estimated: 14 mL/min — ABNORMAL LOW (ref >60)
Glucose, Bld: 98 mg/dL (ref 70–99)
Potassium: 6.2 mmol/L — ABNORMAL HIGH (ref 3.5–5.1)
Sodium: 133 mmol/L — ABNORMAL LOW (ref 135–145)
Total Bilirubin: 0.5 mg/dL (ref 0.0–1.2)
Total Protein: 6.4 g/dL — ABNORMAL LOW (ref 6.5–8.1)

## 2024-01-05 LAB — CK: Total CK: 75 U/L (ref 38–234)

## 2024-01-05 LAB — CBC WITH DIFFERENTIAL/PLATELET
Abs Immature Granulocytes: 0.05 K/uL (ref 0.00–0.07)
Basophils Absolute: 0.1 K/uL (ref 0.0–0.1)
Basophils Relative: 1 %
Eosinophils Absolute: 0.2 K/uL (ref 0.0–0.5)
Eosinophils Relative: 4 %
HCT: 21.9 % — ABNORMAL LOW (ref 36.0–46.0)
Hemoglobin: 7 g/dL — ABNORMAL LOW (ref 12.0–15.0)
Immature Granulocytes: 1 %
Lymphocytes Relative: 27 %
Lymphs Abs: 1.4 K/uL (ref 0.7–4.0)
MCH: 36.5 pg — ABNORMAL HIGH (ref 26.0–34.0)
MCHC: 32 g/dL (ref 30.0–36.0)
MCV: 114.1 fL — ABNORMAL HIGH (ref 80.0–100.0)
Monocytes Absolute: 1 K/uL (ref 0.1–1.0)
Monocytes Relative: 19 %
Neutro Abs: 2.6 K/uL (ref 1.7–7.7)
Neutrophils Relative %: 48 %
Platelets: 114 K/uL — ABNORMAL LOW (ref 150–400)
RBC: 1.92 MIL/uL — ABNORMAL LOW (ref 3.87–5.11)
RDW: 21.4 % — ABNORMAL HIGH (ref 11.5–15.5)
WBC: 5.3 K/uL (ref 4.0–10.5)
nRBC: 0 % (ref 0.0–0.2)

## 2024-01-05 LAB — BASIC METABOLIC PANEL WITH GFR
Anion gap: 9 (ref 5–15)
BUN: 64 mg/dL — ABNORMAL HIGH (ref 8–23)
CO2: 21 mmol/L — ABNORMAL LOW (ref 22–32)
Calcium: 8.1 mg/dL — ABNORMAL LOW (ref 8.9–10.3)
Chloride: 102 mmol/L (ref 98–111)
Creatinine, Ser: 2.88 mg/dL — ABNORMAL HIGH (ref 0.44–1.00)
GFR, Estimated: 16 mL/min — ABNORMAL LOW (ref >60)
Glucose, Bld: 102 mg/dL — ABNORMAL HIGH (ref 70–99)
Potassium: 5.6 mmol/L — ABNORMAL HIGH (ref 3.5–5.1)
Sodium: 132 mmol/L — ABNORMAL LOW (ref 135–145)

## 2024-01-05 LAB — MRSA NEXT GEN BY PCR, NASAL: MRSA by PCR Next Gen: DETECTED — AB

## 2024-01-05 LAB — PROTIME-INR
INR: 1.7 — ABNORMAL HIGH (ref 0.8–1.2)
Prothrombin Time: 20.4 s — ABNORMAL HIGH (ref 11.4–15.2)

## 2024-01-05 LAB — PREPARE RBC (CROSSMATCH)

## 2024-01-05 LAB — I-STAT CG4 LACTIC ACID, ED
Lactic Acid, Venous: 1.7 mmol/L (ref 0.5–1.9)
Lactic Acid, Venous: 2.2 mmol/L (ref 0.5–1.9)
Lactic Acid, Venous: 0.7 mmol/L (ref 0.5–1.9)

## 2024-01-05 LAB — MAGNESIUM: Magnesium: 2.2 mg/dL (ref 1.7–2.4)

## 2024-01-05 MED ORDER — MUPIROCIN 2 % EX OINT
TOPICAL_OINTMENT | Freq: Two times a day (BID) | CUTANEOUS | Status: DC
  Administered 2024-01-07: 1 via NASAL
  Filled 2024-01-05 (×4): qty 22

## 2024-01-05 MED ORDER — NOREPINEPHRINE 4 MG/250ML-% IV SOLN
0.0000 ug/min | INTRAVENOUS | Status: DC
  Administered 2024-01-05: 2 ug/min via INTRAVENOUS
  Administered 2024-01-06: 5 ug/min via INTRAVENOUS
  Administered 2024-01-07: 3 ug/min via INTRAVENOUS
  Filled 2024-01-05 (×4): qty 250

## 2024-01-05 MED ORDER — CHLORHEXIDINE GLUCONATE CLOTH 2 % EX PADS
6.0000 | MEDICATED_PAD | Freq: Every day | CUTANEOUS | Status: DC
  Administered 2024-01-05 – 2024-01-09 (×5): 6 via TOPICAL

## 2024-01-05 MED ORDER — SODIUM CHLORIDE 0.9 % IV BOLUS
500.0000 mL | Freq: Once | INTRAVENOUS | Status: AC
  Administered 2024-01-05: 500 mL via INTRAVENOUS

## 2024-01-05 MED ORDER — SODIUM CHLORIDE 0.9% IV SOLUTION: Freq: Once | INTRAVENOUS | Status: AC

## 2024-01-05 MED ORDER — MIDODRINE HCL 5 MG PO TABS: 5.0000 mg | ORAL_TABLET | Freq: Three times a day (TID) | ORAL | Status: DC

## 2024-01-05 MED ORDER — SODIUM CHLORIDE 0.9 % IV SOLN: Freq: Once | INTRAVENOUS | Status: AC

## 2024-01-05 MED ORDER — HEPARIN SODIUM (PORCINE) 5000 UNIT/ML IJ SOLN: 5000.0000 [IU] | Freq: Three times a day (TID) | INTRAMUSCULAR | Status: DC

## 2024-01-05 MED ORDER — SALINE SPRAY 0.65 % NA SOLN
1.0000 | NASAL | Status: DC | PRN
  Administered 2024-01-05: 1 via NASAL
  Filled 2024-01-05: qty 44

## 2024-01-05 MED ORDER — LACTATED RINGERS IV BOLUS
1000.0000 mL | Freq: Once | INTRAVENOUS | Status: AC
  Administered 2024-01-05: 1000 mL via INTRAVENOUS

## 2024-01-05 MED ORDER — ONDANSETRON HCL 4 MG/2ML IJ SOLN
4.0000 mg | Freq: Four times a day (QID) | INTRAMUSCULAR | Status: DC | PRN
  Administered 2024-01-07: 4 mg via INTRAVENOUS
  Filled 2024-01-05: qty 2

## 2024-01-05 MED ORDER — SODIUM CHLORIDE 0.9 % IV SOLN
500.0000 mg | INTRAVENOUS | Status: AC
Start: 2024-01-05 — End: 2024-01-08
  Administered 2024-01-05 – 2024-01-07 (×3): 500 mg via INTRAVENOUS
  Filled 2024-01-05 (×3): qty 5

## 2024-01-05 MED ORDER — DOCUSATE SODIUM 100 MG PO CAPS: 100.0000 mg | ORAL_CAPSULE | Freq: Two times a day (BID) | ORAL | Status: DC | PRN

## 2024-01-05 MED ORDER — LACTATED RINGERS IV BOLUS (SEPSIS)
1000.0000 mL | Freq: Once | INTRAVENOUS | Status: AC
  Administered 2024-01-05: 1000 mL via INTRAVENOUS

## 2024-01-05 MED ORDER — ATORVASTATIN CALCIUM 20 MG PO TABS
20.0000 mg | ORAL_TABLET | Freq: Every day | ORAL | Status: DC
  Administered 2024-01-05 – 2024-01-08 (×4): 20 mg via ORAL
  Filled 2024-01-05: qty 1
  Filled 2024-01-05 (×3): qty 2

## 2024-01-05 MED ORDER — POLYETHYLENE GLYCOL 3350 17 G PO PACK: 17.0000 g | PACK | Freq: Every day | ORAL | Status: DC | PRN

## 2024-01-05 MED ORDER — BUDESON-GLYCOPYRROL-FORMOTEROL 160-9-4.8 MCG/ACT IN AERO
2.0000 | INHALATION_SPRAY | Freq: Two times a day (BID) | RESPIRATORY_TRACT | Status: DC
  Administered 2024-01-06 – 2024-01-09 (×6): 2 via RESPIRATORY_TRACT
  Filled 2024-01-05: qty 5.9

## 2024-01-05 MED ORDER — SODIUM CHLORIDE 0.9 % IV SOLN: 250.0000 mL | INTRAVENOUS | Status: DC

## 2024-01-05 MED ORDER — SODIUM ZIRCONIUM CYCLOSILICATE 10 G PO PACK
10.0000 g | PACK | Freq: Three times a day (TID) | ORAL | Status: DC
  Administered 2024-01-05 – 2024-01-06 (×3): 10 g via ORAL
  Filled 2024-01-05 (×3): qty 1

## 2024-01-05 MED ORDER — SODIUM CHLORIDE 0.9 % IV SOLN
1.0000 g | INTRAVENOUS | Status: DC
  Administered 2024-01-06 – 2024-01-08 (×4): 1 g via INTRAVENOUS
  Filled 2024-01-05 (×5): qty 10

## 2024-01-05 NOTE — H&P (Signed)
 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 She was noted to be hypotensive, was rx 2L IVF. Hgb 7, 2 PRBC ordered. AKI associated hyperkalemia.   She remained hypotensive throughout her time in the ED w MAPs <65 consistently, prompting PCCM call for admission     Pertinent  Medical History  MDS Anemia Breast cancer  Osteoporosis  Hypercalcemia  Unintentional weight loss / protein calorie malnutritrion  Significant Hospital Events: Including procedures, antibiotic start and stop dates in addition to other pertinent events   11/14 admit to ICU for periph pressors IVF PRBC   Interim History / Subjective:  2 PRBC ordered for hgb 7. MAPs generally< 65 for the last several hours. LA improved   Objective    Blood pressure (!) 92/56, pulse 73, temperature 97.9 F (36.6 C), temperature source Oral, resp. rate 17, SpO2 96%.        Intake/Output Summary (Last 24 hours) at 01/05/2024 1926 Last data filed at 01/05/2024 1700 Gross per 24 hour  Intake --  Output 350 ml  Net -350 ml   There were no vitals filed for this visit.  Examination: General: chronically ill, thin but non-toxic appearing older adult F NAD  HENT: C colllar pink mmm  Lungs: CTAb on RA  Cardiovascular: rr s1s2 cap refill < 3 sec  Abdomen: soft ndnt  Extremities: no pitting edema BLE. Symmetrically decr muscle mass and adipose tissue   Neuro: AAOx4  GU: defer  Resolved problem list   Assessment and Plan   Hypotension MDS, AoC anemia AKI Hyperkalemia  Protein calorie malnutrition  S/p posterior cervical fusion/foraminotomy (11/02/23) Hx breast ca  -generally poor PO intake,  component of hypovolemia -LA is normal and mentation is terrific, argues somewhat against a true shock state  -no SOB, ACS sx, no dysuria no abd pain n/v no diarrhea no melena  -oupt SBP generally looks 100-110 -- do see an outpt ortho appt 01/01/24 where BP is 64/56 w HR 117  P -start periph NE, SBP 90 MAP 65  -addl 1L, is to rcv 2 PRBC. 7799 CBC  -Bcx were sent, order UA -- will defer abx for now. Trend wbc count fever curve. If BP not improving w IVF, PRBC above,  then would have low threshold (n.b. PCN allergy).  -encourage PO intake  -lokelma  -AM BMP CBC -order random cortisol -start midodrine  -cont c collar per latest NSGY instructions. Family very well versed  w this.   -hold home nifedipine    Labs   CBC: Recent Labs  Lab 01/05/24 1332 01/05/24 1439  WBC 5.3  --   NEUTROABS 2.6  --   HGB 7.0* 5.8*  HCT 21.9* 17.0*  MCV 114.1*  --   PLT 114*  --     Basic Metabolic Panel: Recent Labs  Lab 01/05/24 1332 01/05/24 1439 01/05/24 1506  NA 133* 132* 132*  K 6.2* 5.6* 5.6*  CL 101 102 102  CO2 22  --  21*  GLUCOSE 98 99 102*  BUN 66* 64* 64*  CREATININE 3.06* 3.30* 2.88*  CALCIUM  8.4*  --  8.1*  MG  --   --  2.2   GFR: CrCl cannot be calculated (Unknown ideal weight.). Recent Labs  Lab 01/05/24 1332 01/05/24 1346 01/05/24 1437 01/05/24 1748  WBC 5.3  --   --   --   LATICACIDVEN  --  1.7 2.2* 0.7    Liver Function Tests: Recent Labs  Lab 01/05/24 1332  AST 21  ALT <5  ALKPHOS 40  BILITOT 0.5  PROT 6.4*  ALBUMIN  3.5   No results for input(s): LIPASE, AMYLASE in the last 168 hours. No results for input(s): AMMONIA in the last 168 hours.  ABG    Component Value Date/Time   HCO3 25.9 10/18/2023 2059   TCO2 22 01/05/2024 1439   O2SAT 56 10/18/2023 2059     Coagulation Profile: Recent Labs  Lab 01/05/24 1332  INR 1.7*    Cardiac Enzymes: Recent Labs  Lab 01/05/24 1506  CKTOTAL 75    HbA1C: Hgb A1c MFr Bld  Date/Time Value  Ref Range Status  12/04/2023 08:37 AM 5.2 4.8 - 5.6 % Final    Comment:    (NOTE) Diagnosis of Diabetes The following HbA1c ranges recommended by the American Diabetes Association (ADA) may be used as an aid in the diagnosis of diabetes mellitus.  Hemoglobin             Suggested A1C NGSP%              Diagnosis  <5.7                   Non Diabetic  5.7-6.4                Pre-Diabetic  >6.4                   Diabetic  <7.0                   Glycemic control for                       adults with diabetes.      CBG: No results for input(s): GLUCAP in the last 168 hours.  Review of Systems:   Review of Systems  Constitutional:  Positive for malaise/fatigue. Negative for chills, diaphoresis, fever and weight loss.  Respiratory: Negative.    Cardiovascular: Negative.   Gastrointestinal: Negative.   Genitourinary: Negative.   Skin: Negative.   Neurological:  Positive for weakness. Negative for focal weakness.  Endo/Heme/Allergies:  Negative for polydipsia.     Past Medical History:  She,  has a past medical history of Age-related osteoporosis without current pathological fracture (10/31/2019), Allergic rhinitis (07/31/2007), Anemia, Aortic atherosclerosis (08/16/2022), Basal cell carcinoma (12/15/2014), Cancer (HCC) (01/2017), Cervical spondylosis (07/13/2022), Closed compression fracture of L1 lumbar vertebra, with routine healing, subsequent encounter (11/26/2013), Complication of anesthesia, Environmental allergies, Essential hypertension (09/19/2008), GAD (generalized anxiety disorder) (04/25/2017), High cholesterol, History of UTI (05/25/2022), History of vertebral compression fracture (07/16/2019), Hypercalcemia (11/22/2023), Hyperlipidemia (04/22/2009), IBS (irritable bowel syndrome) (08/18/2006), Insomnia (02/02/2010), LBBB (left bundle branch block) (03/22/2017), Lumbar degenerative disc disease (02/27/2015), Lumbar spinal stenosis (02/27/2015), Major depression  (04/25/2017), Malignant neoplasm of central portion of left breast in female, estrogen receptor positive (HCC) (03/16/2017), Mixed dyslipidemia (03/22/2017), Mixed hyperlipidemia (03/22/2017), OAB (overactive bladder) (02/2020), Osteoarthritis, Osteoarthritis of spine with radiculopathy, lumbar region (02/27/2015), Osteopenia (03/03/2017), Personal history of radiation therapy, Pleuritic chest pain (04/10/2018), PONV (postoperative nausea and vomiting), Pre-operative cardiovascular examination (03/22/2017), Raynaud disease, Raynaud's disease (08/18/2006), Rheumatoid arthritis (HCC), 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   Surgical History:   Past Surgical History:  Procedure Laterality Date   ABDOMINAL HYSTERECTOMY  1972   APPENDECTOMY  1953   BREAST BIOPSY Left 05/19/2020   benign   BREAST LUMPECTOMY Left 2019   BREAST LUMPECTOMY WITH RADIOACTIVE SEED AND SENTINEL LYMPH NODE BIOPSY Left 04/07/2017   Procedure: BREAST LUMPECTOMY WITH RADIOACTIVE SEED AND SENTINEL LYMPH NODE BIOPSY;  Surgeon: Vanderbilt Ned, MD;  Location: [facility] OR;  Service: General;  Laterality: Left;   BUNIONECTOMY  1999   COLONOSCOPY     EYE SURGERY     bil cataract   GANGLION CYST EXCISION  1968   JOINT REPLACEMENT Left 2017    reverse shoulder replacement   POSTERIOR CERVICAL FUSION/FORAMINOTOMY N/A 11/02/2023   Procedure: POSTERIOR CERVICAL FUSION/FORAMINOTOMY CERVICAL ONE-TWO;  Surgeon: Mavis Purchase, MD;  Location: [facility] OR;  Service: Neurosurgery;  Laterality: N/A;   REPLACEMENT TOTAL KNEE  2012, 2013   Right and Left   SHOULDER ARTHROSCOPY Left    SYMPATHECTOMY  1970   TONSILLECTOMY  1949     Social History:   reports that she quit smoking about 60 years ago. Her smoking use included cigarettes. She has been exposed to tobacco smoke. She  has never used smokeless tobacco. She reports current alcohol use of about 14.0 standard drinks of alcohol per week. She reports that she does not use drugs.   Family History:  Her family history is negative for Breast cancer. She was adopted.   Allergies Allergies  Allergen Reactions   Fluorouracil Hives   Other Hives, Itching, Swelling, Rash and Other (See Comments)    NO -CILLIN(s)   Clavulanic Acid Other (See Comments)    Clavulanic acid is a medication that can be used in conjunction with amoxicillin to manage and treat bacterial infections, specifically bacteria that are beta-lactamase producers. It is in the beta-lactamase inhibitor class of medications. Reaction undefined, but is allergic if it is paired with any -cillin(s)   Codeine Nausea And Vomiting   Hydrocodone  Nausea And Vomiting   Penicillins Hives, Swelling, Rash and Other (See Comments)    Pt states allergic to all cillin drugs. Joint pain.     Home Medications  Prior to Admission medications   Medication Sig Start Date End Date Taking? Authorizing Provider  albuterol  (VENTOLIN  HFA) 108 (90 Base) MCG/ACT inhaler Inhale 2 puffs into the lungs every 6 (six) hours as needed for wheezing or shortness of breath.    [provider]  atorvastatin  (LIPITOR) 20 MG tablet Take 20 mg by mouth at bedtime.    [provider]  Black Cohosh 540 MG CAPS Take 540 mg by mouth daily.     [provider]  BREZTRI  AEROSPHERE 160-9-4.8 MCG/ACT AERO inhaler Inhale 2 puffs into the lungs 2 (two) times daily. 05/15/23   [provider]  Calcium  Carb-Cholecalciferol (CALCIUM  + D3 PO) Take 1 tablet by mouth 2 (two) times daily.    [provider]  clobetasol cream (TEMOVATE) 0.05 % Apply 1 Application topically See admin instructions. Apply to affected areas 2 times a day    [provider]  CRANBERRY PO Take 1 capsule by mouth daily.     [provider]  cyclobenzaprine   (FLEXERIL ) 5 MG tablet Take 1 tablet (5 mg total) by mouth 3 (three) times daily as needed for muscle spasms. 11/03/23  Bergman, Meghan D, NP  denosumab (PROLIA) 60 MG/ML SOSY injection Inject 60 mg into the skin every 6 (six) months.    [provider]  docusate sodium  (COLACE) 100 MG capsule Take 1 capsule (100 mg total) by mouth 2 (two) times daily. 11/03/23   Bergman, Meghan D, NP  estradiol  (ESTRACE ) 0.1 MG/GM vaginal cream Place 1 Applicatorful vaginally at bedtime. 06/26/22   [provider]  famotidine  (PEPCID ) 40 MG tablet Take 0.5 tablets (20 mg total) by mouth daily as needed for heartburn or indigestion. 10/22/23   Rashid, Farhan, MD  fosfomycin (MONUROL ) 3 g PACK MIX AND STIR 1 PACKET INTO 3-4 OZ OF WATER AND DRINK EVERY 10 DAYS AS DIRECTED. TAKE IMMEDIATELY AFTER DISSOLVING 12/27/23   Stoneking, Adine PARAS., MD  Glucosamine-Chondroit-Vit C-Mn (GLUCOSAMINE-CHONDROITIN MAX ST) CAPS Take 1 capsule by mouth daily.     [provider]  HYDROcodone -acetaminophen  (NORCO/VICODIN) 5-325 MG tablet Take 1-2 tablets by mouth every 6 (six) hours as needed for moderate pain (pain score 4-6) or severe pain (pain score 7-10). 11/03/23   Bergman, Meghan D, NP  KERENDIA  10 MG TABS Take 10 mg by mouth in the morning.    [provider]  lidocaine  (LIDODERM ) 5 % APPLY 2 PATCHES TO SKIN EVERY DAY, REMOVE AND REPLACE PATCH AFTER 12 HRS 08/13/20   Raulkar, Sven SQUIBB, MD  loratadine  (CLARITIN ) 10 MG tablet Take 10 mg by mouth daily.    [provider]  Magnesium  200 MG TABS Take 200 mg by mouth daily.     [provider]  megestrol (MEGACE ES) 625 MG/5ML suspension Take 5 mLs (625 mg total) by mouth daily. 12/11/23   Gudena, Vinay, MD  multivitamin (RENA-VIT) TABS tablet Take 1 tablet by mouth daily. 05/01/23   [provider]  mupirocin  ointment (BACTROBAN ) 2 % Place 1 Application into the nose 2 (two) times daily. 11/03/23   Bergman, Meghan D, NP   NIFEdipine  (PROCARDIA  XL/ADALAT -CC) 90 MG 24 hr tablet Take 90 mg by mouth daily.     [provider]  ondansetron  (ZOFRAN ) 4 MG tablet Take 1 tablet (4 mg total) by mouth every 6 (six) hours as needed for nausea or vomiting. 11/03/23   Bergman, Meghan D, NP  pregabalin  (LYRICA ) 50 MG capsule Take 100 mg by mouth 2 (two) times daily.    [provider]  Probiotic Product (PROBIOTIC ADVANCED PO) Take 1 capsule by mouth daily.     [provider]  solifenacin  (VESICARE ) 10 MG tablet Take 1 tablet (10 mg total) by mouth every evening. 05/10/23   Stoneking, Adine PARAS., MD  tamoxifen  (NOLVADEX ) 20 MG tablet TAKE 1 TABLET EVERY DAY 08/18/23   Odean Potts, MD  TYLENOL  8 HOUR ARTHRITIS PAIN 650 MG CR tablet Take 1,300 mg by mouth daily as needed for pain.    [provider]     Critical care time: 40 min       CRITICAL CARE Performed by: Ronnald FORBES Gave   Total critical care time: 40 minutes  Critical care time was exclusive of separately billable procedures and treating other patients. Critical care was necessary to treat or prevent imminent or life-threatening deterioration.  Critical care was time spent personally by me on the following activities: development of treatment plan with patient and/or surrogate as well as nursing, discussions with consultants, evaluation of patient's response to treatment, examination of patient, obtaining history from patient or surrogate, ordering and performing treatments and interventions, ordering and review  of laboratory studies, ordering and review of radiographic studies, pulse oximetry and re-evaluation of patient's condition.  Ronnald Gave MSN, AGACNP-BC [facility] Amion for pager  01/05/2024, 7:26 PM

## 2024-01-05 NOTE — Progress Notes (Signed)
 eLink Physician-Brief Progress Note Patient Name: Heidi Lutz DOB: 1940/09/16 MRN: [id number]   Date of Service  01/05/2024  HPI/Events of Note  Patient admitted with severe anemia, hypotension, AKI, and hyperkalemia, she has a history of myelodysplastic syndrome.  eICU Interventions  New Patient Evaluation.        Heidi Lutz Heidi Lutz 01/05/2024, 11:39 PM

## 2024-01-05 NOTE — Telephone Encounter (Signed)
 Received call from pt spouse stating pt is extremely pale and weak x several days.  MD out of office.  RN reached out to [facility] who states they do not have availability to see pt today.  Due to pts symptoms and spousal concern, pt needing to go to ED for further evaluation.  Pt and spouse educated and verbalized understanding.

## 2024-01-05 NOTE — ED Triage Notes (Signed)
 BIB family with concern for paleness/weakness, hypotension, and possibly needing another blood transfusion. Has a hx of low blood cells/blood disorder. Last blood transfusion in October.

## 2024-01-05 NOTE — ED Notes (Signed)
 Blood bank has blood ready for this patient.  Notified Ian,EMT-P and he acknowleged.

## 2024-01-06 ENCOUNTER — Other Ambulatory Visit: Payer: Self-pay

## 2024-01-06 DIAGNOSIS — N179 Acute kidney failure, unspecified: Secondary | ICD-10-CM | POA: Diagnosis not present

## 2024-01-06 DIAGNOSIS — J189 Pneumonia, unspecified organism: Secondary | ICD-10-CM | POA: Diagnosis not present

## 2024-01-06 DIAGNOSIS — R579 Shock, unspecified: Secondary | ICD-10-CM | POA: Diagnosis not present

## 2024-01-06 LAB — CBC
HCT: 26.8 % — ABNORMAL LOW (ref 36.0–46.0)
HCT: 32.1 % — ABNORMAL LOW (ref 36.0–46.0)
Hemoglobin: 8.9 g/dL — ABNORMAL LOW (ref 12.0–15.0)
Hemoglobin: 10.5 g/dL — ABNORMAL LOW (ref 12.0–15.0)
MCH: 34.8 pg — ABNORMAL HIGH (ref 26.0–34.0)
MCH: 33.4 pg (ref 26.0–34.0)
MCHC: 33.2 g/dL (ref 30.0–36.0)
MCHC: 32.7 g/dL (ref 30.0–36.0)
MCV: 104.7 fL — ABNORMAL HIGH (ref 80.0–100.0)
MCV: 102.2 fL — ABNORMAL HIGH (ref 80.0–100.0)
Platelets: 106 K/uL — ABNORMAL LOW (ref 150–400)
Platelets: 103 K/uL — ABNORMAL LOW (ref 150–400)
RBC: 2.56 MIL/uL — ABNORMAL LOW (ref 3.87–5.11)
RBC: 3.14 MIL/uL — ABNORMAL LOW (ref 3.87–5.11)
RDW: 23.7 % — ABNORMAL HIGH (ref 11.5–15.5)
RDW: 24 % — ABNORMAL HIGH (ref 11.5–15.5)
WBC: 4.9 K/uL (ref 4.0–10.5)
WBC: 6 K/uL (ref 4.0–10.5)
nRBC: 0 % (ref 0.0–0.2)
nRBC: 0 % (ref 0.0–0.2)

## 2024-01-06 LAB — CORTISOL: Cortisol, Plasma: 6.7 ug/dL

## 2024-01-06 LAB — BASIC METABOLIC PANEL WITH GFR
Anion gap: 11 (ref 5–15)
BUN: 45 mg/dL — ABNORMAL HIGH (ref 8–23)
CO2: 21 mmol/L — ABNORMAL LOW (ref 22–32)
Calcium: 8 mg/dL — ABNORMAL LOW (ref 8.9–10.3)
Chloride: 108 mmol/L (ref 98–111)
Creatinine, Ser: 2.08 mg/dL — ABNORMAL HIGH (ref 0.44–1.00)
GFR, Estimated: 23 mL/min — ABNORMAL LOW (ref >60)
Glucose, Bld: 108 mg/dL — ABNORMAL HIGH (ref 70–99)
Potassium: 4.6 mmol/L (ref 3.5–5.1)
Sodium: 139 mmol/L (ref 135–145)

## 2024-01-06 LAB — GLUCOSE, CAPILLARY: Glucose-Capillary: 110 mg/dL — ABNORMAL HIGH (ref 70–99)

## 2024-01-06 MED ORDER — HEPARIN SODIUM (PORCINE) 5000 UNIT/ML IJ SOLN
5000.0000 [IU] | Freq: Three times a day (TID) | INTRAMUSCULAR | Status: DC
  Administered 2024-01-06 – 2024-01-08 (×6): 5000 [IU] via SUBCUTANEOUS
  Filled 2024-01-06 (×6): qty 1

## 2024-01-06 MED ORDER — PANTOPRAZOLE SODIUM 40 MG IV SOLR
40.0000 mg | Freq: Every day | INTRAVENOUS | Status: DC
  Administered 2024-01-06 – 2024-01-07 (×2): 40 mg via INTRAVENOUS
  Filled 2024-01-06 (×2): qty 10

## 2024-01-06 MED ORDER — LINEZOLID 600 MG/300ML IV SOLN
600.0000 mg | Freq: Two times a day (BID) | INTRAVENOUS | Status: DC
  Administered 2024-01-06 – 2024-01-09 (×7): 600 mg via INTRAVENOUS
  Filled 2024-01-06 (×7): qty 300

## 2024-01-06 MED ORDER — MELATONIN 3 MG PO TABS
3.0000 mg | ORAL_TABLET | Freq: Every evening | ORAL | Status: AC | PRN
Start: 2024-01-06 — End: 2024-01-09
  Administered 2024-01-06 – 2024-01-08 (×4): 3 mg via ORAL
  Filled 2024-01-06 (×4): qty 1

## 2024-01-06 MED ORDER — ENSURE PLUS HIGH PROTEIN PO LIQD
237.0000 mL | Freq: Two times a day (BID) | ORAL | Status: DC
  Administered 2024-01-06 – 2024-01-09 (×7): 237 mL via ORAL

## 2024-01-06 MED ORDER — LACTATED RINGERS IV SOLN: INTRAVENOUS | Status: AC

## 2024-01-06 MED ORDER — ZINC OXIDE 40 % EX OINT
TOPICAL_OINTMENT | CUTANEOUS | Status: DC | PRN
  Filled 2024-01-06: qty 57

## 2024-01-06 NOTE — ED Provider Notes (Incomplete)
 [facility]-ICU/STEPDOWN Provider Note   CSN: [id number] Arrival date & time: 01/05/24  1230     Patient presents with: Weakness   Heidi Lutz is a 83 y.o. female history of MDS, osteoarthritis, breast cancer in remission, hypertension   Weakness      Prior to Admission medications   Medication Sig Start Date End Date Taking? Authorizing Provider  albuterol  (VENTOLIN  HFA) 108 (90 Base) MCG/ACT inhaler Inhale 2 puffs into the lungs every 6 (six) hours as needed for wheezing or shortness of breath.   Yes [provider]  atorvastatin  (LIPITOR) 20 MG tablet Take 20 mg by mouth at bedtime.   Yes [provider]  Black Cohosh 540 MG CAPS Take 540 mg by mouth daily.    Yes [provider]  BREZTRI  AEROSPHERE 160-9-4.8 MCG/ACT AERO inhaler Inhale 2 puffs into the lungs 2 (two) times daily as needed (for flares). 05/15/23  Yes [provider]  Calcium  Alginate-Silver (DYNAGINATE AG SILVER CAL 4X5 EX) Apply topically See admin instructions. Cut to size and apply to wound on right gluteal cheek with dressing change once a day   Yes [provider]  denosumab (PROLIA) 60 MG/ML SOSY injection Inject 60 mg into the skin every 6 (six) months.   Yes [provider]  famotidine  (PEPCID ) 40 MG tablet Take 0.5 tablets (20 mg total) by mouth daily as needed for heartburn or indigestion. Patient taking differently: Take 20 mg by mouth at bedtime. 10/22/23  Yes Rashid, Farhan, MD  fosfomycin (MONUROL ) 3 g PACK MIX AND STIR 1 PACKET INTO 3-4 OZ OF WATER AND DRINK EVERY 10 DAYS AS DIRECTED. TAKE IMMEDIATELY AFTER DISSOLVING Patient taking differently: Take 3 g by mouth See admin instructions. MIX AND STIR 3 GRAMS (1 PACKET) INTO 3-4 OZ OF WATER AND DRINK BY MOUTH EVERY 10 DAYS AS DIRECTED. DRINK. IMMEDIATELY AFTER DISSOLVING. 12/27/23  Yes Stoneking, Adine PARAS., MD  Glucosamine-Chondroit-Vit C-Mn (GLUCOSAMINE-CHONDROITIN MAX  ST) CAPS Take 1 capsule by mouth daily.    Yes [provider]  HYDROcodone -acetaminophen  (NORCO/VICODIN) 5-325 MG tablet Take 1-2 tablets by mouth every 6 (six) hours as needed for moderate pain (pain score 4-6) or severe pain (pain score 7-10). 11/03/23  Yes Bergman, Meghan D, NP  KERENDIA  10 MG TABS Take 10 mg by mouth in the morning.   Yes [provider]  loratadine  (CLARITIN ) 10 MG tablet Take 10 mg by mouth daily as needed for allergies or rhinitis.   Yes [provider]  multivitamin (RENA-VIT) TABS tablet Take 1 tablet by mouth daily. 05/01/23  Yes [provider]  NIFEdipine  (PROCARDIA  XL/ADALAT -CC) 90 MG 24 hr tablet Take 90 mg by mouth See admin instructions. Take 90 mg by mouth in the morning and HOLD FOR A LOW B/P   Yes [provider]  pregabalin  (LYRICA ) 50 MG capsule Take 100 mg by mouth at bedtime.   Yes [provider]  Probiotic Product (PROBIOTIC ADVANCED PO) Take 1 capsule by mouth daily.    Yes [provider]  solifenacin  (VESICARE ) 10 MG tablet Take 1 tablet (10 mg total) by mouth every evening. 05/10/23  Yes Stoneking, Adine PARAS., MD  tamoxifen  (NOLVADEX ) 20 MG tablet TAKE 1 TABLET EVERY DAY 08/18/23  Yes Odean Potts, MD  TYLENOL  8 HOUR ARTHRITIS PAIN 650 MG CR tablet Take 1,300 mg by mouth daily as needed for pain.   Yes [provider]  cyclobenzaprine  (FLEXERIL ) 5 MG tablet Take 1 tablet (5  mg total) by mouth 3 (three) times daily as needed for muscle spasms. Patient not taking: Reported on 01/05/2024 11/03/23   Bergman, Meghan D, NP  docusate sodium  (COLACE) 100 MG capsule Take 1 capsule (100 mg total) by mouth 2 (two) times daily. Patient not taking: Reported on 01/05/2024 11/03/23   Bergman, Meghan D, NP  lidocaine  (LIDODERM ) 5 % APPLY 2 PATCHES TO SKIN EVERY DAY, REMOVE AND REPLACE PATCH AFTER 12 HRS Patient not taking: Reported on 01/05/2024 08/13/20   Lorilee Sven SQUIBB, MD  megestrol (MEGACE ES)  625 MG/5ML suspension Take 5 mLs (625 mg total) by mouth daily. Patient not taking: Reported on 01/05/2024 12/11/23   Gudena, Vinay, MD  mupirocin  ointment (BACTROBAN ) 2 % Place 1 Application into the nose 2 (two) times daily. Patient not taking: Reported on 01/05/2024 11/03/23   Bergman, Meghan D, NP  ondansetron  (ZOFRAN ) 4 MG tablet Take 1 tablet (4 mg total) by mouth every 6 (six) hours as needed for nausea or vomiting. Patient not taking: Reported on 01/05/2024 11/03/23   Bergman, Meghan D, NP    Allergies: Fluorouracil, Other, Clavulanic acid, Codeine, Hydrocodone , and Penicillins    Review of Systems  Neurological:  Positive for weakness.    Updated Vital Signs BP (!) 106/49   Pulse 71   Temp 98.3 F (36.8 C) (Oral)   Resp 15   LMP  (LMP Unknown)   SpO2 96%   Physical Exam  (all labs ordered are listed, but only abnormal results are displayed) Labs Reviewed  MRSA NEXT GEN BY PCR, NASAL - Abnormal; Notable for the following components:      Result Value   MRSA by PCR Next Gen DETECTED (*)    All other components within normal limits  COMPREHENSIVE METABOLIC PANEL WITH GFR - Abnormal; Notable for the following components:   Sodium 133 (*)    Potassium 6.2 (*)    BUN 66 (*)    Creatinine, Ser 3.06 (*)    Calcium  8.4 (*)    Total Protein 6.4 (*)    GFR, Estimated 14 (*)    All other components within normal limits  CBC WITH DIFFERENTIAL/PLATELET - Abnormal; Notable for the following components:   RBC 1.92 (*)    Hemoglobin 7.0 (*)    HCT 21.9 (*)    MCV 114.1 (*)    MCH 36.5 (*)    RDW 21.4 (*)    Platelets 114 (*)    All other components within normal limits  PROTIME-INR - Abnormal; Notable for the following components:   Prothrombin Time 20.4 (*)    INR 1.7 (*)    All other components within normal limits  BASIC METABOLIC PANEL WITH GFR - Abnormal; Notable for the following components:   Sodium 132 (*)    Potassium 5.6 (*)    CO2 21 (*)    Glucose, Bld 102  (*)    BUN 64 (*)    Creatinine, Ser 2.88 (*)    Calcium  8.1 (*)    GFR, Estimated 16 (*)    All other components within normal limits  I-STAT CHEM 8, ED - Abnormal; Notable for the following components:   Sodium 132 (*)    Potassium 5.6 (*)    BUN 64 (*)    Creatinine, Ser 3.30 (*)    Calcium , Ion 1.08 (*)    Hemoglobin 5.8 (*)    HCT 17.0 (*)    All other components within normal limits  I-STAT CG4 LACTIC ACID,  ED - Abnormal; Notable for the following components:   Lactic Acid, Venous 2.2 (*)    All other components within normal limits  CULTURE, BLOOD (ROUTINE X 2)  CULTURE, BLOOD (ROUTINE X 2)  MAGNESIUM   CK  URINALYSIS, W/ REFLEX TO CULTURE (INFECTION SUSPECTED)  URINALYSIS, ROUTINE W REFLEX MICROSCOPIC  CBC  BASIC METABOLIC PANEL WITH GFR  CBC  CORTISOL  I-STAT CG4 LACTIC ACID, ED  I-STAT CG4 LACTIC ACID, ED  I-STAT CG4 LACTIC ACID, ED  TYPE AND SCREEN  PREPARE RBC (CROSSMATCH)    EKG: EKG Interpretation Date/Time:  Friday January 05 2024 13:10:13 EST Ventricular Rate:  70 PR Interval:  203 QRS Duration:  131 QT Interval:  452 QTC Calculation: 488 R Axis:   19  Text Interpretation: Sinus rhythm Left bundle branch block Confirmed by Towana Sharper [phone]) on 01/05/2024 1:12:15 PM  Radiology: [facility] Chest Port 1 View Result Date: 01/05/2024 EXAM: 1 VIEW(S) XRAY OF THE CHEST 01/05/2024 02:09:00 PM COMPARISON: None available. CLINICAL HISTORY: Questionable sepsis - evaluate for abnormality FINDINGS: LUNGS AND PLEURA: Hazy opacity at left lung base, likely atelectasis. No pleural effusion. No pneumothorax. HEART AND MEDIASTINUM: Aortic atherosclerosis. No acute abnormality of the cardiac and mediastinal silhouettes. BONES AND SOFT TISSUES: Surgical clips in left breast. Degenerative changes of right shoulder. Left reverse total shoulder arthroplasty noted. IMPRESSION: 1. Hazy opacity at the left lung base, likely atelectasis. 2. Aortic Atherosclerosis (ICD10-I70.0).  Electronically signed by: Ryan Chess MD 01/05/2024 02:33 PM EST RP Workstation: [id number]    {Document cardiac monitor, telemetry assessment procedure when appropriate:32947} Procedures   Medications Ordered in the ED  sodium zirconium cyclosilicate (LOKELMA) packet 10 g (10 g Oral Given 01/05/24 2257)  norepinephrine (LEVOPHED) 4mg  in (0.016 mg/mL) premix infusion (3 mcg/min Intravenous Infusion Verify 01/05/24 2358)  Chlorhexidine  Gluconate Cloth 2 % PADS 6 each (6 each Topical Given 01/05/24 2030)  docusate sodium  (COLACE) capsule 100 mg (has no administration in time range)  polyethylene glycol (MIRALAX / GLYCOLAX) packet 17 g (has no administration in time range)  ondansetron  (ZOFRAN ) injection 4 mg (has no administration in time range)  budesonide -glycopyrrolate -formoterol  (BREZTRI ) 160-9-4.8 MCG/ACT inhaler 2 puff (2 puffs Inhalation Not Given 01/05/24 2201)  atorvastatin  (LIPITOR) tablet 20 mg (20 mg Oral Given 01/05/24 2258)  azithromycin (ZITHROMAX) 500 mg in sodium chloride  0.9 % 250 mL IVPB ( Intravenous Infusion Verify 01/05/24 2358)  cefTRIAXone (ROCEPHIN) 1 g in sodium chloride  0.9 % 100 mL IVPB (has no administration in time range)  sodium chloride  (OCEAN) 0.65 % nasal spray 1 spray (1 spray Each Nare Given 01/05/24 2302)  mupirocin  ointment (BACTROBAN ) 2 % (has no administration in time range)  lactated ringers  bolus 1,000 mL (0 mLs Intravenous Stopped 01/05/24 1540)  0.9 %  sodium chloride  infusion (0 mLs Intravenous Stopped 01/05/24 2130)  0.9 %  sodium chloride  infusion (Manually program via Guardrails IV Fluids) (0 mLs Intravenous Stopped 01/05/24 1831)  sodium chloride  0.9 % bolus 500 mL (0 mLs Intravenous Stopped 01/05/24 1647)  sodium chloride  0.9 % bolus 500 mL (0 mLs Intravenous Stopped 01/05/24 1831)  lactated ringers  bolus 1,000 mL (1,000 mLs Intravenous New Bag/Given 01/05/24 1950)    Clinical Course as of 01/06/24 0000  Fri Jan 05, 2024  1450  Discussed with patient and family results, next steps, and need for admission. Waiting on recommendations from nephrology and oncology  [RR]  [phone] Spoke with Dr. Odean with oncology. Agrees with plan for admission, transfusion, and fluids.  [RR]  1511 Spoke with Dr. Geralynn with nephrology. Recommends renal diet, Lokelma 10g TID until potassium is <5.0, and continue with IVF. [RR]  578 83 year old female here with general weakness fatigue in the setting of cancer and anemia.  Hemoglobin low again along with blood pressure, worsening renal function and potassium level.  Will need transfusion and fluids, treatment for low potassium, admission. [MB]  1841 Spoke with Critical Care who will come down and evaluate the patient  [RR]    Clinical Course User Index [MB] Towana Ozell BROCKS, MD [RR] Bernis Ernst, PA-C   {Click here for ABCD2, HEART and other calculators REFRESH Note before signing:1}                              Medical Decision Making Amount and/or Complexity of Data Reviewed Labs: ordered. Radiology: ordered.  Risk Prescription drug management. Decision regarding hospitalization.   ***  {Document critical care time when appropriate  Document review of labs and clinical decision tools ie [id number], etc  Document your independent review of radiology images and any outside records  Document your discussion with family members, caretakers and with consultants  Document social determinants of health affecting pt's care  Document your decision making why or why not admission, treatments were needed:32947:::1}   Final diagnoses:  None    ED Discharge Orders     None

## 2024-01-06 NOTE — Plan of Care (Signed)

## 2024-01-06 NOTE — Progress Notes (Signed)
 NAME:  Heidi Lutz, MRN:  [id number], DOB:  [birth year], LOS: 1 ADMISSION DATE:  01/05/2024, CONSULTATION DATE:  @TODAY @ REFERRING MD:  ED, CHIEF COMPLAINT:  shock state   History of Present Illness:  Heidi Lutz is an 84 y/o F with a PMH significant for MDS, remote hx of breast cancer, and  protein calorie malnutrition who presents for lethargy found to have symptomatic anemia and hypovolemic shock requiring ICU level care for hemodynamic monitoring.   HPI: She has been experiencing fatigue and low blood pressure since Tuesday. Despite having a good sleep on Tuesday, she felt fatigued and sleepy on Wednesday, with a reduced appetite. By Thursday, her low blood pressure prevented her from participating in physical therapy. She experienced dizziness and double vision on Thursday, although the double vision resolved by Friday. No fevers, chills, night sweats, shortness of breath, cough, or urinary symptoms. She experienced nausea on Tuesday afternoon, attributing it to overexertion during physical therapy and insufficient food intake before taking her medications.   She has a history of myelodysplastic syndrome, diagnosed in October 2025. She received a Retacrit injection two weeks ago, with a hemoglobin count of 8.4 at that time. She has had three blood transfusions since July 2025. She denies having a port and reports no recent blood clots despite a recent episode of leg swelling. She reports that she underwent extensive scans and a bone marrow biopsy as part of her workup for MDS.   In July 2025, she fell and fractured her neck in two places, requiring surgery and subsequent rehabilitation. She wears a C collar when in a car for neck support, as recommended after her neck surgery. Her calcium  levels were high after a recent blood transfusion, for which she received a bisphosphonate infusion to lower it. She denies any history of high blood pressure, although her blood pressure has been low since  her surgery. She is not on any blood thinners and has not noticed any blood in her stool.   She has an allergy to all 'penicillins', which causes a rash. She recently experienced a rash after a shot, which was managed with Benadryl. She is not currently on calcium  supplements.  Pertinent  Medical History  MDS Chronic anemia Breast Cancer Osteoporosis Hypercalcemia  Significant Hospital Events: Including procedures, antibiotic start and stop dates in addition to other pertinent events   01/05/24 --> admitted for shock state despite IVF resuscitation 01/06/24 --> remains on vasopressors  Interim History / Subjective:  Feels improved today. Fatigue improved.   Objective    Blood pressure (!) 118/45, pulse 77, temperature 97.8 F (36.6 C), temperature source Axillary, resp. rate (!) 21, height 5' 2.5 (1.588 m), weight 50.7 kg, SpO2 99%.        Intake/Output Summary (Last 24 hours) at 01/06/2024 1045 Last data filed at 01/06/2024 0948 Gross per 24 hour  Intake 1244.96 ml  Output 1050 ml  Net 194.96 ml   Filed Weights   01/06/24 0000 01/06/24 0500  Weight: 50.7 kg 50.7 kg    Examination: General: thin, frail, woman, not in any distress HENT: anicteric sclera, well injected conjunctivae Lungs: CTAB Cardiovascular: RRR, no murmurs Abdomen: soft, non-tender Extremities: no edema Neuro: alert, oriented x 3, no focal deficits GU: Deferred  Resolved problem list   Assessment and Plan   Cardiovascular:  #Shock: likely hypovolemic and septic shock due to suspected pneumonia in an immunocompromised host. - Wean levophed as tolerated - Patient already adequately IVF resuscitated - Target MAP >  65 mmHg  #CAP: MRSA swab + - Linezolid (end date: 01/10/2024) - Ceftriaxone (end date: 01/09/2024) - Azithromycin (end date: 01/07/2024)  #Stress Ulcer PPx: PPI  #Nutrition: regular diet  #Hyperkalemia: resolved.  #AKI: likely pre-renal and improved with IVF  resuscitation - Renally adjust medications - Avoid Nephrotoxins - Strict I/Os - External urinary device  #VTE ppx: heparin  subQ TID  #MDS: #Chronic Anemia: - Transfuse as needed for Hgb < 7 mg/dL  Disposition: ICU appropriate for vasopressors.  Labs   CBC: Recent Labs  Lab 01/05/24 1332 01/05/24 1439 01/05/24 2241 01/06/24 0727  WBC 5.3  --  4.9 6.0  NEUTROABS 2.6  --   --   --   HGB 7.0* 5.8* 8.9* 10.5*  HCT 21.9* 17.0* 26.8* 32.1*  MCV 114.1*  --  104.7* 102.2*  PLT 114*  --  106* 103*    Basic Metabolic Panel: Recent Labs  Lab 01/05/24 1332 01/05/24 1439 01/05/24 1506 01/06/24 0727  NA 133* 132* 132* 139  K 6.2* 5.6* 5.6* 4.6  CL 101 102 102 108  CO2 22  --  21* 21*  GLUCOSE 98 99 102* 108*  BUN 66* 64* 64* 45*  CREATININE 3.06* 3.30* 2.88* 2.08*  CALCIUM  8.4*  --  8.1* 8.0*  MG  --   --  2.2  --    GFR: Estimated Creatinine Clearance: 16.1 mL/min (A) (by C-G formula based on SCr of 2.08 mg/dL (H)). Recent Labs  Lab 01/05/24 1332 01/05/24 1346 01/05/24 1437 01/05/24 1748 01/05/24 2241 01/06/24 0727  WBC 5.3  --   --   --  4.9 6.0  LATICACIDVEN  --  1.7 2.2* 0.7  --   --     Liver Function Tests: Recent Labs  Lab 01/05/24 1332  AST 21  ALT <5  ALKPHOS 40  BILITOT 0.5  PROT 6.4*  ALBUMIN  3.5   No results for input(s): LIPASE, AMYLASE in the last 168 hours. No results for input(s): AMMONIA in the last 168 hours.  ABG    Component Value Date/Time   HCO3 25.9 10/18/2023 2059   TCO2 22 01/05/2024 1439   O2SAT 56 10/18/2023 2059     Coagulation Profile: Recent Labs  Lab 01/05/24 1332  INR 1.7*    Cardiac Enzymes: Recent Labs  Lab 01/05/24 1506  CKTOTAL 75    HbA1C: Hgb A1c MFr Bld  Date/Time Value Ref Range Status  12/04/2023 08:37 AM 5.2 4.8 - 5.6 % Final    Comment:    (NOTE) Diagnosis of Diabetes The following HbA1c ranges recommended by the American Diabetes Association (ADA) may be used as an aid in the  diagnosis of diabetes mellitus.  Hemoglobin             Suggested A1C NGSP%              Diagnosis  <5.7                   Non Diabetic  5.7-6.4                Pre-Diabetic  >6.4                   Diabetic  <7.0                   Glycemic control for                       adults with diabetes.  CBG: Recent Labs  Lab 01/06/24 0745  GLUCAP 110*    Review of Systems:   Not obtained  Past Medical History:  She,  has a past medical history of Age-related osteoporosis without current pathological fracture (10/31/2019), Allergic rhinitis (07/31/2007), Anemia, Aortic atherosclerosis (08/16/2022), Basal cell carcinoma (12/15/2014), Cancer (HCC) (01/2017), Cervical spondylosis (07/13/2022), Closed compression fracture of L1 lumbar vertebra, with routine healing, subsequent encounter (11/26/2013), Complication of anesthesia, Environmental allergies, Essential hypertension (09/19/2008), GAD (generalized anxiety disorder) (04/25/2017), High cholesterol, History of UTI (05/25/2022), History of vertebral compression fracture (07/16/2019), Hypercalcemia (11/22/2023), Hyperlipidemia (04/22/2009), IBS (irritable bowel syndrome) (08/18/2006), Insomnia (02/02/2010), LBBB (left bundle branch block) (03/22/2017), Lumbar degenerative disc disease (02/27/2015), Lumbar spinal stenosis (02/27/2015), Major depression (04/25/2017), Malignant neoplasm of central portion of left breast in female, estrogen receptor positive (HCC) (03/16/2017), Mixed dyslipidemia (03/22/2017), Mixed hyperlipidemia (03/22/2017), OAB (overactive bladder) (02/2020), Osteoarthritis, Osteoarthritis of spine with radiculopathy, lumbar region (02/27/2015), Osteopenia (03/03/2017), Personal history of radiation therapy, Pleuritic chest pain (04/10/2018), PONV (postoperative nausea and vomiting), Pre-operative cardiovascular examination (03/22/2017), Raynaud disease, Raynaud's disease (08/18/2006), Rheumatoid arthritis (HCC), 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   Surgical History:   Past Surgical History:  Procedure Laterality Date   ABDOMINAL HYSTERECTOMY  1972   APPENDECTOMY  1953   BREAST BIOPSY Left 05/19/2020   benign   BREAST LUMPECTOMY Left 2019   BREAST LUMPECTOMY WITH RADIOACTIVE SEED AND SENTINEL LYMPH NODE BIOPSY Left 04/07/2017   Procedure: BREAST LUMPECTOMY WITH RADIOACTIVE SEED AND SENTINEL LYMPH NODE BIOPSY;  Surgeon: Vanderbilt Ned, MD;  Location: [facility] OR;  Service: General;  Laterality: Left;   BUNIONECTOMY  1999   COLONOSCOPY     EYE SURGERY     bil cataract   GANGLION CYST EXCISION  1968   JOINT REPLACEMENT Left 2017    reverse shoulder replacement   POSTERIOR CERVICAL FUSION/FORAMINOTOMY N/A 11/02/2023   Procedure: POSTERIOR CERVICAL FUSION/FORAMINOTOMY CERVICAL ONE-TWO;  Surgeon: Mavis Purchase, MD;  Location[facility];  Service: Neurosurgery;  Laterality: N/A;   REPLACEMENT TOTAL KNEE  2012, 2013   Right and Left   SHOULDER ARTHROSCOPY Left    SYMPATHECTOMY  1970   TONSILLECTOMY  1949     Social History:   reports that she quit smoking about 60 years ago. Her smoking use included cigarettes. She has been exposed to tobacco smoke. She has never used smokeless tobacco. She reports current alcohol use of about 14.0 standard drinks of alcohol per week. She reports that she does not use drugs.   Family History:  Her family history is negative for Breast cancer. She was adopted.   Allergies Allergies  Allergen Reactions   Fluorouracil Hives   Other Hives, Itching, Swelling, Rash and Other (See Comments)    NO -CILLIN(s)   Clavulanic Acid Other (See Comments)    Clavulanic acid is a medication that can be used in conjunction with amoxicillin to manage and treat bacterial infections, specifically bacteria that are  beta-lactamase producers. It is in the beta-lactamase inhibitor class of medications. Reaction undefined, but is allergic if it is paired with any -cillin(s)   Codeine Nausea And Vomiting   Hydrocodone  Nausea And Vomiting   Penicillins Hives, Swelling, Rash and Other (See Comments)    Pt states allergic to all cillin drugs and Joint pain     Home Medications  Prior to Admission medications   Medication Sig Start  Date End Date Taking? Authorizing Provider  albuterol  (VENTOLIN  HFA) 108 (90 Base) MCG/ACT inhaler Inhale 2 puffs into the lungs every 6 (six) hours as needed for wheezing or shortness of breath.   Yes [provider]  atorvastatin  (LIPITOR) 20 MG tablet Take 20 mg by mouth at bedtime.   Yes [provider]  Black Cohosh 540 MG CAPS Take 540 mg by mouth daily.    Yes [provider]  BREZTRI  AEROSPHERE 160-9-4.8 MCG/ACT AERO inhaler Inhale 2 puffs into the lungs 2 (two) times daily as needed (for flares). 05/15/23  Yes [provider]  Calcium  Alginate-Silver (DYNAGINATE AG SILVER CAL 4X5 EX) Apply topically See admin instructions. Cut to size and apply to wound on right gluteal cheek with dressing change once a day   Yes [provider]  denosumab (PROLIA) 60 MG/ML SOSY injection Inject 60 mg into the skin every 6 (six) months.   Yes [provider]  famotidine  (PEPCID ) 40 MG tablet Take 0.5 tablets (20 mg total) by mouth daily as needed for heartburn or indigestion. Patient taking differently: Take 20 mg by mouth at bedtime. 10/22/23  Yes Rashid, Farhan, MD  fosfomycin (MONUROL ) 3 g PACK MIX AND STIR 1 PACKET INTO 3-4 OZ OF WATER AND DRINK EVERY 10 DAYS AS DIRECTED. TAKE IMMEDIATELY AFTER DISSOLVING Patient taking differently: Take 3 g by mouth See admin instructions. MIX AND STIR 3 GRAMS (1 PACKET) INTO 3-4 OZ OF WATER AND DRINK BY MOUTH EVERY 10 DAYS AS DIRECTED. DRINK. IMMEDIATELY AFTER DISSOLVING. 12/27/23  Yes Stoneking,  Adine PARAS., MD  Glucosamine-Chondroit-Vit C-Mn (GLUCOSAMINE-CHONDROITIN MAX ST) CAPS Take 1 capsule by mouth daily.    Yes [provider]  HYDROcodone -acetaminophen  (NORCO/VICODIN) 5-325 MG tablet Take 1-2 tablets by mouth every 6 (six) hours as needed for moderate pain (pain score 4-6) or severe pain (pain score 7-10). 11/03/23  Yes Bergman, Meghan D, NP  KERENDIA  10 MG TABS Take 10 mg by mouth in the morning.   Yes [provider]  loratadine  (CLARITIN ) 10 MG tablet Take 10 mg by mouth daily as needed for allergies or rhinitis.   Yes [provider]  multivitamin (RENA-VIT) TABS tablet Take 1 tablet by mouth daily. 05/01/23  Yes [provider]  NIFEdipine  (PROCARDIA  XL/ADALAT -CC) 90 MG 24 hr tablet Take 90 mg by mouth See admin instructions. Take 90 mg by mouth in the morning and HOLD FOR A LOW B/P   Yes [provider]  pregabalin  (LYRICA ) 50 MG capsule Take 100 mg by mouth at bedtime.   Yes [provider]  Probiotic Product (PROBIOTIC ADVANCED PO) Take 1 capsule by mouth daily.    Yes [provider]  solifenacin  (VESICARE ) 10 MG tablet Take 1 tablet (10 mg total) by mouth every evening. 05/10/23  Yes Stoneking, Adine PARAS., MD  tamoxifen  (NOLVADEX ) 20 MG tablet TAKE 1 TABLET EVERY DAY 08/18/23  Yes Odean Potts, MD  TYLENOL  8 HOUR ARTHRITIS PAIN 650 MG CR tablet Take 1,300 mg by mouth daily as needed for pain.   Yes [provider]  cyclobenzaprine  (FLEXERIL ) 5 MG tablet Take 1 tablet (5 mg total) by mouth 3 (three) times daily as needed for muscle spasms. Patient not taking: Reported on 01/05/2024 11/03/23   Bergman, Meghan D, NP  docusate sodium  (COLACE) 100 MG capsule Take 1 capsule (100 mg total) by mouth 2 (two) times daily. Patient not taking: Reported on 01/05/2024 11/03/23   Bergman, Meghan D, NP  lidocaine  (LIDODERM )  5 % APPLY 2 PATCHES TO SKIN EVERY DAY, REMOVE AND REPLACE PATCH AFTER 12 HRS Patient not taking:  Reported on 01/05/2024 08/13/20   Raulkar, Krutika P, MD  megestrol (MEGACE ES) 625 MG/5ML suspension Take 5 mLs (625 mg total) by mouth daily. Patient not taking: Reported on 01/05/2024 12/11/23   Gudena, Vinay, MD  mupirocin  ointment (BACTROBAN ) 2 % Place 1 Application into the nose 2 (two) times daily. Patient not taking: Reported on 01/05/2024 11/03/23   Bergman, Meghan D, NP  ondansetron  (ZOFRAN ) 4 MG tablet Take 1 tablet (4 mg total) by mouth every 6 (six) hours as needed for nausea or vomiting. Patient not taking: Reported on 01/05/2024 11/03/23   Jennetta Sayres D, NP     Due to a high probability of clinically significant, life threatening deterioration, the patient required my highest level of preparedness to intervene emergently and I personally spent this critical care time directly and personally managing the patient. This critical care time included obtaining a history; examining the patient; pulse oximetry; ordering and review of studies; arranging urgent treatment with development of a management plan; evaluation of patient's response to treatment; frequent reassessment; and, discussions with other providers.  This critical care time was performed to assess and manage the high probability of imminent, life-threatening deterioration that could result in multi-organ failure. It was exclusive of separately billable procedures and treating other patients and teaching time. Critical Care Time: 35 minutes.  Paula Southerly, MD  Pulmonary and Critical Care

## 2024-01-06 NOTE — ED Provider Notes (Addendum)
 [facility]-ICU/STEPDOWN Provider Note   CSN: [id number] Arrival date & time: 01/05/24  1230     Patient presents with: Weakness   Heidi Lutz is a 83 y.o. female history of MDS, osteoarthritis, breast cancer in remission, hypertension, anemia presents to the emerged from today for evaluation of low blood pressures for the past 3 days.  Reports that she had her Prolia infusion on Monday.  On Tuesday after physical therapy she started to feel lightheaded and noticed that her blood pressures were low.  Range anywhere from 60-80 systolic.  She still continue to take her blood pressure medication.  She reports that she has been eating and drinking however daughter denies this.  Patient has been on recent medication to help with appetite but did not see that it was helping so stopped taking it.  Reports that she was having some double vision yesterday however that is resolved today.  Mild frontal headache occasionally but nothing now.  Denies any chest pain or shortness of breath.  Denies any black or bloody stools.  Has had 3 blood transfusion since July with the last one being October 1.  Patient has Ritcrit infusion 2 weeks ago per patient.  She denies trouble walking or trouble talking.  Feels more lightheaded with position changes.  Denies any room spinning sensation.  Denies any nausea, vomiting, belly pain.  Denies any fever, chills, recent cough or cold symptoms.  Reports that she is not having some muscle cramps as well.   Weakness Associated symptoms: no fever        Prior to Admission medications   Medication Sig Start Date End Date Taking? Authorizing Provider  albuterol  (VENTOLIN  HFA) 108 (90 Base) MCG/ACT inhaler Inhale 2 puffs into the lungs every 6 (six) hours as needed for wheezing or shortness of breath.   Yes [provider]  atorvastatin  (LIPITOR) 20 MG tablet Take 20 mg by mouth at bedtime.   Yes [provider]  Black Cohosh 540  MG CAPS Take 540 mg by mouth daily.    Yes [provider]  BREZTRI  AEROSPHERE 160-9-4.8 MCG/ACT AERO inhaler Inhale 2 puffs into the lungs 2 (two) times daily as needed (for flares). 05/15/23  Yes [provider]  Calcium  Alginate-Silver (DYNAGINATE AG SILVER CAL 4X5 EX) Apply topically See admin instructions. Cut to size and apply to wound on right gluteal cheek with dressing change once a day   Yes [provider]  denosumab (PROLIA) 60 MG/ML SOSY injection Inject 60 mg into the skin every 6 (six) months.   Yes [provider]  famotidine  (PEPCID ) 40 MG tablet Take 0.5 tablets (20 mg total) by mouth daily as needed for heartburn or indigestion. Patient taking differently: Take 20 mg by mouth at bedtime. 10/22/23  Yes Rashid, Farhan, MD  fosfomycin (MONUROL ) 3 g PACK MIX AND STIR 1 PACKET INTO 3-4 OZ OF WATER AND DRINK EVERY 10 DAYS AS DIRECTED. TAKE IMMEDIATELY AFTER DISSOLVING Patient taking differently: Take 3 g by mouth See admin instructions. MIX AND STIR 3 GRAMS (1 PACKET) INTO 3-4 OZ OF WATER AND DRINK BY MOUTH EVERY 10 DAYS AS DIRECTED. DRINK. IMMEDIATELY AFTER DISSOLVING. 12/27/23  Yes Stoneking, Adine PARAS., MD  Glucosamine-Chondroit-Vit C-Mn (GLUCOSAMINE-CHONDROITIN MAX ST) CAPS Take 1 capsule by mouth daily.    Yes [provider]  HYDROcodone -acetaminophen  (NORCO/VICODIN) 5-325 MG tablet Take 1-2 tablets by mouth every 6 (six) hours as needed for moderate pain (pain score 4-6) or severe pain (pain  score 7-10). 11/03/23  Yes Bergman, Meghan D, NP  KERENDIA  10 MG TABS Take 10 mg by mouth in the morning.   Yes [provider]  loratadine  (CLARITIN ) 10 MG tablet Take 10 mg by mouth daily as needed for allergies or rhinitis.   Yes [provider]  multivitamin (RENA-VIT) TABS tablet Take 1 tablet by mouth daily. 05/01/23  Yes [provider]  NIFEdipine  (PROCARDIA  XL/ADALAT -CC) 90 MG 24 hr tablet Take 90 mg by mouth See  admin instructions. Take 90 mg by mouth in the morning and HOLD FOR A LOW B/P   Yes [provider]  pregabalin  (LYRICA ) 50 MG capsule Take 100 mg by mouth at bedtime.   Yes [provider]  Probiotic Product (PROBIOTIC ADVANCED PO) Take 1 capsule by mouth daily.    Yes [provider]  solifenacin  (VESICARE ) 10 MG tablet Take 1 tablet (10 mg total) by mouth every evening. 05/10/23  Yes Stoneking, Adine PARAS., MD  tamoxifen  (NOLVADEX ) 20 MG tablet TAKE 1 TABLET EVERY DAY 08/18/23  Yes Odean Potts, MD  TYLENOL  8 HOUR ARTHRITIS PAIN 650 MG CR tablet Take 1,300 mg by mouth daily as needed for pain.   Yes [provider]  cyclobenzaprine  (FLEXERIL ) 5 MG tablet Take 1 tablet (5 mg total) by mouth 3 (three) times daily as needed for muscle spasms. Patient not taking: Reported on 01/05/2024 11/03/23   Bergman, Meghan D, NP  docusate sodium  (COLACE) 100 MG capsule Take 1 capsule (100 mg total) by mouth 2 (two) times daily. Patient not taking: Reported on 01/05/2024 11/03/23   Bergman, Meghan D, NP  lidocaine  (LIDODERM ) 5 % APPLY 2 PATCHES TO SKIN EVERY DAY, REMOVE AND REPLACE PATCH AFTER 12 HRS Patient not taking: Reported on 01/05/2024 08/13/20   Lorilee Sven SQUIBB, MD  megestrol (MEGACE ES) 625 MG/5ML suspension Take 5 mLs (625 mg total) by mouth daily. Patient not taking: Reported on 01/05/2024 12/11/23   Gudena, Vinay, MD  mupirocin  ointment (BACTROBAN ) 2 % Place 1 Application into the nose 2 (two) times daily. Patient not taking: Reported on 01/05/2024 11/03/23   Bergman, Meghan D, NP  ondansetron  (ZOFRAN ) 4 MG tablet Take 1 tablet (4 mg total) by mouth every 6 (six) hours as needed for nausea or vomiting. Patient not taking: Reported on 01/05/2024 11/03/23   Bergman, Meghan D, NP    Allergies: Fluorouracil, Other, Clavulanic acid, Codeine, Hydrocodone , and Penicillins    Review of Systems  Constitutional:  Negative for chills and fever.  Neurological:  Positive for  weakness.    Updated Vital Signs BP (!) 106/49   Pulse 71   Temp 98.3 F (36.8 C) (Oral)   Resp 15   LMP  (LMP Unknown)   SpO2 96%   Physical Exam Vitals and nursing note reviewed.  Constitutional:      Appearance: She is not toxic-appearing.  HENT:     Mouth/Throat:     Mouth: Mucous membranes are dry.  Eyes:     General: No scleral icterus. Cardiovascular:     Rate and Rhythm: Normal rate.  Pulmonary:     Effort: Pulmonary effort is normal. No respiratory distress.  Abdominal:     Palpations: Abdomen is soft.     Tenderness: There is no abdominal tenderness. There is no guarding or rebound.  Musculoskeletal:     Right lower leg: No edema.     Left lower leg: No edema.     Comments: Compartments are soft throughout.  Skin:    General: Skin is warm and dry.  Neurological:     General: No focal deficit present.     Mental Status: She is alert.     Comments: Generalized weakness but no focal deficit noted.     (all labs ordered are listed, but only abnormal results are displayed) Labs Reviewed  MRSA NEXT GEN BY PCR, NASAL - Abnormal; Notable for the following components:      Result Value   MRSA by PCR Next Gen DETECTED (*)    All other components within normal limits  COMPREHENSIVE METABOLIC PANEL WITH GFR - Abnormal; Notable for the following components:   Sodium 133 (*)    Potassium 6.2 (*)    BUN 66 (*)    Creatinine, Ser 3.06 (*)    Calcium  8.4 (*)    Total Protein 6.4 (*)    GFR, Estimated 14 (*)    All other components within normal limits  CBC WITH DIFFERENTIAL/PLATELET - Abnormal; Notable for the following components:   RBC 1.92 (*)    Hemoglobin 7.0 (*)    HCT 21.9 (*)    MCV 114.1 (*)    MCH 36.5 (*)    RDW 21.4 (*)    Platelets 114 (*)    All other components within normal limits  PROTIME-INR - Abnormal; Notable for the following components:   Prothrombin Time 20.4 (*)    INR 1.7 (*)    All other components within normal limits  BASIC  METABOLIC PANEL WITH GFR - Abnormal; Notable for the following components:   Sodium 132 (*)    Potassium 5.6 (*)    CO2 21 (*)    Glucose, Bld 102 (*)    BUN 64 (*)    Creatinine, Ser 2.88 (*)    Calcium  8.1 (*)    GFR, Estimated 16 (*)    All other components within normal limits  I-STAT CHEM 8, ED - Abnormal; Notable for the following components:   Sodium 132 (*)    Potassium 5.6 (*)    BUN 64 (*)    Creatinine, Ser 3.30 (*)    Calcium , Ion 1.08 (*)    Hemoglobin 5.8 (*)    HCT 17.0 (*)    All other components within normal limits  I-STAT CG4 LACTIC ACID, ED - Abnormal; Notable for the following components:   Lactic Acid, Venous 2.2 (*)    All other components within normal limits  CULTURE, BLOOD (ROUTINE X 2)  CULTURE, BLOOD (ROUTINE X 2)  MAGNESIUM   CK  URINALYSIS, W/ REFLEX TO CULTURE (INFECTION SUSPECTED)  URINALYSIS, ROUTINE W REFLEX MICROSCOPIC  CBC  BASIC METABOLIC PANEL WITH GFR  CBC  CORTISOL  I-STAT CG4 LACTIC ACID, ED  I-STAT CG4 LACTIC ACID, ED  I-STAT CG4 LACTIC ACID, ED  TYPE AND SCREEN  PREPARE RBC (CROSSMATCH)    EKG: EKG Interpretation Date/Time:  Friday January 05 2024 13:10:13 EST Ventricular Rate:  70 PR Interval:  203 QRS Duration:  131 QT Interval:  452 QTC Calculation: 488 R Axis:   19  Text Interpretation: Sinus rhythm Left bundle branch block Confirmed by Towana Sharper [phone]) on 01/05/2024 1:12:15 PM  Radiology: [facility] Chest Port 1 View Result Date: 01/05/2024 EXAM: 1 VIEW(S) XRAY OF THE CHEST 01/05/2024 02:09:00 PM COMPARISON: None available. CLINICAL HISTORY: Questionable sepsis - evaluate for abnormality FINDINGS: LUNGS AND PLEURA: Hazy opacity at left lung base, likely atelectasis. No pleural effusion. No pneumothorax. HEART AND MEDIASTINUM: Aortic atherosclerosis. No acute  abnormality of the cardiac and mediastinal silhouettes. BONES AND SOFT TISSUES: Surgical clips in left breast. Degenerative changes of right shoulder. Left  reverse total shoulder arthroplasty noted. IMPRESSION: 1. Hazy opacity at the left lung base, likely atelectasis. 2. Aortic Atherosclerosis (ICD10-I70.0). Electronically signed by: Ryan Chess MD 01/05/2024 02:33 PM EST RP Workstation: [id number]   .Critical Care  Performed by: Bernis Ernst, PA-C Authorized by: Bernis Ernst, PA-C   Critical care provider statement:    Critical care time (minutes):  60   Critical care was necessary to treat or prevent imminent or life-threatening deterioration of the following conditions:  Circulatory failure and shock   Critical care was time spent personally by me on the following activities:  Development of treatment plan with patient or surrogate, discussions with consultants, evaluation of patient's response to treatment, examination of patient, ordering and review of laboratory studies, ordering and review of radiographic studies, ordering and performing treatments and interventions, pulse oximetry, re-evaluation of patient's condition, review of old charts and obtaining history from patient or surrogate   Care discussed with: admitting provider      Medications Ordered in the ED  sodium zirconium cyclosilicate (LOKELMA) packet 10 g (10 g Oral Given 01/05/24 2257)  norepinephrine (LEVOPHED) 4mg  in (0.016 mg/mL) premix infusion (3 mcg/min Intravenous Infusion Verify 01/05/24 2358)  Chlorhexidine  Gluconate Cloth 2 % PADS 6 each (6 each Topical Given 01/05/24 2030)  docusate sodium  (COLACE) capsule 100 mg (has no administration in time range)  polyethylene glycol (MIRALAX / GLYCOLAX) packet 17 g (has no administration in time range)  ondansetron  (ZOFRAN ) injection 4 mg (has no administration in time range)  budesonide -glycopyrrolate -formoterol  (BREZTRI ) 160-9-4.8 MCG/ACT inhaler 2 puff (2 puffs Inhalation Not Given 01/05/24 2201)  atorvastatin  (LIPITOR) tablet 20 mg (20 mg Oral Given 01/05/24 2258)  azithromycin (ZITHROMAX) 500 mg in sodium  chloride 0.9 % 250 mL IVPB ( Intravenous Infusion Verify 01/05/24 2358)  cefTRIAXone (ROCEPHIN) 1 g in sodium chloride  0.9 % 100 mL IVPB (has no administration in time range)  sodium chloride  (OCEAN) 0.65 % nasal spray 1 spray (1 spray Each Nare Given 01/05/24 2302)  mupirocin  ointment (BACTROBAN ) 2 % (has no administration in time range)  lactated ringers  bolus 1,000 mL (0 mLs Intravenous Stopped 01/05/24 1540)  0.9 %  sodium chloride  infusion (0 mLs Intravenous Stopped 01/05/24 2130)  0.9 %  sodium chloride  infusion (Manually program via Guardrails IV Fluids) (0 mLs Intravenous Stopped 01/05/24 1831)  sodium chloride  0.9 % bolus 500 mL (0 mLs Intravenous Stopped 01/05/24 1647)  sodium chloride  0.9 % bolus 500 mL (0 mLs Intravenous Stopped 01/05/24 1831)  lactated ringers  bolus 1,000 mL (1,000 mLs Intravenous New Bag/Given 01/05/24 1950)    Clinical Course as of 01/06/24 0000  Fri Jan 05, 2024  1450 Discussed with patient and family results, next steps, and need for admission. Waiting on recommendations from nephrology and oncology  [RR]  [phone] Spoke with Dr. Odean with oncology. Agrees with plan for admission, transfusion, and fluids.  [RR]  1511 Spoke with Dr. Geralynn with nephrology. Recommends renal diet, Lokelma 10g TID until potassium is <5.0, and continue with IVF. [RR]  [id number] 83 year old female here with general weakness fatigue in the setting of cancer and anemia.  Hemoglobin low again along with blood pressure, worsening renal function and potassium level.  Will need transfusion and fluids, treatment for low potassium, admission. [MB]  1841 Spoke with Critical Care who will come down and evaluate the patient  [RR]  Clinical Course User Index [MB] Towana Ozell BROCKS, MD [RR] Bernis Ernst, PA-C   Medical Decision Making Amount and/or Complexity of Data Reviewed Labs: ordered. Radiology: ordered.  Risk Prescription drug management. Decision regarding hospitalization.   84  y.o. female presents to the ER for evaluation of low blood pressure. Differential diagnosis includes but is not limited to hypovolemia, medication reaction, sepsis. Vital signs blood pressure systolic in the 60, otherwise unremarkable.SABRA Physical exam as noted above.   Given hypotension and patient's immune compromise, I did start ED sepsis initial workup.  Will order chest x-ray for fatigue.  I have started the patient on 1 L IV fluids given hypotension and patient does appear hypovolemic.  Last echo was in 2021 that showed an EF of 65%.  I independently reviewed and interpreted the patient's labs.  CBC shows blood count of 7.0 with a medic of 21.9.  It appears patient's baseline is in the upper eights.  No leukocytosis.  CMP shows sodium 133, potassium 6.2, BUN of 66 with a creatinine of 3.06.  Appears her baseline is around 1.5.  Mild decreased calcium  and total protein.  No other electrolyte or LFT abnormality.  CK within normal limits.  Magnesium  within the limits.  Initial lactic acid at 1.7 repeat however was elevated at 2.2.  PT/INR shows PT of 20.4 with an INR of 1.7.  Urinalysis still need to be collected and blood cultures are pending.  CXR shows 1. Hazy opacity at the left lung base, likely atelectasis. 2. Aortic Atherosclerosis . Per radiologist's interpretation.    Discussed risks and benefits of blood transfusion with patient and family at bedside.  They agreed to blood transfusion.  Will order 2 units.  Patient's blood pressure still shows maps less than 65.  I have ordered additional 500 cc bolus.  Patient blood pressure appears to be more responsive when receiving fluids greater than 100 cc/h.  Will order additional 500 cc fluid.  No chest pain or shortness of breath.  Patient's blood pressure still showing maps less than 65.  Will consult critical care at this time given that she has received 2 L of fluid without improvement of maps.  She currently is receiving the first unit of blood.   I have started the patient on Levophed for vasopressor support in addition to the fluids.  She is not having any chest pain or shortness of breath.  She only urinated after receiving 1.5 L of fluid.  Patient's bladder scan with PVR did not show any residual urine.  I feel she is still dehydrated however will allow all critical care consultation.  Consulted critical care who assessed the patient at bedside.  Ultrasound shows compressible IVC, was asked to order additional liter of fluid and they will accept the patient onto the ICU floor. Still would like to continue with pressors.  Portions of this report may have been transcribed using voice recognition software. Every effort was made to ensure accuracy; however, inadvertent computerized transcription errors may be present.    Final diagnoses:  Hypotension due to hypovolemia  Anemia, unspecified type    ED Discharge Orders     None          Bernis Ernst, PA-C 01/06/24 0023    Bernis Ernst, PA-C 01/06/24 0023    Towana Ozell BROCKS, MD 01/06/24 [phone]

## 2024-01-07 DIAGNOSIS — N179 Acute kidney failure, unspecified: Secondary | ICD-10-CM | POA: Diagnosis not present

## 2024-01-07 DIAGNOSIS — R579 Shock, unspecified: Secondary | ICD-10-CM | POA: Diagnosis not present

## 2024-01-07 DIAGNOSIS — J189 Pneumonia, unspecified organism: Secondary | ICD-10-CM | POA: Diagnosis not present

## 2024-01-07 MED ORDER — MIDODRINE HCL 5 MG PO TABS
10.0000 mg | ORAL_TABLET | Freq: Three times a day (TID) | ORAL | Status: DC
  Administered 2024-01-07 (×2): 10 mg via ORAL
  Filled 2024-01-07 (×2): qty 2

## 2024-01-07 NOTE — Plan of Care (Signed)

## 2024-01-07 NOTE — Progress Notes (Signed)
 NAME:  Heidi Lutz, MRN:  [id number], DOB:  [birth year], LOS: 2 ADMISSION DATE:  01/05/2024, CONSULTATION DATE:  @TODAY @ REFERRING MD:  ED, CHIEF COMPLAINT:  shock state   History of Present Illness:  Ms. Kepple is an 83 y/o F with a PMH significant for MDS, remote hx of breast cancer, and  protein calorie malnutrition who presents for lethargy found to have symptomatic anemia and hypovolemic shock requiring ICU level care for hemodynamic monitoring.   HPI: She has been experiencing fatigue and low blood pressure since Tuesday. Despite having a good sleep on Tuesday, she felt fatigued and sleepy on Wednesday, with a reduced appetite. By Thursday, her low blood pressure prevented her from participating in physical therapy. She experienced dizziness and double vision on Thursday, although the double vision resolved by Friday. No fevers, chills, night sweats, shortness of breath, cough, or urinary symptoms. She experienced nausea on Tuesday afternoon, attributing it to overexertion during physical therapy and insufficient food intake before taking her medications.   She has a history of myelodysplastic syndrome, diagnosed in October 2025. She received a Retacrit injection two weeks ago, with a hemoglobin count of 8.4 at that time. She has had three blood transfusions since July 2025. She denies having a port and reports no recent blood clots despite a recent episode of leg swelling. She reports that she underwent extensive scans and a bone marrow biopsy as part of her workup for MDS.   In July 2025, she fell and fractured her neck in two places, requiring surgery and subsequent rehabilitation. She wears a C collar when in a car for neck support, as recommended after her neck surgery. Her calcium  levels were high after a recent blood transfusion, for which she received a bisphosphonate infusion to lower it. She denies any history of high blood pressure, although her blood pressure has been low since  her surgery. She is not on any blood thinners and has not noticed any blood in her stool.   She has an allergy to all 'penicillins', which causes a rash. She recently experienced a rash after a shot, which was managed with Benadryl. She is not currently on calcium  supplements.  Pertinent  Medical History  MDS Chronic anemia Breast Cancer Osteoporosis Hypercalcemia  Significant Hospital Events: Including procedures, antibiotic start and stop dates in addition to other pertinent events   01/05/24 --> admitted for shock state despite IVF resuscitation 01/06/24 --> remains on vasopressors 01/07/24 --> weaned off levophed in AM  Interim History / Subjective:  She reports feeling well, anxious to go home.  Objective    Blood pressure (!) 126/58, pulse 75, temperature 98.2 F (36.8 C), temperature source Axillary, resp. rate 13, height 5' 2.5 (1.588 m), weight 55.2 kg, SpO2 93%.        Intake/Output Summary (Last 24 hours) at 01/07/2024 1024 Last data filed at 01/07/2024 0900 Gross per 24 hour  Intake 3273.84 ml  Output 700 ml  Net 2573.84 ml   Filed Weights   01/06/24 0000 01/06/24 0500 01/07/24 0452  Weight: 50.7 kg 50.7 kg 55.2 kg    Examination: General: thin woman, no distress HENT: PERRL, anicteric sclera, well injected conjunctivae, oral and nasal mucosa normal Lungs: crackles in left lung, clear right lung Cardiovascular: RRR, no murmurs Abdomen: soft non-tender Extremities: trace edema Neuro: alert, oriented x 3, no focal deficits GU: Deferred  Resolved problem list   Assessment and Plan   Cardiovascular:  #Shock: likely hypovolemic and septic shock due to  suspected pneumonia in an immunocompromised host. IMPROVING - Wean levophed as tolerated - Patient already adequately IVF resuscitated - Midodrine 10 mg TID, wean off while in hospital - Target SBP > 100  #CAP: MRSA swab + - Linezolid (end date: 01/10/2024) - Ceftriaxone (end date: 01/09/2024) -  Azithromycin (end date: 01/07/2024)  #Stress Ulcer PPx: PPI  #Nutrition: regular diet  #AKI: likely pre-renal and improved with IVF resuscitation; IMPROVING. - Renally adjust medications - Avoid Nephrotoxins - Strict I/Os - External urinary device  #VTE ppx: heparin  subQ TID  #MDS: #Chronic Anemia: - Transfuse as needed for Hgb < 7 mg/dL  Disposition: If patient remains off pressors by 4 PM, will consider moving to med surg with tele.  Labs   CBC: Recent Labs  Lab 01/05/24 1332 01/05/24 1439 01/05/24 2241 01/06/24 0727  WBC 5.3  --  4.9 6.0  NEUTROABS 2.6  --   --   --   HGB 7.0* 5.8* 8.9* 10.5*  HCT 21.9* 17.0* 26.8* 32.1*  MCV 114.1*  --  104.7* 102.2*  PLT 114*  --  106* 103*    Basic Metabolic Panel: Recent Labs  Lab 01/05/24 1332 01/05/24 1439 01/05/24 1506 01/06/24 0727  NA 133* 132* 132* 139  K 6.2* 5.6* 5.6* 4.6  CL 101 102 102 108  CO2 22  --  21* 21*  GLUCOSE 98 99 102* 108*  BUN 66* 64* 64* 45*  CREATININE 3.06* 3.30* 2.88* 2.08*  CALCIUM  8.4*  --  8.1* 8.0*  MG  --   --  2.2  --    GFR: Estimated Creatinine Clearance: 16.3 mL/min (A) (by C-G formula based on SCr of 2.08 mg/dL (H)). Recent Labs  Lab 01/05/24 1332 01/05/24 1346 01/05/24 1437 01/05/24 1748 01/05/24 2241 01/06/24 0727  WBC 5.3  --   --   --  4.9 6.0  LATICACIDVEN  --  1.7 2.2* 0.7  --   --     Liver Function Tests: Recent Labs  Lab 01/05/24 1332  AST 21  ALT <5  ALKPHOS 40  BILITOT 0.5  PROT 6.4*  ALBUMIN  3.5   No results for input(s): LIPASE, AMYLASE in the last 168 hours. No results for input(s): AMMONIA in the last 168 hours.  ABG    Component Value Date/Time   HCO3 25.9 10/18/2023 2059   TCO2 22 01/05/2024 1439   O2SAT 56 10/18/2023 2059     Coagulation Profile: Recent Labs  Lab 01/05/24 1332  INR 1.7*    Cardiac Enzymes: Recent Labs  Lab 01/05/24 1506  CKTOTAL 75    HbA1C: Hgb A1c MFr Bld  Date/Time Value Ref Range Status   12/04/2023 08:37 AM 5.2 4.8 - 5.6 % Final    Comment:    (NOTE) Diagnosis of Diabetes The following HbA1c ranges recommended by the American Diabetes Association (ADA) may be used as an aid in the diagnosis of diabetes mellitus.  Hemoglobin             Suggested A1C NGSP%              Diagnosis  <5.7                   Non Diabetic  5.7-6.4                Pre-Diabetic  >6.4                   Diabetic  <7.0  Glycemic control for                       adults with diabetes.      CBG: Recent Labs  Lab 01/06/24 0745  GLUCAP 110*    Review of Systems:   Not obtained  Past Medical History:  She,  has a past medical history of Age-related osteoporosis without current pathological fracture (10/31/2019), Allergic rhinitis (07/31/2007), Anemia, Aortic atherosclerosis (08/16/2022), Basal cell carcinoma (12/15/2014), Cancer (HCC) (01/2017), Cervical spondylosis (07/13/2022), Closed compression fracture of L1 lumbar vertebra, with routine healing, subsequent encounter (11/26/2013), Complication of anesthesia, Environmental allergies, Essential hypertension (09/19/2008), GAD (generalized anxiety disorder) (04/25/2017), High cholesterol, History of UTI (05/25/2022), History of vertebral compression fracture (07/16/2019), Hypercalcemia (11/22/2023), Hyperlipidemia (04/22/2009), IBS (irritable bowel syndrome) (08/18/2006), Insomnia (02/02/2010), LBBB (left bundle branch block) (03/22/2017), Lumbar degenerative disc disease (02/27/2015), Lumbar spinal stenosis (02/27/2015), Major depression (04/25/2017), Malignant neoplasm of central portion of left breast in female, estrogen receptor positive (HCC) (03/16/2017), Mixed dyslipidemia (03/22/2017), Mixed hyperlipidemia (03/22/2017), OAB (overactive bladder) (02/2020), Osteoarthritis, Osteoarthritis of spine with radiculopathy, lumbar region (02/27/2015), Osteopenia (03/03/2017), Personal history of radiation therapy, Pleuritic chest  pain (04/10/2018), PONV (postoperative nausea and vomiting), Pre-operative cardiovascular examination (03/22/2017), Raynaud disease, Raynaud's disease (08/18/2006), Rheumatoid arthritis (HCC), 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   Surgical History:   Past Surgical History:  Procedure Laterality Date   ABDOMINAL HYSTERECTOMY  1972   APPENDECTOMY  1953   BREAST BIOPSY Left 05/19/2020   benign   BREAST LUMPECTOMY Left 2019   BREAST LUMPECTOMY WITH RADIOACTIVE SEED AND SENTINEL LYMPH NODE BIOPSY Left 04/07/2017   Procedure: BREAST LUMPECTOMY WITH RADIOACTIVE SEED AND SENTINEL LYMPH NODE BIOPSY;  Surgeon: Vanderbilt Ned, MD;  Location: [facility] OR;  Service: General;  Laterality: Left;   BUNIONECTOMY  1999   COLONOSCOPY     EYE SURGERY     bil cataract   GANGLION CYST EXCISION  1968   JOINT REPLACEMENT Left 2017    reverse shoulder replacement   POSTERIOR CERVICAL FUSION/FORAMINOTOMY N/A 11/02/2023   Procedure: POSTERIOR CERVICAL FUSION/FORAMINOTOMY CERVICAL ONE-TWO;  Surgeon: Mavis Purchase, MD;  [facility];  Service: Neurosurgery;  Laterality: N/A;   REPLACEMENT TOTAL KNEE  2012, 2013   Right and Left   SHOULDER ARTHROSCOPY Left    SYMPATHECTOMY  1970   TONSILLECTOMY  1949     Social History:   reports that she quit smoking about 60 years ago. Her smoking use included cigarettes. She has been exposed to tobacco smoke. She has never used smokeless tobacco. She reports current alcohol use of about 14.0 standard drinks of alcohol per week. She reports that she does not use drugs.   Family History:  Her family history is negative for Breast cancer. She was adopted.   Allergies Allergies  Allergen Reactions   Fluorouracil Hives   Other Hives, Itching, Swelling, Rash and Other (See Comments)    NO -CILLIN(s)    Clavulanic Acid Other (See Comments)    Clavulanic acid is a medication that can be used in conjunction with amoxicillin to manage and treat bacterial infections, specifically bacteria that are beta-lactamase producers. It is in the beta-lactamase inhibitor class of medications. Reaction undefined, but is allergic if it is paired with any -cillin(s)   Codeine Nausea And Vomiting   Hydrocodone  Nausea And Vomiting   Penicillins Hives, Swelling, Rash  and Other (See Comments)    Pt states allergic to all cillin drugs and Joint pain     Home Medications  Prior to Admission medications   Medication Sig Start Date End Date Taking? Authorizing Provider  albuterol  (VENTOLIN  HFA) 108 (90 Base) MCG/ACT inhaler Inhale 2 puffs into the lungs every 6 (six) hours as needed for wheezing or shortness of breath.   Yes [provider]  atorvastatin  (LIPITOR) 20 MG tablet Take 20 mg by mouth at bedtime.   Yes [provider]  Black Cohosh 540 MG CAPS Take 540 mg by mouth daily.    Yes [provider]  BREZTRI  AEROSPHERE 160-9-4.8 MCG/ACT AERO inhaler Inhale 2 puffs into the lungs 2 (two) times daily as needed (for flares). 05/15/23  Yes [provider]  Calcium  Alginate-Silver (DYNAGINATE AG SILVER CAL 4X5 EX) Apply topically See admin instructions. Cut to size and apply to wound on right gluteal cheek with dressing change once a day   Yes [provider]  denosumab (PROLIA) 60 MG/ML SOSY injection Inject 60 mg into the skin every 6 (six) months.   Yes [provider]  famotidine  (PEPCID ) 40 MG tablet Take 0.5 tablets (20 mg total) by mouth daily as needed for heartburn or indigestion. Patient taking differently: Take 20 mg by mouth at bedtime. 10/22/23  Yes Rashid, Farhan, MD  fosfomycin (MONUROL ) 3 g PACK MIX AND STIR 1 PACKET INTO 3-4 OZ OF WATER AND DRINK EVERY 10 DAYS AS DIRECTED. TAKE IMMEDIATELY AFTER DISSOLVING Patient taking differently: Take 3  g by mouth See admin instructions. MIX AND STIR 3 GRAMS (1 PACKET) INTO 3-4 OZ OF WATER AND DRINK BY MOUTH EVERY 10 DAYS AS DIRECTED. DRINK. IMMEDIATELY AFTER DISSOLVING. 12/27/23  Yes Stoneking, Adine PARAS., MD  Glucosamine-Chondroit-Vit C-Mn (GLUCOSAMINE-CHONDROITIN MAX ST) CAPS Take 1 capsule by mouth daily.    Yes [provider]  HYDROcodone -acetaminophen  (NORCO/VICODIN) 5-325 MG tablet Take 1-2 tablets by mouth every 6 (six) hours as needed for moderate pain (pain score 4-6) or severe pain (pain score 7-10). 11/03/23  Yes Bergman, Meghan D, NP  KERENDIA  10 MG TABS Take 10 mg by mouth in the morning.   Yes [provider]  loratadine  (CLARITIN ) 10 MG tablet Take 10 mg by mouth daily as needed for allergies or rhinitis.   Yes [provider]  multivitamin (RENA-VIT) TABS tablet Take 1 tablet by mouth daily. 05/01/23  Yes [provider]  NIFEdipine  (PROCARDIA  XL/ADALAT -CC) 90 MG 24 hr tablet Take 90 mg by mouth See admin instructions. Take 90 mg by mouth in the morning and HOLD FOR A LOW B/P   Yes [provider]  pregabalin  (LYRICA ) 50 MG capsule Take 100 mg by mouth at bedtime.   Yes [provider]  Probiotic Product (PROBIOTIC ADVANCED PO) Take 1 capsule by mouth daily.    Yes [provider]  solifenacin  (VESICARE ) 10 MG tablet Take 1 tablet (10 mg total) by mouth every evening. 05/10/23  Yes Stoneking, Adine PARAS., MD  tamoxifen  (NOLVADEX ) 20 MG tablet TAKE 1 TABLET EVERY DAY 08/18/23  Yes Odean Potts, MD  TYLENOL  8 HOUR ARTHRITIS PAIN 650 MG CR tablet Take 1,300 mg by mouth daily as needed for pain.   Yes [provider]  cyclobenzaprine  (FLEXERIL ) 5 MG tablet Take 1 tablet (5 mg total) by mouth 3 (three) times daily as needed for muscle spasms. Patient not taking: Reported on 01/05/2024 11/03/23   Bergman, Meghan D, NP  docusate  sodium (COLACE) 100 MG capsule Take 1 capsule (100 mg total) by mouth 2 (two) times  daily. Patient not taking: Reported on 01/05/2024 11/03/23   Bergman, Meghan D, NP  lidocaine  (LIDODERM ) 5 % APPLY 2 PATCHES TO SKIN EVERY DAY, REMOVE AND REPLACE PATCH AFTER 12 HRS Patient not taking: Reported on 01/05/2024 08/13/20   Lorilee Sven SQUIBB, MD  megestrol (MEGACE ES) 625 MG/5ML suspension Take 5 mLs (625 mg total) by mouth daily. Patient not taking: Reported on 01/05/2024 12/11/23   Gudena, Vinay, MD  mupirocin  ointment (BACTROBAN ) 2 % Place 1 Application into the nose 2 (two) times daily. Patient not taking: Reported on 01/05/2024 11/03/23   Bergman, Meghan D, NP  ondansetron  (ZOFRAN ) 4 MG tablet Take 1 tablet (4 mg total) by mouth every 6 (six) hours as needed for nausea or vomiting. Patient not taking: Reported on 01/05/2024 11/03/23   Jennetta Sayres D, NP     I have spent 35 minutes evaluating patient, reviewing chart, and discussing plan of care with patient, family, pharmacist on round and primary medical team.  Paula Southerly, MD [facility]

## 2024-01-08 ENCOUNTER — Encounter (HOSPITAL_COMMUNITY): Payer: Self-pay | Admitting: Pulmonary Disease

## 2024-01-08 ENCOUNTER — Telehealth: Payer: Self-pay

## 2024-01-08 ENCOUNTER — Encounter: Payer: Self-pay | Admitting: Hematology and Oncology

## 2024-01-08 DIAGNOSIS — R579 Shock, unspecified: Secondary | ICD-10-CM | POA: Diagnosis not present

## 2024-01-08 LAB — CBC
HCT: 30 % — ABNORMAL LOW (ref 36.0–46.0)
Hemoglobin: 9.7 g/dL — ABNORMAL LOW (ref 12.0–15.0)
MCH: 34.3 pg — ABNORMAL HIGH (ref 26.0–34.0)
MCHC: 32.3 g/dL (ref 30.0–36.0)
MCV: 106 fL — ABNORMAL HIGH (ref 80.0–100.0)
Platelets: 81 K/uL — ABNORMAL LOW (ref 150–400)
RBC: 2.83 MIL/uL — ABNORMAL LOW (ref 3.87–5.11)
RDW: 22.5 % — ABNORMAL HIGH (ref 11.5–15.5)
WBC: 4.3 K/uL (ref 4.0–10.5)
nRBC: 0 % (ref 0.0–0.2)

## 2024-01-08 LAB — TYPE AND SCREEN
ABO/RH(D): O POS
Antibody Screen: NEGATIVE
Unit division: 0
Unit division: 0

## 2024-01-08 LAB — COMPREHENSIVE METABOLIC PANEL WITH GFR
ALT: <5 U/L (ref 0–44)
AST: 16 U/L (ref 15–41)
Albumin: 2.9 g/dL — ABNORMAL LOW (ref 3.5–5.0)
Alkaline Phosphatase: 38 U/L (ref 38–126)
Anion gap: 8 (ref 5–15)
BUN: 23 mg/dL (ref 8–23)
CO2: 21 mmol/L — ABNORMAL LOW (ref 22–32)
Calcium: 7.7 mg/dL — ABNORMAL LOW (ref 8.9–10.3)
Chloride: 111 mmol/L (ref 98–111)
Creatinine, Ser: 1.39 mg/dL — ABNORMAL HIGH (ref 0.44–1.00)
GFR, Estimated: 37 mL/min — ABNORMAL LOW (ref >60)
Glucose, Bld: 88 mg/dL (ref 70–99)
Potassium: 4.4 mmol/L (ref 3.5–5.1)
Sodium: 141 mmol/L (ref 135–145)
Total Bilirubin: 0.3 mg/dL (ref 0.0–1.2)
Total Protein: 5.3 g/dL — ABNORMAL LOW (ref 6.5–8.1)

## 2024-01-08 LAB — BPAM RBC
Blood Product Expiration Date: 202512162359
Blood Product Expiration Date: 202512162359
ISSUE DATE / TIME: 202511141631
ISSUE DATE / TIME: 202511150027
Unit Type and Rh: 5100
Unit Type and Rh: 5100

## 2024-01-08 MED ORDER — FAMOTIDINE 20 MG PO TABS
20.0000 mg | ORAL_TABLET | Freq: Every day | ORAL | Status: DC
  Administered 2024-01-08: 20 mg via ORAL
  Filled 2024-01-08: qty 1

## 2024-01-08 MED ORDER — ZINC OXIDE 40 % EX OINT
TOPICAL_OINTMENT | Freq: Three times a day (TID) | CUTANEOUS | Status: DC
  Filled 2024-01-08: qty 57

## 2024-01-08 MED ORDER — PROCHLORPERAZINE EDISYLATE 10 MG/2ML IJ SOLN: 10.0000 mg | INTRAMUSCULAR | Status: DC | PRN

## 2024-01-08 MED ORDER — DIPHENHYDRAMINE-ZINC ACETATE 2-0.1 % EX CREA
TOPICAL_CREAM | Freq: Three times a day (TID) | CUTANEOUS | Status: DC | PRN
  Administered 2024-01-08: 1 via TOPICAL
  Filled 2024-01-08: qty 28

## 2024-01-08 NOTE — Hospital Course (Addendum)
 83 y.o. 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

## 2024-01-08 NOTE — Progress Notes (Signed)
 eLink Physician-Brief Progress Note Patient Name: Aleeya Veitch DOB: 07-18-1940 MRN: [id number]   Date of Service  01/08/2024  HPI/Events of Note  Transferred out of ICU, nausea unrelieved by Zofran   eICU Interventions  Add Compazine     Intervention Category Minor Interventions: Routine modifications to care plan (e.g. PRN medications for pain, fever)  Kasyn Stouffer 01/08/2024, 12:02 AM

## 2024-01-08 NOTE — Telephone Encounter (Signed)
 Patient's husband Elsie called about pt's appts. She is currently inpatient, planned for d/c home tomorrow and per Dr Jonel, she will receive retacrit before d/c home. Dr Odean would like pt to f/u in office with labs MD visit and injection in 3 weeks. Message sent to scheduling. Pt's family has questions about her cytogenetic results. Per MD, prognosis is good in that she is at low risk for MDS turning to Leukemia.

## 2024-01-08 NOTE — TOC Initial Note (Addendum)
 Transition of Care [facility]) - Initial/Assessment Note    Patient Details  Name: Heidi Lutz MRN: [id number] Date of Birth: 13-Sep-1940  Transition of Care [facility]) CM/SW Contact:    Jon ONEIDA Anon, RN Phone Number: 01/08/2024, 1:42 PM  Clinical Narrative:                 [place] [facility] [place]. RNCM met with pt, pt spouse and daughter at bedside. Introduced role in DC planning. PT evaluated and recommends HH PT at DC. Pt states she has been receiving HH PT on T, TH provided through PT at [facility], and would prefer to continue with there program. Will need HHPT order printed and sent with pt to give to Clinic at [facility] at DC. Pt states she has all needed DME in the home. Pt spouse/daughter will provide transportation at time of DC. IP CM will continue to follow.  Expected Discharge Plan: Home w Home Health Services Barriers to Discharge: Continued Medical Work up   Patient Goals and CMS Choice Patient states their goals for this hospitalization and ongoing recovery are:: To return to [place] [facility] CMS Medicare.gov Compare Post Acute Care list provided to:: Patient Choice offered to / list presented to : Patient [place] ownership interest in [facility].provided to:: Patient    Expected Discharge Plan and Services In-house Referral: NA Discharge Planning Services: CM Consult Post Acute Care Choice: Durable Medical Equipment, Home Health Living arrangements for the past 2 months: Single Family Home                 DME Arranged: N/A DME Agency: NA       HH Arranged: NA HH Agency: NA        Prior Living Arrangements/Services Living arrangements for the past 2 months: Single Family Home Lives with:: Spouse Patient language and need for interpreter reviewed:: Yes Do you feel safe going back to the place where you live?: Yes      Need for Family Participation in Patient Care: Yes (Comment) Care giver support system in place?: Yes  (comment) Current home services: DME (Rollator, cane, BSC, Shower bench) Criminal Activity/Legal Involvement Pertinent to Current Situation/Hospitalization: No - Comment as needed  Activities of Daily Living   ADL Screening (condition at time of admission) Independently performs ADLs?: Yes (appropriate for developmental age) Is the patient deaf or have difficulty hearing?: Yes (hearing aids at bedside) Does the patient have difficulty seeing, even when wearing glasses/contacts?: No Does the patient have difficulty concentrating, remembering, or making decisions?: No  Permission Sought/Granted Permission sought to share information with : Family Supports, Oceanographer granted to share information with : Yes, Verbal Permission Granted  Share Information with NAME: Jashira, Cotugno (Spouse)  [phone]  Permission granted to share info w AGENCY: [facility]        Emotional Assessment Appearance:: Appears stated age Attitude/Demeanor/Rapport: Gracious Affect (typically observed): Accepting, Appropriate Orientation: : Oriented to Self, Oriented to Place, Oriented to  Time, Oriented to Situation Alcohol / Substance Use: Not Applicable Psych Involvement: No (comment)  Admission diagnosis:  Shock [facility]) [R57.9] Patient Active Problem List   Diagnosis Date Noted   Shock (HCC) 01/05/2024   Hypercalcemia 11/22/2023   Closed C2 fracture (HCC) 11/02/2023   Acute encephalopathy 10/18/2023   Symptomatic anemia 09/22/2023   Aortic atherosclerosis 08/16/2022   Cervical spondylosis 07/13/2022   History of UTI 05/25/2022   Urge incontinence 05/25/2022   Rheumatoid arthritis (HCC)  Raynaud disease    PONV (postoperative nausea and vomiting)    Personal history of radiation therapy    Osteoarthritis    High cholesterol    Environmental allergies    Complication of anesthesia    OAB (overactive bladder) 02/2020   Age-related osteoporosis without current  pathological fracture 10/31/2019   History of vertebral compression fracture 07/16/2019   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   Pleuritic chest pain 04/10/2018   GAD (generalized anxiety disorder) 04/25/2017   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   LBBB (left bundle branch block) 03/22/2017   Pre-operative cardiovascular examination 03/22/2017   Mixed dyslipidemia 03/22/2017   Mixed hyperlipidemia 03/22/2017   Malignant neoplasm of central portion of left breast in female, estrogen receptor positive (HCC) 03/16/2017   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/2017   Osteopenia 03/03/2017   Cancer (HCC) 01/2017   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   Lumbar degenerative disc disease 02/27/2015   Lumbar spinal stenosis 02/27/2015   Osteoarthritis of spine with radiculopathy, lumbar region 02/27/2015   Basal cell carcinoma 12/15/2014   Closed compression fracture of L1 lumbar vertebra, with routine healing, subsequent encounter 11/26/2013   Insomnia 02/02/2010   Hyperlipidemia 04/22/2009   Essential hypertension 09/19/2008   Allergic rhinitis 07/31/2007   IBS (irritable bowel syndrome) 08/18/2006   Raynaud's disease 08/18/2006   PCP:  Burney Darice CROME, MD Pharmacy:   [id number] - [place], [place] - [address] [place] [address] Phone: [phone] Fax: [phone]     Social Drivers of Health (SDOH) Social History: SDOH Screenings   Food Insecurity: No Food Insecurity (01/05/2024)  Housing: Low Risk  (01/05/2024)  Transportation Needs: No Transportation Needs (01/05/2024)  Utilities: Not At Risk (01/05/2024)  Depression (PHQ2-9): Low Risk  (03/20/2020)  Financial Resource Strain: Low Risk  (05/03/2021)   Received from [facility]  Physical Activity: Insufficiently Active (05/03/2021)   Received from [facility]  Social Connections: Socially  Integrated (01/05/2024)  Stress: No Stress Concern Present (05/03/2021)   Received from [facility]  Tobacco Use: Medium Risk (01/08/2024)   SDOH Interventions:     Readmission Risk Interventions    01/08/2024    1:39 PM  Readmission Risk Prevention Plan  Transportation Screening Complete  Medication Review (RN Care Manager) Complete  PCP or Specialist appointment within 3-5 days of discharge Complete  HRI or Home Care Consult Complete  SW Recovery Care/Counseling Consult Complete  Palliative Care Screening Not Applicable  Skilled Nursing Facility Not Applicable

## 2024-01-08 NOTE — Progress Notes (Addendum)
  Progress Note   Patient: Heidi Lutz FMW:[id number] DOB: 1940-04-28 DOA: 01/05/2024     3 DOS: the patient was seen and examined on 01/08/2024 at 9:23AM      Brief hospital course: 83 y.o. 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     Assessment and Plan: Hypovolemic and septic shock due to likely pneumonia   Admit experiencing fatigue and low blood pressure for several days, following developed dizziness, nausea, double vision and then finally family brought her due to pallor, weakness.  In the ER, hypotensive.  CXR with left base opacity.  Admitted and started on Levophed and broad antibiotics.  Blood cultures no growth New fever 100.6 today - Continue linezolid, azithromycin, Rocephin -Stop midodrine  Community-acquired pneumonia MRSA positive -Continue linezolid (end date: 01/10/2024) -Continue Rocephin (end date: 01/09/2024) -Continue Zithromax (end date: 01/07/2024)   Acute renal failure Hyperkalemia Creatinine 3.3 on admission, potassium elevated.  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  Transfused 2uPRBCs on admission 11/14, Hgb stable 9.;7 today, platelets trending down - Trend Hgb/Plts - Retacrit 40K tomorrow  Hypertension  Pressure soft - Hold nifedipine   CKD 3B Baseline creatinine around 1.3-1.5 - Hold Kerendia  - Trend Cr  Hyponatremia Resolved with fluids        Subjective: Patient nauseated with standing today, overall improving, but still quite weak, no chest pain.  She did have a fever today, no confusion, no respiratory distress,     Physical Exam: BP (!)  118/41 (BP Location: Left Arm)   Pulse 62   Temp 98.2 F (36.8 C) (Oral)   Resp 20   Ht 5' 2.5 (1.588 m)   Wt 55.1 kg   LMP  (LMP Unknown)   SpO2 95%   BMI 21.86 kg/m   Frail elderly female, lying in bed, interactive and appropriate RRR, systolic murmur noted, no peripheral edema Respiratory normal, crackles at the right base Abdomen soft, no tenderness to palpation, no masses, no rigidity or rebound Attention normal, oriented x 3, face symmetric, speech fluent, generalized weakness but symmetric strength in the upper extremities    Data Reviewed: Discussed with hematology Basic metabolic panel shows creatinine down to 1.4 CBC shows no leukocytosis, hemoglobin down to 9.7, platelets 81,000     Family Communication: Daughter and husband at the bedside    Disposition: Status is: Inpatient         Author: Lonni SHAUNNA Dalton, MD 01/08/2024 2:24 PM  For on call review [email or web address].

## 2024-01-08 NOTE — Evaluation (Addendum)
 Physical Therapy Evaluation Patient Details Name: Heidi Lutz MRN: [id number] DOB: [birth year] Today's Date: 01/08/2024  History of Present Illness  Pt is 83 yo female admitted on 01/05/24 with symptomatic anemia and hypovolemic shock and PNE requiring ICU level care. Transferred out of ICU 11/16.  Pt with hx including but not limited to MDS, remote hx of breast cancer, and  protein calorie malnutrition  Clinical Impression  Pt admitted with above diagnosis. At baseline, pt resides with spouse at ILF and was getting HHPT.  She is normally ambulatory without AD in home and rollator in community.  Pt with recent neck injury July 2025 with sx Sept 2025 and getting HHPT.  Today, pt able to complete OOB activity with CGA for safety.  She only ambulated 5'x2 but was mostly limited by nausea.  Orthostatic BP stable (pt reports BP runs low baseline).  Encouraged pt to be OOB with nursing for toileting - pt agrees.  She feels that she will progress and able to return to ILF with support and HHPT - PT agrees.   Pt currently with functional limitations due to the deficits listed below (see PT Problem List). Pt will benefit from acute skilled PT to increase their independence and safety with mobility to allow discharge.          01/08/24 1100  Vital Signs  Patient Position (if appropriate) Orthostatic Vitals  Orthostatic Lying   BP- Lying 106/45  Pulse- Lying 56  Orthostatic Sitting  BP- Sitting 121/46  Pulse- Sitting 64  Orthostatic Standing at 0 minutes  BP- Standing at 0 minutes 106/54  Pulse- Standing at 0 minutes 66  Orthostatic Standing at 3 minutes  BP- Standing at 3 minutes 109/48  Pulse- Standing at 3 minutes 66      If plan is discharge home, recommend the following: A little help with walking and/or transfers;A little help with bathing/dressing/bathroom;Assistance with cooking/housework;Help with stairs or ramp for entrance   Can travel by private vehicle        Equipment  Recommendations None recommended by PT  Recommendations for Other Services       Functional Status Assessment Patient has had a recent decline in their functional status and demonstrates the ability to make significant improvements in function in a reasonable and predictable amount of time.     Precautions / Restrictions Precautions Precautions: Cervical;Fall Precaution/Restrictions Comments: Cervical sx in September - still wears brace in community and when in car      Mobility  Bed Mobility Overal bed mobility: Needs Assistance Bed Mobility: Supine to Sit     Supine to sit: Min assist     General bed mobility comments: Min A to scoot - limited by fricition from sheets and difficulty sliding forward    Transfers Overall transfer level: Needs assistance Equipment used: Rolling walker (2 wheels) Transfers: Sit to/from Stand Sit to Stand: Contact guard assist           General transfer comment: CGA for safety; STS x 3 during session with initial cues for hand placement    Ambulation/Gait Ambulation/Gait assistance: Contact guard assist Gait Distance (Feet): 5 Feet (5'x2) Assistive device: Rolling walker (2 wheels) Gait Pattern/deviations: Step-through pattern, Decreased stride length Gait velocity: decreased     General Gait Details: Ambulated to [facility] then to recliner, CGA for safety, further limited by nausea  Stairs            Wheelchair Mobility     Tilt Bed    Modified  Rankin (Stroke Patients Only)       Balance Overall balance assessment: Needs assistance Sitting-balance support: No upper extremity supported Sitting balance-Leahy Scale: Good     Standing balance support: Bilateral upper extremity supported, Reliant on assistive device for balance, No upper extremity supported Standing balance-Leahy Scale: Fair Standing balance comment: static stand without AD                             Pertinent Vitals/Pain Pain  Assessment Pain Assessment: No/denies pain    Home Living Family/patient expects to be discharged to:: Private residence Living Arrangements: Spouse/significant other Available Help at Discharge: Family;Available 24 hours/day Type of Home: Independent living facility Home Access: Level entry       Home Layout: One level Home Equipment: Rollator (4 wheels);Shower seat;Grab bars - toilet;Grab bars - tub/shower Additional Comments: Independent living apartment at [facility] - has access to w/c at [facility]    Prior Function Prior Level of Function : Independent/Modified Independent;History of Falls (last six months)             Mobility Comments: In apartments ambulates without AD; in community wears neck brace and uses rollator ; Reports suppoused to wear neck brace in car - follows up with MD in January; Is getting HHPT for her neck 2 x week ADLs Comments: Independent with adls; has assist for iadls     Extremity/Trunk Assessment   Upper Extremity Assessment Upper Extremity Assessment: RUE deficits/detail;LUE deficits/detail RUE Deficits / Details: Shoulder ROM very limited baseline; MMT shoulder 1/5, elbow, wrist, and hand 5/5 LUE Deficits / Details: Shoulder ROM limited to ~80 degrees baseline; MMT: shoulder 3/5, elbow, wrist, hand 5/5    Lower Extremity Assessment Lower Extremity Assessment: LLE deficits/detail;RLE deficits/detail RLE Deficits / Details: ROM WFL; MMT 5/5 LLE Deficits / Details: ROM WFL; MMT 5/5    Cervical / Trunk Assessment Cervical / Trunk Assessment: Neck Surgery;Other exceptions Cervical / Trunk Exceptions: Neck fx in July with Sx in September - limited ROM, still getting HHPT, still wears brace in community and car  Communication        Cognition Arousal: Alert Behavior During Therapy: WFL for tasks assessed/performed   PT - Cognitive impairments: No apparent impairments                                 Cueing       General  Comments General comments (skin integrity, edema, etc.): Pt had some nausea and vomiting at EOB ; denied lightheadedness    Exercises     Assessment/Plan    PT Assessment Patient needs continued PT services  PT Problem List Decreased strength;Decreased mobility;Cardiopulmonary status limiting activity;Decreased balance;Decreased knowledge of use of DME;Decreased activity tolerance       PT Treatment Interventions DME instruction;Therapeutic exercise;Gait training;Functional mobility training;Therapeutic activities;Patient/family education;Balance training    PT Goals (Current goals can be found in the Care Plan section)  Acute Rehab PT Goals Patient Stated Goal: return home to [facility] and carry on HHPT PT Goal Formulation: With patient/family Time For Goal Achievement: 01/22/24 Potential to Achieve Goals: Good    Frequency Min 2X/week     Co-evaluation               AM-PAC PT 6 Clicks Mobility  Outcome Measure Help needed turning from your back to your side while in a flat bed without using bedrails?:  A Little Help needed moving from lying on your back to sitting on the side of a flat bed without using bedrails?: A Little Help needed moving to and from a bed to a chair (including a wheelchair)?: A Little Help needed standing up from a chair using your arms (e.g., wheelchair or bedside chair)?: A Little Help needed to walk in hospital room?: A Little Help needed climbing 3-5 steps with a railing? : A Little 6 Click Score: 18    End of Session Equipment Utilized During Treatment: Gait belt Activity Tolerance: Other (comment) (limited by nausea) Patient left: with call bell/phone within reach;in chair;with family/visitor present Nurse Communication: Mobility status PT Visit Diagnosis: Other abnormalities of gait and mobility (R26.89);Muscle weakness (generalized) (M62.81)    Time: 8954-8879 PT Time Calculation (min) (ACUTE ONLY): 35 min   Charges:   PT  Evaluation $PT Eval Low Complexity: 1 Low PT Treatments $Gait Training: 8-22 mins PT General Charges $$ ACUTE PT VISIT: 1 Visit         Benjiman, PT Acute Rehab [facility] [phone]   Benjiman VEAR Mulberry 01/08/2024, 12:06 PM

## 2024-01-09 ENCOUNTER — Inpatient Hospital Stay

## 2024-01-09 ENCOUNTER — Other Ambulatory Visit (HOSPITAL_COMMUNITY): Payer: Self-pay

## 2024-01-09 ENCOUNTER — Inpatient Hospital Stay: Admitting: Hematology and Oncology

## 2024-01-09 DIAGNOSIS — R579 Shock, unspecified: Secondary | ICD-10-CM | POA: Diagnosis not present

## 2024-01-09 LAB — CBC
HCT: 30.6 % — ABNORMAL LOW (ref 36.0–46.0)
Hemoglobin: 9.8 g/dL — ABNORMAL LOW (ref 12.0–15.0)
MCH: 33.6 pg (ref 26.0–34.0)
MCHC: 32 g/dL (ref 30.0–36.0)
MCV: 104.8 fL — ABNORMAL HIGH (ref 80.0–100.0)
Platelets: 71 K/uL — ABNORMAL LOW (ref 150–400)
RBC: 2.92 MIL/uL — ABNORMAL LOW (ref 3.87–5.11)
RDW: 21.7 % — ABNORMAL HIGH (ref 11.5–15.5)
WBC: 4.3 K/uL (ref 4.0–10.5)
nRBC: 0 % (ref 0.0–0.2)

## 2024-01-09 LAB — COMPREHENSIVE METABOLIC PANEL WITH GFR
ALT: <5 U/L (ref 0–44)
AST: 17 U/L (ref 15–41)
Albumin: 3.2 g/dL — ABNORMAL LOW (ref 3.5–5.0)
Alkaline Phosphatase: 41 U/L (ref 38–126)
Anion gap: 8 (ref 5–15)
BUN: 15 mg/dL (ref 8–23)
CO2: 23 mmol/L (ref 22–32)
Calcium: 7.7 mg/dL — ABNORMAL LOW (ref 8.9–10.3)
Chloride: 107 mmol/L (ref 98–111)
Creatinine, Ser: 1.42 mg/dL — ABNORMAL HIGH (ref 0.44–1.00)
GFR, Estimated: 36 mL/min — ABNORMAL LOW (ref >60)
Glucose, Bld: 105 mg/dL — ABNORMAL HIGH (ref 70–99)
Potassium: 4.2 mmol/L (ref 3.5–5.1)
Sodium: 138 mmol/L (ref 135–145)
Total Bilirubin: 0.3 mg/dL (ref 0.0–1.2)
Total Protein: 5.7 g/dL — ABNORMAL LOW (ref 6.5–8.1)

## 2024-01-09 MED ORDER — EPOETIN ALFA-EPBX 10000 UNIT/ML IJ SOLN
40000.0000 [IU] | Freq: Once | INTRAMUSCULAR | Status: AC
  Administered 2024-01-09: 40000 [IU] via SUBCUTANEOUS
  Filled 2024-01-09: qty 4

## 2024-01-09 MED ORDER — LINEZOLID 600 MG PO TABS
600.0000 mg | ORAL_TABLET | Freq: Two times a day (BID) | ORAL | 0 refills | Status: AC
  Filled 2024-01-09: qty 7, 4d supply, fill #0

## 2024-01-09 MED ORDER — CEFDINIR 300 MG PO CAPS
300.0000 mg | ORAL_CAPSULE | Freq: Two times a day (BID) | ORAL | 0 refills | Status: AC
  Filled 2024-01-09: qty 7, 4d supply, fill #0

## 2024-01-09 NOTE — Plan of Care (Signed)

## 2024-01-09 NOTE — TOC Transition Note (Signed)
 Transition of Care [facility]) - Discharge Note   Patient Details  Name: Heidi Lutz MRN: [id number] Date of Birth: [birth year]  Transition [facility]) CM/SW Contact:  Heather DELENA Saltness, LCSW Phone Number: 01/09/2024, 2:25 PM   Clinical Narrative:    Pt discharging home to [facility] [place] apartment today. Pt will continue HH PT/OT/RN provided through [facility]. HH orders placed in pt's D/C packet at RN station. Pt's spouse and/or daughter will provide transportation home upon discharge. No further TOC needs at this time.   Final next level of care: Home w Home Health Services Barriers to Discharge: Barriers Resolved   Patient Goals and CMS Choice Patient states their goals for this hospitalization and ongoing recovery are:: To return to [facility] ILF CMS Medicare.gov Compare Post Acute Care list provided to:: Patient Choice offered to / list presented to : Patient [place] ownership interest in [facility].provided to:: Patient    Discharge Placement Home  Patient to be transferred to facility by: Pt's spouse or daughter Name of family member notified: Patient Patient and family notified of of transfer: 01/09/24  Discharge Plan and Services Additional resources added to the After Visit Summary for  Follow Up In-house Referral: NA Discharge Planning Services: CM Consult Post Acute Care Choice: Durable Medical Equipment, Home Health          DME Arranged: N/A DME Agency: NA       HH Arranged: PT, OT, RN HH Agency: [place] [facility] Date HH Agency Contacted: 01/09/24 Time HH Agency Contacted: 1424 Representative spoke with at [facility] Agency: N/A  Social Drivers of Health (SDOH) Interventions SDOH Screenings   Food Insecurity: No Food Insecurity (01/05/2024)  Housing: Low Risk  (01/05/2024)  Transportation Needs: No Transportation Needs (01/05/2024)  Utilities: Not At Risk (01/05/2024)  Depression (PHQ2-9): Low Risk  (03/20/2020)  Financial Resource  Strain: Low Risk  (05/03/2021)   Received from [facility]  Physical Activity: Insufficiently Active (05/03/2021)   Received from [facility]  Social Connections: Socially Integrated (01/05/2024)  Stress: No Stress Concern Present (05/03/2021)   Received from [facility]  Tobacco Use: Medium Risk (01/08/2024)     Readmission Risk Interventions    01/08/2024    1:39 PM  Readmission Risk Prevention Plan  Transportation Screening Complete  Medication Review (RN Care Manager) Complete  PCP or Specialist appointment within 3-5 days of discharge Complete  HRI or Home Care Consult Complete  SW Recovery Care/Counseling Consult Complete  Palliative Care Screening Not Applicable  Skilled Nursing Facility Not Applicable    Signed: Heather Saltness, MSW, LCSW Clinical Social Worker Inpatient Care Management 01/09/2024 2:27 PM

## 2024-01-09 NOTE — Discharge Summary (Signed)
 Physician Discharge Summary   Patient: Heidi Lutz MRN: [id number] DOB: 1940/08/05  Admit date:     01/05/2024  Discharge date: 01/09/24  Discharge Physician: Lonni SHAUNNA Dalton   PCP: Burney Darice CROME, MD     Recommendations at discharge:  Follow up with PCP Dr. Burney in 1 week for pneumonia Dr. Burney: Please check BP and resume nifedipine  and Kerendia  as appropriate  Please check CBC in 1 week and fax to Dr. Odean Salvage Course: 83 y.o. 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     Hypovolemic and septic shock due to likely pneumonia   Admitted for fatigue and low blood pressure for several days, then developed dizziness, nausea, double vision and then finally family brought her due to pallor, weakness.   In the ER, hypotensive.  CXR with left base opacity.  Admitted and started on Levophed and broad spectrum antibiotics.  Blood cultures no growth.  Patient now mentating at baseline, taking orals.  Temp < 100 F, heart rate < 100bpm, RR < 24, SpO2 at baseline.   Stable for discharge on cefdinir/Linezolid for 3 more days       Acute renal failure Hyperkalemia CKD IIIb Creatinine 3.3 on admission, potassium elevated.  Treated with IV fluids, creatinine resolved to baseline 1.3-1.5.  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  Discussed with hematology.  Will trend platelets as an oupatient.  No evidence of bleeding.      Hypertension  BP low normal off meds.  Nifedipine  and Kerendia  held at discharge, recommend PCP follow up prior to resumption.      Hyponatremia Resolved with fluids             The  [place]  Controlled Substances Registry was reviewed for this patient prior to discharge.   Consultants: Critical care   Disposition: Home health Diet recommendation:  Regular diet  DISCHARGE MEDICATION: Allergies as of 01/09/2024       Reactions   Fluorouracil Hives   Other Hives, Itching, Swelling, Rash, Other (See Comments)   NO -CILLIN(s)   Clavulanic Acid Other (See Comments)   Clavulanic acid is a medication that can be used in conjunction with amoxicillin to manage and treat bacterial infections, specifically bacteria that are beta-lactamase producers. It is in the beta-lactamase inhibitor class of medications. Reaction undefined, but is allergic if it is paired with any -cillin(s)   Codeine Nausea And Vomiting   Hydrocodone  Nausea And Vomiting   Penicillins Hives, Swelling, Rash, Other (See Comments)   Pt states allergic to all cillin drugs and Joint pain        Medication List     PAUSE taking these medications    Kerendia  10 MG Tabs Wait to take this until your doctor or other care provider tells you to start again. Generic drug: Finerenone  Take 10 mg by mouth in the morning.   NIFEdipine  90 MG 24 hr tablet Wait to take this until your doctor or other care provider tells you to start again. Commonly known as: PROCARDIA  XL/NIFEDICAL-XL Take 90 mg by mouth See admin instructions. Take 90 mg by mouth in the morning and HOLD FOR A LOW  B/P       TAKE these medications    albuterol  108 (90 Base) MCG/ACT inhaler Commonly known as: VENTOLIN  HFA Inhale 2 puffs into the lungs every 6 (six) hours as needed for wheezing or shortness of breath.   atorvastatin  20 MG tablet Commonly known as: LIPITOR Take 20 mg by mouth at bedtime.   Black Cohosh 540 MG Caps Take 540 mg by mouth daily.   Breztri  Aerosphere 160-9-4.8 MCG/ACT Aero inhaler Generic drug: budesonide -glycopyrrolate -formoterol  Inhale 2 puffs into the lungs 2 (two) times daily as needed (for  flares).   cefdinir 300 MG capsule Commonly known as: OMNICEF Take 1 capsule (300 mg total) by mouth 2 (two) times daily.   cyclobenzaprine  5 MG tablet Commonly known as: FLEXERIL  Take 1 tablet (5 mg total) by mouth 3 (three) times daily as needed for muscle spasms.   docusate sodium  100 MG capsule Commonly known as: COLACE Take 1 capsule (100 mg total) by mouth 2 (two) times daily.   DYNAGINATE AG SILVER CAL 4X5 EX Apply topically See admin instructions. Cut to size and apply to wound on right gluteal cheek with dressing change once a day   famotidine  40 MG tablet Commonly known as: PEPCID  Take 0.5 tablets (20 mg total) by mouth daily as needed for heartburn or indigestion. What changed: when to take this   fosfomycin 3 g Pack Commonly known as: MONUROL  MIX AND STIR 1 PACKET INTO 3-4 OZ OF WATER AND DRINK EVERY 10 DAYS AS DIRECTED. TAKE IMMEDIATELY AFTER DISSOLVING What changed:  how much to take how to take this when to take this additional instructions   Glucosamine-Chondroitin Max St Caps Take 1 capsule by mouth daily.   HYDROcodone -acetaminophen  5-325 MG tablet Commonly known as: NORCO/VICODIN Take 1-2 tablets by mouth every 6 (six) hours as needed for moderate pain (pain score 4-6) or severe pain (pain score 7-10).   lidocaine  5 % Commonly known as: LIDODERM  APPLY 2 PATCHES TO SKIN EVERY DAY, REMOVE AND REPLACE PATCH AFTER 12 HRS   linezolid 600 MG tablet Commonly known as: ZYVOX Take 1 tablet (600 mg total) by mouth 2 (two) times daily.   loratadine  10 MG tablet Commonly known as: CLARITIN  Take 10 mg by mouth daily as needed for allergies or rhinitis.   megestrol 625 MG/5ML suspension Commonly known as: MEGACE ES Take 5 mLs (625 mg total) by mouth daily.   multivitamin Tabs tablet Take 1 tablet by mouth daily.   mupirocin  ointment 2 % Commonly known as: BACTROBAN  Place 1 Application into the nose 2 (two) times daily.   ondansetron  4 MG  tablet Commonly known as: ZOFRAN  Take 1 tablet (4 mg total) by mouth every 6 (six) hours as needed for nausea or vomiting.   pregabalin  50 MG capsule Commonly known as: LYRICA  Take 100 mg by mouth at bedtime.   PROBIOTIC ADVANCED PO Take 1 capsule by mouth daily.   Prolia 60 MG/ML Sosy injection Generic drug: denosumab Inject 60 mg into the skin every 6 (six) months.   solifenacin  10 MG tablet Commonly known as: VESICARE  Take 1 tablet (10 mg total) by mouth every evening.   tamoxifen  20 MG tablet Commonly known as: NOLVADEX  TAKE 1 TABLET EVERY DAY   Tylenol  8 Hour Arthritis Pain 650 MG CR tablet Generic drug: acetaminophen  Take 1,300 mg by mouth daily as needed for pain.        Follow-up Information     Burney Darice CROME, MD. Schedule an appointment as soon  as possible for a visit in 1 week(s).   Specialty: Family Medicine Contact information: [address] [address] [place] [address] [phone]         Odean Potts, MD Follow up.   Specialty: Hematology and Oncology Contact information: [address] [place] [address] [phone]                 Discharge Instructions     Discharge instructions   Complete by: As directed    **IMPORTANT DISCHARGE INSTRUCTIONS**   From Dr. Jonel: You were admitted for pneumonia sepsis You were treated with broad spectrum antibiotics and improved  Complete antibiotics with 3 more days of Augmentin and Linezolid Take Augmentin 875-125 mg twice daily starting tonight Take Linezolid 600 mg twice daily starting tonight Your last dose should be Friday night  Resume ALL your home medicines without change EXCEPT nifedipine  and Kerendia  Hold (do not take) these two for now, until you see your primary doctor   Go see your primary doctor in 1 week Let them know if your symptoms are resolved Have htem check your blood counts and kidney function in 1 week Ask her if/when you should  resume Kerendia  and nifedipine    Increase activity slowly   Complete by: As directed    No wound care   Complete by: As directed        Discharge Exam: Filed Weights   01/07/24 0452 01/08/24 1016 01/09/24 0530  Weight: 55.2 kg 55.1 kg 55.2 kg    General: Pt is alert, awake, not in acute distress Cardiovascular: RRR, nl S1-S2, no murmurs appreciated.   No LE edema.   Respiratory: Normal respiratory rate and rhythm.  CTAB without rales or wheezes. Abdominal: Abdomen soft and non-tender.  No distension or HSM.   Neuro/Psych: Strength symmetric in upper and lower extremities.  Judgment and insight appear normal.   Condition at discharge: good  The results of significant diagnostics from this hospitalization (including imaging, microbiology, ancillary and laboratory) are listed below for reference.   Imaging Studies: DG Chest Port 1 View Result Date: 01/05/2024 EXAM: 1 VIEW(S) XRAY OF THE CHEST 01/05/2024 02:09:00 PM COMPARISON: None available. CLINICAL HISTORY: Questionable sepsis - evaluate for abnormality FINDINGS: LUNGS AND PLEURA: Hazy opacity at left lung base, likely atelectasis. No pleural effusion. No pneumothorax. HEART AND MEDIASTINUM: Aortic atherosclerosis. No acute abnormality of the cardiac and mediastinal silhouettes. BONES AND SOFT TISSUES: Surgical clips in left breast. Degenerative changes of right shoulder. Left reverse total shoulder arthroplasty noted. IMPRESSION: 1. Hazy opacity at the left lung base, likely atelectasis. 2. Aortic Atherosclerosis (ICD10-I70.0). Electronically signed by: Ryan Chess MD 01/05/2024 02:33 PM EST RP Workstation: [id number]   US  Venous Img Lower  Left (DVT Study) Result Date: 12/24/2023 CLINICAL DATA:  Pain EXAM: LEFT LOWER EXTREMITY VENOUS DOPPLER ULTRASOUND TECHNIQUE: Gray-scale sonography with compression, as well as color and duplex ultrasound, were performed to evaluate the deep venous system(s) from the level of the common femoral  vein through the popliteal and proximal calf veins. COMPARISON:  None Available. FINDINGS: VENOUS Normal compressibility of the common femoral, superficial femoral, and popliteal veins, as well as the visualized calf veins. Visualized portions of profunda femoral vein and great saphenous vein unremarkable. No filling defects to suggest DVT on grayscale or color Doppler imaging. Doppler waveforms show normal direction of venous flow, normal respiratory plasticity and response to augmentation. Limited views of the contralateral common femoral vein are unremarkable. OTHER None. Limitations: none IMPRESSION: 1.  No evidence of deep venous thrombosis within the left lower extremity. Electronically Signed   By: Ozell Daring M.D.   On: 12/24/2023 18:46   DG Foot Complete Left Result Date: 12/24/2023 EXAM: 3 OR MORE VIEW(S) XRAY OF THE LEFT FOOT 12/24/2023 05:46:00 PM COMPARISON: None available. CLINICAL HISTORY: 855384 Pain [id number] Pain FINDINGS: BONES AND JOINTS: No acute fracture. Surgical screw is seen in proximal first metatarsal. Surgical screw was also seen in distal second metatarsal. Moderate degenerative change is seen involving first metatarsophalangeal joint. No joint dislocation. SOFT TISSUES: Vascular calcifications are noted. IMPRESSION: 1. Moderate degenerative change involving the first metatarsophalangeal joint. 2. Surgical screws in the proximal first metatarsal and distal second metatarsal. 3. Vascular calcifications. Electronically signed by: Lynwood Seip MD 12/24/2023 06:00 PM EST RP Workstation: [id number]    Microbiology: Results for orders placed or performed during the hospital encounter of 01/05/24  Blood Culture (routine x 2)     Status: None (Preliminary result)   Collection Time: 01/05/24  1:32 PM   Specimen: BLOOD  Result Value Ref Range Status   Specimen Description   Final    BLOOD LEFT ANTECUBITAL Performed at [facility], [address], [place],  [place] [address]    Special Requests   Final    BOTTLES DRAWN AEROBIC AND ANAEROBIC Blood Culture results may not be optimal due to an inadequate volume of blood received in culture bottles Performed at [facility], [address] [place], [place] [address]    Culture   Final    NO GROWTH 4 DAYS Performed at [facility], [address] [place], [place] [address]    Report Status PENDING  Incomplete  Blood Culture (routine x 2)     Status: None (Preliminary result)   Collection Time: 01/05/24  1:41 PM   Specimen: BLOOD LEFT WRIST  Result Value Ref Range Status   Specimen Description   Final    BLOOD LEFT WRIST Performed at [facility], [address] [place], [place] [address]    Special Requests   Final    BOTTLES DRAWN AEROBIC AND ANAEROBIC Blood Culture adequate volume Performed at [facility], [address], [place], [place] [address]    Culture   Final    NO GROWTH 4 DAYS Performed at [facility], [address], [place], [place] [address]    Report Status PENDING  Incomplete  MRSA Next Gen by PCR, Nasal     Status: Abnormal   Collection Time: 01/05/24  8:51 PM   Specimen: Nasal Mucosa; Nasal Swab  Result Value Ref Range Status   MRSA by PCR Next Gen DETECTED (A) NOT DETECTED Final    Comment: CRITICAL RESULT CALLED TO, READ BACK BY AND VERIFIED WITH: FRY, A. RN AT 2336 01/05/2024 BY JEREMY C (NOTE) The GeneXpert MRSA Assay (FDA approved for NASAL specimens only), is one component of a comprehensive MRSA colonization surveillance program. It is not intended to diagnose MRSA infection nor to guide or monitor treatment for MRSA infections. Test performance is not FDA approved in patients less than 66 years old. Performed at [facility], [address], [place], [place] [address]     Labs: CBC: Recent Labs  Lab 01/05/24 1332 01/05/24 1439 01/05/24 2241 01/06/24 0727 01/08/24 0711  01/09/24 1035  WBC 5.3  --  4.9 6.0 4.3 4.3  NEUTROABS 2.6  --   --   --   --   --  HGB 7.0* 5.8* 8.9* 10.5* 9.7* 9.8*  HCT 21.9* 17.0* 26.8* 32.1* 30.0* 30.6*  MCV 114.1*  --  104.7* 102.2* 106.0* 104.8*  PLT 114*  --  106* 103* 81* 71*   Basic Metabolic Panel: Recent Labs  Lab 01/05/24 1332 01/05/24 1439 01/05/24 1506 01/06/24 0727 01/08/24 0711 01/09/24 1035  NA 133* 132* 132* 139 141 138  K 6.2* 5.6* 5.6* 4.6 4.4 4.2  CL 101 102 102 108 111 107  CO2 22  --  21* 21* 21* 23  GLUCOSE 98 99 102* 108* 88 105*  BUN 66* 64* 64* 45* 23 15  CREATININE 3.06* 3.30* 2.88* 2.08* 1.39* 1.42*  CALCIUM  8.4*  --  8.1* 8.0* 7.7* 7.7*  MG  --   --  2.2  --   --   --    Liver Function Tests: Recent Labs  Lab 01/05/24 1332 01/08/24 0711 01/09/24 1035  AST 21 16 17   ALT <5 <5 <5  ALKPHOS 40 38 41  BILITOT 0.5 0.3 0.3  PROT 6.4* 5.3* 5.7*  ALBUMIN  3.5 2.9* 3.2*   CBG: Recent Labs  Lab 01/06/24 0745  GLUCAP 110*    Discharge time spent: approximately 45 minutes spent on discharge counseling, evaluation of patient on day of discharge, and coordination of discharge planning with nursing, social work, pharmacy and case management  Signed: Lonni SHAUNNA Dalton, MD [facility] 01/09/2024

## 2024-01-09 NOTE — Progress Notes (Addendum)
 AVS reviewed w/ patient and family in room. Start date and time corrected on AVS in pen and updated in EMR.  Patient dressed for discharge to home. PIV removed as noted. No other questions at this time. Discharge meds in a secure bag delivered to patient in room. Patient to lobby via w/c - home with family

## 2024-01-10 ENCOUNTER — Ambulatory Visit: Admitting: Urology

## 2024-01-10 LAB — CULTURE, BLOOD (ROUTINE X 2)
Culture: NO GROWTH
Culture: NO GROWTH
Special Requests: ADEQUATE

## 2024-01-10 NOTE — Progress Notes (Deleted)
 Assessment: 1. Urge incontinence   2. History of UTI      Plan: Continue Vesicare  10 mg in the PM  Continue vaginal hormone cream  Continue fosfomycin 3 g every 10 days for UTI prevention. Return to office in 6 months  Chief Complaint:  No chief complaint on file.   History of Present Illness:  Heidi Lutz is a 83 y.o. female who is seen for continued evaluation of her incontinence and history of UTIs. She was previously followed in [facility] and was last seen by me there in April 2022. She continued on Vesicare  10 mg daily.  She reported some worsening incontinence, primarily at night.  She also noted a pressure in the bladder area with sitting and at times while lying down.  No noticeable vaginal bulge.  She continue to use vaginal hormone cream 2-3 times per week.  She was also taking ciprofloxacin  250 mg daily per her PCP, Dr. Burney.  No recent urine culture results available.  She continued with symptoms of urgency, nocturia, urge incontinence.  No dysuria or gross hematuria. Urine culture from 06/20/2022 grew <10K colonies of mixed flora. At her visit in 5/24, she continued to have some intermittent dysuria.  She continued with constant pressure in the bladder area, increased with the need to void.  She was taking the Vesicare  at night and had noted an improvement in her incontinence. Resolve MDX culture from 06/23/2022 grew Enterococcus and Staphylococcus.  She was treated with Macrobid  x 7 days. She noted improvement in her symptoms after completing the Macrobid .  She had 2 episodes of UTI symptoms which resolved spontaneously. She continued on Solifenacin .  No recent dysuria or gross hematuria. Urinalysis from 07/19/2022 again showed pyuria.  Cath urine confirmed presence of pyuria.  Pelvic exam demonstrated atrophic changes and a grade 2 cystocele.  Residual was 15 mL. Urine culture grew 25-50 K E. coli on cath specimen.  She was treated with Cipro  x 5 days and started  on fosfomycin 3 g every 10 days for UTI prevention.  At her visit in June 2024, she was not having any UTI symptoms.  She continued on solifenacin  with good control of her urinary symptoms.  She had not started the fosfomycin.  At her visit in 11/24, she was doing very well from a urinary standpoint.  No recent UTI symptoms.  She continued to use the fosfomycin every 10 days.  Her incontinence was not a problem for her.  She had occasional incontinence. She continued on Solifenacin  and vaginal hormone cream.  No dysuria or gross hematuria.  At her visit in March 2025, she continued to do very well on her current regimen of fosfomycin 3 g every 10 days.  She had not had any recent UTI symptoms.  She also continued on Solifenacin  10 mg nightly.  This was controlling her incontinence very well.  She was also using vaginal hormone cream 2 times per week.  Overall, she was very pleased with her situation.  Urologic history: At her initial visit in January 2022, she reported symptoms of bladder pressure, frequency, urgency, and nocturia x 2.  She also reported occasional dysuria.  She was having urge incontinence and using 3-6 pads per day.  Her last UTI was approximately 6 months prior.  She was not having any UTI symptoms.  No history of fecal incontinence.  No prior medical therapy for her urinary symptoms.  She was given a trial of Myrbetriq and instructed on a bladder diet.  She was subsequently changed to Vesicare  10 mg daily.  At her follow-up in April 2022 she was taking both Vesicare  and Myrbetriq.  Her urinary symptoms were improved.  She was not having any incontinence.  No UTI symptoms.  It was recommended that she discontinue the Myrbetriq and continue Vesicare . She was subsequently seen by Dr. Reissmann.  She was started on vaginal hormone cream.  Her primary care doctor had started her on low-dose ciprofloxacin  for prophylaxis for UTIs.  Vesicare  was working well for her at the time of her last  visit in October 2023.  Urine culture results: 1/22 50-100 K E. Coli 2/22 >100 K Enterococcus  Portions of the above documentation were copied from a prior visit for review purposes only.   Past Medical History:  Past Medical History:  Diagnosis Date   Age-related osteoporosis without current pathological fracture 10/31/2019   Allergic rhinitis 07/31/2007   Overview:   S/p allergy shots, 20 years ago Dr. Mariea     10/1 IMO update   Anemia    Aortic atherosclerosis 08/16/2022   Basal cell carcinoma 12/15/2014   Overview:   Managed by Dr. Tricia, derm   Cancer [facility]) 01/2017   left breast cancer, basal cell and 1 melanoma   Cervical spondylosis 07/13/2022   Closed compression fracture of L1 lumbar vertebra, with routine healing, subsequent encounter 11/26/2013   Overview:   Normal DEXA 2015     Last Assessment & Plan:   New order for DEXA written today. Patient is getting epidural steroid injections with pain management  Overview:   Overview:   Normal DEXA 2015     Last Assessment & Plan:   continued improvement in pain, will refill tramadol  and robaxin today.  Will return to PCP for follow-up   Complication of anesthesia    heart rate spikeed in the PACU on her 2nd knee surgery   Environmental allergies    Essential hypertension 09/19/2008   Last Assessment & Plan:   Blood pressure is elevated today. I reviewed her last few pain management notes her blood pressure was normal. Patient is asymptomatic and I suspect today's blood pressure is an outlier. She will continue home monitoring at her offices and will return for further evaluation if remains elevated   GAD (generalized anxiety disorder) 04/25/2017   Last Assessment & Plan:   See a/p above   High cholesterol    History of UTI 05/25/2022   History of vertebral compression fracture 07/16/2019   Hypercalcemia 11/22/2023   Hyperlipidemia 04/22/2009   Last Assessment & Plan:   Compliant with statin, continue   IBS  (irritable bowel syndrome) 08/18/2006   Overview:   with IBS        Last Assessment & Plan:   Refill Bentyl for when necessary use. No evidence of infectious or inflammatory, or malignancy symptoms. Advised to follow-up for further evaluation if more frequent or new symptoms   Insomnia 02/02/2010   Last Assessment & Plan:   Refilled Ambien  quantity #30 for the next calendar year. She denies excessive sedation and is aware to not drive when taking medication.   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  Will start Lexapro today.  We discussed interim use of benzodiazepines for pre-procedural or severe episodes.  She reports she has had panic attacks in the past but has not had them recently.  She never took diazepam that was previously prescribed for a procedure.  We discussed   Malignant neoplasm of central portion of left breast in female, estrogen receptor positive (HCC) 03/16/2017   Mixed dyslipidemia 03/22/2017   Mixed hyperlipidemia 03/22/2017   OAB (overactive bladder) 02/2020   Osteoarthritis    Osteoarthritis of spine with radiculopathy, lumbar region 02/27/2015   Osteopenia 03/03/2017   Overview:   DEXA 02/2017   Personal history of radiation therapy    Pleuritic chest pain 04/10/2018   PONV (postoperative nausea and vomiting)    with 1st knee surgery had n/v, none since   Pre-operative cardiovascular examination 03/22/2017   Raynaud disease    Raynauds disease-takes procardia    Raynaud's disease 08/18/2006   Rheumatoid arthritis (HCC)    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 Signa -- [place], [place]   Urge incontinence 05/25/2022    Past Surgical History:  Past Surgical History:  Procedure Laterality Date   ABDOMINAL HYSTERECTOMY  1972   APPENDECTOMY  1953   BREAST BIOPSY Left 05/19/2020   benign   BREAST LUMPECTOMY Left 2019   BREAST LUMPECTOMY WITH RADIOACTIVE SEED AND SENTINEL LYMPH NODE BIOPSY Left 04/07/2017   Procedure: BREAST LUMPECTOMY WITH RADIOACTIVE SEED AND SENTINEL LYMPH NODE BIOPSY;  Surgeon: Vanderbilt Ned, MD;  Location: [facility] OR;  Service: General;  Laterality: Left;   BUNIONECTOMY  1999   COLONOSCOPY     EYE SURGERY     bil cataract   GANGLION CYST EXCISION  1968   JOINT REPLACEMENT Left 2017    reverse shoulder replacement   POSTERIOR CERVICAL FUSION/FORAMINOTOMY N/A 11/02/2023   Procedure: POSTERIOR CERVICAL FUSION/FORAMINOTOMY CERVICAL ONE-TWO;  Surgeon: Mavis Purchase, MD;  Location: [facility] OR;  Service: Neurosurgery;  Laterality: N/A;   REPLACEMENT TOTAL KNEE  2012, 2013   Right and Left   SHOULDER ARTHROSCOPY Left    SYMPATHECTOMY  1970   TONSILLECTOMY  1949    Allergies:  Allergies  Allergen Reactions   Fluorouracil Hives   Other Hives, Itching, Swelling, Rash and Other (See Comments)    NO -CILLIN(s)   Clavulanic Acid Other (See Comments)    Clavulanic acid is a medication that can be used in conjunction with amoxicillin to manage and treat bacterial infections, specifically bacteria that are beta-lactamase producers. It is in the beta-lactamase inhibitor class of medications. Reaction undefined, but is allergic if it is  paired with any -cillin(s)   Codeine Nausea And Vomiting   Hydrocodone  Nausea And Vomiting   Penicillins Hives, Swelling, Rash and Other (See Comments)    Pt states allergic to all cillin drugs and Joint pain    Family History:  Family History  Adopted: Yes  Problem Relation Age of Onset   Breast cancer Neg Hx     Social History:  Social History  Tobacco Use   Smoking status: Former    Current packs/day: 0.00    Types: Cigarettes    Quit date: 02/22/1963    Years since quitting: 60.9    Passive exposure: Past   Smokeless tobacco: Never  Vaping Use   Vaping status: Never Used  Substance Use Topics   Alcohol use: Yes    Alcohol/week: 14.0 standard drinks of alcohol    Types: 14 Glasses of wine per week    Comment: 2 glasses of wine daily.    Drug use: No    ROS: Constitutional:  Negative for fever, chills, weight loss CV: Negative for chest pain, previous MI, hypertension Respiratory:  Negative for shortness of breath, wheezing, sleep apnea, frequent cough GI:  Negative for nausea, vomiting, bloody stool, GERD  Physical exam: LMP  (LMP Unknown)  GENERAL APPEARANCE:  Well appearing, well developed, well nourished, NAD HEENT:  Atraumatic, normocephalic, oropharynx clear NECK:  Supple without lymphadenopathy or thyromegaly ABDOMEN:  Soft, non-tender, no masses EXTREMITIES:  Moves all extremities well, without clubbing, cyanosis, or edema NEUROLOGIC:  Alert and oriented x 3, normal gait, CN II-XII grossly intact MENTAL STATUS:  appropriate BACK:  Non-tender to palpation, No CVAT SKIN:  Warm, dry, and intact   Results: U/A:

## 2024-01-12 ENCOUNTER — Encounter: Payer: Self-pay | Admitting: Hematology and Oncology

## 2024-01-30 ENCOUNTER — Encounter: Payer: Self-pay | Admitting: Pulmonary Disease

## 2024-01-30 ENCOUNTER — Inpatient Hospital Stay

## 2024-01-30 ENCOUNTER — Other Ambulatory Visit: Payer: Self-pay | Admitting: *Deleted

## 2024-01-30 ENCOUNTER — Inpatient Hospital Stay: Admitting: Hematology and Oncology

## 2024-01-30 ENCOUNTER — Inpatient Hospital Stay: Attending: Hematology and Oncology

## 2024-01-30 VITALS — BP 90/60 | HR 76 | Temp 98.6°F | Resp 16 | Wt 112.6 lb

## 2024-01-30 DIAGNOSIS — D649 Anemia, unspecified: Secondary | ICD-10-CM

## 2024-01-30 DIAGNOSIS — M810 Age-related osteoporosis without current pathological fracture: Secondary | ICD-10-CM | POA: Diagnosis not present

## 2024-01-30 DIAGNOSIS — Z79899 Other long term (current) drug therapy: Secondary | ICD-10-CM | POA: Insufficient documentation

## 2024-01-30 DIAGNOSIS — D46Z Other myelodysplastic syndromes: Secondary | ICD-10-CM | POA: Insufficient documentation

## 2024-01-30 DIAGNOSIS — C50112 Malignant neoplasm of central portion of left female breast: Secondary | ICD-10-CM | POA: Diagnosis present

## 2024-01-30 DIAGNOSIS — Z170 Estrogen receptor positive status [ER+]: Secondary | ICD-10-CM | POA: Insufficient documentation

## 2024-01-30 LAB — CBC WITH DIFFERENTIAL (CANCER CENTER ONLY)
Abs Immature Granulocytes: 0.01 K/uL (ref 0.00–0.07)
Basophils Absolute: 0 K/uL (ref 0.0–0.1)
Basophils Relative: 1 %
Eosinophils Absolute: 0.2 K/uL (ref 0.0–0.5)
Eosinophils Relative: 6 %
HCT: 23.8 % — ABNORMAL LOW (ref 36.0–46.0)
Hemoglobin: 7.8 g/dL — ABNORMAL LOW (ref 12.0–15.0)
Immature Granulocytes: 0 %
Lymphocytes Relative: 50 %
Lymphs Abs: 1.4 K/uL (ref 0.7–4.0)
MCH: 33.6 pg (ref 26.0–34.0)
MCHC: 32.8 g/dL (ref 30.0–36.0)
MCV: 102.6 fL — ABNORMAL HIGH (ref 80.0–100.0)
Monocytes Absolute: 0.4 K/uL (ref 0.1–1.0)
Monocytes Relative: 15 %
Neutro Abs: 0.8 K/uL — ABNORMAL LOW (ref 1.7–7.7)
Neutrophils Relative %: 28 %
Platelet Count: 142 K/uL — ABNORMAL LOW (ref 150–400)
RBC: 2.32 MIL/uL — ABNORMAL LOW (ref 3.87–5.11)
RDW: 20.6 % — ABNORMAL HIGH (ref 11.5–15.5)
WBC Count: 2.9 K/uL — ABNORMAL LOW (ref 4.0–10.5)
nRBC: 0 % (ref 0.0–0.2)

## 2024-01-30 LAB — CMP (CANCER CENTER ONLY)
ALT: <5 U/L (ref 0–44)
AST: 14 U/L — ABNORMAL LOW (ref 15–41)
Albumin: 3.8 g/dL (ref 3.5–5.0)
Alkaline Phosphatase: 38 U/L (ref 38–126)
Anion gap: 10 (ref 5–15)
BUN: 22 mg/dL (ref 8–23)
CO2: 21 mmol/L — ABNORMAL LOW (ref 22–32)
Calcium: 8.6 mg/dL — ABNORMAL LOW (ref 8.9–10.3)
Chloride: 107 mmol/L (ref 98–111)
Creatinine: 1.47 mg/dL — ABNORMAL HIGH (ref 0.44–1.00)
GFR, Estimated: 35 mL/min — ABNORMAL LOW (ref >60)
Glucose, Bld: 101 mg/dL — ABNORMAL HIGH (ref 70–99)
Potassium: 5.2 mmol/L — ABNORMAL HIGH (ref 3.5–5.1)
Sodium: 139 mmol/L (ref 135–145)
Total Bilirubin: 0.5 mg/dL (ref 0.0–1.2)
Total Protein: 6.7 g/dL (ref 6.5–8.1)

## 2024-01-30 LAB — PREPARE RBC (CROSSMATCH)

## 2024-01-30 LAB — SAMPLE TO BLOOD BANK

## 2024-01-30 MED ORDER — DIPHENHYDRAMINE HCL 25 MG PO CAPS
25.0000 mg | ORAL_CAPSULE | Freq: Once | ORAL | Status: AC
  Administered 2024-01-30: 25 mg via ORAL
  Filled 2024-01-30: qty 1

## 2024-01-30 MED ORDER — EPOETIN ALFA-EPBX 40000 UNIT/ML IJ SOLN
40000.0000 [IU] | Freq: Once | INTRAMUSCULAR | Status: AC
  Administered 2024-01-30: 40000 [IU] via SUBCUTANEOUS
  Filled 2024-01-30: qty 1

## 2024-01-30 MED ORDER — SODIUM CHLORIDE 0.9 % IV SOLN: INTRAVENOUS | Status: DC

## 2024-01-30 MED ORDER — ACETAMINOPHEN 325 MG PO TABS
650.0000 mg | ORAL_TABLET | Freq: Once | ORAL | Status: AC
  Administered 2024-01-30: 650 mg via ORAL
  Filled 2024-01-30: qty 2

## 2024-01-30 MED ORDER — SODIUM CHLORIDE 0.9% IV SOLUTION
250.0000 mL | INTRAVENOUS | Status: DC
  Administered 2024-01-30: 100 mL via INTRAVENOUS

## 2024-01-30 MED ORDER — ZOLEDRONIC ACID 4 MG/5ML IV CONC: 3.0000 mg | Freq: Once | INTRAVENOUS | Status: DC

## 2024-01-30 NOTE — Assessment & Plan Note (Signed)
 Hospitalization 11/02/2023-11/03/2023: Closed C2 fracture Severe anemia: Previously 2 units of PRBC was given in the hospital B12: 895. Bone marrow biopsy: 12/04/2023: Flow cytometry: 7% abnormal blast population (positive for CD34, CD38, CD33, CD56, CD117 and HLA-DR), mildly hypercellular bone marrow 30% with trilineage hematopoiesis and no increase in plasma cells.   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 If cytopenias become more prominent, we may consider repeating a bone marrow biopsy and consideration for Vidaza plus or minus venetoclax.

## 2024-01-30 NOTE — Progress Notes (Signed)
 Per Dr. Odean NO Zometa  infusion today d/t Pt receiving Prolia at [facility]. Devan RPH in pharmacy aware. Zometa  orders for today discontinued per provider request.

## 2024-01-30 NOTE — Patient Instructions (Signed)

## 2024-01-30 NOTE — Progress Notes (Signed)
 Patient Care Team: Burney Darice CROME, MD as PCP - General (Family Medicine) Vanderbilt Ned, MD as Consulting Physician (General Surgery)  DIAGNOSIS:  Encounter Diagnoses  Name Primary?   Malignant neoplasm of central portion of left breast in female, estrogen receptor positive (HCC) Yes   MDS (myelodysplastic syndrome), high grade (HCC)     SUMMARY OF ONCOLOGIC HISTORY: Oncology History  Malignant neoplasm of central portion of left breast in female, estrogen receptor positive (HCC)  03/08/2017 Initial Diagnosis   Central left breast biopsy for a clinical T1b N0, stage IA i IDC, grade 1, e ER/PR positive, HER-2 not amplified, with an Ki-67 of 2%   03/16/2017 Cancer Staging   Staging form: Breast, AJCC 8th Edition - Clinical stage from 03/16/2017: Stage IA (cT1b, cN0, cM0, G1, ER+, PR+, HER2-) - Signed by Odean Potts, MD on 04/19/2022 Method of lymph node assessment: Clinical Histologic grading system: 3 grade system Laterality: Left Tumor size (mm): 8   04/07/2017 Surgery   left breast lumpectomy with sentinel lymph node sampling  pT1b pN0, stage IA invasive ductal carcinoma, grade II, with negative margins, total 1 lymph node removed from the axilla     CHIEF COMPLIANT: Follow-up regarding myelodysplastic syndrome  HISTORY OF PRESENT ILLNESS:   History of Present Illness Heidi Lutz is an 83 year old female with myelodysplastic syndrome with excess blasts-1 (MDS-EB1) who presents for follow-up of progressive anemia and leukopenia.  She is receiving Retacrit  for MDS-EB1 with injections on October 27 and November 18. Despite therapy, hemoglobin declined from 9.8 g/dL three weeks ago to 7.8 g/dL today. She notes increased fatigue and decreased stamina but has good appetite. She denies fever, bleeding, or infection symptoms.  Labs today show anemia with hemoglobin 7.8 g/dL, worsening leukopenia with white blood cell count 2.9, and improved platelets at 140 from 70. Bone  marrow mutation analysis shows ASXL1, TET2, TP53, and U2AF1 mutations. She reports no significant adverse effects from Retacrit .  Dec 11, 2023: Follow-up for myelodysplastic syndrome with excess blasts-1 (MDS-EB1) and anemia; ongoing management with Retacrit  injections every three weeks, recent blood transfusion on November 22, 2023, and bone marrow biopsy on December 04, 2023 confirming 7% blasts. Surveillance for breast cancer remains negative, patient continues Tamoxifen , and hypercalcemia is under evaluation with calcium  supplements discontinued. Patient reports fatigue, poor appetite, and weight loss; plan to monitor hemoglobin and consider Vidaza if needed.     ALLERGIES:  is allergic to fluorouracil, other, clavulanic acid, codeine, hydrocodone , and penicillins.  MEDICATIONS:  Current Outpatient Medications  Medication Sig Dispense Refill   albuterol  (VENTOLIN  HFA) 108 (90 Base) MCG/ACT inhaler Inhale 2 puffs into the lungs every 6 (six) hours as needed for wheezing or shortness of breath.     atorvastatin  (LIPITOR) 20 MG tablet Take 20 mg by mouth at bedtime.     Black Cohosh 540 MG CAPS Take 540 mg by mouth daily.      BREZTRI  AEROSPHERE 160-9-4.8 MCG/ACT AERO inhaler Inhale 2 puffs into the lungs 2 (two) times daily as needed (for flares).     Calcium  Alginate-Silver (DYNAGINATE AG SILVER CAL 4X5 EX) Apply topically See admin instructions. Cut to size and apply to wound on right gluteal cheek with dressing change once a day     cefdinir  (OMNICEF ) 300 MG capsule Take 1 capsule (300 mg total) by mouth 2 (two) times daily. 7 capsule 0   denosumab (PROLIA) 60 MG/ML SOSY injection Inject 60 mg into the skin every 6 (six) months.  famotidine  (PEPCID ) 40 MG tablet Take 0.5 tablets (20 mg total) by mouth daily as needed for heartburn or indigestion.     fosfomycin (MONUROL ) 3 g PACK MIX AND STIR 1 PACKET INTO 3-4 OZ OF WATER AND DRINK EVERY 10 DAYS AS DIRECTED. TAKE IMMEDIATELY AFTER  DISSOLVING (Patient taking differently: Take 3 g by mouth See admin instructions. MIX AND STIR 3 GRAMS (1 PACKET) INTO 3-4 OZ OF WATER AND DRINK BY MOUTH EVERY 10 DAYS AS DIRECTED. DRINK. IMMEDIATELY AFTER DISSOLVING.) 9 g 3   Glucosamine-Chondroit-Vit C-Mn (GLUCOSAMINE-CHONDROITIN MAX ST) CAPS Take 1 capsule by mouth daily.      HYDROcodone -acetaminophen  (NORCO/VICODIN) 5-325 MG tablet Take 1-2 tablets by mouth every 6 (six) hours as needed for moderate pain (pain score 4-6) or severe pain (pain score 7-10). 40 tablet 0   [Paused] KERENDIA  10 MG TABS Take 10 mg by mouth in the morning.     linezolid  (ZYVOX ) 600 MG tablet Take 1 tablet (600 mg total) by mouth 2 (two) times daily. 7 tablet 0   loratadine  (CLARITIN ) 10 MG tablet Take 10 mg by mouth daily as needed for allergies or rhinitis.     megestrol  (MEGACE  ES) 625 MG/5ML suspension Take 5 mLs (625 mg total) by mouth daily. 150 mL 0   multivitamin (RENA-VIT) TABS tablet Take 1 tablet by mouth daily.     mupirocin  ointment (BACTROBAN ) 2 % Place 1 Application into the nose 2 (two) times daily.     pregabalin  (LYRICA ) 50 MG capsule Take 100 mg by mouth at bedtime.     Probiotic Product (PROBIOTIC ADVANCED PO) Take 1 capsule by mouth daily.      solifenacin  (VESICARE ) 10 MG tablet Take 1 tablet (10 mg total) by mouth every evening. 90 tablet 3   tamoxifen  (NOLVADEX ) 20 MG tablet TAKE 1 TABLET EVERY DAY 90 tablet 3   TYLENOL  8 HOUR ARTHRITIS PAIN 650 MG CR tablet Take 1,300 mg by mouth daily as needed for pain.     cyclobenzaprine  (FLEXERIL ) 5 MG tablet Take 1 tablet (5 mg total) by mouth 3 (three) times daily as needed for muscle spasms. (Patient not taking: Reported on 01/30/2024)     docusate sodium  (COLACE) 100 MG capsule Take 1 capsule (100 mg total) by mouth 2 (two) times daily. (Patient not taking: Reported on 01/30/2024)     lidocaine  (LIDODERM ) 5 % APPLY 2 PATCHES TO SKIN EVERY DAY, REMOVE AND REPLACE PATCH AFTER 12 HRS (Patient not taking:  Reported on 01/30/2024) 180 patch 0   [Paused] NIFEdipine  (PROCARDIA  XL/ADALAT -CC) 90 MG 24 hr tablet Take 90 mg by mouth See admin instructions. Take 90 mg by mouth in the morning and HOLD FOR A LOW B/P (Patient not taking: Reported on 01/30/2024)     ondansetron  (ZOFRAN ) 4 MG tablet Take 1 tablet (4 mg total) by mouth every 6 (six) hours as needed for nausea or vomiting. (Patient not taking: Reported on 01/30/2024)     No current facility-administered medications for this visit.    PHYSICAL EXAMINATION: ECOG PERFORMANCE STATUS: 1 - Symptomatic but completely ambulatory  There were no vitals filed for this visit. There were no vitals filed for this visit.  Physical Exam   (exam performed in the presence of a chaperone)  LABORATORY DATA:  I have reviewed the data as listed    Latest Ref Rng & Units 01/09/2024   10:35 AM 01/08/2024    7:11 AM 01/06/2024    7:27 AM  CMP  Glucose  70 - 99 mg/dL 894  88  891   BUN 8 - 23 mg/dL 15  23  45   Creatinine 0.44 - 1.00 mg/dL 8.57  8.60  7.91   Sodium 135 - 145 mmol/L 138  141  139   Potassium 3.5 - 5.1 mmol/L 4.2  4.4  4.6   Chloride 98 - 111 mmol/L 107  111  108   CO2 22 - 32 mmol/L 23  21  21    Calcium  8.9 - 10.3 mg/dL 7.7  7.7  8.0   Total Protein 6.5 - 8.1 g/dL 5.7  5.3    Total Bilirubin 0.0 - 1.2 mg/dL 0.3  0.3    Alkaline Phos 38 - 126 U/L 41  38    AST 15 - 41 U/L 17  16    ALT 0 - 44 U/L <5  <5      Lab Results  Component Value Date   WBC 2.9 (L) 01/30/2024   HGB 7.8 (L) 01/30/2024   HCT 23.8 (L) 01/30/2024   MCV 102.6 (H) 01/30/2024   PLT 142 (L) 01/30/2024   NEUTROABS 0.8 (L) 01/30/2024    ASSESSMENT & PLAN:  Malignant neoplasm of central portion of left breast in female, estrogen receptor positive (HCC) 04/07/2017:left breast lumpectomy with sentinel lymph node sampling  pT1b pN0, stage IA invasive ductal carcinoma, grade II, with negative margins, total 1 lymph node removed from the axilla, ER 100%, PR 70%, HER2  negative ratio 1.39, Ki-67 2% adjuvant radiation 05/10/2017 - 06/06/2017   Current treatment: Tamoxifen  started 03/17/2017 Plan of treatment: 10 years (patient preference)   Breast cancer surveillance: 1.  Breast MRI 11/11/2019: No evidence of malignancy  2. mammogram 06/02/2023: Benign breast density category C 3.  Breast exam 12/11/2023: Benign, radiation changes 4.  CT CAP 11/26/2023: No evidence of metastatic disease  MDS (myelodysplastic syndrome), high grade (HCC) Hospitalization 11/02/2023-11/03/2023: Closed C2 fracture Severe anemia: Previously 2 units of PRBC was given in the hospital B12: 895. Bone marrow biopsy: 12/04/2023: Flow cytometry: 7% abnormal blast population (positive for CD34, CD38, CD33, CD56, CD117 and HLA-DR), mildly hypercellular bone marrow 30% with trilineage hematopoiesis and no increase in plasma cells.   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  Worsening cytopenias: recommend appt with Dr. Onesimo or Dr.Dorsey to discuss her options (I briefly mentioned Vidaza and Venetoclax) Prognosis discussion: Discussed that her FISH panel suggests poor prognosis and hence she would benefit from treatment. 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 Declining hemoglobin and white blood cell count. Molecular analysis shows ASXL1, TET2, TP53, and U2AF1 mutations, indicating aggressive disease and poor prognosis. Increased fatigue noted. Retacrit  ineffective. Chemotherapy and stem cell transplant contraindicated. Goals: transfusion avoidance, life  prolongation, infection prevention. - Coordinated blood transfusion for symptomatic anemia, to be administered today or within 1-2 days based on availability and her preference. - Held Retacrit  injection on the same day as transfusion; if transfusion is not available today, administered Retacrit  and scheduled transfusion for the following day. - Referred urgently to hematology (Dr. Onesimo or Dr. Federico) for further management and drug selection, including consideration of azacitidine (Vidaza) or venetoclax . -  Discussed risks of disease progression, including high risk of transformation to acute myeloid leukemia and poor prognosis associated with her mutation profile. - Provided printed copy of bone marrow mutation analysis report.      No orders of the defined types were placed in this encounter.  The patient has a good understanding of the overall plan. she agrees with it. she will call with any problems that may develop before the next visit here.  I personally spent a total of 45 minutes in the care of the patient today including preparing to see the patient, getting/reviewing separately obtained history, performing a medically appropriate exam/evaluation, counseling and educating, placing orders, referring and communicating with other health care professionals, documenting clinical information in the EHR, independently interpreting results, communicating results, and coordinating care.   Viinay K Rubin Dais, MD 01/30/24

## 2024-01-30 NOTE — Progress Notes (Signed)
 Per MD request orders placed for pt to receive 1 unit of PRBC's today with hgb 7.8.

## 2024-01-30 NOTE — Assessment & Plan Note (Signed)
 04/07/2017:left breast lumpectomy with sentinel lymph node sampling  pT1b pN0, stage IA invasive ductal carcinoma, grade II, with negative margins, total 1 lymph node removed from the axilla, ER 100%, PR 70%, HER2 negative ratio 1.39, Ki-67 2% adjuvant radiation 05/10/2017 - 06/06/2017   Current treatment: Tamoxifen  started 03/17/2017 Plan of treatment: 10 years (patient preference)   Breast cancer surveillance: 1.  Breast MRI 11/11/2019: No evidence of malignancy  2. mammogram 06/02/2023: Benign breast density category C 3.  Breast exam 12/11/2023: Benign, radiation changes 4.  CT CAP 11/26/2023: No evidence of metastatic disease

## 2024-01-31 LAB — TYPE AND SCREEN
ABO/RH(D): O POS
Antibody Screen: NEGATIVE
Unit division: 0

## 2024-01-31 LAB — BPAM RBC
Blood Product Expiration Date: 202601042359
ISSUE DATE / TIME: 202512091255
Unit Type and Rh: 5100

## 2024-02-01 ENCOUNTER — Encounter: Payer: Self-pay | Admitting: Hematology and Oncology

## 2024-02-08 ENCOUNTER — Inpatient Hospital Stay

## 2024-02-08 ENCOUNTER — Inpatient Hospital Stay: Admitting: Hematology

## 2024-02-08 VITALS — BP 115/73 | HR 74 | Temp 97.2°F | Resp 18 | Wt 112.5 lb

## 2024-02-08 DIAGNOSIS — D649 Anemia, unspecified: Secondary | ICD-10-CM

## 2024-02-08 DIAGNOSIS — D46Z Other myelodysplastic syndromes: Secondary | ICD-10-CM

## 2024-02-08 DIAGNOSIS — C50112 Malignant neoplasm of central portion of left female breast: Secondary | ICD-10-CM | POA: Diagnosis not present

## 2024-02-08 LAB — CMP (CANCER CENTER ONLY)
ALT: <5 U/L (ref 0–44)
AST: 15 U/L (ref 15–41)
Albumin: 4.1 g/dL (ref 3.5–5.0)
Alkaline Phosphatase: 43 U/L (ref 38–126)
Anion gap: 11 (ref 5–15)
BUN: 36 mg/dL — ABNORMAL HIGH (ref 8–23)
CO2: 21 mmol/L — ABNORMAL LOW (ref 22–32)
Calcium: 9.4 mg/dL (ref 8.9–10.3)
Chloride: 106 mmol/L (ref 98–111)
Creatinine: 1.72 mg/dL — ABNORMAL HIGH (ref 0.44–1.00)
GFR, Estimated: 29 mL/min — ABNORMAL LOW (ref >60)
Glucose, Bld: 91 mg/dL (ref 70–99)
Potassium: 6.2 mmol/L — ABNORMAL HIGH (ref 3.5–5.1)
Sodium: 138 mmol/L (ref 135–145)
Total Bilirubin: 0.6 mg/dL (ref 0.0–1.2)
Total Protein: 7 g/dL (ref 6.5–8.1)

## 2024-02-08 LAB — CBC WITH DIFFERENTIAL (CANCER CENTER ONLY)
Abs Immature Granulocytes: 0.02 K/uL (ref 0.00–0.07)
Basophils Absolute: 0.1 K/uL (ref 0.0–0.1)
Basophils Relative: 2 %
Eosinophils Absolute: 0.1 K/uL (ref 0.0–0.5)
Eosinophils Relative: 2 %
HCT: 27.9 % — ABNORMAL LOW (ref 36.0–46.0)
Hemoglobin: 9.2 g/dL — ABNORMAL LOW (ref 12.0–15.0)
Immature Granulocytes: 0 %
Lymphocytes Relative: 42 %
Lymphs Abs: 1.9 K/uL (ref 0.7–4.0)
MCH: 33.3 pg (ref 26.0–34.0)
MCHC: 33 g/dL (ref 30.0–36.0)
MCV: 101.1 fL — ABNORMAL HIGH (ref 80.0–100.0)
Monocytes Absolute: 0.9 K/uL (ref 0.1–1.0)
Monocytes Relative: 20 %
Neutro Abs: 1.5 K/uL — ABNORMAL LOW (ref 1.7–7.7)
Neutrophils Relative %: 34 %
Platelet Count: 86 K/uL — ABNORMAL LOW (ref 150–400)
RBC: 2.76 MIL/uL — ABNORMAL LOW (ref 3.87–5.11)
RDW: 21.1 % — ABNORMAL HIGH (ref 11.5–15.5)
WBC Count: 4.5 K/uL (ref 4.0–10.5)
nRBC: 0 % (ref 0.0–0.2)

## 2024-02-08 LAB — SAMPLE TO BLOOD BANK

## 2024-02-08 LAB — LACTATE DEHYDROGENASE: LDH: 221 U/L (ref 105–235)

## 2024-02-08 NOTE — Progress Notes (Signed)
 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  Follow-up for continued evaluation and management of  estrogen receptor positive breast cancer.  HISTORY OF PRESENTING ILLNESS: (03/17/2017 initial establishment with Dr. Layla Inocente FORBES Franchot had routine screening mammography on 02/27/2017 showing a possible asymmetry in the left breast. She underwent unilateral diagnostic mammography with tomography and left breast ultrasonography at [facility] on 03/03/2017 showing: breast density category C. Small left breast mass in the 3:30 o'clock lower outer position 5 cm from the nipple measuring 0.8 x 0.5 x 0.7 cm is suspicious for breast malignancy. The left axilla is negative for lymphadenopathy.    Accordingly on 03/08/2016 she proceeded to biopsy of the left breast mass in question. The pathology from this procedure showed ([id number]): invasive mammary carcinoma. 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 Prognostic indicators significant for: estrogen receptor, 100% positive and progesterone receptor, 70% positive, both with strong staining intensity. Proliferation marker Ki67 at 2%. HER2 not amplified with ratio HER2/CEP17 signals 1.39 and average HER2 copies per cell 1.95.   Serenna was evaluated in the breast cancer clinic on 03/17/2017 accompanied by her husband Zell and her daughter Sueanne.. Her case was also presented at the multidisciplinary breast cancer conference on 03/15/2017. At that time a preliminary plan was proposed: Breast conserving surgery with sentinel lymph node sampling, consideration of Oncotype versus simply opting for antiestrogens;  consideration of adjuvant radiation in addition to antiestrogens  SUMMARY OF ONCOLOGIC HISTORY  04/07/2017:left breast lumpectomy with sentinel lymph node sampling  pT1b pN0, stage IA invasive ductal carcinoma, grade II, with negative margins, total 1 lymph node removed from the axilla, ER 100%, PR 70%, HER2 negative ratio 1.39, Ki-67 2% adjuvant radiation 05/10/2017 - 06/06/2017  Oncology History  Malignant neoplasm of central portion of left breast in female, estrogen receptor positive (HCC)  03/08/2017 Initial Diagnosis   Central left breast biopsy for a clinical T1b N0, stage IA i IDC, grade 1, e ER/PR positive, HER-2 not amplified, with an Ki-67 of 2%   03/16/2017 Cancer Staging   Staging form: Breast, AJCC 8th Edition - Clinical stage from 03/16/2017: Stage IA (cT1b, cN0, cM0, G1, ER+, PR+, HER2-) - Signed by Odean Potts, MD on 04/19/2022 Method of lymph node assessment: Clinical Histologic grading system: 3 grade system Laterality: Left Tumor size (mm): 8   04/07/2017 Surgery   left breast lumpectomy with sentinel lymph node sampling  pT1b pN0, stage IA invasive ductal carcinoma, grade II, with negative margins, total 1 lymph node removed from the axilla    Current treatment: Tamoxifen  started 03/17/2017 Plan of treatment: 10 years (patient preference)  INTERVAL HISTORY: Stacie Templin is a 83 y.o. female who is here today for continued evaluation and management of  newly diagnosed MDS with EB1 presenting as refractrory anemia and pancytopenia. SHe is accompanied by daughter, son-in-law, and husband.  she was last seen by Dr. Odean on 01/30/2024; at the time she mentioned experiencing fatigue and decreased stamina.  Today, she's had three Retacrit  infusions thus far.  She's noticed that recently her diastolic blood pressure has been low with associated symptoms of dizziness. She is reportedly eating better.  Denies abdominal pain, infection issues, bleeding issues, or current  lightheaded/dizziness.  REVIEW OF SYSTEMS:   10 Point review of systems of done and is negative except as noted above.  MEDICAL HISTORY Past Medical History:  Diagnosis Date   Age-related osteoporosis without current pathological fracture 10/31/2019   Allergic rhinitis 07/31/2007   Overview:   S/p allergy shots, 20 years ago Dr. Mariea     10/1 IMO update   Anemia    Aortic atherosclerosis 08/16/2022   Basal cell carcinoma 12/15/2014   Overview:   Managed by Dr. Tricia, derm   [facility]cer [facility]) 01/2017   left breast cancer, basal cell and 1 melanoma   Cervical spondylosis 07/13/2022   Closed compression fracture of L1 lumbar vertebra, with routine healing, subsequent encounter 11/26/2013   Overview:   Normal DEXA 2015     Last Assessment & Plan:   New order for DEXA written today. Patient is getting epidural steroid injections with pain management  Overview:   Overview:   Normal DEXA 2015     Last Assessment & Plan:   continued improvement in pain, will refill tramadol  and robaxin today.  Will return to PCP for follow-up   Complication of anesthesia    heart rate spikeed in the PACU on her 2nd knee surgery   Environmental allergies    Essential hypertension 09/19/2008   Last Assessment & Plan:   Blood pressure is elevated today. I reviewed her last few pain management notes her blood pressure was normal. Patient is asymptomatic and I suspect today's blood pressure is an outlier. She will continue home monitoring at her offices and will return for further evaluation if remains elevated   GAD (generalized anxiety disorder) 04/25/2017   Last Assessment & Plan:   See a/p above   High cholesterol    History of UTI 05/25/2022   History of vertebral compression fracture 07/16/2019   Hypercalcemia 11/22/2023   Hyperlipidemia 04/22/2009   Last Assessment & Plan:   Compliant with statin, continue   IBS (irritable bowel syndrome) 08/18/2006   Overview:   with IBS        Last Assessment &  Plan:   Refill Bentyl for when necessary use. No evidence of infectious or inflammatory, or malignancy symptoms. Advised to follow-up for further evaluation if more frequent or new symptoms   Insomnia 02/02/2010   Last Assessment & Plan:   Refilled Ambien  quantity #30 for the next calendar year. She denies excessive sedation and is aware to not drive when taking medication.   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  Will start Lexapro today.  We discussed interim use of benzodiazepines for pre-procedural or severe episodes.  She reports she has had panic attacks in the past but has not had them recently.  She never took diazepam that was previously prescribed for a procedure.  We discussed   Malignant neoplasm of central portion of left breast in female, estrogen receptor positive (HCC) 03/16/2017   Mixed dyslipidemia 03/22/2017   Mixed hyperlipidemia 03/22/2017   OAB (overactive bladder) 02/2020   Osteoarthritis    Osteoarthritis of spine with radiculopathy, lumbar region 02/27/2015   Osteopenia 03/03/2017   Overview:   DEXA 02/2017   Personal history of radiation therapy    Pleuritic chest pain 04/10/2018   PONV (postoperative nausea and vomiting)  with 1st knee surgery had n/v, none since   Pre-operative cardiovascular examination 03/22/2017   Raynaud disease    Raynauds disease-takes procardia    Raynaud's disease 08/18/2006   Rheumatoid arthritis (HCC)    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 Signa -- [place], [place]   Urge incontinence 05/25/2022    SURGICAL HISTORY Past Surgical History:  Procedure Laterality Date   ABDOMINAL HYSTERECTOMY  1972   APPENDECTOMY  1953   BREAST BIOPSY Left 05/19/2020   benign   BREAST LUMPECTOMY Left 2019   BREAST LUMPECTOMY WITH RADIOACTIVE SEED AND SENTINEL LYMPH NODE BIOPSY Left 04/07/2017   Procedure: BREAST LUMPECTOMY WITH RADIOACTIVE SEED AND SENTINEL LYMPH NODE BIOPSY;  Surgeon: Vanderbilt Ned, MD;  Location: [facility] OR;  Service: General;  Laterality: Left;   BUNIONECTOMY  1999   COLONOSCOPY     EYE SURGERY     bil cataract   GANGLION CYST EXCISION  1968   JOINT REPLACEMENT Left 2017    reverse shoulder replacement   POSTERIOR CERVICAL FUSION/FORAMINOTOMY N/A 11/02/2023   Procedure: POSTERIOR CERVICAL FUSION/FORAMINOTOMY CERVICAL ONE-TWO;  Surgeon: Mavis Purchase, MD;  Location: [facility] OR;  Service: Neurosurgery;  Laterality: N/A;   REPLACEMENT TOTAL KNEE  2012, 2013   Right and Left   SHOULDER ARTHROSCOPY Left    SYMPATHECTOMY  1970   TONSILLECTOMY  1949    SOCIAL HISTORY Social History[1]  Social History   Social History Narrative   Not on file    SOCIAL DRIVERS OF HEALTH SDOH Screenings   Food Insecurity: No Food Insecurity (01/05/2024)  Housing: Low Risk (01/05/2024)  Transportation Needs: No Transportation Needs (01/05/2024)  Utilities: Not At Risk (01/05/2024)  Depression (PHQ2-9): Low Risk (01/30/2024)  Financial Resource Strain: Low Risk (05/03/2021)   Received from [facility]  Physical Activity: Insufficiently Active (05/03/2021)   Received from [facility]  Social Connections: Socially  Integrated (01/05/2024)  Stress: No Stress Concern Present (05/03/2021)   Received from [facility]  Tobacco Use: Medium Risk (01/08/2024)     FAMILY HISTORY Family History  Adopted: Yes  Problem Relation Age of Onset   Breast cancer Neg Hx      ALLERGIES: is allergic to fluorouracil, other, clavulanic acid, codeine, hydrocodone , and penicillins.  MEDICATIONS  Current Outpatient Medications  Medication Sig Dispense Refill   albuterol  (VENTOLIN  HFA) 108 (90 Base) MCG/ACT inhaler Inhale 2 puffs into the lungs every 6 (six) hours as needed for wheezing or shortness of breath.     atorvastatin  (LIPITOR) 20 MG tablet Take 20 mg by mouth at bedtime.     Black Cohosh 540 MG CAPS Take 540 mg by mouth daily.      BREZTRI  AEROSPHERE 160-9-4.8 MCG/ACT AERO inhaler Inhale 2 puffs into the lungs 2 (two) times daily as needed (for flares).     Calcium  Alginate-Silver (DYNAGINATE AG SILVER CAL 4X5 EX) Apply topically See admin instructions. Cut to size and apply to  wound on right gluteal cheek with dressing change once a day     cefdinir  (OMNICEF ) 300 MG capsule Take 1 capsule (300 mg total) by mouth 2 (two) times daily. 7 capsule 0   cyclobenzaprine  (FLEXERIL ) 5 MG tablet Take 1 tablet (5 mg total) by mouth 3 (three) times daily as needed for muscle spasms. (Patient not taking: Reported on 01/30/2024)     denosumab (PROLIA) 60 MG/ML SOSY injection Inject 60 mg into the skin every 6 (six) months.     docusate sodium  (COLACE) 100 MG capsule Take 1 capsule (100 mg total) by mouth 2 (two) times daily. (Patient not taking: Reported on 01/30/2024)     famotidine  (PEPCID ) 40 MG tablet Take 0.5 tablets (20 mg total) by mouth daily as needed for heartburn or indigestion.     fosfomycin  (MONUROL ) 3 g PACK MIX AND STIR 1 PACKET INTO 3-4 OZ OF WATER AND DRINK EVERY 10 DAYS AS DIRECTED. TAKE IMMEDIATELY AFTER DISSOLVING (Patient taking differently: Take 3 g by mouth See admin instructions. MIX AND STIR 3 GRAMS  (1 PACKET) INTO 3-4 OZ OF WATER AND DRINK BY MOUTH EVERY 10 DAYS AS DIRECTED. DRINK. IMMEDIATELY AFTER DISSOLVING.) 9 g 3   Glucosamine-Chondroit-Vit C-Mn (GLUCOSAMINE-CHONDROITIN MAX ST) CAPS Take 1 capsule by mouth daily.      HYDROcodone -acetaminophen  (NORCO/VICODIN) 5-325 MG tablet Take 1-2 tablets by mouth every 6 (six) hours as needed for moderate pain (pain score 4-6) or severe pain (pain score 7-10). 40 tablet 0   [Paused] KERENDIA  10 MG TABS Take 10 mg by mouth in the morning.     lidocaine  (LIDODERM ) 5 % APPLY 2 PATCHES TO SKIN EVERY DAY, REMOVE AND REPLACE PATCH AFTER 12 HRS (Patient not taking: Reported on 01/30/2024) 180 patch 0   linezolid  (ZYVOX ) 600 MG tablet Take 1 tablet (600 mg total) by mouth 2 (two) times daily. 7 tablet 0   loratadine  (CLARITIN ) 10 MG tablet Take 10 mg by mouth daily as needed for allergies or rhinitis.     megestrol  (MEGACE  ES) 625 MG/5ML suspension Take 5 mLs (625 mg total) by mouth daily. 150 mL 0   multivitamin (RENA-VIT) TABS tablet Take 1 tablet by mouth daily.     mupirocin  ointment (BACTROBAN ) 2 % Place 1 Application into the nose 2 (two) times daily.     [Paused] NIFEdipine  (PROCARDIA  XL/ADALAT -CC) 90 MG 24 hr tablet Take 90 mg by mouth See admin instructions. Take 90 mg by mouth in the morning and HOLD FOR A LOW B/P (Patient not taking: Reported on 01/30/2024)     ondansetron  (ZOFRAN ) 4 MG tablet Take 1 tablet (4 mg total) by mouth every 6 (six) hours as needed for nausea or vomiting. (Patient not taking: Reported on 01/30/2024)     pregabalin  (LYRICA ) 50 MG capsule Take 100 mg by mouth at bedtime.     Probiotic Product (PROBIOTIC ADVANCED PO) Take 1 capsule by mouth daily.      solifenacin  (VESICARE ) 10 MG tablet Take 1 tablet (10 mg total) by mouth every evening. 90 tablet 3   tamoxifen  (NOLVADEX ) 20 MG tablet TAKE 1 TABLET EVERY DAY 90 tablet 3   TYLENOL  8 HOUR ARTHRITIS PAIN 650 MG CR tablet Take 1,300 mg by mouth daily as needed for pain.     No  current facility-administered medications for this visit.    PHYSICAL EXAMINATION: ECOG PERFORMANCE STATUS: 1 - Symptomatic but completely ambulatory VITALS: Vitals:   02/08/24 1355  BP: 115/73  Pulse: 74  Resp: 18  Temp: (!) 97.2 F (36.2 C)  SpO2: 97%   Filed Weights   02/08/24 1355  Weight: 112 lb 8 oz (51 kg)  Body mass index is 20.25 kg/m.  GENERAL: alert, in no acute distress and comfortable SKIN: no acute rashes, no significant lesions EYES: conjunctiva are pink and non-injected, sclera anicteric OROPHARYNX: MMM, no exudates, no oropharyngeal erythema or ulceration NECK: supple, no JVD LYMPH:  no palpable lymphadenopathy in the cervical, axillary or inguinal regions LUNGS: clear to auscultation b/l with normal respiratory effort HEART: regular rate & rhythm ABDOMEN:  normoactive bowel sounds , non tender, not distended, no hepatosplenomegaly Extremity: no pedal edema PSYCH: alert & oriented x 3 with fluent speech NEURO: no focal motor/sensory deficits  LABORATORY DATA:   I have reviewed the data as listed     Latest Ref Rng & Units 02/08/2024    3:31 PM 01/30/2024   10:51 AM 01/09/2024   10:35 AM  CBC EXTENDED  WBC 4.0 - 10.5 K/uL 4.5  2.9  4.3   RBC 3.87 - 5.11 MIL/uL 2.76  2.32  2.92   Hemoglobin 12.0 - 15.0 g/dL 9.2  7.8  9.8   HCT 63.9 - 46.0 % 27.9  23.8  30.6   Platelets 150 - 400 K/uL 86  142  71   NEUT# 1.7 - 7.7 K/uL 1.5  0.8    Lymph# 0.7 - 4.0 K/uL 1.9  1.4        Latest Ref Rng & Units 02/08/2024    3:31 PM 01/30/2024   10:51 AM 01/09/2024   10:35 AM  CMP  Glucose 70 - 99 mg/dL 91  898  894   BUN 8 - 23 mg/dL 36  22  15   Creatinine 0.44 - 1.00 mg/dL 8.27  8.52  8.57   Sodium 135 - 145 mmol/L 138  139  138   Potassium 3.5 - 5.1 mmol/L 6.2  5.2  4.2   Chloride 98 - 111 mmol/L 106  107  107   CO2 22 - 32 mmol/L 21  21  23    Calcium  8.9 - 10.3 mg/dL 9.4  8.6  7.7   Total Protein 6.5 - 8.1 g/dL 7.0  6.7  5.7   Total Bilirubin 0.0 - 1.2  mg/dL 0.6  0.5  0.3   Alkaline Phos 38 - 126 U/L 43  38  41   AST 15 - 41 U/L 15  14  17    ALT 0 - 44 U/L <5  <5  <5    12/20/2023    12/13/2023    12/04/2023 Surgical Pathology CASE: [id number] PATIENT: Orpha Sallie Flow Pathology Report  Clinical history: myeloma   DIAGNOSIS:  - Abnormal blast population identified.  See comment.  COMMENT: Flow cytometric analysis identified an abnormal blast population constituting 7% of all cells analyzed.  The cells are positive for CD34, CD33, CD38, CD56, CD117 and HLA-DR.  Please refer to case W [id number] for additional details.   GATING AND PHENOTYPIC ANALYSIS:  Gated population: Flow cytometric immunophenotyping is performed using antibodies to the antigens listed in the table below. Electronic gates are placed around a cell cluster displaying light scatter properties corresponding to: blasts  Abnormal Cells in sample: 7%  Phenotype of Abnormal Cells: CD33, CD34, CD38, CD56, CD117, HLA-Dr                      Lymphoid Antigens  Myeloid Antigens Miscellaneous CD2  NEG  CD10 NEG  CD11b     NEG  CD45 POS CD3  NEG  CD19 NEG  CD11c     ND   HLA-Dr    POS CD4  NEG  CD20 NEG  CD13 NEG  CD34 POS CD5  NEG  CD22 ND   CD14 NEG  CD38 POS CD7  NEG  CD79b     ND   CD15 NEG  CD138     ND CD8  NEG  CD103     ND   CD16 NEG  TdT  ND CD25 ND   CD200     NEG  CD33 POS  CD123     NEG TCRab     ND   sKappa    NEG  CD64 NEG  CD41 ND TCRgd     NEG  sLambda   NEG  CD117     POS  CD61 ND CD56 POS  cKappa    ND   MPO  NEG  CD71 ND CD57 ND   cLambda   ND        [id number]   12/04/2023 Surgical Pathology CASE: [id number] PATIENT: Khloe Scalia Bone Marrow Report  Clinical History: Severe anemia eval  DIAGNOSIS:  BONE MARROW, ASPIRATE, CLOT, CORE: - Mildly hypercellular bone marrow (30%) with trilineage hematopoiesis and no increase in plasma cells.  See comment.  PERIPHERAL BLOOD: - Macrocytic anemia,  thrombocytopenia  COMMENT: Morphologic evaluation of the bone marrow reveals a mildly hypercellular bone marrow.  Plasma cells are not increased (5% by manual aspirate differential and approximately 5% by CD138 immunohistochemical analysis).  Kappa/lambda ISH are polyclonal.  While mild dyspoiesis is identified, no significant dysplasia is identified in any of the lineages.  Occasional lymphoid aggregates comprised of small mature lymphocytes are identified in the clot section.  Immunohistochemical stains CD3 and CD20 are pending for further characterization.  Secondary causes for the patient's peripheral blood findings may be considered. Correlation with pending cytogenetics is recommended for further assessment.  A myeloid NGS panel may be requested if clinically indicated.  MICROSCOPIC DESCRIPTION:  PERIPHERAL BLOOD SMEAR: Platelets: Mildly decreased, no platelet clumps identified Erythroid: Macrocytosis Leukocytes: Adequate, occasional hypolobated neutrophils present, negative for blasts   BONE MARROW ASPIRATE: Cellular Erythroid precursors: Erythroid precursors show a full sequence of maturation with occasional dyspoietic forms including budding and nuclear irregularities Granulocytic precursors: Granulocytic precursors show a full sequence of maturation, occasional hypolobated neutrophils present Megakaryocytes: Typical in number and morphology with occasional hypolobated forms Lymphocytes/plasma cells: Not increased  TOUCH PREPARATIONS: Cellular, confirmatory of aspirate findings  CLOT AND BIOPSY: The bone marrow clot and biopsy reveal a mildly hypercellular bone marrow (30%) with trilineage hematopoiesis.  No significant dysplasia is identified in any of the lineages.  Blasts are not increased.  Plasma cells do not appear increased.  The findings are confirmatory of the aspirate and touch prep impression.  SPECIAL STAINS: CD138: CD138 highlights plasma cells  approximately 5% of cellularity Kappa/lambda ISH: Kappa/lambda ISH are polyclonal IRON STAIN: Iron stains are performed on a bone marrow aspirate or touch imprint smear and section of clot. The controls stained appropriately.       Storage Iron: Adequate      Ring Sideroblasts: Not identified  ADDITIONAL DATA/TESTING: Cytogenetics  CELL COUNT DATA:  Bone Marrow count performed on 500 cells shows: Blasts:   0%   Myeloid:  44% Promyelocytes: 0%   Erythroid:     42% Myelocytes:  6%   Lymphocytes:   9% Metamyelocytes:     2%   Plasma cells:  5% Bands:    1% Neutrophils:   30%  M:E ratio:     1.05 Eosinophils:   5% Basophils:     0% Monocytes:     0%  Lab Data: CBC performed on 12/04/2023 shows: WBC: 4.5 k/uL  Neutrophils:   50% Hgb: 8.4 g/dL  Lymphocytes:   60% HCT: 25.9 %    Monocytes:     7% MCV: 104 fL    Eosinophils:   4% RDW: 19.1 %    Basophils:     0% PLT: 141 k/uL    RADIOGRAPHIC STUDIES: I have personally reviewed the radiological images as listed and agreed with the findings in the report. DG Chest Port 1 View Result Date: 01/05/2024 EXAM: 1 VIEW(S) XRAY OF THE CHEST 01/05/2024 02:09:00 PM COMPARISON: None available. CLINICAL HISTORY: Questionable sepsis - evaluate for abnormality FINDINGS: LUNGS AND PLEURA: Hazy opacity at left lung base, likely atelectasis. No pleural effusion. No pneumothorax. HEART AND MEDIASTINUM: Aortic atherosclerosis. No acute abnormality of the cardiac and mediastinal silhouettes. BONES AND SOFT TISSUES: Surgical clips in left breast. Degenerative changes of right shoulder. Left reverse total shoulder arthroplasty noted. IMPRESSION: 1. Hazy opacity at the left lung base, likely atelectasis. 2. Aortic Atherosclerosis (ICD10-I70.0). Electronically signed by: Ryan Chess MD 01/05/2024 02:33 PM EST RP Workstation: [id number]   US  Venous Img Lower  Left (DVT Study) Result Date: 12/24/2023 CLINICAL DATA:  Pain EXAM: LEFT LOWER EXTREMITY VENOUS  DOPPLER ULTRASOUND TECHNIQUE: Gray-scale sonography with compression, as well as color and duplex ultrasound, were performed to evaluate the deep venous system(s) from the level of the common femoral vein through the popliteal and proximal calf veins. COMPARISON:  None Available. FINDINGS: VENOUS Normal compressibility of the common femoral, superficial femoral, and popliteal veins, as well as the visualized calf veins. Visualized portions of profunda femoral vein and great saphenous vein unremarkable. No filling defects to suggest DVT on grayscale or color Doppler imaging. Doppler waveforms show normal direction of venous flow, normal respiratory plasticity and response to augmentation. Limited views of the contralateral common femoral vein are unremarkable. OTHER None. Limitations: none IMPRESSION: 1. No evidence of deep venous thrombosis within the left lower extremity. Electronically Signed   By: Ozell Daring M.D.   On: 12/24/2023 18:46   DG Foot Complete Left Result Date: 12/24/2023 EXAM: 3 OR MORE VIEW(S) XRAY OF THE LEFT FOOT 12/24/2023 05:46:00 PM COMPARISON: None available. CLINICAL HISTORY: [id number] Pain [id number] Pain FINDINGS: BONES AND JOINTS: No acute fracture. Surgical screw is seen in proximal first metatarsal. Surgical screw was also seen in distal second metatarsal. Moderate degenerative change is seen involving first metatarsophalangeal joint. No joint dislocation. SOFT TISSUES: Vascular calcifications are noted. IMPRESSION: 1. Moderate degenerative change involving the first metatarsophalangeal joint. 2. Surgical screws in the proximal first metatarsal and distal second metatarsal. 3. Vascular calcifications. Electronically signed by: Lynwood Seip MD 12/24/2023 06:00 PM EST RP Workstation: [id number]   CT CHEST ABDOMEN PELVIS WO CONTRAST Result Date: 11/26/2023 CLINICAL DATA:  Breast cancer staging, evaluate for metastatic disease * Tracking Code: BO * EXAM: CT CHEST, ABDOMEN AND PELVIS WITHOUT  CONTRAST TECHNIQUE: Multidetector CT imaging of the chest, abdomen and pelvis was performed following the standard protocol without IV contrast. RADIATION DOSE REDUCTION: This exam was performed according to the departmental dose-optimization program which includes automated exposure control, adjustment of the mA and/or kV according to patient size and/or  use of iterative reconstruction technique. COMPARISON:  09/22/2023 04/20/2022 FINDINGS: CT CHEST FINDINGS Cardiovascular: Aortic atherosclerosis. Normal heart size. Three-vessel coronary artery calcifications. Enlargement of the main pulmonary artery measuring up to 3.5 cm in caliber. No pericardial effusion. Mediastinum/Nodes: No enlarged mediastinal, hilar, or axillary lymph nodes. Thyroid  gland, trachea, and esophagus demonstrate no significant findings. 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 Bland appearing scarring of the bilateral lung bases. Unchanged, benign 0.4 cm subpleural nodule of the dependent left lower lobe (series 6, image 95). No pleural effusion or pneumothorax. Musculoskeletal: Postoperative findings of left lumpectomy with associated seroma, unchanged (series 2, image 37). No acute osseous findings. Left shoulder reverse arthroplasty. CT ABDOMEN PELVIS FINDINGS Hepatobiliary: No solid liver abnormality is seen. No gallstones, gallbladder wall thickening, or biliary dilatation. Pancreas: Unremarkable. No pancreatic ductal dilatation or surrounding inflammatory changes. Spleen: Normal in size without significant abnormality. Adrenals/Urinary Tract: Adrenal glands are unremarkable. Kidneys are normal, without renal calculi, solid lesion, or hydronephrosis. Bladder is unremarkable. Stomach/Bowel: Stomach is within normal limits. Appendix not clearly visualized. No evidence of bowel wall thickening, distention, or inflammatory changes. Sigmoid diverticulosis. Vascular/Lymphatic: Aortic atherosclerosis. No enlarged  abdominal or pelvic lymph nodes. Reproductive: Hysterectomy. Other: No abdominal wall hernia or abnormality. No ascites. Musculoskeletal: No acute osseous findings. IMPRESSION: 1. No noncontrast evidence of lymphadenopathy or metastatic disease in the chest, abdomen, or pelvis. 2. Postoperative findings of left lumpectomy with associated seroma, unchanged. 3. 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 4. Enlargement of the main pulmonary artery, as can be seen in pulmonary hypertension. 5. Coronary artery disease. Aortic Atherosclerosis (ICD10-I70.0). Electronically Signed   By: Marolyn JONETTA Jaksch M.D.   On: 11/26/2023 06:59    ASSESSMENT & PLAN:  83 y.o. female with  1) Malignant neoplasm of central portion of left breast in female, estrogen receptor positive (HCC) 04/07/2017:left breast lumpectomy with sentinel lymph node sampling  pT1b pN0, stage IA invasive ductal carcinoma, grade II, with negative margins, total 1 lymph node removed from the axilla, ER 100%, PR 70%, HER2 negative ratio 1.39, Ki-67 2% adjuvant radiation 05/10/2017 - 06/06/2017   Current treatment: Tamoxifen  started 03/17/2017 Plan of treatment: 10 years (patient preference)   Breast cancer surveillance: 1.  Breast MRI 11/11/2019: No evidence of malignancy  2. mammogram 06/02/2023: Benign breast density category C 3.  Breast exam 12/11/2023: Benign, radiation changes 4.  CT CAP 11/26/2023: No evidence of metastatic disease   2) MDS (myelodysplastic syndrome), high grade (HCC) Hospitalization 11/02/2023-11/03/2023: Closed C2 fracture Severe anemia: Previously 2 units of PRBC was given in the hospital B12: 895. Bone marrow biopsy: 12/04/2023: Flow cytometry: 7% abnormal blast population (positive for CD34, CD38, CD33, CD56, CD117 and HLA-DR), mildly hypercellular bone marrow 30% with trilineage hematopoiesis and no increase in plasma cells.   Severe hypercalcemia: Zometa  infusion given 11/22/2023 CT CAP 11/26/2023: No  evidence of malignancy PTH 12 (range is 15-65)   Diagnosis: MDS: RAEB 1 (refractory anemia with excess blasts  PLAN: - Reviewed lab results on 01/30/2024: CBC showed WBC of 2.9K decreased from 4.3K, Hemoglobin of 7.8 decreased from 9.8, and PLTs of 142K increased from 71K.   Despite retacrit  infusions, her hemoglobin continues to drop.  - Reviewed 12/04/2023 surgical pathology:   - Mildly hypercellular bone marrow (30%) with trilineage hematopoiesis and no increase in plasma cells.  Plasma cells are not increased (5% by manual aspirate differential and approximately 5% by CD138 immunohistochemical analysis).  Kappa/lambda ISH are polyclonal.  While mild dyspoiesis is  identified, no significant dysplasia is identified in any of the lineages.  Occasional lymphoid aggregates comprised of small mature lymphocytes are identified in the clot section.   -flow cytomety showed 7% myeloid blasts. Myeloid NGS shows ASXL1, TET2, TP53, and U2AF1 mutations - which would be considered high risk mutations. - given cytopenias and clonal findings and increased blasts this would be consistent with high risk MDS  with EB1 (though dysplastic changes werent very prominent) - Explained mechanism of MDS and concerns for risk of progressive BM failure and risk of  acute leukemia.   - Discussed treatment options, explaining that these would not be curative. The only curative treatment would be a bone barrow transplant and chemotherapy, but at her age this is not a valid option.   Explained risks and burden of involvement with each option. -her CKD is also adding to her anemia. -Patient is agreeable with proceeding with Vidaza. We discussed that this could worsen her cytopenia before improvement and we wi ll need to monitor her labs closely/weekly for the first 2 months. -port a cath placement for IV Vidaza 5 days/28 days  3) HYperkalemia -low potassium diet -maintain PO fluid intake of 64oz daily -rpt potassium  levels in 1 wee -will need toi hold Kerendia - likely cause   FOLLOW-UP  Weekly labs x 10 with PRBC transfusion as needed for hgb<7 Chemo-counseling for Vidaza Port a cath placement by IR  Schedule to start Vidaza in about 2 weeks  The total time spent in the appointment was 45 minutes* .  All of the patient's questions were answered and the patient knows to call the clinic with any problems, questions, or concerns.  Emaline Saran MD MS AAHIVMS [facility] Hematology/Oncology Physician [facility]  *Total Encounter Time as defined by the Centers for Medicare and Medicaid Services includes, in addition to the face-to-face time of a patient visit (documented in the note above) non-face-to-face time: obtaining and reviewing outside history, ordering and reviewing medications, tests or procedures, care coordination (communications with other health care professionals or caregivers) and documentation in the medical record.  I,Emily Lagle,acting as a neurosurgeon for Emaline Saran, MD.,have documented all relevant documentation on the behalf of Emaline Saran, MD,as directed by  Emaline Saran, MD while in the presence of Emaline Saran, MD.  I have reviewed the above documentation for accuracy and completeness, and I agree with the above.  Emaline Saran, MD     [1]  Social History Tobacco Use   Smoking status: Former    Current packs/day: 0.00    Types: Cigarettes    Quit date: 02/22/1963    Years since quitting: 61.0    Passive exposure: Past   Smokeless tobacco: Never  Vaping Use   Vaping status: Never Used  Substance Use Topics   Alcohol use: Yes    Alcohol/week: 14.0 standard drinks of alcohol    Types: 14 Glasses of wine per week    Comment: 2 glasses of wine daily.    Drug use: No   "

## 2024-02-16 ENCOUNTER — Encounter: Payer: Self-pay | Admitting: Hematology

## 2024-02-16 ENCOUNTER — Encounter: Payer: Self-pay | Admitting: Hematology and Oncology

## 2024-02-16 NOTE — Progress Notes (Signed)
START ON PATHWAY REGIMEN - MDS     A cycle is every 28 days:     Azacitidine   **Always confirm dose/schedule in your pharmacy ordering system**  Patient Characteristics: Higher-Risk, First Line, Not a Transplant Candidate Did cytogenetic and molecular analysis reveal an isolated del(5q) or del(5q) with one other cytogenetic abnormality except monosomy 7 or 7q deletion with no concomitant TP53 mutations<= No Line of therapy: First Line Patient Characteristics: Not a Transplant Candidate Intent of Therapy: Non-Curative / Palliative Intent, Discussed with Patient 

## 2024-02-17 ENCOUNTER — Other Ambulatory Visit: Payer: Self-pay

## 2024-02-19 ENCOUNTER — Telehealth: Payer: Self-pay | Admitting: Physician Assistant

## 2024-02-19 ENCOUNTER — Encounter: Payer: Self-pay | Admitting: Hematology

## 2024-02-19 ENCOUNTER — Inpatient Hospital Stay

## 2024-02-19 ENCOUNTER — Other Ambulatory Visit: Payer: Self-pay

## 2024-02-19 ENCOUNTER — Telehealth: Payer: Self-pay

## 2024-02-19 DIAGNOSIS — D46Z Other myelodysplastic syndromes: Secondary | ICD-10-CM

## 2024-02-19 DIAGNOSIS — C50112 Malignant neoplasm of central portion of left female breast: Secondary | ICD-10-CM | POA: Diagnosis not present

## 2024-02-19 DIAGNOSIS — D649 Anemia, unspecified: Secondary | ICD-10-CM

## 2024-02-19 LAB — CMP (CANCER CENTER ONLY)
ALT: <5 U/L (ref 0–44)
AST: 15 U/L (ref 15–41)
Albumin: 4.2 g/dL (ref 3.5–5.0)
Alkaline Phosphatase: 41 U/L (ref 38–126)
Anion gap: 12 (ref 5–15)
BUN: 28 mg/dL — ABNORMAL HIGH (ref 8–23)
CO2: 21 mmol/L — ABNORMAL LOW (ref 22–32)
Calcium: 8.7 mg/dL — ABNORMAL LOW (ref 8.9–10.3)
Chloride: 108 mmol/L (ref 98–111)
Creatinine: 1.44 mg/dL — ABNORMAL HIGH (ref 0.44–1.00)
GFR, Estimated: 36 mL/min — ABNORMAL LOW
Glucose, Bld: 158 mg/dL — ABNORMAL HIGH (ref 70–99)
Potassium: 5.1 mmol/L (ref 3.5–5.1)
Sodium: 141 mmol/L (ref 135–145)
Total Bilirubin: 0.5 mg/dL (ref 0.0–1.2)
Total Protein: 7.6 g/dL (ref 6.5–8.1)

## 2024-02-19 LAB — CBC WITH DIFFERENTIAL (CANCER CENTER ONLY)
Abs Immature Granulocytes: 0.12 K/uL — ABNORMAL HIGH (ref 0.00–0.07)
Basophils Absolute: 0.1 K/uL (ref 0.0–0.1)
Basophils Relative: 2 %
Eosinophils Absolute: 0.2 K/uL (ref 0.0–0.5)
Eosinophils Relative: 3 %
HCT: 24.2 % — ABNORMAL LOW (ref 36.0–46.0)
Hemoglobin: 8 g/dL — ABNORMAL LOW (ref 12.0–15.0)
Immature Granulocytes: 2 %
Lymphocytes Relative: 35 %
Lymphs Abs: 2.1 K/uL (ref 0.7–4.0)
MCH: 34 pg (ref 26.0–34.0)
MCHC: 33.1 g/dL (ref 30.0–36.0)
MCV: 103 fL — ABNORMAL HIGH (ref 80.0–100.0)
Monocytes Absolute: 0.9 K/uL (ref 0.1–1.0)
Monocytes Relative: 15 %
Neutro Abs: 2.7 K/uL (ref 1.7–7.7)
Neutrophils Relative %: 43 %
Platelet Count: 130 K/uL — ABNORMAL LOW (ref 150–400)
RBC: 2.35 MIL/uL — ABNORMAL LOW (ref 3.87–5.11)
RDW: 20.7 % — ABNORMAL HIGH (ref 11.5–15.5)
WBC Count: 6.1 K/uL (ref 4.0–10.5)
nRBC: 0 % (ref 0.0–0.2)

## 2024-02-19 LAB — SAMPLE TO BLOOD BANK

## 2024-02-19 NOTE — Telephone Encounter (Signed)
 Pt came in today for lab only appt, per Dr Maryla request, to follow up on her elevated potassium. K+ today dropped from 6.2 to 5.1.  Per Johnston pt to f/up with her Dr that prescribes the Kerendia , daughter advised and agreed. Pt prefers to start Vidaza after her port placement on 03/04/24.  Johnston has requested weekly labs until she starts Vidaza. Scheduling/Abby has been advised and will contact daughter with appt information

## 2024-02-19 NOTE — Telephone Encounter (Signed)
 Patients daughter called back to confirm the information I gave in the voicemail. She is aware of the appointments. I let her know I will reach out again regarding the first treatment and the times.

## 2024-02-19 NOTE — Telephone Encounter (Signed)
 Scheduled patient for chemo education class and weekly labs. Called and left a voicemail for the daughter with the information.

## 2024-02-22 ENCOUNTER — Encounter: Payer: Self-pay | Admitting: Hematology

## 2024-02-26 ENCOUNTER — Inpatient Hospital Stay: Attending: Hematology and Oncology

## 2024-02-26 ENCOUNTER — Encounter: Payer: Self-pay | Admitting: Hematology

## 2024-02-26 DIAGNOSIS — E875 Hyperkalemia: Secondary | ICD-10-CM | POA: Diagnosis not present

## 2024-02-26 DIAGNOSIS — D46Z Other myelodysplastic syndromes: Secondary | ICD-10-CM | POA: Insufficient documentation

## 2024-02-26 DIAGNOSIS — Z5111 Encounter for antineoplastic chemotherapy: Secondary | ICD-10-CM | POA: Insufficient documentation

## 2024-02-26 DIAGNOSIS — M810 Age-related osteoporosis without current pathological fracture: Secondary | ICD-10-CM | POA: Insufficient documentation

## 2024-02-26 DIAGNOSIS — C50112 Malignant neoplasm of central portion of left female breast: Secondary | ICD-10-CM | POA: Diagnosis present

## 2024-02-26 DIAGNOSIS — D649 Anemia, unspecified: Secondary | ICD-10-CM

## 2024-02-26 DIAGNOSIS — Z170 Estrogen receptor positive status [ER+]: Secondary | ICD-10-CM | POA: Insufficient documentation

## 2024-02-26 DIAGNOSIS — Z79810 Long term (current) use of selective estrogen receptor modulators (SERMs): Secondary | ICD-10-CM | POA: Diagnosis not present

## 2024-02-26 DIAGNOSIS — Z79899 Other long term (current) drug therapy: Secondary | ICD-10-CM | POA: Insufficient documentation

## 2024-02-26 LAB — CBC WITH DIFFERENTIAL (CANCER CENTER ONLY)
Abs Immature Granulocytes: 0.06 K/uL (ref 0.00–0.07)
Basophils Absolute: 0.1 K/uL (ref 0.0–0.1)
Basophils Relative: 2 %
Eosinophils Absolute: 0.1 K/uL (ref 0.0–0.5)
Eosinophils Relative: 3 %
HCT: 23.6 % — ABNORMAL LOW (ref 36.0–46.0)
Hemoglobin: 7.9 g/dL — ABNORMAL LOW (ref 12.0–15.0)
Immature Granulocytes: 1 %
Lymphocytes Relative: 37 %
Lymphs Abs: 1.9 K/uL (ref 0.7–4.0)
MCH: 34.2 pg — ABNORMAL HIGH (ref 26.0–34.0)
MCHC: 33.5 g/dL (ref 30.0–36.0)
MCV: 102.2 fL — ABNORMAL HIGH (ref 80.0–100.0)
Monocytes Absolute: 1 K/uL (ref 0.1–1.0)
Monocytes Relative: 19 %
Neutro Abs: 2 K/uL (ref 1.7–7.7)
Neutrophils Relative %: 38 %
Platelet Count: 137 K/uL — ABNORMAL LOW (ref 150–400)
RBC: 2.31 MIL/uL — ABNORMAL LOW (ref 3.87–5.11)
RDW: 21 % — ABNORMAL HIGH (ref 11.5–15.5)
WBC Count: 5.2 K/uL (ref 4.0–10.5)
nRBC: 0 % (ref 0.0–0.2)

## 2024-02-26 LAB — CMP (CANCER CENTER ONLY)
ALT: <5 U/L (ref 0–44)
AST: 14 U/L — ABNORMAL LOW (ref 15–41)
Albumin: 4.3 g/dL (ref 3.5–5.0)
Alkaline Phosphatase: 45 U/L (ref 38–126)
Anion gap: 12 (ref 5–15)
BUN: 21 mg/dL (ref 8–23)
CO2: 21 mmol/L — ABNORMAL LOW (ref 22–32)
Calcium: 8.9 mg/dL (ref 8.9–10.3)
Chloride: 109 mmol/L (ref 98–111)
Creatinine: 1.25 mg/dL — ABNORMAL HIGH (ref 0.44–1.00)
GFR, Estimated: 42 mL/min — ABNORMAL LOW
Glucose, Bld: 105 mg/dL — ABNORMAL HIGH (ref 70–99)
Potassium: 5 mmol/L (ref 3.5–5.1)
Sodium: 142 mmol/L (ref 135–145)
Total Bilirubin: 0.5 mg/dL (ref 0.0–1.2)
Total Protein: 7.4 g/dL (ref 6.5–8.1)

## 2024-02-26 LAB — SAMPLE TO BLOOD BANK

## 2024-02-27 ENCOUNTER — Other Ambulatory Visit: Payer: Self-pay | Admitting: Hematology

## 2024-02-27 DIAGNOSIS — D46Z Other myelodysplastic syndromes: Secondary | ICD-10-CM

## 2024-02-27 MED ORDER — LIDOCAINE-PRILOCAINE 2.5-2.5 % EX CREA: TOPICAL_CREAM | CUTANEOUS | 3 refills | Status: AC

## 2024-02-27 MED ORDER — PROCHLORPERAZINE MALEATE 10 MG PO TABS: 10.0000 mg | ORAL_TABLET | Freq: Four times a day (QID) | ORAL | 1 refills | Status: AC | PRN

## 2024-02-29 ENCOUNTER — Encounter: Payer: Self-pay | Admitting: Hematology

## 2024-02-29 ENCOUNTER — Inpatient Hospital Stay

## 2024-03-01 ENCOUNTER — Other Ambulatory Visit: Payer: Self-pay | Admitting: Radiology

## 2024-03-01 NOTE — H&P (Signed)
 "  Chief Complaint: Newly diagnosed high grade MDS with RAEB1 with pancytopenia; referred for Port-A-Cath placement to assist with treatment/Vidaza  Referring Provider(s): Kale,G  Supervising Physician: Hughes Simmonds  Patient Status: [facility] - Out-pt  History of Present Illness: Heidi Lutz is an 84 y.o. female with past medical history significant for osteoporosis, anemia, skin cancer, left breast cancer, L1 compression fracture, hypertension, anxiety, hyperlipidemia, left bundle branch block, spinal stenosis, depression, IBS, Raynaud's disease, rheumatoid arthritis who presents now with newly diagnosed high-grade MDS with RAEB1 with pancytopenia.  She presents today for Port-A-Cath placement to assist with Vidaza administration.    Patient is Full Code  Past Medical History:  Diagnosis Date   Age-related osteoporosis without current pathological fracture 10/31/2019   Allergic rhinitis 07/31/2007   Overview:   S/p allergy shots, 20 years ago Dr. Mariea     10/1 IMO update   Anemia    Aortic atherosclerosis 08/16/2022   Basal cell carcinoma 12/15/2014   Overview:   Managed by Dr. Tricia, derm   Cancer [facility]) 01/2017   left breast cancer, basal cell and 1 melanoma   Cervical spondylosis 07/13/2022   Closed compression fracture of L1 lumbar vertebra, with routine healing, subsequent encounter 11/26/2013   Overview:   Normal DEXA 2015     Last Assessment & Plan:   New order for DEXA written today. Patient is getting epidural steroid injections with pain management  Overview:   Overview:   Normal DEXA 2015     Last Assessment & Plan:   continued improvement in pain, will refill tramadol  and robaxin today.  Will return to PCP for follow-up   Complication of anesthesia    heart rate spikeed in the PACU on her 2nd knee surgery   Environmental allergies    Essential hypertension 09/19/2008   Last Assessment & Plan:   Blood pressure is elevated today. I reviewed her last few pain  management notes her blood pressure was normal. Patient is asymptomatic and I suspect today's blood pressure is an outlier. She will continue home monitoring at her offices and will return for further evaluation if remains elevated   GAD (generalized anxiety disorder) 04/25/2017   Last Assessment & Plan:   See a/p above   High cholesterol    History of UTI 05/25/2022   History of vertebral compression fracture 07/16/2019   Hypercalcemia 11/22/2023   Hyperlipidemia 04/22/2009   Last Assessment & Plan:   Compliant with statin, continue   IBS (irritable bowel syndrome) 08/18/2006   Overview:   with IBS        Last Assessment & Plan:   Refill Bentyl for when necessary use. No evidence of infectious or inflammatory, or malignancy symptoms. Advised to follow-up for further evaluation if more frequent or new symptoms   Insomnia 02/02/2010   Last Assessment & Plan:   Refilled Ambien  quantity #30 for the next calendar year. She denies excessive sedation and is aware to not drive when taking medication.   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  Will start Lexapro today.  We discussed interim use of benzodiazepines for pre-procedural or severe episodes.  She reports she has had panic attacks in the past but has not had them recently.  She never took diazepam that was  previously prescribed for a procedure.  We discussed   Malignant neoplasm of central portion of left breast in female, estrogen receptor positive (HCC) 03/16/2017   Mixed dyslipidemia 03/22/2017   Mixed hyperlipidemia 03/22/2017   OAB (overactive bladder) 02/2020   Osteoarthritis    Osteoarthritis of spine with radiculopathy, lumbar region 02/27/2015   Osteopenia 03/03/2017   Overview:   DEXA 02/2017    Personal history of radiation therapy    Pleuritic chest pain 04/10/2018   PONV (postoperative nausea and vomiting)    with 1st knee surgery had n/v, none since   Pre-operative cardiovascular examination 03/22/2017   Raynaud disease    Raynauds disease-takes procardia    Raynaud's disease 08/18/2006   Rheumatoid arthritis (HCC)    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 Signa -- [place], [place]   Urge incontinence 05/25/2022    Past Surgical History:  Procedure Laterality Date   ABDOMINAL HYSTERECTOMY  1972   APPENDECTOMY  1953   BREAST BIOPSY Left 05/19/2020   benign   BREAST LUMPECTOMY Left 2019   BREAST LUMPECTOMY WITH RADIOACTIVE SEED AND SENTINEL LYMPH NODE BIOPSY Left 04/07/2017   Procedure: BREAST LUMPECTOMY WITH RADIOACTIVE SEED AND SENTINEL LYMPH NODE BIOPSY;  Surgeon: Vanderbilt Ned, MD;  Location: [facility] OR;  Service: General;  Laterality: Left;   BUNIONECTOMY  1999   COLONOSCOPY     EYE SURGERY     bil cataract   GANGLION CYST EXCISION  1968   JOINT REPLACEMENT Left 2017    reverse shoulder replacement   POSTERIOR CERVICAL FUSION/FORAMINOTOMY N/A 11/02/2023   Procedure: POSTERIOR CERVICAL FUSION/FORAMINOTOMY CERVICAL ONE-TWO;  Surgeon: Mavis Purchase, MD;  Location: [facility] OR;  Service: Neurosurgery;  Laterality: N/A;   REPLACEMENT TOTAL KNEE  2012, 2013   Right and Left   SHOULDER ARTHROSCOPY Left     SYMPATHECTOMY  1970   TONSILLECTOMY  1949    Allergies: Fluorouracil, Other, Clavulanic acid, Codeine, Hydrocodone , and Penicillins  Medications: Prior to Admission medications  Medication Sig Start Date End Date Taking? Authorizing Provider  albuterol  (VENTOLIN  HFA) 108 (90 Base) MCG/ACT inhaler Inhale 2 puffs into the lungs every 6 (six) hours as needed for wheezing or shortness of breath.    [provider]  atorvastatin  (LIPITOR) 20 MG tablet Take 20 mg by mouth at bedtime.    [provider]  Black Cohosh 540 MG CAPS Take 540 mg by mouth daily.     [provider]  BREZTRI  AEROSPHERE 160-9-4.8 MCG/ACT AERO inhaler Inhale 2 puffs into the lungs 2 (two) times daily as needed (for flares). 05/15/23   [provider]  Calcium  Alginate-Silver (DYNAGINATE AG SILVER CAL 4X5 EX) Apply topically See admin instructions. Cut to size and apply to wound on right gluteal cheek with dressing change once a day    [provider]  cefdinir  (OMNICEF ) 300 MG capsule Take 1 capsule (300 mg total) by mouth 2 (two) times daily.  01/09/24   Danford, Lonni SQUIBB, MD  cyclobenzaprine  (FLEXERIL ) 5 MG tablet Take 1 tablet (5 mg total) by mouth 3 (three) times daily as needed for muscle spasms. Patient not taking: Reported on 01/30/2024 11/03/23   Bergman, Meghan D, NP  denosumab (PROLIA) 60 MG/ML SOSY injection Inject 60 mg into the skin every 6 (six) months.    [provider]  docusate sodium  (COLACE) 100 MG capsule Take 1 capsule (100 mg total) by mouth 2 (two) times daily. Patient not taking: Reported on 01/30/2024 11/03/23   Bergman, Meghan D, NP  famotidine  (PEPCID ) 40 MG tablet Take 0.5 tablets (20 mg total) by mouth daily as needed for heartburn or indigestion. 10/22/23   Rashid, Farhan, MD  fosfomycin  (MONUROL ) 3 g PACK MIX AND STIR 1 PACKET INTO 3-4 OZ OF WATER AND DRINK EVERY 10 DAYS AS DIRECTED. TAKE IMMEDIATELY AFTER DISSOLVING Patient taking  differently: Take 3 g by mouth See admin instructions. MIX AND STIR 3 GRAMS (1 PACKET) INTO 3-4 OZ OF WATER AND DRINK BY MOUTH EVERY 10 DAYS AS DIRECTED. DRINK. IMMEDIATELY AFTER DISSOLVING. 12/27/23   Stoneking, Adine PARAS., MD  Glucosamine-Chondroit-Vit C-Mn (GLUCOSAMINE-CHONDROITIN MAX ST) CAPS Take 1 capsule by mouth daily.     [provider]  HYDROcodone -acetaminophen  (NORCO/VICODIN) 5-325 MG tablet Take 1-2 tablets by mouth every 6 (six) hours as needed for moderate pain (pain score 4-6) or severe pain (pain score 7-10). 11/03/23   Bergman, Meghan D, NP  [Paused] KERENDIA  10 MG TABS Take 10 mg by mouth in the morning. Wait to take this until your doctor or other care provider tells you to start again.    [provider]  lidocaine  (LIDODERM ) 5 % APPLY 2 PATCHES TO SKIN EVERY DAY, REMOVE AND REPLACE PATCH AFTER 12 HRS Patient not taking: Reported on 01/30/2024 08/13/20   Lorilee Sven SQUIBB, MD  lidocaine -prilocaine  (EMLA ) cream Apply to affected area once 02/27/24   Onesimo Emaline Brink, MD  linezolid  (ZYVOX ) 600 MG tablet Take 1 tablet (600 mg total) by mouth 2 (two) times daily. 01/09/24   Danford, Lonni SQUIBB, MD  loratadine  (CLARITIN ) 10 MG tablet Take 10 mg by mouth daily as needed for allergies or rhinitis.    [provider]  megestrol  (MEGACE  ES) 625 MG/5ML suspension Take 5 mLs (625 mg total) by mouth daily. 12/11/23   Gudena, Vinay, MD  multivitamin (RENA-VIT) TABS tablet Take 1 tablet by mouth daily. 05/01/23   [provider]  mupirocin  ointment (BACTROBAN ) 2 % Place 1 Application into the nose 2 (two) times daily. 11/03/23   Bergman, Meghan D, NP  [Paused] NIFEdipine  (PROCARDIA  XL/ADALAT -CC) 90 MG 24 hr tablet Take 90 mg by mouth See admin instructions. Take 90 mg by mouth in the morning and HOLD FOR A LOW B/P Patient not taking: Reported on 01/30/2024 Wait to take this until your doctor or other care provider tells you to start again.    [provider]  ondansetron  (ZOFRAN ) 4 MG tablet Take 1 tablet (4 mg total) by mouth every 6 (six) hours as needed for nausea or vomiting. Patient not taking: Reported on 01/30/2024 11/03/23   Bergman, Meghan D, NP  pregabalin  (LYRICA ) 50 MG capsule Take 100 mg by mouth at bedtime.    [provider]  Probiotic Product (PROBIOTIC ADVANCED PO) Take 1 capsule by mouth daily.     [provider]  prochlorperazine  (COMPAZINE ) 10 MG tablet Take 1 tablet (10 mg total) by mouth every 6 (six)  hours as needed for nausea or vomiting. 02/27/24   Onesimo Emaline Brink, MD  solifenacin  (VESICARE ) 10 MG tablet Take 1 tablet (10 mg total) by mouth every evening. 05/10/23   Stoneking, Adine PARAS., MD  tamoxifen  (NOLVADEX ) 20 MG tablet TAKE 1 TABLET EVERY DAY 08/18/23   Odean Potts, MD  TYLENOL  8 HOUR ARTHRITIS PAIN 650 MG CR tablet Take 1,300 mg by mouth daily as needed for pain.    [provider]     Family History  Adopted: Yes  Problem Relation Age of Onset   Breast cancer Neg Hx     Social History   Socioeconomic History   Marital status: Married    Spouse name: Not on file   Number of children: Not on file   Years of education: Not on file   Highest education level: Not on file  Occupational History   Not on file  Tobacco Use   Smoking status: Former    Current packs/day: 0.00    Types: Cigarettes    Quit date: 02/22/1963    Years since quitting: 61.0    Passive exposure: Past   Smokeless tobacco: Never  Vaping Use   Vaping status: Never Used  Substance and Sexual Activity   Alcohol use: Yes    Alcohol/week: 14.0 standard drinks of alcohol    Types: 14 Glasses of wine per week    Comment: 2 glasses of wine daily.    Drug use: No   Sexual activity: Not on file  Other Topics Concern   Not on file  Social History Narrative   Not on file   Social Drivers of Health   Tobacco Use: Medium Risk (01/08/2024)   Patient History    Smoking Tobacco Use: Former     Smokeless Tobacco Use: Never    Passive Exposure: Past  Physicist, Medical Strain: Low Risk (05/03/2021)   Received from [facility]   Overall Financial Resource Strain (CARDIA)    Difficulty of Paying Living Expenses: Not hard at all  Food Insecurity: No Food Insecurity (01/05/2024)   Epic    Worried About Programme Researcher, Broadcasting/film/video in the Last Year: Never true    Ran Out of Food in the Last Year: Never true  Transportation Needs: No Transportation Needs (01/05/2024)   Epic    Lack of Transportation (Medical): No    Lack of Transportation (Non-Medical): No  Physical Activity: Insufficiently Active (05/03/2021)   Received from [facility]   Exercise Vital Sign    On average, how many days per week do you engage in moderate to strenuous exercise (like a brisk walk)?: 3 days    On average, how many minutes do you engage in exercise at this level?: 40 min  Stress: No Stress Concern Present (05/03/2021)   Received from [facility] - Occupational Stress Questionnaire    Feeling of Stress : Not at all  Social Connections: Socially Integrated (01/05/2024)   Social Connection and Isolation Panel    Frequency of Communication with Friends and Family: More than three times a week    Frequency of Social Gatherings with Friends and Family: Twice a week    Attends Religious Services: More than 4 times per year    Active Member of Clubs or Organizations: Yes    Attends Banker Meetings: More than 4 times per year    Marital Status: Married  Depression (PHQ2-9): Low Risk (01/30/2024)   Depression (PHQ2-9)  PHQ-2 Score: 0  Alcohol Screen: Not on file  Housing: Low Risk (01/05/2024)   Epic    Unable to Pay for Housing in the Last Year: No    Number of Times Moved in the Last Year: 0    Homeless in the Last Year: No  Utilities: Not At Risk (01/05/2024)   Epic    Threatened with loss of utilities: No  Health Literacy: Not on file        Review of Systems denies fever,HA,CP,dyspnea, cough, abd pain,N/V or bleeding; she does have joint pain  Vital Signs: Vitals:   03/04/24 1207  BP: 115/74  Pulse: 90  Resp: 16  Temp: 98.3 F (36.8 C)  SpO2: 99%    LMP  (LMP Unknown)   Advance Care Plan: No documents on file  Physical Exam: awake/alert; chest- CTA bilat; heart- RRR; abd-soft,+BS,NT; no LE edema  Imaging: No results found.  Labs:  CBC: Recent Labs    01/30/24 1051 02/08/24 1531 02/19/24 1415 02/26/24 1327  WBC 2.9* 4.5 6.1 5.2  HGB 7.8* 9.2* 8.0* 7.9*  HCT 23.8* 27.9* 24.2* 23.6*  PLT 142* 86* 130* 137*    COAGS: Recent Labs    01/05/24 1332  INR 1.7*    BMP: Recent Labs    01/30/24 1051 02/08/24 1531 02/19/24 1415 02/26/24 1327  NA 139 138 141 142  K 5.2* 6.2* 5.1 5.0  CL 107 106 108 109  CO2 21* 21* 21* 21*  GLUCOSE 101* 91 158* 105*  BUN 22 36* 28* 21  CALCIUM  8.6* 9.4 8.7* 8.9  CREATININE 1.47* 1.72* 1.44* 1.25*  GFRNONAA 35* 29* 36* 42*    LIVER FUNCTION TESTS: Recent Labs    01/30/24 1051 02/08/24 1531 02/19/24 1415 02/26/24 1327  BILITOT 0.5 0.6 0.5 0.5  AST 14* 15 15 14*  ALT <5 <5 <5 <5  ALKPHOS 38 43 41 45  PROT 6.7 7.0 7.6 7.4  ALBUMIN  3.8 4.1 4.2 4.3    TUMOR MARKERS: No results for input(s): AFPTM, CEA, CA199, CHROMGRNA in the last 8760 hours.  Assessment and Plan: 84 y.o. female with past medical history significant for osteoporosis, anemia, skin cancer, left breast cancer, L1 compression fracture, hypertension, anxiety, hyperlipidemia, left bundle branch block, spinal stenosis, depression, IBS, Raynaud's disease, rheumatoid arthritis who presents now with newly diagnosed high-grade MDS with RAEB1 with pancytopenia.  She presents today for Port-A-Cath placement to assist with Vidaza administration.Risks and benefits of image guided port-a-catheter placement was discussed with the patient/spouse including, but not limited to bleeding,  infection, pneumothorax, or fibrin sheath development and need for additional procedures.  All of the patient's questions were answered, patient is agreeable to proceed. Consent signed and in chart.    Thank you for allowing our service to participate in Heidi Lutz 's care.  Electronically Signed: D. Franky Rakers, PA-C   03/01/2024, 4:56 PM      I spent a total of 20 minute    in face to face in clinical consultation, greater than 50% of which was counseling/coordinating care for port a cath placement ii  "

## 2024-03-04 ENCOUNTER — Ambulatory Visit (HOSPITAL_COMMUNITY)
Admission: RE | Admit: 2024-03-04 | Discharge: 2024-03-04 | Disposition: A | Source: Ambulatory Visit | Attending: Hematology

## 2024-03-04 ENCOUNTER — Encounter (HOSPITAL_COMMUNITY): Payer: Self-pay

## 2024-03-04 ENCOUNTER — Inpatient Hospital Stay

## 2024-03-04 DIAGNOSIS — F32A Depression, unspecified: Secondary | ICD-10-CM | POA: Diagnosis not present

## 2024-03-04 DIAGNOSIS — D61818 Other pancytopenia: Secondary | ICD-10-CM | POA: Diagnosis not present

## 2024-03-04 DIAGNOSIS — I7300 Raynaud's syndrome without gangrene: Secondary | ICD-10-CM | POA: Diagnosis not present

## 2024-03-04 DIAGNOSIS — K589 Irritable bowel syndrome without diarrhea: Secondary | ICD-10-CM | POA: Insufficient documentation

## 2024-03-04 DIAGNOSIS — D46Z Other myelodysplastic syndromes: Secondary | ICD-10-CM | POA: Diagnosis present

## 2024-03-04 DIAGNOSIS — Z87891 Personal history of nicotine dependence: Secondary | ICD-10-CM | POA: Insufficient documentation

## 2024-03-04 DIAGNOSIS — Z853 Personal history of malignant neoplasm of breast: Secondary | ICD-10-CM | POA: Insufficient documentation

## 2024-03-04 DIAGNOSIS — D4621 Refractory anemia with excess of blasts 1: Secondary | ICD-10-CM | POA: Insufficient documentation

## 2024-03-04 DIAGNOSIS — I447 Left bundle-branch block, unspecified: Secondary | ICD-10-CM | POA: Diagnosis not present

## 2024-03-04 DIAGNOSIS — Z85828 Personal history of other malignant neoplasm of skin: Secondary | ICD-10-CM | POA: Diagnosis not present

## 2024-03-04 DIAGNOSIS — M8008XA Age-related osteoporosis with current pathological fracture, vertebra(e), initial encounter for fracture: Secondary | ICD-10-CM | POA: Insufficient documentation

## 2024-03-04 DIAGNOSIS — Z79899 Other long term (current) drug therapy: Secondary | ICD-10-CM | POA: Diagnosis not present

## 2024-03-04 DIAGNOSIS — M069 Rheumatoid arthritis, unspecified: Secondary | ICD-10-CM | POA: Insufficient documentation

## 2024-03-04 DIAGNOSIS — E785 Hyperlipidemia, unspecified: Secondary | ICD-10-CM | POA: Diagnosis not present

## 2024-03-04 DIAGNOSIS — I10 Essential (primary) hypertension: Secondary | ICD-10-CM | POA: Insufficient documentation

## 2024-03-04 DIAGNOSIS — D649 Anemia, unspecified: Secondary | ICD-10-CM

## 2024-03-04 HISTORY — PX: IR IMAGING GUIDED PORT INSERTION: IMG5740

## 2024-03-04 MED ORDER — MIDAZOLAM HCL 2 MG/2ML IJ SOLN
INTRAMUSCULAR | Status: AC
  Filled 2024-03-04: qty 2

## 2024-03-04 MED ORDER — FENTANYL CITRATE (PF) 100 MCG/2ML IJ SOLN
INTRAMUSCULAR | Status: AC | PRN
  Administered 2024-03-04 (×2): 50 ug via INTRAVENOUS

## 2024-03-04 MED ORDER — LIDOCAINE-EPINEPHRINE 1 %-1:100000 IJ SOLN
20.0000 mL | Freq: Once | INTRAMUSCULAR | Status: AC
  Administered 2024-03-04: 20 mL via INTRADERMAL

## 2024-03-04 MED ORDER — FENTANYL CITRATE (PF) 100 MCG/2ML IJ SOLN
INTRAMUSCULAR | Status: AC
  Filled 2024-03-04: qty 2

## 2024-03-04 MED ORDER — HEPARIN SOD (PORK) LOCK FLUSH 100 UNIT/ML IV SOLN
500.0000 [IU] | Freq: Once | INTRAVENOUS | Status: AC
  Administered 2024-03-04: 500 [IU] via INTRAVENOUS

## 2024-03-04 MED ORDER — SODIUM CHLORIDE 0.9 % IV SOLN: INTRAVENOUS | Status: DC

## 2024-03-04 MED ORDER — MIDAZOLAM HCL (PF) 2 MG/2ML IJ SOLN
INTRAMUSCULAR | Status: AC | PRN
  Administered 2024-03-04 (×2): 1 mg via INTRAVENOUS

## 2024-03-04 MED ORDER — LIDOCAINE-EPINEPHRINE 1 %-1:100000 IJ SOLN
INTRAMUSCULAR | Status: AC
  Filled 2024-03-04: qty 20

## 2024-03-04 MED ORDER — HEPARIN SOD (PORK) LOCK FLUSH 100 UNIT/ML IV SOLN
INTRAVENOUS | Status: AC
  Filled 2024-03-04: qty 5

## 2024-03-04 NOTE — Progress Notes (Signed)
 Pharmacist Chemotherapy Monitoring - Initial Assessment    Anticipated start date: 03/12/23   The following has been reviewed per standard work regarding the patient's treatment regimen: The patient's diagnosis, treatment plan and drug doses, and organ/hematologic function Lab orders and baseline tests specific to treatment regimen  The treatment plan start date, drug sequencing, and pre-medications Prior authorization status  Patient's documented medication list, including drug-drug interaction screen and prescriptions for anti-emetics and supportive care specific to the treatment regimen The drug concentrations, fluid compatibility, administration routes, and timing of the medications to be used The patient's access for treatment and lifetime cumulative dose history, if applicable  The patient's medication allergies and previous infusion related reactions, if applicable   Changes made to treatment plan:  treatment plan date  Follow up needed:  Port placement   Candy Glatter, PharmD, MBA

## 2024-03-04 NOTE — Procedures (Signed)
 Vascular and Interventional Radiology Procedure Note  Patient: Heidi Lutz DOB: [birth year] Medical Record Number: [id number] Note Date/Time: 03/04/2024 1:14 PM   Performing Physician: Thom Hall, MD Assistant(s): None  Diagnosis: MDS / Myeloma  Procedure: PORT PLACEMENT  Anesthesia: Conscious Sedation Complications: None Estimated Blood Loss: Minimal  Findings:  Successful right-sided port placement, with the tip of the catheter in the proximal right atrium. Extremely friable skin, suspect high risk for port erosion.  Plan: Catheter ready for use.  See detailed procedure note with images in PACS. The patient tolerated the procedure well without incident or complication and was returned to Recovery in stable condition.    Thom Hall, MD Vascular and Interventional Radiology [facility] Rad[facility]   Pager. [phone] Clinic. [phone]

## 2024-03-04 NOTE — Discharge Instructions (Signed)
 MAY CONTACT INTERVENTIONAL RADIOLOGY CLINIC AT [phone] WITH ANY QUESTIONS OR CONCERNS  MAY REMOVE DRESSING TOMORROW AND SHOWER

## 2024-03-06 ENCOUNTER — Inpatient Hospital Stay

## 2024-03-07 ENCOUNTER — Inpatient Hospital Stay

## 2024-03-07 DIAGNOSIS — D649 Anemia, unspecified: Secondary | ICD-10-CM

## 2024-03-07 DIAGNOSIS — D46Z Other myelodysplastic syndromes: Secondary | ICD-10-CM

## 2024-03-07 DIAGNOSIS — Z5111 Encounter for antineoplastic chemotherapy: Secondary | ICD-10-CM | POA: Diagnosis not present

## 2024-03-07 LAB — CMP (CANCER CENTER ONLY)
ALT: <5 U/L (ref 0–44)
AST: 16 U/L (ref 15–41)
Albumin: 4.2 g/dL (ref 3.5–5.0)
Alkaline Phosphatase: 44 U/L (ref 38–126)
Anion gap: 13 (ref 5–15)
BUN: 27 mg/dL — ABNORMAL HIGH (ref 8–23)
CO2: 21 mmol/L — ABNORMAL LOW (ref 22–32)
Calcium: 9.1 mg/dL (ref 8.9–10.3)
Chloride: 106 mmol/L (ref 98–111)
Creatinine: 1.5 mg/dL — ABNORMAL HIGH (ref 0.44–1.00)
GFR, Estimated: 34 mL/min — ABNORMAL LOW
Glucose, Bld: 127 mg/dL — ABNORMAL HIGH (ref 70–99)
Potassium: 5.6 mmol/L — ABNORMAL HIGH (ref 3.5–5.1)
Sodium: 140 mmol/L (ref 135–145)
Total Bilirubin: 0.5 mg/dL (ref 0.0–1.2)
Total Protein: 7.3 g/dL (ref 6.5–8.1)

## 2024-03-07 LAB — CBC WITH DIFFERENTIAL (CANCER CENTER ONLY)
Abs Immature Granulocytes: 0.07 K/uL (ref 0.00–0.07)
Basophils Absolute: 0.1 K/uL (ref 0.0–0.1)
Basophils Relative: 2 %
Eosinophils Absolute: 0.1 K/uL (ref 0.0–0.5)
Eosinophils Relative: 2 %
HCT: 21.5 % — ABNORMAL LOW (ref 36.0–46.0)
Hemoglobin: 7.2 g/dL — ABNORMAL LOW (ref 12.0–15.0)
Immature Granulocytes: 1 %
Lymphocytes Relative: 38 %
Lymphs Abs: 2.3 K/uL (ref 0.7–4.0)
MCH: 35 pg — ABNORMAL HIGH (ref 26.0–34.0)
MCHC: 33.5 g/dL (ref 30.0–36.0)
MCV: 104.4 fL — ABNORMAL HIGH (ref 80.0–100.0)
Monocytes Absolute: 1 K/uL (ref 0.1–1.0)
Monocytes Relative: 16 %
Neutro Abs: 2.4 K/uL (ref 1.7–7.7)
Neutrophils Relative %: 41 %
Platelet Count: 140 K/uL — ABNORMAL LOW (ref 150–400)
RBC: 2.06 MIL/uL — ABNORMAL LOW (ref 3.87–5.11)
RDW: 21.6 % — ABNORMAL HIGH (ref 11.5–15.5)
WBC Count: 5.9 K/uL (ref 4.0–10.5)
nRBC: 0 % (ref 0.0–0.2)

## 2024-03-07 LAB — SAMPLE TO BLOOD BANK

## 2024-03-11 ENCOUNTER — Other Ambulatory Visit: Payer: Self-pay

## 2024-03-11 ENCOUNTER — Other Ambulatory Visit: Payer: Self-pay | Admitting: Hematology

## 2024-03-11 ENCOUNTER — Inpatient Hospital Stay

## 2024-03-11 VITALS — BP 148/60 | HR 89 | Resp 16 | Wt 107.4 lb

## 2024-03-11 DIAGNOSIS — D46Z Other myelodysplastic syndromes: Secondary | ICD-10-CM

## 2024-03-11 DIAGNOSIS — Z5111 Encounter for antineoplastic chemotherapy: Secondary | ICD-10-CM | POA: Diagnosis not present

## 2024-03-11 DIAGNOSIS — D649 Anemia, unspecified: Secondary | ICD-10-CM

## 2024-03-11 LAB — CBC WITH DIFFERENTIAL (CANCER CENTER ONLY)
Abs Immature Granulocytes: 0.06 K/uL (ref 0.00–0.07)
Basophils Absolute: 0.1 K/uL (ref 0.0–0.1)
Basophils Relative: 1 %
Eosinophils Absolute: 0.1 K/uL (ref 0.0–0.5)
Eosinophils Relative: 2 %
HCT: 18.9 % — ABNORMAL LOW (ref 36.0–46.0)
Hemoglobin: 6.4 g/dL — CL (ref 12.0–15.0)
Immature Granulocytes: 1 %
Lymphocytes Relative: 33 %
Lymphs Abs: 1.8 K/uL (ref 0.7–4.0)
MCH: 35 pg — ABNORMAL HIGH (ref 26.0–34.0)
MCHC: 33.9 g/dL (ref 30.0–36.0)
MCV: 103.3 fL — ABNORMAL HIGH (ref 80.0–100.0)
Monocytes Absolute: 1 K/uL (ref 0.1–1.0)
Monocytes Relative: 19 %
Neutro Abs: 2.4 K/uL (ref 1.7–7.7)
Neutrophils Relative %: 44 %
Platelet Count: 113 K/uL — ABNORMAL LOW (ref 150–400)
RBC: 1.83 MIL/uL — ABNORMAL LOW (ref 3.87–5.11)
RDW: 21.5 % — ABNORMAL HIGH (ref 11.5–15.5)
WBC Count: 5.4 K/uL (ref 4.0–10.5)
nRBC: 0 % (ref 0.0–0.2)

## 2024-03-11 LAB — SAMPLE TO BLOOD BANK

## 2024-03-11 LAB — CMP (CANCER CENTER ONLY)
ALT: <5 U/L (ref 0–44)
AST: 14 U/L — ABNORMAL LOW (ref 15–41)
Albumin: 4.2 g/dL (ref 3.5–5.0)
Alkaline Phosphatase: 41 U/L (ref 38–126)
Anion gap: 11 (ref 5–15)
BUN: 25 mg/dL — ABNORMAL HIGH (ref 8–23)
CO2: 24 mmol/L (ref 22–32)
Calcium: 9 mg/dL (ref 8.9–10.3)
Chloride: 105 mmol/L (ref 98–111)
Creatinine: 1.43 mg/dL — ABNORMAL HIGH (ref 0.44–1.00)
GFR, Estimated: 36 mL/min — ABNORMAL LOW
Glucose, Bld: 101 mg/dL — ABNORMAL HIGH (ref 70–99)
Potassium: 4.6 mmol/L (ref 3.5–5.1)
Sodium: 140 mmol/L (ref 135–145)
Total Bilirubin: 0.6 mg/dL (ref 0.0–1.2)
Total Protein: 7.2 g/dL (ref 6.5–8.1)

## 2024-03-11 LAB — PREPARE RBC (CROSSMATCH)

## 2024-03-11 MED ORDER — SODIUM CHLORIDE 0.9 % IV SOLN: INTRAVENOUS | Status: DC

## 2024-03-11 MED ORDER — ONDANSETRON HCL 8 MG PO TABS: 8.0000 mg | ORAL_TABLET | Freq: Three times a day (TID) | ORAL | 1 refills | Status: AC | PRN

## 2024-03-11 MED ORDER — ONDANSETRON HCL 8 MG PO TABS
8.0000 mg | ORAL_TABLET | Freq: Once | ORAL | Status: AC
  Administered 2024-03-11: 8 mg via ORAL
  Filled 2024-03-11: qty 1

## 2024-03-11 MED ORDER — SODIUM CHLORIDE 0.9 % IV SOLN
75.0000 mg/m2 | Freq: Once | INTRAVENOUS | Status: AC
  Administered 2024-03-11: 113 mg via INTRAVENOUS
  Filled 2024-03-11: qty 11.3

## 2024-03-11 NOTE — Patient Instructions (Signed)
 [facility] - A DEPT OF [facility]  Discharge Instructions: Thank you for choosing [facility] to provide your oncology and hematology care.   If you have a lab appointment with the Cancer Center, please go directly to the Cancer Center and check in at the registration area.   Wear comfortable clothing and clothing appropriate for easy access to any Portacath or PICC line.   We strive to give you quality time with your provider. You may need to reschedule your appointment if you arrive late (15 or more minutes).  Arriving late affects you and other patients whose appointments are after yours.  Also, if you miss three or more appointments without notifying the office, you may be dismissed from the clinic at the provider's discretion.      For prescription refill requests, have your pharmacy contact our office and allow 72 hours for refills to be completed.    Today you received the following chemotherapy and/or immunotherapy agents vidaza       To help prevent nausea and vomiting after your treatment, we encourage you to take your nausea medication as directed.  BELOW ARE SYMPTOMS THAT SHOULD BE REPORTED IMMEDIATELY: *FEVER GREATER THAN 100.4 F (38 C) OR HIGHER *CHILLS OR SWEATING *NAUSEA AND VOMITING THAT IS NOT CONTROLLED WITH YOUR NAUSEA MEDICATION *UNUSUAL SHORTNESS OF BREATH *UNUSUAL BRUISING OR BLEEDING *URINARY PROBLEMS (pain or burning when urinating, or frequent urination) *BOWEL PROBLEMS (unusual diarrhea, constipation, pain near the anus) TENDERNESS IN MOUTH AND THROAT WITH OR WITHOUT PRESENCE OF ULCERS (sore throat, sores in mouth, or a toothache) UNUSUAL RASH, SWELLING OR PAIN  UNUSUAL VAGINAL DISCHARGE OR ITCHING   Items with * indicate a potential emergency and should be followed up as soon as possible or go to the Emergency Department if any problems should occur.  Please show the CHEMOTHERAPY ALERT CARD or IMMUNOTHERAPY  ALERT CARD at check-in to the Emergency Department and triage nurse.  Should you have questions after your visit or need to cancel or reschedule your appointment, please contact [facility] - A DEPT OF [facility]  Dept: [phone]  and follow the prompts.  Office hours are 8:00 a.m. to 4:30 p.m. Monday - Friday. Please note that voicemails left after 4:00 p.m. may not be returned until the following business day.  We are closed weekends and major holidays. You have access to a nurse at all times for urgent questions. Please call the main number to the clinic Dept: [phone] and follow the prompts.   For any non-urgent questions, you may also contact your provider using MyChart. We now offer e-Visits for anyone 14 and older to request care online for non-urgent symptoms. For details visit [email or web address].   Also download the MyChart app! Go to the app store, search MyChart, open the app, select [place], and log in with your MyChart username and password.

## 2024-03-12 ENCOUNTER — Inpatient Hospital Stay

## 2024-03-12 VITALS — BP 99/50 | HR 86 | Temp 98.1°F | Resp 18

## 2024-03-12 DIAGNOSIS — D46Z Other myelodysplastic syndromes: Secondary | ICD-10-CM

## 2024-03-12 DIAGNOSIS — Z5111 Encounter for antineoplastic chemotherapy: Secondary | ICD-10-CM | POA: Diagnosis not present

## 2024-03-12 MED ORDER — SODIUM CHLORIDE 0.9 % IV SOLN: INTRAVENOUS | Status: DC

## 2024-03-12 MED ORDER — SODIUM CHLORIDE 0.9 % IV SOLN
75.0000 mg/m2 | Freq: Once | INTRAVENOUS | Status: AC
  Administered 2024-03-12: 113 mg via INTRAVENOUS
  Filled 2024-03-12: qty 11.3

## 2024-03-12 MED ORDER — SODIUM CHLORIDE 0.9% IV SOLUTION
250.0000 mL | INTRAVENOUS | Status: DC
  Administered 2024-03-12: 100 mL via INTRAVENOUS

## 2024-03-12 MED ORDER — ONDANSETRON HCL 8 MG PO TABS
8.0000 mg | ORAL_TABLET | Freq: Once | ORAL | Status: AC
  Administered 2024-03-12: 8 mg via ORAL
  Filled 2024-03-12: qty 1

## 2024-03-12 MED ORDER — SODIUM CHLORIDE 0.9% FLUSH: 10.0000 mL | INTRAVENOUS | Status: DC | PRN

## 2024-03-12 MED ORDER — ACETAMINOPHEN 325 MG PO TABS
650.0000 mg | ORAL_TABLET | Freq: Once | ORAL | Status: AC
  Administered 2024-03-12: 650 mg via ORAL
  Filled 2024-03-12: qty 2

## 2024-03-12 MED ORDER — METHYLPREDNISOLONE SODIUM SUCC 125 MG IJ SOLR
40.0000 mg | Freq: Once | INTRAMUSCULAR | Status: AC
  Administered 2024-03-12: 40 mg via INTRAVENOUS
  Filled 2024-03-12: qty 2

## 2024-03-12 NOTE — Patient Instructions (Signed)
 Blood Transfusion, Adult A blood transfusion is a procedure in which you receive blood through an IV tube. You may need this procedure because of: A bleeding disorder. An illness. An injury. A surgery. The blood may come from someone else (a donor). You may also be able to donate blood for yourself before a surgery. The blood given in a transfusion may be made up of different types of cells. You may get: Red blood cells. These carry oxygen to the cells in the body. Platelets. These help your blood to clot. Plasma. This is the liquid part of your blood. It carries proteins and other substances through the body. White blood cells. These help you fight infections. If you have a clotting disorder, you may also get other types of blood products. Depending on the type of blood product, this procedure may take 1-4 hours to complete. Tell your doctor about: Any bleeding problems you have. Any reactions you have had during a blood transfusion in the past. Any allergies you have. All medicines you are taking, including vitamins, herbs, eye drops, creams, and over-the-counter medicines. Any surgeries you have had. Any medical conditions you have. Whether you are pregnant or may be pregnant. What are the risks? Talk with your health care provider about risks. The most common problems include: A mild allergic reaction. This includes red, swollen areas of skin (hives) and itching. Fever or chills. This may be the body's response to new blood cells received. This may happen during or up to 4 hours after the transfusion. More serious problems may include: A serious allergic reaction. This includes breathing trouble or swelling around the face and lips. Too much fluid in the lungs. This may cause breathing problems. Lung injury. This causes breathing trouble and low oxygen in the blood. This can happen within hours of the transfusion or days later. Too much iron. This can happen after getting many blood  transfusions over a period of time. An infection or virus passed through the blood. This is rare. Donated blood is carefully tested before it is given. Your body's defense system (immune system) trying to attack the new blood cells. This is rare. Symptoms may include fever, chills, nausea, low blood pressure, and low back or chest pain. Donated cells attacking healthy tissues. This is rare. What happens before the procedure? You will have a blood test to find out your blood type. The test also finds out what type of blood your body will accept and matches it to the donor type. If you are going to have a planned surgery, you may be able to donate your own blood. This may be done in case you need a transfusion. You will have your temperature, blood pressure, and pulse checked. You may receive medicine to help prevent an allergic reaction. This may be done if you have had a reaction to a transfusion before. This medicine may be given to you by mouth or through an IV tube. What happens during the procedure?  An IV tube will be put into one of your veins. The bag of blood will be attached to your IV tube. Then, the blood will enter through your vein. Your temperature, blood pressure, and pulse will be checked often. This is done to find early signs of a transfusion reaction. Tell your nurse right away if you have any of these symptoms: Shortness of breath or trouble breathing. Chest or back pain. Fever or chills. Red, swollen areas of skin or itching. If you have any signs  or symptoms of a reaction, your transfusion will be stopped. You may also be given medicine. When the transfusion is finished, your IV tube will be taken out. Pressure may be put on the IV site for a few minutes. A bandage (dressing) will be put on the IV site. The procedure may vary among doctors and hospitals. What happens after the procedure? You will be monitored until you leave the hospital or clinic. This includes  checking your temperature, blood pressure, pulse, breathing rate, and blood oxygen level. Your blood may be tested to see how you have responded to the transfusion. You may be warmed with fluids or blankets. This is done to keep the temperature of your body normal. If you have your procedure in an outpatient setting, you will be told whom to contact to report any reactions. Where to find more information Visit the American [employer]: redcross.org Summary A blood transfusion is a procedure in which you receive blood through an IV tube. The blood you are given may be made up of different blood cells. You may receive red blood cells, platelets, plasma, or white blood cells. Your temperature, blood pressure, and pulse will be checked often. After the procedure, your blood may be tested to see how you have responded. This information is not intended to replace advice given to you by your health care provider. Make sure you discuss any questions you have with your health care provider.  [facility] - A DEPT OF [facility]  Discharge Instructions: Thank you for choosing [facility] to provide your oncology and hematology care.   If you have a lab appointment with the Cancer Center, please go directly to the Cancer Center and check in at the registration area.   Wear comfortable clothing and clothing appropriate for easy access to any Portacath or PICC line.   We strive to give you quality time with your provider. You may need to reschedule your appointment if you arrive late (15 or more minutes).  Arriving late affects you and other patients whose appointments are after yours.  Also, if you miss three or more appointments without notifying the office, you may be dismissed from the clinic at the providers discretion.      For prescription refill requests, have your pharmacy contact our office and allow 72 hours for refills to be completed.    Today you  received the following chemotherapy and/or immunotherapy agent: Azacitidine  (Vidaza )   To help prevent nausea and vomiting after your treatment, we encourage you to take your nausea medication as directed.  BELOW ARE SYMPTOMS THAT SHOULD BE REPORTED IMMEDIATELY: *FEVER GREATER THAN 100.4 F (38 C) OR HIGHER *CHILLS OR SWEATING *NAUSEA AND VOMITING THAT IS NOT CONTROLLED WITH YOUR NAUSEA MEDICATION *UNUSUAL SHORTNESS OF BREATH *UNUSUAL BRUISING OR BLEEDING *URINARY PROBLEMS (pain or burning when urinating, or frequent urination) *BOWEL PROBLEMS (unusual diarrhea, constipation, pain near the anus) TENDERNESS IN MOUTH AND THROAT WITH OR WITHOUT PRESENCE OF ULCERS (sore throat, sores in mouth, or a toothache) UNUSUAL RASH, SWELLING OR PAIN  UNUSUAL VAGINAL DISCHARGE OR ITCHING   Items with * indicate a potential emergency and should be followed up as soon as possible or go to the Emergency Department if any problems should occur.  Please show the CHEMOTHERAPY ALERT CARD or IMMUNOTHERAPY ALERT CARD at check-in to the Emergency Department and triage nurse.  Should you have questions after your visit or need to cancel or reschedule  your appointment, please contact [facility] - A DEPT OF [facility]  Dept: [phone]  and follow the prompts.  Office hours are 8:00 a.m. to 4:30 p.m. Monday - Friday. Please note that voicemails left after 4:00 p.m. may not be returned until the following business day.  We are closed weekends and major holidays. You have access to a nurse at all times for urgent questions. Please call the main number to the clinic Dept: [phone] and follow the prompts.   For any non-urgent questions, you may also contact your provider using MyChart. We now offer e-Visits for anyone 42 and older to request care online for non-urgent symptoms. For details visit [email or web address].   Also download the MyChart app! Go to the app store, search  MyChart, open the app, select [place], and log in with your MyChart username and password.   Document Revised: 05/07/2021 Document Reviewed: 05/07/2021 Elsevier Patient Education  2024 [employer].

## 2024-03-13 ENCOUNTER — Encounter: Payer: Self-pay | Admitting: Hematology

## 2024-03-13 ENCOUNTER — Inpatient Hospital Stay

## 2024-03-13 VITALS — BP 134/60 | HR 84 | Temp 97.5°F | Resp 18

## 2024-03-13 DIAGNOSIS — D46Z Other myelodysplastic syndromes: Secondary | ICD-10-CM

## 2024-03-13 DIAGNOSIS — Z5111 Encounter for antineoplastic chemotherapy: Secondary | ICD-10-CM | POA: Diagnosis not present

## 2024-03-13 LAB — TYPE AND SCREEN
ABO/RH(D): O POS
Antibody Screen: NEGATIVE
Unit division: 0

## 2024-03-13 LAB — BPAM RBC
Blood Product Expiration Date: 202602162359
ISSUE DATE / TIME: 202601201451
Unit Type and Rh: 202602162359
Unit Type and Rh: 5100

## 2024-03-13 MED ORDER — ONDANSETRON HCL 8 MG PO TABS
8.0000 mg | ORAL_TABLET | Freq: Once | ORAL | Status: AC
  Administered 2024-03-13: 8 mg via ORAL
  Filled 2024-03-13: qty 1

## 2024-03-13 MED ORDER — SODIUM CHLORIDE 0.9 % IV SOLN: INTRAVENOUS | Status: DC

## 2024-03-13 MED ORDER — SODIUM CHLORIDE 0.9 % IV SOLN
75.0000 mg/m2 | Freq: Once | INTRAVENOUS | Status: AC
  Administered 2024-03-13: 113 mg via INTRAVENOUS
  Filled 2024-03-13: qty 11.3

## 2024-03-13 NOTE — Patient Instructions (Signed)
 [facility] A [facility]  Discharge Instructions: Thank you for choosing [facility] to provide your oncology and hematology care.   If you have a lab appointment with the Cancer Center, please go directly to the Cancer Center and check in at the registration area.   Wear comfortable clothing and clothing appropriate for easy access to any Portacath or PICC line.   We strive to give you quality time with your provider. You may need to reschedule your appointment if you arrive late (15 or more minutes).  Arriving late affects you and other patients whose appointments are after yours.  Also, if you miss three or more appointments without notifying the office, you may be dismissed from the clinic at the provider's discretion.      For prescription refill requests, have your pharmacy contact our office and allow 72 hours for refills to be completed.    Today you received the following chemotherapy and/or immunotherapy agents: Vidaza      To help prevent nausea and vomiting after your treatment, we encourage you to take your nausea medication as directed.  BELOW ARE SYMPTOMS THAT SHOULD BE REPORTED IMMEDIATELY: *FEVER GREATER THAN 100.4 F (38 C) OR HIGHER *CHILLS OR SWEATING *NAUSEA AND VOMITING THAT IS NOT CONTROLLED WITH YOUR NAUSEA MEDICATION *UNUSUAL SHORTNESS OF BREATH *UNUSUAL BRUISING OR BLEEDING *URINARY PROBLEMS (pain or burning when urinating, or frequent urination) *BOWEL PROBLEMS (unusual diarrhea, constipation, pain near the anus) TENDERNESS IN MOUTH AND THROAT WITH OR WITHOUT PRESENCE OF ULCERS (sore throat, sores in mouth, or a toothache) UNUSUAL RASH, SWELLING OR PAIN  UNUSUAL VAGINAL DISCHARGE OR ITCHING   Items with * indicate a potential emergency and should be followed up as soon as possible or go to the Emergency Department if any problems should occur.  Please show the CHEMOTHERAPY ALERT CARD or IMMUNOTHERAPY  ALERT CARD at check-in to the Emergency Department and triage nurse.  Should you have questions after your visit or need to cancel or reschedule your appointment, please contact [facility] DEPT OF [facility]  Dept: [phone]  and follow the prompts.  Office hours are 8:00 a.m. to 4:30 p.m. Monday - Friday. Please note that voicemails left after 4:00 p.m. may not be returned until the following business day.  We are closed weekends and major holidays. You have access to a nurse at all times for urgent questions. Please call the main number to the clinic Dept: [phone] and follow the prompts.   For any non-urgent questions, you may also contact your provider using MyChart. We now offer e-Visits for anyone 5 and older to request care online for non-urgent symptoms. For details visit [email or web address].   Also download the MyChart app! Go to the app store, search "MyChart", open the app, select [place], and log in with your MyChart username and password.

## 2024-03-13 NOTE — Progress Notes (Signed)
 Patient stated that she experienced some diarrhea last night (03/12/2024) for about 3 hours. She stated that she had about 3-4 episodes with no presence of blood or mucus in her stool, no abdominal cramping or fever. Patient was given the OK to take imodium per Dr. Onesimo as long as pt does not experience any symptoms of possible infection.

## 2024-03-14 ENCOUNTER — Inpatient Hospital Stay

## 2024-03-14 VITALS — BP 122/57 | HR 81 | Temp 98.3°F | Resp 16 | Wt 112.8 lb

## 2024-03-14 DIAGNOSIS — D649 Anemia, unspecified: Secondary | ICD-10-CM

## 2024-03-14 DIAGNOSIS — D46Z Other myelodysplastic syndromes: Secondary | ICD-10-CM

## 2024-03-14 DIAGNOSIS — Z5111 Encounter for antineoplastic chemotherapy: Secondary | ICD-10-CM | POA: Diagnosis not present

## 2024-03-14 LAB — CBC WITH DIFFERENTIAL (CANCER CENTER ONLY)
Abs Immature Granulocytes: 0.16 K/uL — ABNORMAL HIGH (ref 0.00–0.07)
Basophils Absolute: 0.1 K/uL (ref 0.0–0.1)
Basophils Relative: 2 %
Eosinophils Absolute: 0.2 K/uL (ref 0.0–0.5)
Eosinophils Relative: 4 %
HCT: 21.5 % — ABNORMAL LOW (ref 36.0–46.0)
Hemoglobin: 7.3 g/dL — ABNORMAL LOW (ref 12.0–15.0)
Immature Granulocytes: 3 %
Lymphocytes Relative: 28 %
Lymphs Abs: 1.4 K/uL (ref 0.7–4.0)
MCH: 34.6 pg — ABNORMAL HIGH (ref 26.0–34.0)
MCHC: 34 g/dL (ref 30.0–36.0)
MCV: 101.9 fL — ABNORMAL HIGH (ref 80.0–100.0)
Monocytes Absolute: 0.6 K/uL (ref 0.1–1.0)
Monocytes Relative: 12 %
Neutro Abs: 2.6 K/uL (ref 1.7–7.7)
Neutrophils Relative %: 51 %
Platelet Count: 78 K/uL — ABNORMAL LOW (ref 150–400)
RBC: 2.11 MIL/uL — ABNORMAL LOW (ref 3.87–5.11)
RDW: 20.8 % — ABNORMAL HIGH (ref 11.5–15.5)
WBC Count: 5 K/uL (ref 4.0–10.5)
nRBC: 0 % (ref 0.0–0.2)

## 2024-03-14 LAB — SAMPLE TO BLOOD BANK

## 2024-03-14 MED ORDER — ONDANSETRON HCL 8 MG PO TABS
8.0000 mg | ORAL_TABLET | Freq: Once | ORAL | Status: AC
  Administered 2024-03-14: 8 mg via ORAL
  Filled 2024-03-14: qty 1

## 2024-03-14 MED ORDER — SODIUM CHLORIDE 0.9 % IV SOLN
75.0000 mg/m2 | Freq: Once | INTRAVENOUS | Status: AC
  Administered 2024-03-14: 113 mg via INTRAVENOUS
  Filled 2024-03-14: qty 11.3

## 2024-03-14 MED ORDER — SODIUM CHLORIDE 0.9 % IV SOLN: INTRAVENOUS | Status: DC

## 2024-03-14 NOTE — Patient Instructions (Signed)
 [facility] A [facility]  Discharge Instructions: Thank you for choosing [facility] to provide your oncology and hematology care.   If you have a lab appointment with the Cancer Center, please go directly to the Cancer Center and check in at the registration area.   Wear comfortable clothing and clothing appropriate for easy access to any Portacath or PICC line.   We strive to give you quality time with your provider. You may need to reschedule your appointment if you arrive late (15 or more minutes).  Arriving late affects you and other patients whose appointments are after yours.  Also, if you miss three or more appointments without notifying the office, you may be dismissed from the clinic at the provider's discretion.      For prescription refill requests, have your pharmacy contact our office and allow 72 hours for refills to be completed.    Today you received the following chemotherapy and/or immunotherapy agents: Vidaza      To help prevent nausea and vomiting after your treatment, we encourage you to take your nausea medication as directed.  BELOW ARE SYMPTOMS THAT SHOULD BE REPORTED IMMEDIATELY: *FEVER GREATER THAN 100.4 F (38 C) OR HIGHER *CHILLS OR SWEATING *NAUSEA AND VOMITING THAT IS NOT CONTROLLED WITH YOUR NAUSEA MEDICATION *UNUSUAL SHORTNESS OF BREATH *UNUSUAL BRUISING OR BLEEDING *URINARY PROBLEMS (pain or burning when urinating, or frequent urination) *BOWEL PROBLEMS (unusual diarrhea, constipation, pain near the anus) TENDERNESS IN MOUTH AND THROAT WITH OR WITHOUT PRESENCE OF ULCERS (sore throat, sores in mouth, or a toothache) UNUSUAL RASH, SWELLING OR PAIN  UNUSUAL VAGINAL DISCHARGE OR ITCHING   Items with * indicate a potential emergency and should be followed up as soon as possible or go to the Emergency Department if any problems should occur.  Please show the CHEMOTHERAPY ALERT CARD or IMMUNOTHERAPY  ALERT CARD at check-in to the Emergency Department and triage nurse.  Should you have questions after your visit or need to cancel or reschedule your appointment, please contact [facility] DEPT OF [facility]  Dept: [phone]  and follow the prompts.  Office hours are 8:00 a.m. to 4:30 p.m. Monday - Friday. Please note that voicemails left after 4:00 p.m. may not be returned until the following business day.  We are closed weekends and major holidays. You have access to a nurse at all times for urgent questions. Please call the main number to the clinic Dept: [phone] and follow the prompts.   For any non-urgent questions, you may also contact your provider using MyChart. We now offer e-Visits for anyone 5 and older to request care online for non-urgent symptoms. For details visit [email or web address].   Also download the MyChart app! Go to the app store, search "MyChart", open the app, select [place], and log in with your MyChart username and password.

## 2024-03-15 ENCOUNTER — Inpatient Hospital Stay

## 2024-03-15 VITALS — BP 116/50 | HR 82 | Temp 97.9°F | Resp 16

## 2024-03-15 DIAGNOSIS — Z5111 Encounter for antineoplastic chemotherapy: Secondary | ICD-10-CM | POA: Diagnosis not present

## 2024-03-15 DIAGNOSIS — D46Z Other myelodysplastic syndromes: Secondary | ICD-10-CM

## 2024-03-15 MED ORDER — ONDANSETRON HCL 8 MG PO TABS
8.0000 mg | ORAL_TABLET | Freq: Once | ORAL | Status: AC
  Administered 2024-03-15: 8 mg via ORAL
  Filled 2024-03-15: qty 1

## 2024-03-15 MED ORDER — SODIUM CHLORIDE 0.9 % IV SOLN
75.0000 mg/m2 | Freq: Once | INTRAVENOUS | Status: AC
  Administered 2024-03-15: 113 mg via INTRAVENOUS
  Filled 2024-03-15: qty 11.3

## 2024-03-15 MED ORDER — SODIUM CHLORIDE 0.9 % IV SOLN: INTRAVENOUS | Status: DC

## 2024-03-15 MED FILL — Azacitidine For Inj 100 MG: INTRAMUSCULAR | Qty: 11.3 | Status: AC

## 2024-03-15 NOTE — Patient Instructions (Signed)
 [facility]   Discharge Instructions: Thank you for choosing [facility] to provide your oncology and hematology care.   If you have a lab appointment with the Cancer Center, please go directly to the Cancer Center and check in at the registration area.   Wear comfortable clothing and clothing appropriate for easy access to any Portacath or PICC line.   We strive to give you quality time with your provider. You may need to reschedule your appointment if you arrive late (15 or more minutes).  Arriving late affects you and other patients whose appointments are after yours.  Also, if you miss three or more appointments without notifying the office, you may be dismissed from the clinic at the provider's discretion.      For prescription refill requests, have your pharmacy contact our office and allow 72 hours for refills to be completed.    Today you received the following chemotherapy and/or immunotherapy agents: Azacitidine (Vidaza)      To help prevent nausea and vomiting after your treatment, we encourage you to take your nausea medication as directed.  BELOW ARE SYMPTOMS THAT SHOULD BE REPORTED IMMEDIATELY: *FEVER GREATER THAN 100.4 F (38 C) OR HIGHER *CHILLS OR SWEATING *NAUSEA AND VOMITING THAT IS NOT CONTROLLED WITH YOUR NAUSEA MEDICATION *UNUSUAL SHORTNESS OF BREATH *UNUSUAL BRUISING OR BLEEDING *URINARY PROBLEMS (pain or burning when urinating, or frequent urination) *BOWEL PROBLEMS (unusual diarrhea, constipation, pain near the anus) TENDERNESS IN MOUTH AND THROAT WITH OR WITHOUT PRESENCE OF ULCERS (sore throat, sores in mouth, or a toothache) UNUSUAL RASH, SWELLING OR PAIN  UNUSUAL VAGINAL DISCHARGE OR ITCHING   Items with * indicate a potential emergency and should be followed up as soon as possible or go to the Emergency Department if any problems should occur.  Please show the CHEMOTHERAPY ALERT CARD or  IMMUNOTHERAPY ALERT CARD at check-in to the Emergency Department and triage nurse.  Should you have questions after your visit or need to cancel or reschedule your appointment, please contact [facility]  Dept: [phone]  and follow the prompts.  Office hours are 8:00 a.m. to 4:30 p.m. Monday - Friday. Please note that voicemails left after 4:00 p.m. may not be returned until the following business day.  We are closed weekends and major holidays. You have access to a nurse at all times for urgent questions. Please call the main number to the clinic Dept: [phone] and follow the prompts.   For any non-urgent questions, you may also contact your provider using MyChart. We now offer e-Visits for anyone 1 and older to request care online for non-urgent symptoms. For details visit [email or web address].   Also download the MyChart app! Go to the app store, search "MyChart", open the app, select [place], and log in with your MyChart username and password.

## 2024-03-19 ENCOUNTER — Ambulatory Visit: Admitting: Internal Medicine

## 2024-03-20 ENCOUNTER — Other Ambulatory Visit: Payer: Self-pay

## 2024-03-20 ENCOUNTER — Inpatient Hospital Stay: Admitting: Hematology

## 2024-03-20 ENCOUNTER — Inpatient Hospital Stay

## 2024-03-20 VITALS — BP 110/58 | HR 87 | Temp 97.7°F | Resp 18 | Wt 115.2 lb

## 2024-03-20 DIAGNOSIS — D649 Anemia, unspecified: Secondary | ICD-10-CM

## 2024-03-20 DIAGNOSIS — D46Z Other myelodysplastic syndromes: Secondary | ICD-10-CM

## 2024-03-20 DIAGNOSIS — Z5111 Encounter for antineoplastic chemotherapy: Secondary | ICD-10-CM | POA: Diagnosis not present

## 2024-03-20 LAB — CMP (CANCER CENTER ONLY)
ALT: 6 U/L (ref 0–44)
AST: 13 U/L — ABNORMAL LOW (ref 15–41)
Albumin: 4 g/dL (ref 3.5–5.0)
Alkaline Phosphatase: 41 U/L (ref 38–126)
Anion gap: 9 (ref 5–15)
BUN: 24 mg/dL — ABNORMAL HIGH (ref 8–23)
CO2: 25 mmol/L (ref 22–32)
Calcium: 8.7 mg/dL — ABNORMAL LOW (ref 8.9–10.3)
Chloride: 106 mmol/L (ref 98–111)
Creatinine: 1.19 mg/dL — ABNORMAL HIGH (ref 0.44–1.00)
GFR, Estimated: 45 mL/min — ABNORMAL LOW
Glucose, Bld: 125 mg/dL — ABNORMAL HIGH (ref 70–99)
Potassium: 5 mmol/L (ref 3.5–5.1)
Sodium: 140 mmol/L (ref 135–145)
Total Bilirubin: 0.5 mg/dL (ref 0.0–1.2)
Total Protein: 6.7 g/dL (ref 6.5–8.1)

## 2024-03-20 LAB — CBC WITH DIFFERENTIAL (CANCER CENTER ONLY)
Abs Immature Granulocytes: 0.06 10*3/uL (ref 0.00–0.07)
Basophils Absolute: 0.1 10*3/uL (ref 0.0–0.1)
Basophils Relative: 1 %
Eosinophils Absolute: 0.1 10*3/uL (ref 0.0–0.5)
Eosinophils Relative: 1 %
HCT: 20 % — ABNORMAL LOW (ref 36.0–46.0)
Hemoglobin: 6.7 g/dL — CL (ref 12.0–15.0)
Immature Granulocytes: 2 %
Lymphocytes Relative: 56 %
Lymphs Abs: 2 10*3/uL (ref 0.7–4.0)
MCH: 33.8 pg (ref 26.0–34.0)
MCHC: 33.5 g/dL (ref 30.0–36.0)
MCV: 101 fL — ABNORMAL HIGH (ref 80.0–100.0)
Monocytes Absolute: 0.2 10*3/uL (ref 0.1–1.0)
Monocytes Relative: 4 %
Neutro Abs: 1.3 10*3/uL — ABNORMAL LOW (ref 1.7–7.7)
Neutrophils Relative %: 36 %
Platelet Count: 42 10*3/uL — ABNORMAL LOW (ref 150–400)
RBC: 1.98 MIL/uL — ABNORMAL LOW (ref 3.87–5.11)
RDW: 18.7 % — ABNORMAL HIGH (ref 11.5–15.5)
WBC Count: 3.6 10*3/uL — ABNORMAL LOW (ref 4.0–10.5)
nRBC: 0 % (ref 0.0–0.2)

## 2024-03-20 LAB — SAMPLE TO BLOOD BANK

## 2024-03-20 LAB — PREPARE RBC (CROSSMATCH)

## 2024-03-21 ENCOUNTER — Inpatient Hospital Stay

## 2024-03-21 DIAGNOSIS — D46Z Other myelodysplastic syndromes: Secondary | ICD-10-CM

## 2024-03-21 DIAGNOSIS — Z5111 Encounter for antineoplastic chemotherapy: Secondary | ICD-10-CM | POA: Diagnosis not present

## 2024-03-21 MED ORDER — SODIUM CHLORIDE 0.9% IV SOLUTION
250.0000 mL | INTRAVENOUS | Status: DC
  Administered 2024-03-21: 250 mL via INTRAVENOUS

## 2024-03-21 MED ORDER — METHYLPREDNISOLONE SODIUM SUCC 125 MG IJ SOLR
40.0000 mg | Freq: Once | INTRAMUSCULAR | Status: AC
  Administered 2024-03-21: 40 mg via INTRAVENOUS
  Filled 2024-03-21: qty 2

## 2024-03-21 MED ORDER — ACETAMINOPHEN 325 MG PO TABS
650.0000 mg | ORAL_TABLET | Freq: Once | ORAL | Status: AC
  Administered 2024-03-21: 650 mg via ORAL
  Filled 2024-03-21: qty 2

## 2024-03-21 NOTE — Patient Instructions (Signed)
 Blood Transfusion, Adult A blood transfusion is a procedure in which you receive blood or a type of blood cell (blood component) through an IV. You may need a blood transfusion when you have a low blood count, which is a low number of any blood cell. This may result from a bleeding disorder, illness, injury, or surgery. The blood may come from a donor, or you may be able to have your own blood collected and stored (autologous blood donation) before a planned surgery. The blood given in a transfusion may be made up of different blood components. You may receive: Red blood cells. These carry oxygen to the cells in the body. Platelets. These help your blood to clot. Plasma. This is the liquid part of your blood. It carries proteins and other substances throughout the body. White blood cells. These help you fight infections. If you have hemophilia or another clotting disorder, you may also receive other types of blood products. Depending on the type of blood product, this procedure may take 1-4 hours to complete. Tell a health care provider about: Any bleeding problems you have. Any previous reactions you have had during a blood transfusion. Any allergies you have. All medicines you are taking, including vitamins, herbs, eye drops, creams, and over-the-counter medicines. Any surgeries you have had. Any medical conditions you have. Whether you are pregnant or may be pregnant. What are the risks? Talk with your health care provider about risks. The most common problems include: A mild allergic reaction, such as red, swollen areas of skin (hives) and itching. Fever or chills. This may be the body's response to new blood cells received. This may occur during or up to 4 hours after the transfusion. More serious problems may include: A serious allergic reaction that causes difficulty breathing or swelling around the face and lips. Transfusion-associated circulatory overload (TACO), or too much fluid in  the lungs. This may cause breathing problems. Transfusion-related acute lung injury (TRALI), which causes breathing difficulty and low oxygen in the blood. This can occur within hours of the transfusion or several days later. Iron overload. This can happen after receiving many blood transfusions over a period of time. Infection or virus being transmitted. This is rare because donated blood is carefully tested before it is given. Hemolytic transfusion reaction. This is rare. It happens when the body's defense system (immune system)tries to attack the new blood cells. Symptoms may include fever, chills, nausea, low blood pressure, and low back or chest pain. Transfusion-associated graft-versus-host disease (TAGVHD). This is rare. It happens when donated cells attack the body's healthy tissues. What happens before the procedure? You will have a blood test to check your blood type. This test is done to know what kind of blood your body will accept and to match it to the donor blood. If you are going to have a planned surgery, you may be able to do an autologous blood donation. This may be done in case you need to have a transfusion. You will have your temperature, blood pressure, and pulse checked before the transfusion. If you have had an allergic reaction to a transfusion in the past, you may be given medicine to help prevent a reaction. This medicine may be given to you by mouth (orally) or through an IV. What happens during the procedure?  An IV will be inserted into one of your veins. The bag of blood will be attached to your IV. The blood will then enter through your vein. Your temperature, blood pressure,  and pulse will be monitored during the transfusion. This monitoring is done to detect early signs of a transfusion reaction. Tell your nurse right away if you have any of these symptoms during the transfusion: Shortness of breath or trouble breathing. Chest or back pain. Fever or  chills. Itching or hives. If you have any signs or symptoms of a reaction, your transfusion will be stopped and you may be given medicine. When the transfusion is complete, your IV will be removed. Pressure may be applied to the IV site for a few minutes. A bandage (dressing)will be applied. The procedure may vary among health care providers and hospitals. What happens after the procedure? Your temperature, blood pressure, pulse, breathing rate, and blood oxygen level will be monitored until you leave the hospital or clinic. Your blood may be tested to see how you have responded to the transfusion. You may be warmed with fluids or blankets to maintain a normal body temperature. If you receive your blood transfusion in an outpatient setting, you will be told whom to contact to report any reactions. Where to find more information Visit the American Red Cross: redcross.org Summary A blood transfusion is a procedure in which you receive blood or a type of blood cell (blood component) through an IV. The blood given in a transfusion may be made up of different blood components. You may receive red blood cells, platelets, plasma, or white blood cells depending on the condition treated. Your temperature, blood pressure, and pulse will be monitored before, during, and after the transfusion. After the transfusion, your blood may be tested to see how your body has responded. This information is not intended to replace advice given to you by your health care provider. Make sure you discuss any questions you have with your health care provider. Document Revised: 05/07/2021 Document Reviewed: 05/07/2021 Elsevier Patient Education  2024 [employer].

## 2024-03-21 NOTE — Progress Notes (Signed)
 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  Follow-up for continued evaluation and management of Estrogen receptor positive breast cancer.  HISTORY OF PRESENTING ILLNESS: (03/17/2017 initial establishment with Dr. Layla Inocente FORBES Lutz had routine screening mammography on 02/27/2017 showing a possible asymmetry in the left breast. She underwent unilateral diagnostic mammography with tomography and left breast ultrasonography at [facility] on 03/03/2017 showing: breast density category C. Small left breast mass in the 3:30 o'clock lower outer position 5 cm from the nipple measuring 0.8 x 0.5 x 0.7 cm is suspicious for breast malignancy. The left axilla is negative for lymphadenopathy.    Accordingly on 03/08/2016 she proceeded to biopsy of the left breast mass in question. The pathology from this procedure showed ([id number]): invasive mammary carcinoma. 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 Prognostic indicators significant for: estrogen receptor, 100% positive and progesterone receptor, 70% positive, both with strong staining intensity. Proliferation marker Ki67 at 2%. HER2 not amplified with ratio HER2/CEP17 signals 1.39 and average HER2 copies per cell 1.95.   Heidi Lutz was evaluated in the breast cancer clinic on 03/17/2017 accompanied by her husband Heidi Lutz and her daughter Heidi Lutz.. Her case was also presented at the multidisciplinary breast cancer conference on 03/15/2017. At that time a preliminary plan was proposed: Breast conserving surgery with sentinel lymph node sampling, consideration of Oncotype versus simply opting for antiestrogens;  consideration of adjuvant radiation in addition to antiestrogens  SUMMARY OF ONCOLOGIC HISTORY  04/07/2017:left breast lumpectomy with sentinel lymph node sampling  pT1b pN0, stage IA invasive ductal carcinoma, grade II, with negative margins, total 1 lymph node removed from the axilla, ER 100%, PR 70%, HER2 negative ratio 1.39, Ki-67 2% adjuvant radiation 05/10/2017 - 06/06/2017  Oncology History  Malignant neoplasm of central portion of left breast in female, estrogen receptor positive (HCC)  03/08/2017 Initial Diagnosis   Central left breast biopsy for a clinical T1b N0, stage IA i IDC, grade 1, e ER/PR positive, HER-2 not amplified, with an Ki-67 of 2%   03/16/2017 Cancer Staging   Staging form: Breast, AJCC 8th Edition - Clinical stage from 03/16/2017: Stage IA (cT1b, cN0, cM0, G1, ER+, PR+, HER2-) - Signed by Odean Potts, MD on 04/19/2022 Method of lymph node assessment: Clinical Histologic grading system: 3 grade system Laterality: Left Tumor size (mm): 8   04/07/2017 Surgery   left breast lumpectomy with sentinel lymph node sampling  pT1b pN0, stage IA invasive ductal carcinoma, grade II, with negative margins, total 1 lymph node removed from the axilla   MDS (myelodysplastic syndrome), high grade (HCC)  01/30/2024 Initial Diagnosis   MDS (myelodysplastic syndrome), high grade (HCC)   03/11/2024 -  Chemotherapy   Patient is on Treatment Plan : MYELODYSPLASIA  Azacitidine  IV D1-5 q28d      Current treatment: Tamoxifen  started 03/17/2017 Plan of treatment: 10 years (patient preference)  INTERVAL HISTORY:  Heidi Lutz is a 84 y.o. female who is here today for continued evaluation and management of  newly diagnosed MDS with EB1 presenting as refractrory anemia and pancytopenia. SHe is accompanied by daughter, son-in-law, and husband.  she was last seen by me on on 02/08/2024; at the time she mentioned experiencing low diastolic blood pressures with  associated symptoms of  dizziness.  Today, she is here for a toxicity check after cycle 1 of Vidaza  from 1/19 through 03/15/2024. She notes that she tolerated her Vidaza  treatment well overall with no nausea vomiting or diarrhea.  No acute skin rashes. Notes grade 1-2 fatigue likely from symptomatic anemia with a hemoglobin of 6.7. No overt dizziness or lightheadedness No fevers no chills no night sweats.  No other infection issues. Accompanied by multiple family members for the visit.   REVIEW OF SYSTEMS:   10 Point review of systems of done and is negative except as noted above.  MEDICAL HISTORY Past Medical History:  Diagnosis Date   Age-related osteoporosis without current pathological fracture 10/31/2019   Allergic rhinitis 07/31/2007   Overview:   S/p allergy shots, 20 years ago Dr. Mariea     10/1 IMO update   Anemia    Aortic atherosclerosis 08/16/2022   Basal cell carcinoma 12/15/2014   Overview:   Managed by Dr. Tricia, derm   [facility]) 01/2017   left breast cancer, basal cell and 1 melanoma   Cervical spondylosis 07/13/2022   Closed compression fracture of L1 lumbar vertebra, with routine healing, subsequent encounter 11/26/2013   Overview:   Normal DEXA 2015     Last Assessment & Plan:   New order for DEXA written today. Patient is getting epidural steroid injections with pain management  Overview:   Overview:   Normal DEXA 2015     Last Assessment & Plan:   continued improvement in pain, will refill tramadol  and robaxin today.  Will return to PCP for follow-up   Complication of anesthesia    heart rate spikeed in the PACU on her 2nd knee surgery   Environmental allergies    Essential hypertension 09/19/2008   Last Assessment & Plan:   Blood pressure is elevated today. I reviewed her last few pain management notes her blood pressure was normal. Patient is asymptomatic and I suspect today's blood pressure is an outlier. She will continue home monitoring at her offices and will return for  further evaluation if remains elevated   GAD (generalized anxiety disorder) 04/25/2017   Last Assessment & Plan:   See a/p above   High cholesterol    History of UTI 05/25/2022   History of vertebral compression fracture 07/16/2019   Hypercalcemia 11/22/2023   Hyperlipidemia 04/22/2009   Last Assessment & Plan:   Compliant with statin, continue   IBS (irritable bowel syndrome) 08/18/2006   Overview:   with IBS        Last Assessment & Plan:   Refill Bentyl for when necessary use. No evidence of infectious or inflammatory, or malignancy symptoms. Advised to follow-up for further evaluation if more frequent or new symptoms   Insomnia 02/02/2010   Last Assessment & Plan:   Refilled Ambien  quantity #30 for the next calendar year. She denies excessive sedation and is aware to not drive when taking medication.   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  Will start Lexapro today.  We discussed interim use of benzodiazepines for pre-procedural or severe episodes.  She reports she has had panic attacks in the past but has not had them recently.  She never took diazepam that was previously prescribed for a procedure.  We discussed   Malignant neoplasm of central portion of left breast in female, estrogen receptor positive (HCC) 03/16/2017   Mixed dyslipidemia 03/22/2017   Mixed hyperlipidemia 03/22/2017   OAB (overactive bladder) 02/2020   Osteoarthritis    Osteoarthritis of spine with radiculopathy, lumbar region 02/27/2015   Osteopenia 03/03/2017   Overview:   DEXA 02/2017   Personal history of radiation therapy    Pleuritic chest pain 04/10/2018   PONV (postoperative nausea and vomiting)    with 1st knee surgery had n/v, none since   Pre-operative cardiovascular  examination 03/22/2017   Raynaud disease    Raynauds disease-takes procardia    Raynaud's disease 08/18/2006   Rheumatoid arthritis (HCC)    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 Signa -- [place], [place]   Urge incontinence 05/25/2022    SURGICAL HISTORY Past Surgical History:  Procedure Laterality Date   ABDOMINAL HYSTERECTOMY  1972   APPENDECTOMY  1953   BREAST BIOPSY Left 05/19/2020   benign   BREAST LUMPECTOMY Left 2019   BREAST LUMPECTOMY WITH RADIOACTIVE SEED AND SENTINEL LYMPH NODE BIOPSY Left 04/07/2017   Procedure: BREAST LUMPECTOMY WITH RADIOACTIVE SEED AND SENTINEL LYMPH NODE BIOPSY;  Surgeon: Heidi Ned, MD;  Location: [facility] OR;  Service: General;  Laterality: Left;   BUNIONECTOMY  1999   COLONOSCOPY     EYE SURGERY     bil cataract   GANGLION CYST EXCISION  1968   IR IMAGING GUIDED PORT INSERTION  03/04/2024   JOINT REPLACEMENT Left 2017    reverse shoulder replacement   POSTERIOR CERVICAL FUSION/FORAMINOTOMY N/A 11/02/2023   Procedure: POSTERIOR CERVICAL FUSION/FORAMINOTOMY CERVICAL ONE-TWO;  Surgeon: Mavis Purchase, MD;  Location: [facility] OR;  Service: Neurosurgery;  Laterality: N/A;   REPLACEMENT TOTAL KNEE  2012, 2013   Right and Left   SHOULDER ARTHROSCOPY Left    SYMPATHECTOMY  1970   TONSILLECTOMY  1949    SOCIAL HISTORY Social History[1]  Social History   Social History Narrative    Not on file    SOCIAL DRIVERS OF HEALTH SDOH Screenings   Food Insecurity: No Food Insecurity (01/05/2024)  Housing: Low Risk (01/05/2024)  Transportation Needs: No Transportation Needs (01/05/2024)  Utilities: Not At Risk (01/05/2024)  Depression (PHQ2-9): Low Risk (03/21/2024)  Financial Resource Strain: Low Risk (05/03/2021)   Received from [facility]  Physical Activity: Insufficiently Active (05/03/2021)   Received from [facility]  Social Connections: Socially Integrated (01/05/2024)  Stress: No Stress Concern Present (05/03/2021)   Received from [facility]  Tobacco Use: Medium Risk (03/04/2024)     FAMILY HISTORY Family History  Adopted: Yes  Problem Relation Age of Onset   Breast cancer Neg Hx      ALLERGIES: is allergic to fluorouracil, other, clavulanic acid, codeine, hydrocodone , and penicillins.  MEDICATIONS  Current Outpatient Medications  Medication Sig Dispense Refill   albuterol  (VENTOLIN  HFA) 108 (90 Base) MCG/ACT inhaler Inhale 2 puffs into  the lungs every 6 (six) hours as needed for wheezing or shortness of breath.     atorvastatin  (LIPITOR) 20 MG tablet Take 20 mg by mouth at bedtime.     Black Cohosh 540 MG CAPS Take 540 mg by mouth daily.      BREZTRI  AEROSPHERE 160-9-4.8 MCG/ACT AERO inhaler Inhale 2 puffs into the lungs 2 (two) times daily as needed (for flares).     Calcium  Alginate-Silver (DYNAGINATE AG SILVER CAL 4X5 EX) Apply topically See admin instructions. Cut to size and apply to wound on right gluteal cheek with dressing change once a day     cefdinir  (OMNICEF ) 300 MG capsule Take 1 capsule (300 mg total) by mouth 2 (two) times daily. 7 capsule 0   cyclobenzaprine  (FLEXERIL ) 5 MG tablet Take 1 tablet (5 mg total) by mouth 3 (three) times daily as needed for muscle spasms. (Patient not taking: Reported on 01/30/2024)     denosumab (PROLIA) 60 MG/ML SOSY injection Inject 60 mg into the skin every 6 (six) months.     docusate sodium   (COLACE) 100 MG capsule Take 1 capsule (100 mg total) by mouth 2 (two) times daily. (Patient not taking: Reported on 01/30/2024)     famotidine  (PEPCID ) 40 MG tablet Take 0.5 tablets (20 mg total) by mouth daily as needed for heartburn or indigestion.     fosfomycin  (MONUROL ) 3 g PACK MIX AND STIR 1 PACKET INTO 3-4 OZ OF WATER AND DRINK EVERY 10 DAYS AS DIRECTED. TAKE IMMEDIATELY AFTER DISSOLVING (Patient taking differently: Take 3 g by mouth See admin instructions. MIX AND STIR 3 GRAMS (1 PACKET) INTO 3-4 OZ OF WATER AND DRINK BY MOUTH EVERY 10 DAYS AS DIRECTED. DRINK. IMMEDIATELY AFTER DISSOLVING.) 9 g 3   Glucosamine-Chondroit-Vit C-Mn (GLUCOSAMINE-CHONDROITIN MAX ST) CAPS Take 1 capsule by mouth daily.      HYDROcodone -acetaminophen  (NORCO/VICODIN) 5-325 MG tablet Take 1-2 tablets by mouth every 6 (six) hours as needed for moderate pain (pain score 4-6) or severe pain (pain score 7-10). 40 tablet 0   [Paused] KERENDIA  10 MG TABS Take 10 mg by mouth in the morning.     lidocaine  (LIDODERM ) 5 % APPLY 2 PATCHES TO SKIN EVERY DAY, REMOVE AND REPLACE PATCH AFTER 12 HRS (Patient not taking: Reported on 01/30/2024) 180 patch 0   lidocaine -prilocaine  (EMLA ) cream Apply to affected area once 30 g 3   linezolid  (ZYVOX ) 600 MG tablet Take 1 tablet (600 mg total) by mouth 2 (two) times daily. 7 tablet 0   loratadine  (CLARITIN ) 10 MG tablet Take 10 mg by mouth daily as needed for allergies or rhinitis.     megestrol  (MEGACE  ES) 625 MG/5ML suspension Take 5 mLs (625 mg total) by mouth daily. 150 mL 0   multivitamin (RENA-VIT) TABS tablet Take 1 tablet by mouth daily.     mupirocin  ointment (BACTROBAN ) 2 % Place 1 Application into the nose 2 (two) times daily.     [Paused] NIFEdipine  (PROCARDIA  XL/ADALAT -CC) 90 MG 24 hr tablet Take 90 mg by mouth See admin instructions. Take 90 mg by mouth in the morning and HOLD FOR A LOW B/P     ondansetron  (ZOFRAN ) 4 MG tablet Take 1 tablet (4 mg total) by mouth every 6 (six)  hours as needed for nausea or vomiting. (Patient not taking: Reported on 01/30/2024)     ondansetron  (ZOFRAN ) 8 MG tablet Take 1 tablet (8 mg total) by mouth every 8 (eight) hours as needed for nausea or vomiting. 30  tablet 1   pregabalin  (LYRICA ) 50 MG capsule Take 100 mg by mouth at bedtime.     Probiotic Product (PROBIOTIC ADVANCED PO) Take 1 capsule by mouth daily.      prochlorperazine  (COMPAZINE ) 10 MG tablet Take 1 tablet (10 mg total) by mouth every 6 (six) hours as needed for nausea or vomiting. 30 tablet 1   solifenacin  (VESICARE ) 10 MG tablet Take 1 tablet (10 mg total) by mouth every evening. 90 tablet 3   tamoxifen  (NOLVADEX ) 20 MG tablet TAKE 1 TABLET EVERY DAY 90 tablet 3   TYLENOL  8 HOUR ARTHRITIS PAIN 650 MG CR tablet Take 1,300 mg by mouth daily as needed for pain.     No current facility-administered medications for this visit.    PHYSICAL EXAMINATION: ECOG PERFORMANCE STATUS: 1 - Symptomatic but completely ambulatory VITALS: Vitals:   03/20/24 1501  BP: (!) 110/58  Pulse: 87  Resp: 18  Temp: 97.7 F (36.5 C)  SpO2: 100%   Filed Weights   03/20/24 1501  Weight: 115 lb 3.2 oz (52.3 kg)  Body mass index is 20.73 kg/m.  GENERAL: alert, in no acute distress and comfortable SKIN: no acute rashes, no significant lesions EYES: conjunctiva are pink and non-injected, sclera anicteric OROPHARYNX: MMM, no exudates, no oropharyngeal erythema or ulceration NECK: supple, no JVD LYMPH:  no palpable lymphadenopathy in the cervical, axillary or inguinal regions LUNGS: clear to auscultation b/l with normal respiratory effort HEART: regular rate & rhythm ABDOMEN:  normoactive bowel sounds , non tender, not distended, no hepatosplenomegaly Extremity: no pedal edema PSYCH: alert & oriented x 3 with fluent speech NEURO: no focal motor/sensory deficits  LABORATORY DATA:   I have reviewed the data as listed     Latest Ref Rng & Units 03/20/2024    2:30 PM 03/14/2024    2:53  PM 03/11/2024    2:07 PM  CBC EXTENDED  WBC 4.0 - 10.5 K/uL 3.6  5.0  5.4   RBC 3.87 - 5.11 MIL/uL 1.98  2.11  1.83   Hemoglobin 12.0 - 15.0 g/dL 6.7  7.3  6.4   HCT 63.9 - 46.0 % 20.0  21.5  18.9   Platelets 150 - 400 K/uL 42  78  113   NEUT# 1.7 - 7.7 K/uL 1.3  2.6  2.4   Lymph# 0.7 - 4.0 K/uL 2.0  1.4  1.8       Latest Ref Rng & Units 03/20/2024    2:30 PM 03/11/2024    2:07 PM 03/07/2024   11:19 AM  CMP  Glucose 70 - 99 mg/dL 874  898  872   BUN 8 - 23 mg/dL 24  25  27    Creatinine 0.44 - 1.00 mg/dL 8.80  8.56  8.49   Sodium 135 - 145 mmol/L 140  140  140   Potassium 3.5 - 5.1 mmol/L 5.0  4.6  5.6   Chloride 98 - 111 mmol/L 106  105  106   CO2 22 - 32 mmol/L 25  24  21    Calcium  8.9 - 10.3 mg/dL 8.7  9.0  9.1   Total Protein 6.5 - 8.1 g/dL 6.7  7.2  7.3   Total Bilirubin 0.0 - 1.2 mg/dL 0.5  0.6  0.5   Alkaline Phos 38 - 126 U/L 41  41  44   AST 15 - 41 U/L 13  14  16    ALT 0 - 44 U/L 6  <5  <5  12/20/2023    12/13/2023    12/04/2023 Surgical Pathology CASE: [id number] PATIENT: Scheryl Aylesworth Flow Pathology Report  Clinical history: myeloma   DIAGNOSIS:  - Abnormal blast population identified.  See comment.  COMMENT: Flow cytometric analysis identified an abnormal blast population constituting 7% of all cells analyzed.  The cells are positive for CD34, CD33, CD38, CD56, CD117 and HLA-DR.  Please refer to case W [id number] for additional details.   GATING AND PHENOTYPIC ANALYSIS:  Gated population: Flow cytometric immunophenotyping is performed using antibodies to the antigens listed in the table below. Electronic gates are placed around a cell cluster displaying light scatter properties corresponding to: blasts  Abnormal Cells in sample: 7%  Phenotype of Abnormal Cells: CD33, CD34, CD38, CD56, CD117, HLA-Dr                      Lymphoid Antigens       Myeloid Antigens Miscellaneous CD2  NEG  CD10 NEG  CD11b     NEG  CD45 POS CD3  NEG  CD19  NEG  CD11c     ND   HLA-Dr    POS CD4  NEG  CD20 NEG  CD13 NEG  CD34 POS CD5  NEG  CD22 ND   CD14 NEG  CD38 POS CD7  NEG  CD79b     ND   CD15 NEG  CD138     ND CD8  NEG  CD103     ND   CD16 NEG  TdT  ND CD25 ND   CD200     NEG  CD33 POS  CD123     NEG TCRab     ND   sKappa    NEG  CD64 NEG  CD41 ND TCRgd     NEG  sLambda   NEG  CD117     POS  CD61 ND CD56 POS  cKappa    ND   MPO  NEG  CD71 ND CD57 ND   cLambda   ND        [id number]   12/04/2023 Surgical Pathology CASE: [id number] PATIENT: Jadine Laraia Bone Marrow Report  Clinical History: Severe anemia eval  DIAGNOSIS:  BONE MARROW, ASPIRATE, CLOT, CORE: - Mildly hypercellular bone marrow (30%) with trilineage hematopoiesis and no increase in plasma cells.  See comment.  PERIPHERAL BLOOD: - Macrocytic anemia, thrombocytopenia  COMMENT: Morphologic evaluation of the bone marrow reveals a mildly hypercellular bone marrow.  Plasma cells are not increased (5% by manual aspirate differential and approximately 5% by CD138 immunohistochemical analysis).  Kappa/lambda ISH are polyclonal.  While mild dyspoiesis is identified, no significant dysplasia is identified in any of the lineages.  Occasional lymphoid aggregates comprised of small mature lymphocytes are identified in the clot section.  Immunohistochemical stains CD3 and CD20 are pending for further characterization.  Secondary causes for the patient's peripheral blood findings may be considered. Correlation with pending cytogenetics is recommended for further assessment.  A myeloid NGS panel may be requested if clinically indicated.  MICROSCOPIC DESCRIPTION:  PERIPHERAL BLOOD SMEAR: Platelets: Mildly decreased, no platelet clumps identified Erythroid: Macrocytosis Leukocytes: Adequate, occasional hypolobated neutrophils present, negative for blasts   BONE MARROW ASPIRATE: Cellular Erythroid precursors: Erythroid precursors show a full sequence of maturation with  occasional dyspoietic forms including budding and nuclear irregularities Granulocytic precursors: Granulocytic precursors show a full sequence of maturation, occasional hypolobated neutrophils present Megakaryocytes: Typical in number and morphology with occasional hypolobated forms Lymphocytes/plasma cells: Not increased  [facility]  PREPARATIONS: Cellular, confirmatory of aspirate findings  CLOT AND BIOPSY: The bone marrow clot and biopsy reveal a mildly hypercellular bone marrow (30%) with trilineage hematopoiesis.  No significant dysplasia is identified in any of the lineages.  Blasts are not increased.  Plasma cells do not appear increased.  The findings are confirmatory of the aspirate and touch prep impression.  SPECIAL STAINS: CD138: CD138 highlights plasma cells approximately 5% of cellularity Kappa/lambda ISH: Kappa/lambda ISH are polyclonal IRON STAIN: Iron stains are performed on a bone marrow aspirate or touch imprint smear and section of clot. The controls stained appropriately.       Storage Iron: Adequate      Ring Sideroblasts: Not identified  ADDITIONAL DATA/TESTING: Cytogenetics  CELL COUNT DATA:  Bone Marrow count performed on 500 cells shows: Blasts:   0%   Myeloid:  44% Promyelocytes: 0%   Erythroid:     42% Myelocytes:    6%   Lymphocytes:   9% Metamyelocytes:     2%   Plasma cells:  5% Bands:    1% Neutrophils:   30%  M:E ratio:     1.05 Eosinophils:   5% Basophils:     0% Monocytes:     0%  Lab Data: CBC performed on 12/04/2023 shows: WBC: 4.5 k/uL  Neutrophils:   50% Hgb: 8.4 g/dL  Lymphocytes:   60% HCT: 25.9 %    Monocytes:     7% MCV: 104 fL    Eosinophils:   4% RDW: 19.1 %    Basophils:     0% PLT: 141 k/uL    RADIOGRAPHIC STUDIES: I have personally reviewed the radiological images as listed and agreed with the findings in the report. IR IMAGING GUIDED PORT INSERTION Result Date: 03/04/2024 INDICATION: CHemotherapy for vidaza .  History of  myelodysplastic syndrome. EXAM: IMPLANTED PORT A CATH PLACEMENT WITH ULTRASOUND AND FLUOROSCOPIC GUIDANCE MEDICATIONS: None. ANESTHESIA/SEDATION: Moderate (conscious) sedation was employed during this procedure. A total of Versed  2 mg and Fentanyl  100 mcg was administered intravenously. Moderate Sedation Time: 28 minutes. The patient's level of consciousness and vital signs were monitored continuously by radiology nursing throughout the procedure under my direct supervision. FLUOROSCOPY: Radiation Exposure Index and estimated peak skin dose (PSD); Reference air kerma (RAK), 1 mGy. COMPLICATIONS: None immediate. PROCEDURE: The procedure, risks, benefits, and alternatives were explained to the patient. Questions regarding the procedure were encouraged and answered. The patient understands and consents to the procedure. The RIGHT neck and chest were prepped with chlorhexidine  in a sterile fashion, and a sterile drape was applied covering the operative field. Maximum barrier sterile technique with sterile gowns and gloves were used for the procedure. A timeout was performed prior to the initiation of the procedure. Local anesthesia was provided with 1% lidocaine  with epinephrine . After creating a small venotomy incision, a micropuncture kit was utilized to access the internal jugular vein under direct, real-time ultrasound guidance. Ultrasound image documentation was performed. The microwire was kinked to measure appropriate catheter length. A subcutaneous port pocket was then created along the upper chest wall utilizing a combination of sharp and blunt dissection. The pocket was irrigated with sterile saline. A single lumen power injectable port was chosen for placement. The 8 Fr catheter was tunneled from the port pocket site to the venotomy incision. The port was placed in the pocket. The external catheter was trimmed to appropriate length. At the venotomy, an 8 Fr peel-away sheath was placed over a guidewire under  fluoroscopic guidance. The catheter  was then placed through the sheath and the sheath was removed. Final catheter positioning was confirmed and documented with a fluoroscopic spot radiograph. The port was accessed with a Huber needle, aspirated and flushed with heparinized saline. The port pocket incision was closed with interrupted 3-0 Vicryl suture then Dermabond was applied, including at the venotomy incision. Dressings were placed. The patient tolerated the procedure well without immediate post procedural complication. IMPRESSION: 1. Extremely friable skin, suspect high risk for port erosion. 2. Successful placement of a RIGHT internal jugular approach power injectable Port-A-Cath. The tip of the catheter is positioned within the proximal RIGHT atrium. The catheter is ready for immediate use. Thom Hall, MD Vascular and [facility] Radiology Electronically Signed   By: Thom Hall M.D.   On: 03/04/2024 16:08   DG Chest Port 1 View Result Date: 01/05/2024 EXAM: 1 VIEW(S) XRAY OF THE CHEST 01/05/2024 02:09:00 PM COMPARISON: None available. CLINICAL HISTORY: Questionable sepsis - evaluate for abnormality FINDINGS: LUNGS AND PLEURA: Hazy opacity at left lung base, likely atelectasis. No pleural effusion. No pneumothorax. HEART AND MEDIASTINUM: Aortic atherosclerosis. No acute abnormality of the cardiac and mediastinal silhouettes. BONES AND SOFT TISSUES: Surgical clips in left breast. Degenerative changes of right shoulder. Left reverse total shoulder arthroplasty noted. IMPRESSION: 1. Hazy opacity at the left lung base, likely atelectasis. 2. Aortic Atherosclerosis (ICD10-I70.0). Electronically signed by: Ryan Chess MD 01/05/2024 02:33 PM EST RP Workstation: [id number]   US  Venous Img Lower  Left (DVT Study) Result Date: 12/24/2023 CLINICAL DATA:  Pain EXAM: LEFT LOWER EXTREMITY VENOUS DOPPLER ULTRASOUND TECHNIQUE: Gray-scale sonography with compression, as well as  color and duplex ultrasound, were performed to evaluate the deep venous system(s) from the level of the common femoral vein through the popliteal and proximal calf veins. COMPARISON:  None Available. FINDINGS: VENOUS Normal compressibility of the common femoral, superficial femoral, and popliteal veins, as well as the visualized calf veins. Visualized portions of profunda femoral vein and great saphenous vein unremarkable. No filling defects to suggest DVT on grayscale or color Doppler imaging. Doppler waveforms show normal direction of venous flow, normal respiratory plasticity and response to augmentation. Limited views of the contralateral common femoral vein are unremarkable. OTHER None. Limitations: none IMPRESSION: 1. No evidence of deep venous thrombosis within the left lower extremity. Electronically Signed   By: Ozell Daring M.D.   On: 12/24/2023 18:46   DG Foot Complete Left Result Date: 12/24/2023 EXAM: 3 OR MORE VIEW(S) XRAY OF THE LEFT FOOT 12/24/2023 05:46:00 PM COMPARISON: None available. CLINICAL HISTORY: [id number] Pain [id number] Pain FINDINGS: BONES AND JOINTS: No acute fracture. Surgical screw is seen in proximal first metatarsal. Surgical screw was also seen in distal second metatarsal. Moderate degenerative change is seen involving first metatarsophalangeal joint. No joint dislocation. SOFT TISSUES: Vascular calcifications are noted. IMPRESSION: 1. Moderate degenerative change involving the first metatarsophalangeal joint. 2. Surgical screws in the proximal first metatarsal and distal second metatarsal. 3. Vascular calcifications. Electronically signed by: Lynwood Seip MD 12/24/2023 06:00 PM EST RP Workstation: [id number]    ASSESSMENT & PLAN:  84 y.o. female with  1) Malignant neoplasm of central portion of left breast in female, estrogen receptor positive (HCC) 04/07/2017:left breast lumpectomy with sentinel lymph node sampling  pT1b pN0, stage IA invasive ductal carcinoma, grade II, with  negative margins, total 1 lymph node removed from the axilla, ER 100%, PR 70%, HER2 negative ratio 1.39, Ki-67 2% adjuvant radiation 05/10/2017 - 06/06/2017   Current treatment: Tamoxifen  started 03/17/2017  Plan of treatment: 10 years (patient preference)   Breast cancer surveillance: 1.  Breast MRI 11/11/2019: No evidence of malignancy  2. mammogram 06/02/2023: Benign breast density category C 3.  Breast exam 12/11/2023: Benign, radiation changes 4.  CT CAP 11/26/2023: No evidence of metastatic disease   2) MDS (myelodysplastic syndrome), high grade (HCC) Hospitalization 11/02/2023-11/03/2023: Closed C2 fracture Severe anemia: Previously 2 units of PRBC was given in the hospital B12: 895. Bone marrow biopsy: 12/04/2023: Flow cytometry: 7% abnormal blast population (positive for CD34, CD38, CD33, CD56, CD117 and HLA-DR), mildly hypercellular bone marrow 30% with trilineage hematopoiesis and no increase in plasma cells.   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  3) Hyperkalemia - Resolved as of 03/20/2024 -low potassium diet -maintain PO fluid intake of 64oz daily -rpt potassium levels in 1 wee -will need toi hold Kerendia - likely cause  PLAN:  - Discussed lab results on 03/20/2024 in detail with patient: CBC: WBC of 3.6K , decreased from 5.0K, RBC of 1.98 , decreased from 2.11, Hemoglobin of 6.7 , decreased from 7.3, PLTs of 42K , decreased from 78K, Neutrophils of 1.3K , decreased from 2.6K Significant anemia persisting  CMP: Glucose 125, increased from 101, Creatinine 1.19, decreased from 1.43, BUN 24, decreased from 25, Calcium  , decreased from 8.7   Hyperkalemia resolved. Kidney functions elevated.  Symptomatic anemia we will set her up for PRBC transfusion for hemoglobin less than 7 Platelet counts and as needed for platelet less than  20k No other notable toxicities from her current dose of Vidaza  Discussed infection precautions Will continue weekly labs at this time. Patient's question regarding her diagnosis and treatment plan and questions by her family were discussed in detail with her.  FOLLOW-UP  Portflush and labs weekly and PRBC transfusion prn for hgb<7 PLz schedule C2 of Vidaza  with MD visit, portflush and labs per integrated scheduling  .The total time spent in the appointment was 30 minutes* .  All of the patient's questions were answered with apparent satisfaction. The patient knows to call the clinic with any problems, questions or concerns.   Emaline Saran MD MS AAHIVMS [facility] Hematology/Oncology Physician [facility]  .*Total Encounter Time as defined by the Centers for Medicare and Medicaid Services includes, in addition to the face-to-face time of a patient visit (documented in the note above) non-face-to-face time: obtaining and reviewing outside history, ordering and reviewing medications, tests or procedures, care coordination (communications with other health care professionals or caregivers) and documentation in the medical record.  I,Emily Lagle,acting as a neurosurgeon for Emaline Saran, MD.,have documented all relevant documentation on the behalf of Emaline Saran, MD,as directed by  Emaline Saran, MD while in the presence of Emaline Saran, MD.  I have reviewed the above documentation for accuracy and completeness, and I agree with the above.  Emaline Saran, MD     [1]  Social History Tobacco Use   Smoking status: Former    Current packs/day: 0.00    Types: Cigarettes    Quit date: 02/22/1963    Years since quitting: 61.1    Passive exposure: Past   Smokeless tobacco: Never  Vaping Use   Vaping status: Never Used  Substance Use Topics   Alcohol use: Yes    Alcohol/week: 14.0 standard drinks of alcohol    Types: 14 Glasses of wine per week  Comment: 2 glasses of wine daily.    Drug  use: No   "

## 2024-03-22 LAB — TYPE AND SCREEN
ABO/RH(D): O POS
Antibody Screen: NEGATIVE
Unit division: 0

## 2024-03-22 LAB — BPAM RBC
Blood Product Expiration Date: 202602232359
ISSUE DATE / TIME: 202601290940
Unit Type and Rh: 5100

## 2024-03-27 ENCOUNTER — Other Ambulatory Visit: Payer: Self-pay

## 2024-03-27 ENCOUNTER — Encounter: Payer: Self-pay | Admitting: Hematology

## 2024-03-27 ENCOUNTER — Inpatient Hospital Stay: Attending: Hematology and Oncology

## 2024-03-27 DIAGNOSIS — D649 Anemia, unspecified: Secondary | ICD-10-CM

## 2024-03-27 DIAGNOSIS — D46Z Other myelodysplastic syndromes: Secondary | ICD-10-CM

## 2024-03-27 LAB — CBC WITH DIFFERENTIAL (CANCER CENTER ONLY)
Abs Immature Granulocytes: 0.05 10*3/uL (ref 0.00–0.07)
Basophils Absolute: 0 10*3/uL (ref 0.0–0.1)
Basophils Relative: 1 %
Eosinophils Absolute: 0.1 10*3/uL (ref 0.0–0.5)
Eosinophils Relative: 1 %
HCT: 21 % — ABNORMAL LOW (ref 36.0–46.0)
Hemoglobin: 7.1 g/dL — ABNORMAL LOW (ref 12.0–15.0)
Immature Granulocytes: 1 %
Lymphocytes Relative: 53 %
Lymphs Abs: 1.9 10*3/uL (ref 0.7–4.0)
MCH: 33.6 pg (ref 26.0–34.0)
MCHC: 33.8 g/dL (ref 30.0–36.0)
MCV: 99.5 fL (ref 80.0–100.0)
Monocytes Absolute: 0.5 10*3/uL (ref 0.1–1.0)
Monocytes Relative: 13 %
Neutro Abs: 1.1 10*3/uL — ABNORMAL LOW (ref 1.7–7.7)
Neutrophils Relative %: 31 %
Platelet Count: 30 10*3/uL — ABNORMAL LOW (ref 150–400)
RBC: 2.11 MIL/uL — ABNORMAL LOW (ref 3.87–5.11)
RDW: 17 % — ABNORMAL HIGH (ref 11.5–15.5)
WBC Count: 3.6 10*3/uL — ABNORMAL LOW (ref 4.0–10.5)
nRBC: 0 % (ref 0.0–0.2)

## 2024-03-27 LAB — CMP (CANCER CENTER ONLY)
ALT: 6 U/L (ref 0–44)
AST: 14 U/L — ABNORMAL LOW (ref 15–41)
Albumin: 4.1 g/dL (ref 3.5–5.0)
Alkaline Phosphatase: 47 U/L (ref 38–126)
Anion gap: 10 (ref 5–15)
BUN: 31 mg/dL — ABNORMAL HIGH (ref 8–23)
CO2: 23 mmol/L (ref 22–32)
Calcium: 8.5 mg/dL — ABNORMAL LOW (ref 8.9–10.3)
Chloride: 106 mmol/L (ref 98–111)
Creatinine: 1.2 mg/dL — ABNORMAL HIGH (ref 0.44–1.00)
GFR, Estimated: 44 mL/min — ABNORMAL LOW
Glucose, Bld: 95 mg/dL (ref 70–99)
Potassium: 5.5 mmol/L — ABNORMAL HIGH (ref 3.5–5.1)
Sodium: 139 mmol/L (ref 135–145)
Total Bilirubin: 0.5 mg/dL (ref 0.0–1.2)
Total Protein: 6.8 g/dL (ref 6.5–8.1)

## 2024-03-27 LAB — PREPARE RBC (CROSSMATCH)

## 2024-03-27 LAB — SAMPLE TO BLOOD BANK

## 2024-03-28 ENCOUNTER — Inpatient Hospital Stay

## 2024-03-29 ENCOUNTER — Inpatient Hospital Stay

## 2024-03-29 DIAGNOSIS — D46Z Other myelodysplastic syndromes: Secondary | ICD-10-CM

## 2024-03-29 DIAGNOSIS — D649 Anemia, unspecified: Secondary | ICD-10-CM

## 2024-03-29 LAB — BPAM RBC
Blood Product Expiration Date: 202603032359
ISSUE DATE / TIME: 202602061048
Unit Type and Rh: 202603032359
Unit Type and Rh: 5100

## 2024-03-29 LAB — TYPE AND SCREEN
ABO/RH(D): O POS
Antibody Screen: NEGATIVE
Unit division: 0

## 2024-03-29 MED ORDER — METHYLPREDNISOLONE SODIUM SUCC 125 MG IJ SOLR
40.0000 mg | Freq: Once | INTRAMUSCULAR | Status: AC
  Administered 2024-03-29: 40 mg via INTRAVENOUS
  Filled 2024-03-29: qty 2

## 2024-03-29 MED ORDER — SODIUM CHLORIDE 0.9% IV SOLUTION
250.0000 mL | INTRAVENOUS | Status: DC
  Administered 2024-03-29: 100 mL via INTRAVENOUS

## 2024-03-29 MED ORDER — SODIUM CHLORIDE 0.9% FLUSH: 10.0000 mL | INTRAVENOUS | Status: DC | PRN

## 2024-03-29 MED ORDER — ACETAMINOPHEN 325 MG PO TABS
650.0000 mg | ORAL_TABLET | Freq: Once | ORAL | Status: AC
  Administered 2024-03-29: 650 mg via ORAL
  Filled 2024-03-29: qty 2

## 2024-03-30 ENCOUNTER — Inpatient Hospital Stay

## 2024-04-08 ENCOUNTER — Inpatient Hospital Stay: Admitting: Hematology

## 2024-04-08 ENCOUNTER — Inpatient Hospital Stay

## 2024-04-09 ENCOUNTER — Inpatient Hospital Stay

## 2024-04-10 ENCOUNTER — Inpatient Hospital Stay

## 2024-04-11 ENCOUNTER — Inpatient Hospital Stay

## 2024-04-12 ENCOUNTER — Inpatient Hospital Stay

## 2024-05-09 ENCOUNTER — Ambulatory Visit: Admitting: Hematology and Oncology
